# Patient Record
Sex: Female | Born: 1945
Health system: Southern US, Community
[De-identification: ages and names within clinical notes are randomized; demographics above are authoritative.]

## PROBLEM LIST (undated history)

## (undated) DIAGNOSIS — I2510 Atherosclerotic heart disease of native coronary artery without angina pectoris: Secondary | ICD-10-CM

## (undated) DIAGNOSIS — I4719 Other supraventricular tachycardia: Secondary | ICD-10-CM

## (undated) DIAGNOSIS — J449 Chronic obstructive pulmonary disease, unspecified: Secondary | ICD-10-CM

## (undated) DIAGNOSIS — I5042 Chronic combined systolic (congestive) and diastolic (congestive) heart failure: Secondary | ICD-10-CM

## (undated) DIAGNOSIS — I428 Other cardiomyopathies: Secondary | ICD-10-CM

## (undated) DIAGNOSIS — I340 Nonrheumatic mitral (valve) insufficiency: Secondary | ICD-10-CM

## (undated) DIAGNOSIS — I10 Essential (primary) hypertension: Secondary | ICD-10-CM

## (undated) DIAGNOSIS — I471 Supraventricular tachycardia: Secondary | ICD-10-CM

## (undated) DIAGNOSIS — R911 Solitary pulmonary nodule: Secondary | ICD-10-CM

## (undated) DIAGNOSIS — J45909 Unspecified asthma, uncomplicated: Secondary | ICD-10-CM

## (undated) HISTORY — DX: Supraventricular tachycardia: I47.1

## (undated) HISTORY — DX: Other supraventricular tachycardia: I47.19

## (undated) HISTORY — DX: Other cardiomyopathies: I42.8

## (undated) HISTORY — PX: REPLACEMENT TOTAL KNEE: SUR1224

## (undated) HISTORY — DX: Atherosclerotic heart disease of native coronary artery without angina pectoris: I25.10

## (undated) HISTORY — DX: Nonrheumatic mitral (valve) insufficiency: I34.0

## (undated) HISTORY — DX: Chronic combined systolic (congestive) and diastolic (congestive) heart failure: I50.42

---

## 2011-09-07 DIAGNOSIS — I8310 Varicose veins of unspecified lower extremity with inflammation: Secondary | ICD-10-CM | POA: Insufficient documentation

## 2011-10-11 DIAGNOSIS — G4700 Insomnia, unspecified: Secondary | ICD-10-CM | POA: Diagnosis not present

## 2011-10-11 DIAGNOSIS — I10 Essential (primary) hypertension: Secondary | ICD-10-CM | POA: Diagnosis not present

## 2011-10-11 DIAGNOSIS — R0602 Shortness of breath: Secondary | ICD-10-CM | POA: Diagnosis not present

## 2011-10-11 DIAGNOSIS — F329 Major depressive disorder, single episode, unspecified: Secondary | ICD-10-CM | POA: Diagnosis not present

## 2011-10-11 DIAGNOSIS — I5030 Unspecified diastolic (congestive) heart failure: Secondary | ICD-10-CM | POA: Diagnosis not present

## 2011-10-11 DIAGNOSIS — F411 Generalized anxiety disorder: Secondary | ICD-10-CM | POA: Diagnosis not present

## 2011-10-11 DIAGNOSIS — J441 Chronic obstructive pulmonary disease with (acute) exacerbation: Secondary | ICD-10-CM | POA: Diagnosis not present

## 2011-10-11 DIAGNOSIS — R0600 Dyspnea, unspecified: Secondary | ICD-10-CM | POA: Insufficient documentation

## 2011-10-12 DIAGNOSIS — E876 Hypokalemia: Secondary | ICD-10-CM | POA: Diagnosis not present

## 2011-10-12 DIAGNOSIS — F411 Generalized anxiety disorder: Secondary | ICD-10-CM | POA: Diagnosis not present

## 2011-10-12 DIAGNOSIS — I10 Essential (primary) hypertension: Secondary | ICD-10-CM | POA: Diagnosis not present

## 2011-10-12 DIAGNOSIS — G4700 Insomnia, unspecified: Secondary | ICD-10-CM | POA: Diagnosis not present

## 2011-10-12 DIAGNOSIS — F329 Major depressive disorder, single episode, unspecified: Secondary | ICD-10-CM | POA: Diagnosis not present

## 2011-10-12 DIAGNOSIS — I5032 Chronic diastolic (congestive) heart failure: Secondary | ICD-10-CM | POA: Diagnosis not present

## 2011-10-12 DIAGNOSIS — J441 Chronic obstructive pulmonary disease with (acute) exacerbation: Secondary | ICD-10-CM | POA: Diagnosis not present

## 2011-10-12 DIAGNOSIS — J9600 Acute respiratory failure, unspecified whether with hypoxia or hypercapnia: Secondary | ICD-10-CM | POA: Diagnosis not present

## 2011-10-13 DIAGNOSIS — F411 Generalized anxiety disorder: Secondary | ICD-10-CM | POA: Diagnosis not present

## 2011-10-13 DIAGNOSIS — J984 Other disorders of lung: Secondary | ICD-10-CM | POA: Diagnosis not present

## 2011-10-13 DIAGNOSIS — F17200 Nicotine dependence, unspecified, uncomplicated: Secondary | ICD-10-CM | POA: Diagnosis not present

## 2011-10-13 DIAGNOSIS — I5032 Chronic diastolic (congestive) heart failure: Secondary | ICD-10-CM | POA: Diagnosis not present

## 2011-10-13 DIAGNOSIS — G4700 Insomnia, unspecified: Secondary | ICD-10-CM | POA: Diagnosis not present

## 2011-10-13 DIAGNOSIS — F329 Major depressive disorder, single episode, unspecified: Secondary | ICD-10-CM | POA: Diagnosis not present

## 2011-10-13 DIAGNOSIS — I10 Essential (primary) hypertension: Secondary | ICD-10-CM | POA: Diagnosis not present

## 2011-10-13 DIAGNOSIS — J441 Chronic obstructive pulmonary disease with (acute) exacerbation: Secondary | ICD-10-CM | POA: Diagnosis not present

## 2011-11-20 DIAGNOSIS — J019 Acute sinusitis, unspecified: Secondary | ICD-10-CM | POA: Diagnosis not present

## 2011-11-20 DIAGNOSIS — J449 Chronic obstructive pulmonary disease, unspecified: Secondary | ICD-10-CM | POA: Diagnosis not present

## 2012-01-13 DIAGNOSIS — J449 Chronic obstructive pulmonary disease, unspecified: Secondary | ICD-10-CM | POA: Diagnosis not present

## 2012-01-13 DIAGNOSIS — I8310 Varicose veins of unspecified lower extremity with inflammation: Secondary | ICD-10-CM | POA: Diagnosis not present

## 2012-01-13 DIAGNOSIS — I509 Heart failure, unspecified: Secondary | ICD-10-CM | POA: Diagnosis not present

## 2012-02-01 DIAGNOSIS — M25559 Pain in unspecified hip: Secondary | ICD-10-CM | POA: Diagnosis not present

## 2012-02-01 DIAGNOSIS — M25469 Effusion, unspecified knee: Secondary | ICD-10-CM | POA: Diagnosis not present

## 2012-02-01 DIAGNOSIS — M5137 Other intervertebral disc degeneration, lumbosacral region: Secondary | ICD-10-CM | POA: Diagnosis not present

## 2012-02-01 DIAGNOSIS — M7989 Other specified soft tissue disorders: Secondary | ICD-10-CM | POA: Diagnosis not present

## 2012-02-01 DIAGNOSIS — R52 Pain, unspecified: Secondary | ICD-10-CM | POA: Diagnosis not present

## 2012-02-01 DIAGNOSIS — M47817 Spondylosis without myelopathy or radiculopathy, lumbosacral region: Secondary | ICD-10-CM | POA: Diagnosis not present

## 2012-02-01 DIAGNOSIS — M1710 Unilateral primary osteoarthritis, unspecified knee: Secondary | ICD-10-CM | POA: Diagnosis not present

## 2012-02-01 DIAGNOSIS — M25569 Pain in unspecified knee: Secondary | ICD-10-CM | POA: Diagnosis not present

## 2012-02-01 DIAGNOSIS — IMO0002 Reserved for concepts with insufficient information to code with codable children: Secondary | ICD-10-CM | POA: Diagnosis not present

## 2012-02-01 DIAGNOSIS — M25869 Other specified joint disorders, unspecified knee: Secondary | ICD-10-CM | POA: Diagnosis not present

## 2012-02-01 DIAGNOSIS — M51379 Other intervertebral disc degeneration, lumbosacral region without mention of lumbar back pain or lower extremity pain: Secondary | ICD-10-CM | POA: Diagnosis not present

## 2012-02-01 DIAGNOSIS — M79609 Pain in unspecified limb: Secondary | ICD-10-CM | POA: Diagnosis not present

## 2012-02-10 DIAGNOSIS — H6980 Other specified disorders of Eustachian tube, unspecified ear: Secondary | ICD-10-CM | POA: Diagnosis not present

## 2012-05-11 DIAGNOSIS — F411 Generalized anxiety disorder: Secondary | ICD-10-CM | POA: Diagnosis not present

## 2012-05-11 DIAGNOSIS — R05 Cough: Secondary | ICD-10-CM | POA: Diagnosis not present

## 2012-05-11 DIAGNOSIS — F17200 Nicotine dependence, unspecified, uncomplicated: Secondary | ICD-10-CM | POA: Diagnosis not present

## 2012-05-11 DIAGNOSIS — I509 Heart failure, unspecified: Secondary | ICD-10-CM | POA: Diagnosis not present

## 2012-05-11 DIAGNOSIS — Z79899 Other long term (current) drug therapy: Secondary | ICD-10-CM | POA: Diagnosis not present

## 2012-05-11 DIAGNOSIS — J449 Chronic obstructive pulmonary disease, unspecified: Secondary | ICD-10-CM | POA: Diagnosis not present

## 2012-06-28 DIAGNOSIS — Z23 Encounter for immunization: Secondary | ICD-10-CM | POA: Diagnosis not present

## 2012-09-14 DIAGNOSIS — F17200 Nicotine dependence, unspecified, uncomplicated: Secondary | ICD-10-CM | POA: Diagnosis not present

## 2012-09-14 DIAGNOSIS — J449 Chronic obstructive pulmonary disease, unspecified: Secondary | ICD-10-CM | POA: Diagnosis not present

## 2012-09-14 DIAGNOSIS — F411 Generalized anxiety disorder: Secondary | ICD-10-CM | POA: Diagnosis not present

## 2012-09-14 DIAGNOSIS — J069 Acute upper respiratory infection, unspecified: Secondary | ICD-10-CM | POA: Diagnosis not present

## 2012-09-14 DIAGNOSIS — I509 Heart failure, unspecified: Secondary | ICD-10-CM | POA: Diagnosis not present

## 2012-09-14 DIAGNOSIS — R5381 Other malaise: Secondary | ICD-10-CM | POA: Diagnosis not present

## 2012-09-14 DIAGNOSIS — K219 Gastro-esophageal reflux disease without esophagitis: Secondary | ICD-10-CM | POA: Diagnosis not present

## 2013-06-13 DIAGNOSIS — Z23 Encounter for immunization: Secondary | ICD-10-CM | POA: Diagnosis not present

## 2013-10-22 ENCOUNTER — Inpatient Hospital Stay: Payer: Self-pay | Admitting: Internal Medicine

## 2013-10-22 DIAGNOSIS — R5383 Other fatigue: Secondary | ICD-10-CM | POA: Diagnosis not present

## 2013-10-22 DIAGNOSIS — I10 Essential (primary) hypertension: Secondary | ICD-10-CM | POA: Diagnosis not present

## 2013-10-22 DIAGNOSIS — J9600 Acute respiratory failure, unspecified whether with hypoxia or hypercapnia: Secondary | ICD-10-CM | POA: Diagnosis not present

## 2013-10-22 DIAGNOSIS — F17200 Nicotine dependence, unspecified, uncomplicated: Secondary | ICD-10-CM | POA: Diagnosis not present

## 2013-10-22 DIAGNOSIS — R5381 Other malaise: Secondary | ICD-10-CM | POA: Diagnosis not present

## 2013-10-22 DIAGNOSIS — J441 Chronic obstructive pulmonary disease with (acute) exacerbation: Secondary | ICD-10-CM | POA: Diagnosis not present

## 2013-10-22 LAB — URINALYSIS, COMPLETE
Bacteria: NONE SEEN
Bilirubin,UR: NEGATIVE
Glucose,UR: NEGATIVE mg/dL (ref 0–75)
KETONE: NEGATIVE
Leukocyte Esterase: NEGATIVE
NITRITE: NEGATIVE
Ph: 6 (ref 4.5–8.0)
Protein: NEGATIVE
RBC,UR: 12 /HPF (ref 0–5)
Specific Gravity: 1.003 (ref 1.003–1.030)
Squamous Epithelial: 1
WBC UR: NONE SEEN /HPF (ref 0–5)

## 2013-10-22 LAB — CBC
HCT: 46.4 % (ref 35.0–47.0)
HGB: 15.2 g/dL (ref 12.0–16.0)
MCH: 29 pg (ref 26.0–34.0)
MCHC: 32.8 g/dL (ref 32.0–36.0)
MCV: 89 fL (ref 80–100)
Platelet: 123 10*3/uL — ABNORMAL LOW (ref 150–440)
RBC: 5.24 10*6/uL — AB (ref 3.80–5.20)
RDW: 14.6 % — ABNORMAL HIGH (ref 11.5–14.5)
WBC: 3.4 10*3/uL — AB (ref 3.6–11.0)

## 2013-10-22 LAB — BASIC METABOLIC PANEL
ANION GAP: 8 (ref 7–16)
BUN: 11 mg/dL (ref 7–18)
CALCIUM: 8.4 mg/dL — AB (ref 8.5–10.1)
CHLORIDE: 106 mmol/L (ref 98–107)
Co2: 26 mmol/L (ref 21–32)
Creatinine: 0.97 mg/dL (ref 0.60–1.30)
EGFR (Non-African Amer.): 60 — ABNORMAL LOW
Glucose: 95 mg/dL (ref 65–99)
OSMOLALITY: 279 (ref 275–301)
Potassium: 3.3 mmol/L — ABNORMAL LOW (ref 3.5–5.1)
Sodium: 140 mmol/L (ref 136–145)

## 2013-10-22 LAB — TROPONIN I: Troponin-I: <0.02 ng/mL

## 2013-10-23 DIAGNOSIS — J441 Chronic obstructive pulmonary disease with (acute) exacerbation: Secondary | ICD-10-CM | POA: Diagnosis not present

## 2013-10-23 DIAGNOSIS — J9600 Acute respiratory failure, unspecified whether with hypoxia or hypercapnia: Secondary | ICD-10-CM | POA: Diagnosis not present

## 2013-10-23 DIAGNOSIS — F17200 Nicotine dependence, unspecified, uncomplicated: Secondary | ICD-10-CM | POA: Diagnosis not present

## 2013-10-23 DIAGNOSIS — I10 Essential (primary) hypertension: Secondary | ICD-10-CM | POA: Diagnosis not present

## 2013-10-23 LAB — CBC WITH DIFFERENTIAL/PLATELET
Basophil #: 0 10*3/uL (ref 0.0–0.1)
Basophil %: 0.4 %
EOS ABS: 0 10*3/uL (ref 0.0–0.7)
Eosinophil %: 0 %
HCT: 42.1 % (ref 35.0–47.0)
HGB: 13.9 g/dL (ref 12.0–16.0)
LYMPHS ABS: 0.2 10*3/uL — AB (ref 1.0–3.6)
LYMPHS PCT: 11.4 %
MCH: 29.3 pg (ref 26.0–34.0)
MCHC: 33 g/dL (ref 32.0–36.0)
MCV: 89 fL (ref 80–100)
Monocyte #: 0.1 x10 3/mm — ABNORMAL LOW (ref 0.2–0.9)
Monocyte %: 3.4 %
Neutrophil #: 1.5 10*3/uL (ref 1.4–6.5)
Neutrophil %: 84.8 %
Platelet: 95 10*3/uL — ABNORMAL LOW (ref 150–440)
RBC: 4.74 10*6/uL (ref 3.80–5.20)
RDW: 14.2 % (ref 11.5–14.5)
WBC: 1.8 10*3/uL — AB (ref 3.6–11.0)

## 2013-10-23 LAB — BASIC METABOLIC PANEL
Anion Gap: 11 (ref 7–16)
BUN: 17 mg/dL (ref 7–18)
CALCIUM: 8.4 mg/dL — AB (ref 8.5–10.1)
Chloride: 105 mmol/L (ref 98–107)
Co2: 22 mmol/L (ref 21–32)
Creatinine: 1.13 mg/dL (ref 0.60–1.30)
GFR CALC AF AMER: 57 — AB
GFR CALC NON AF AMER: 50 — AB
GLUCOSE: 198 mg/dL — AB (ref 65–99)
OSMOLALITY: 283 (ref 275–301)
Potassium: 3.2 mmol/L — ABNORMAL LOW (ref 3.5–5.1)
SODIUM: 138 mmol/L (ref 136–145)

## 2013-10-24 LAB — CBC WITH DIFFERENTIAL/PLATELET
Basophil #: 0.1 10*3/uL (ref 0.0–0.1)
Basophil %: 0.6 %
EOS ABS: 0 10*3/uL (ref 0.0–0.7)
EOS PCT: 0 %
HCT: 44.7 % (ref 35.0–47.0)
HGB: 14.7 g/dL (ref 12.0–16.0)
LYMPHS ABS: 0.2 10*3/uL — AB (ref 1.0–3.6)
LYMPHS PCT: 2.3 %
MCH: 29.3 pg (ref 26.0–34.0)
MCHC: 32.9 g/dL (ref 32.0–36.0)
MCV: 89 fL (ref 80–100)
Monocyte #: 0.2 x10 3/mm (ref 0.2–0.9)
Monocyte %: 1.9 %
NEUTROS ABS: 9.8 10*3/uL — AB (ref 1.4–6.5)
Neutrophil %: 95.2 %
Platelet: 133 10*3/uL — ABNORMAL LOW (ref 150–440)
RBC: 5.01 10*6/uL (ref 3.80–5.20)
RDW: 14.6 % — ABNORMAL HIGH (ref 11.5–14.5)
WBC: 10.3 10*3/uL (ref 3.6–11.0)

## 2013-10-24 LAB — BASIC METABOLIC PANEL
Anion Gap: 6 — ABNORMAL LOW (ref 7–16)
BUN: 18 mg/dL (ref 7–18)
CO2: 26 mmol/L (ref 21–32)
Calcium, Total: 8.4 mg/dL — ABNORMAL LOW (ref 8.5–10.1)
Chloride: 107 mmol/L (ref 98–107)
Creatinine: 0.96 mg/dL (ref 0.60–1.30)
Glucose: 170 mg/dL — ABNORMAL HIGH (ref 65–99)
OSMOLALITY: 283 (ref 275–301)
Potassium: 4 mmol/L (ref 3.5–5.1)
Sodium: 139 mmol/L (ref 136–145)

## 2013-10-28 LAB — CULTURE, BLOOD (SINGLE)

## 2013-11-13 ENCOUNTER — Inpatient Hospital Stay: Payer: Self-pay | Admitting: Family Medicine

## 2013-11-13 DIAGNOSIS — R059 Cough, unspecified: Secondary | ICD-10-CM | POA: Diagnosis not present

## 2013-11-13 DIAGNOSIS — R52 Pain, unspecified: Secondary | ICD-10-CM | POA: Diagnosis not present

## 2013-11-13 DIAGNOSIS — I959 Hypotension, unspecified: Secondary | ICD-10-CM | POA: Diagnosis not present

## 2013-11-13 DIAGNOSIS — J189 Pneumonia, unspecified organism: Secondary | ICD-10-CM | POA: Diagnosis not present

## 2013-11-13 DIAGNOSIS — J984 Other disorders of lung: Secondary | ICD-10-CM | POA: Diagnosis not present

## 2013-11-13 DIAGNOSIS — R5383 Other fatigue: Secondary | ICD-10-CM | POA: Diagnosis not present

## 2013-11-13 DIAGNOSIS — J441 Chronic obstructive pulmonary disease with (acute) exacerbation: Secondary | ICD-10-CM | POA: Diagnosis not present

## 2013-11-13 DIAGNOSIS — R6510 Systemic inflammatory response syndrome (SIRS) of non-infectious origin without acute organ dysfunction: Secondary | ICD-10-CM | POA: Diagnosis not present

## 2013-11-13 DIAGNOSIS — F17200 Nicotine dependence, unspecified, uncomplicated: Secondary | ICD-10-CM | POA: Diagnosis not present

## 2013-11-13 LAB — HEPATIC FUNCTION PANEL A (ARMC)
ALBUMIN: 2.3 g/dL — AB (ref 3.4–5.0)
AST: 8 U/L — AB (ref 15–37)
Alkaline Phosphatase: 70 U/L
BILIRUBIN TOTAL: 0.8 mg/dL (ref 0.2–1.0)
Bilirubin, Direct: 0.2 mg/dL (ref 0.00–0.20)
SGPT (ALT): 10 U/L — ABNORMAL LOW (ref 12–78)
TOTAL PROTEIN: 6 g/dL — AB (ref 6.4–8.2)

## 2013-11-13 LAB — URINALYSIS, COMPLETE
BILIRUBIN, UR: NEGATIVE
Bacteria: NONE SEEN
GLUCOSE, UR: NEGATIVE mg/dL (ref 0–75)
Ketone: NEGATIVE
LEUKOCYTE ESTERASE: NEGATIVE
Nitrite: NEGATIVE
PH: 5 (ref 4.5–8.0)
PROTEIN: NEGATIVE
SPECIFIC GRAVITY: 1.012 (ref 1.003–1.030)
Squamous Epithelial: <1
WBC UR: 1 /HPF (ref 0–5)

## 2013-11-13 LAB — CK TOTAL AND CKMB (NOT AT ARMC)
CK, Total: 80 U/L
CK, Total: 73 U/L
CK-MB: 0.6 ng/mL (ref 0.5–3.6)
CK-MB: 1 ng/mL (ref 0.5–3.6)

## 2013-11-13 LAB — BASIC METABOLIC PANEL
Anion Gap: 6 — ABNORMAL LOW (ref 7–16)
BUN: 15 mg/dL (ref 7–18)
CO2: 26 mmol/L (ref 21–32)
Calcium, Total: 7.3 mg/dL — ABNORMAL LOW (ref 8.5–10.1)
Chloride: 109 mmol/L — ABNORMAL HIGH (ref 98–107)
Creatinine: 1.01 mg/dL (ref 0.60–1.30)
EGFR (African American): >60
GFR CALC NON AF AMER: 57 — AB
Glucose: 117 mg/dL — ABNORMAL HIGH (ref 65–99)
Osmolality: 283 (ref 275–301)
Potassium: 3.5 mmol/L (ref 3.5–5.1)
SODIUM: 141 mmol/L (ref 136–145)

## 2013-11-13 LAB — PROTIME-INR
INR: 1.2
Prothrombin Time: 15.1 secs — ABNORMAL HIGH (ref 11.5–14.7)

## 2013-11-13 LAB — CBC WITH DIFFERENTIAL/PLATELET
BASOS PCT: 0.4 %
Basophil #: 0 10*3/uL (ref 0.0–0.1)
Eosinophil #: 0 10*3/uL (ref 0.0–0.7)
Eosinophil %: 0.3 %
HCT: 37.4 % (ref 35.0–47.0)
HGB: 12.3 g/dL (ref 12.0–16.0)
LYMPHS ABS: 1 10*3/uL (ref 1.0–3.6)
Lymphocyte %: 10 %
MCH: 28.9 pg (ref 26.0–34.0)
MCHC: 32.8 g/dL (ref 32.0–36.0)
MCV: 88 fL (ref 80–100)
MONOS PCT: 7.6 %
Monocyte #: 0.7 x10 3/mm (ref 0.2–0.9)
Neutrophil #: 8 10*3/uL — ABNORMAL HIGH (ref 1.4–6.5)
Neutrophil %: 81.7 %
Platelet: 169 10*3/uL (ref 150–440)
RBC: 4.24 10*6/uL (ref 3.80–5.20)
RDW: 14.1 % (ref 11.5–14.5)
WBC: 9.8 10*3/uL (ref 3.6–11.0)

## 2013-11-13 LAB — TROPONIN I

## 2013-11-13 LAB — APTT: ACTIVATED PTT: 29 s (ref 23.6–35.9)

## 2013-11-13 LAB — HEMOGLOBIN A1C: Hemoglobin A1C: 6.1 % (ref 4.2–6.3)

## 2013-11-14 DIAGNOSIS — F17200 Nicotine dependence, unspecified, uncomplicated: Secondary | ICD-10-CM | POA: Diagnosis not present

## 2013-11-14 DIAGNOSIS — R6510 Systemic inflammatory response syndrome (SIRS) of non-infectious origin without acute organ dysfunction: Secondary | ICD-10-CM | POA: Diagnosis not present

## 2013-11-14 DIAGNOSIS — A779 Spotted fever, unspecified: Secondary | ICD-10-CM | POA: Diagnosis not present

## 2013-11-14 DIAGNOSIS — I959 Hypotension, unspecified: Secondary | ICD-10-CM | POA: Diagnosis not present

## 2013-11-14 DIAGNOSIS — I059 Rheumatic mitral valve disease, unspecified: Secondary | ICD-10-CM

## 2013-11-14 DIAGNOSIS — J189 Pneumonia, unspecified organism: Secondary | ICD-10-CM | POA: Diagnosis not present

## 2013-11-14 LAB — CBC WITH DIFFERENTIAL/PLATELET
BASOS ABS: 0 10*3/uL (ref 0.0–0.1)
Basophil %: 0.2 %
EOS PCT: 0 %
Eosinophil #: 0 10*3/uL (ref 0.0–0.7)
HCT: 35.8 % (ref 35.0–47.0)
HGB: 12 g/dL (ref 12.0–16.0)
Lymphocyte #: 0.4 10*3/uL — ABNORMAL LOW (ref 1.0–3.6)
Lymphocyte %: 7 %
MCH: 29.3 pg (ref 26.0–34.0)
MCHC: 33.6 g/dL (ref 32.0–36.0)
MCV: 87 fL (ref 80–100)
Monocyte #: 0.1 x10 3/mm — ABNORMAL LOW (ref 0.2–0.9)
Monocyte %: 1.9 %
Neutrophil #: 5.5 10*3/uL (ref 1.4–6.5)
Neutrophil %: 90.9 %
Platelet: 161 10*3/uL (ref 150–440)
RBC: 4.11 10*6/uL (ref 3.80–5.20)
RDW: 13.7 % (ref 11.5–14.5)
WBC: 6 10*3/uL (ref 3.6–11.0)

## 2013-11-14 LAB — COMPREHENSIVE METABOLIC PANEL
ALT: 11 U/L — AB (ref 12–78)
ANION GAP: 9 (ref 7–16)
AST: 11 U/L — AB (ref 15–37)
Albumin: 2.4 g/dL — ABNORMAL LOW (ref 3.4–5.0)
Alkaline Phosphatase: 74 U/L
BUN: 12 mg/dL (ref 7–18)
Bilirubin,Total: 0.5 mg/dL (ref 0.2–1.0)
CREATININE: 0.84 mg/dL (ref 0.60–1.30)
Calcium, Total: 7.9 mg/dL — ABNORMAL LOW (ref 8.5–10.1)
Chloride: 110 mmol/L — ABNORMAL HIGH (ref 98–107)
Co2: 22 mmol/L (ref 21–32)
EGFR (African American): >60
EGFR (Non-African Amer.): >60
GLUCOSE: 143 mg/dL — AB (ref 65–99)
Osmolality: 283 (ref 275–301)
Potassium: 3.6 mmol/L (ref 3.5–5.1)
Sodium: 141 mmol/L (ref 136–145)
TOTAL PROTEIN: 6.1 g/dL — AB (ref 6.4–8.2)

## 2013-11-15 DIAGNOSIS — A779 Spotted fever, unspecified: Secondary | ICD-10-CM | POA: Diagnosis not present

## 2013-11-15 DIAGNOSIS — F17200 Nicotine dependence, unspecified, uncomplicated: Secondary | ICD-10-CM | POA: Diagnosis not present

## 2013-11-15 DIAGNOSIS — R6510 Systemic inflammatory response syndrome (SIRS) of non-infectious origin without acute organ dysfunction: Secondary | ICD-10-CM | POA: Diagnosis not present

## 2013-11-15 DIAGNOSIS — J189 Pneumonia, unspecified organism: Secondary | ICD-10-CM | POA: Diagnosis not present

## 2013-11-15 DIAGNOSIS — I959 Hypotension, unspecified: Secondary | ICD-10-CM | POA: Diagnosis not present

## 2013-11-15 LAB — CBC WITH DIFFERENTIAL/PLATELET
BASOS ABS: 0 10*3/uL (ref 0.0–0.1)
Basophil %: 0.2 %
Eosinophil #: 0 10*3/uL (ref 0.0–0.7)
Eosinophil %: 0 %
HCT: 34.8 % — AB (ref 35.0–47.0)
HGB: 11.4 g/dL — ABNORMAL LOW (ref 12.0–16.0)
Lymphocyte #: 0.5 10*3/uL — ABNORMAL LOW (ref 1.0–3.6)
Lymphocyte %: 7.4 %
MCH: 28.7 pg (ref 26.0–34.0)
MCHC: 32.6 g/dL (ref 32.0–36.0)
MCV: 88 fL (ref 80–100)
MONO ABS: 0.2 x10 3/mm (ref 0.2–0.9)
MONOS PCT: 2.7 %
NEUTROS ABS: 5.5 10*3/uL (ref 1.4–6.5)
Neutrophil %: 89.7 %
Platelet: 159 10*3/uL (ref 150–440)
RBC: 3.96 10*6/uL (ref 3.80–5.20)
RDW: 13.8 % (ref 11.5–14.5)
WBC: 6.2 10*3/uL (ref 3.6–11.0)

## 2013-11-15 LAB — BASIC METABOLIC PANEL
Anion Gap: 5 — ABNORMAL LOW (ref 7–16)
BUN: 9 mg/dL (ref 7–18)
CALCIUM: 7.8 mg/dL — AB (ref 8.5–10.1)
CHLORIDE: 110 mmol/L — AB (ref 98–107)
CREATININE: 0.7 mg/dL (ref 0.60–1.30)
Co2: 25 mmol/L (ref 21–32)
Glucose: 142 mg/dL — ABNORMAL HIGH (ref 65–99)
Osmolality: 281 (ref 275–301)
POTASSIUM: 3.8 mmol/L (ref 3.5–5.1)
Sodium: 140 mmol/L (ref 136–145)

## 2013-11-15 LAB — URINE CULTURE

## 2013-11-16 DIAGNOSIS — R6510 Systemic inflammatory response syndrome (SIRS) of non-infectious origin without acute organ dysfunction: Secondary | ICD-10-CM | POA: Diagnosis not present

## 2013-11-16 DIAGNOSIS — I959 Hypotension, unspecified: Secondary | ICD-10-CM | POA: Diagnosis not present

## 2013-11-16 DIAGNOSIS — F17200 Nicotine dependence, unspecified, uncomplicated: Secondary | ICD-10-CM | POA: Diagnosis not present

## 2013-11-16 DIAGNOSIS — J189 Pneumonia, unspecified organism: Secondary | ICD-10-CM | POA: Diagnosis not present

## 2013-11-16 LAB — BASIC METABOLIC PANEL
Anion Gap: 8 (ref 7–16)
BUN: 11 mg/dL (ref 7–18)
CALCIUM: 8.1 mg/dL — AB (ref 8.5–10.1)
Chloride: 107 mmol/L (ref 98–107)
Co2: 25 mmol/L (ref 21–32)
Creatinine: 0.81 mg/dL (ref 0.60–1.30)
EGFR (Non-African Amer.): >60
Glucose: 134 mg/dL — ABNORMAL HIGH (ref 65–99)
Osmolality: 281 (ref 275–301)
POTASSIUM: 3.6 mmol/L (ref 3.5–5.1)
SODIUM: 140 mmol/L (ref 136–145)

## 2013-11-17 DIAGNOSIS — F17200 Nicotine dependence, unspecified, uncomplicated: Secondary | ICD-10-CM | POA: Diagnosis not present

## 2013-11-17 DIAGNOSIS — J189 Pneumonia, unspecified organism: Secondary | ICD-10-CM | POA: Diagnosis not present

## 2013-11-17 DIAGNOSIS — I959 Hypotension, unspecified: Secondary | ICD-10-CM | POA: Diagnosis not present

## 2013-11-17 DIAGNOSIS — R6510 Systemic inflammatory response syndrome (SIRS) of non-infectious origin without acute organ dysfunction: Secondary | ICD-10-CM | POA: Diagnosis not present

## 2013-11-18 LAB — EXPECTORATED SPUTUM ASSESSMENT W GRAM STAIN, RFLX TO RESP C

## 2013-11-18 LAB — CULTURE, BLOOD (SINGLE)

## 2014-04-21 ENCOUNTER — Emergency Department: Payer: Self-pay | Admitting: Emergency Medicine

## 2014-04-21 LAB — URINALYSIS, COMPLETE
Bacteria: NONE SEEN
Bilirubin,UR: NEGATIVE
Glucose,UR: NEGATIVE mg/dL (ref 0–75)
Ketone: NEGATIVE
Leukocyte Esterase: NEGATIVE
NITRITE: NEGATIVE
PH: 7 (ref 4.5–8.0)
Protein: NEGATIVE
Specific Gravity: 1.009 (ref 1.003–1.030)
Squamous Epithelial: 4
WBC UR: 1 /HPF (ref 0–5)

## 2014-04-21 LAB — CBC
HCT: 40.6 % (ref 35.0–47.0)
HGB: 13.3 g/dL (ref 12.0–16.0)
MCH: 28.2 pg (ref 26.0–34.0)
MCHC: 32.8 g/dL (ref 32.0–36.0)
MCV: 86 fL (ref 80–100)
PLATELETS: 194 10*3/uL (ref 150–440)
RBC: 4.72 10*6/uL (ref 3.80–5.20)
RDW: 14.2 % (ref 11.5–14.5)
WBC: 8.9 10*3/uL (ref 3.6–11.0)

## 2014-04-21 LAB — BASIC METABOLIC PANEL
Anion Gap: 5 — ABNORMAL LOW (ref 7–16)
BUN: 7 mg/dL (ref 7–18)
CALCIUM: 8.2 mg/dL — AB (ref 8.5–10.1)
CHLORIDE: 105 mmol/L (ref 98–107)
CO2: 29 mmol/L (ref 21–32)
CREATININE: 0.68 mg/dL (ref 0.60–1.30)
EGFR (Non-African Amer.): >60
GLUCOSE: 85 mg/dL (ref 65–99)
Osmolality: 275 (ref 275–301)
Potassium: 3.1 mmol/L — ABNORMAL LOW (ref 3.5–5.1)
Sodium: 139 mmol/L (ref 136–145)

## 2014-04-21 LAB — PRO B NATRIURETIC PEPTIDE: B-Type Natriuretic Peptide: 764 pg/mL — ABNORMAL HIGH (ref 0–125)

## 2014-04-21 LAB — TROPONIN I: Troponin-I: <0.02 ng/mL

## 2014-05-02 ENCOUNTER — Emergency Department: Payer: Self-pay | Admitting: Emergency Medicine

## 2014-05-02 DIAGNOSIS — M436 Torticollis: Secondary | ICD-10-CM | POA: Diagnosis not present

## 2014-05-02 DIAGNOSIS — K088 Other specified disorders of teeth and supporting structures: Secondary | ICD-10-CM | POA: Diagnosis not present

## 2014-05-02 DIAGNOSIS — Z720 Tobacco use: Secondary | ICD-10-CM | POA: Diagnosis not present

## 2014-05-02 DIAGNOSIS — M542 Cervicalgia: Secondary | ICD-10-CM | POA: Diagnosis not present

## 2014-07-21 ENCOUNTER — Emergency Department: Payer: Self-pay | Admitting: Emergency Medicine

## 2014-07-22 ENCOUNTER — Ambulatory Visit: Payer: Self-pay | Admitting: Physician Assistant

## 2014-07-22 DIAGNOSIS — J40 Bronchitis, not specified as acute or chronic: Secondary | ICD-10-CM | POA: Diagnosis not present

## 2014-07-22 DIAGNOSIS — T310 Burns involving less than 10% of body surface: Secondary | ICD-10-CM | POA: Diagnosis not present

## 2014-07-22 DIAGNOSIS — T23201A Burn of second degree of right hand, unspecified site, initial encounter: Secondary | ICD-10-CM | POA: Diagnosis not present

## 2014-07-22 DIAGNOSIS — X088XXA Exposure to other specified smoke, fire and flames, initial encounter: Secondary | ICD-10-CM | POA: Diagnosis not present

## 2014-07-25 ENCOUNTER — Ambulatory Visit: Payer: Self-pay | Admitting: Family Medicine

## 2014-07-25 DIAGNOSIS — X088XXA Exposure to other specified smoke, fire and flames, initial encounter: Secondary | ICD-10-CM | POA: Diagnosis not present

## 2014-07-25 DIAGNOSIS — T23231A Burn of second degree of multiple right fingers (nail), not including thumb, initial encounter: Secondary | ICD-10-CM | POA: Diagnosis not present

## 2014-07-25 DIAGNOSIS — T310 Burns involving less than 10% of body surface: Secondary | ICD-10-CM | POA: Diagnosis not present

## 2014-07-25 DIAGNOSIS — F17200 Nicotine dependence, unspecified, uncomplicated: Secondary | ICD-10-CM | POA: Diagnosis not present

## 2014-09-12 ENCOUNTER — Ambulatory Visit: Payer: Self-pay | Admitting: Emergency Medicine

## 2014-09-12 DIAGNOSIS — Z79899 Other long term (current) drug therapy: Secondary | ICD-10-CM | POA: Diagnosis not present

## 2014-09-12 DIAGNOSIS — J441 Chronic obstructive pulmonary disease with (acute) exacerbation: Secondary | ICD-10-CM | POA: Diagnosis not present

## 2014-09-12 DIAGNOSIS — J42 Unspecified chronic bronchitis: Secondary | ICD-10-CM | POA: Diagnosis not present

## 2014-09-12 DIAGNOSIS — F17210 Nicotine dependence, cigarettes, uncomplicated: Secondary | ICD-10-CM | POA: Diagnosis not present

## 2014-10-23 ENCOUNTER — Emergency Department
Admit: 2014-10-23 | Disposition: A | Discharge status: Home / Self Care | Payer: Self-pay | Admitting: Emergency Medicine

## 2014-10-23 DIAGNOSIS — R0789 Other chest pain: Secondary | ICD-10-CM | POA: Diagnosis not present

## 2014-10-23 DIAGNOSIS — J441 Chronic obstructive pulmonary disease with (acute) exacerbation: Secondary | ICD-10-CM | POA: Diagnosis not present

## 2014-10-23 DIAGNOSIS — Z720 Tobacco use: Secondary | ICD-10-CM | POA: Diagnosis not present

## 2014-10-23 DIAGNOSIS — R51 Headache: Secondary | ICD-10-CM | POA: Diagnosis not present

## 2014-10-23 DIAGNOSIS — R000 Tachycardia, unspecified: Secondary | ICD-10-CM | POA: Diagnosis not present

## 2014-10-23 DIAGNOSIS — R079 Chest pain, unspecified: Secondary | ICD-10-CM | POA: Diagnosis not present

## 2014-10-23 DIAGNOSIS — M25512 Pain in left shoulder: Secondary | ICD-10-CM | POA: Diagnosis not present

## 2014-10-23 DIAGNOSIS — R42 Dizziness and giddiness: Secondary | ICD-10-CM | POA: Diagnosis not present

## 2014-10-23 DIAGNOSIS — R0602 Shortness of breath: Secondary | ICD-10-CM | POA: Diagnosis not present

## 2014-10-23 DIAGNOSIS — R062 Wheezing: Secondary | ICD-10-CM | POA: Diagnosis not present

## 2014-10-23 DIAGNOSIS — R110 Nausea: Secondary | ICD-10-CM | POA: Diagnosis not present

## 2014-10-23 DIAGNOSIS — R05 Cough: Secondary | ICD-10-CM | POA: Diagnosis not present

## 2014-10-23 LAB — COMPREHENSIVE METABOLIC PANEL
ALBUMIN: 3.6 g/dL
ALT: 9 U/L — AB
Alkaline Phosphatase: 81 U/L
Anion Gap: 5 — ABNORMAL LOW (ref 7–16)
BUN: 12 mg/dL
Bilirubin,Total: 0.4 mg/dL
CALCIUM: 8.5 mg/dL — AB
Chloride: 110 mmol/L
Co2: 27 mmol/L
Creatinine: 0.67 mg/dL
EGFR (Non-African Amer.): >60
Glucose: 88 mg/dL
Potassium: 4 mmol/L
SGOT(AST): 11 U/L — ABNORMAL LOW
SODIUM: 142 mmol/L
Total Protein: 6.2 g/dL — ABNORMAL LOW

## 2014-10-23 LAB — CBC
HCT: 42.8 % (ref 35.0–47.0)
HGB: 13.8 g/dL (ref 12.0–16.0)
MCH: 28.2 pg (ref 26.0–34.0)
MCHC: 32.2 g/dL (ref 32.0–36.0)
MCV: 87 fL (ref 80–100)
Platelet: 161 10*3/uL (ref 150–440)
RBC: 4.9 10*6/uL (ref 3.80–5.20)
RDW: 15.2 % — AB (ref 11.5–14.5)
WBC: 6.9 10*3/uL (ref 3.6–11.0)

## 2014-10-23 LAB — TROPONIN I: Troponin-I: <0.03 ng/mL

## 2014-10-23 LAB — CK TOTAL AND CKMB (NOT AT ARMC)
CK, TOTAL: 57 U/L
CK-MB: 1.8 ng/mL

## 2014-10-24 LAB — PRO B NATRIURETIC PEPTIDE: B-TYPE NATIURETIC PEPTID: 147 pg/mL — AB

## 2014-11-12 DIAGNOSIS — J449 Chronic obstructive pulmonary disease, unspecified: Secondary | ICD-10-CM | POA: Diagnosis not present

## 2014-11-12 DIAGNOSIS — G4700 Insomnia, unspecified: Secondary | ICD-10-CM | POA: Diagnosis not present

## 2014-11-12 DIAGNOSIS — F17210 Nicotine dependence, cigarettes, uncomplicated: Secondary | ICD-10-CM | POA: Diagnosis not present

## 2014-11-12 DIAGNOSIS — R0602 Shortness of breath: Secondary | ICD-10-CM | POA: Diagnosis not present

## 2014-11-17 NOTE — Discharge Summary (Signed)
PATIENT NAME:  [name] [name], [name] [name] MR#:  [id number] DATE OF BIRTH:  1946-05-11  DATE OF ADMISSION:  10/22/2013 DATE OF DISCHARGE:  10/24/2013  ADMITTING DIAGNOSIS: Acute shortness of breath.   DISCHARGE DIAGNOSES: 1. Shortness of breath due to acute respiratory failure due to chronic obstructive pulmonary disease exacerbation and bronchitis, now symptoms improved.  2. History of congestive heart failure type unknown. Currently compensated. No evidence of exacerbation.  3. Hypertension.  4. Tobacco abuse.  5. Status post hysterectomy.  6. Status post knee surgery.  7. Leukopenia of unclear etiology resolved, this morning.  8. Thrombocytopenia of unclear etiology improved, again, this morning.   PERTINENT LABS AND EVALUATIONS: Admitting EKG was normal sinus rhythm without any ST-T wave changes. PA and lateral chest x-ray showed no active cardiopulmonary processes. Urinalysis was negative. WBC count was 3.4, hemoglobin 15.2, platelet count was 123. Troponin less than 0.02. Glucose 95, BUN 11, creatinine 0.97, sodium 140, potassium was 3.3, chloride 106, CO2 was 26, calcium was 8.4 Most recent BMP today shows a potassium of 4,  creatinine 0.96. Her WBC count at discharge was 10.3, hemoglobin 14.7, platelet count 133.   HOSPITAL COURSE: Please refer to H and P done by the admitting physician. The patient is a 69 year old white female with history of previous COPD, congestive heart failure presented with complaint of shortness of breath, generalized body aches. The patient was noted to have saturations on 86 on room air. Due to this she was admitted for further evaluation and treatment. She was thought to have acute exacerbation of her COPD. Without any evidence of pneumonia or CHF. She was noted to have likely bronchitis based on her symptoms. The patient was treated with antibiotics with significant improvement in her symptoms. She was doing much better on discharge. Still had some cough but her shortness of  breath is mostly resolved. She was ambulated and did well with oxygen saturations. She does have a history of CHF, but there was no evidence of CHF. She also was noted to have slightly decreased WBC count and thrombocytopenia of unclear cause; however, on the day of discharge these also improved. At this time, she is doing much better and is stable for discharge to home.   DISCHARGE MEDICATIONS: Omeprazole 40 daily, Bystolic 0.5 mg 1 tab p.o. daily, cyanocobalamin 100 mcg q. monthly, alprazolam 0.5 mg 1 tab p.o. b.i.d. Bayer aspirin 325 daily, Nitrostat 0.4 sublingual p.r.n., benzonatate  100 mg q.6 p.r.n., Advair 250/50 1 puff b.i.d., Spiriva 18 mcg daily, guaifenesin  600 1 tabs p.o. b.i.d., levofloxacin 750 mg 1 tab p.o. q. 48 hours x 2 days, prednisone taper starting at 60 mg, taper by 10 until complete, albuterol ipratropium 4 times a day as needed for shortness of breath. Due to low blood pressure the patient is requested to hold amlodipine, benazepril and Lasix until further instructed by her primary care provider.   DIET: Low-sodium, low-fat, low-cholesterol.   ACTIVITY: As tolerated.   FOLLOW-UP: With primary M.D. in 1 to 2 weeks. Check CBC, BMP at visit with primary care provider. Make sure that her WBC and platelet count are stable.   TIME SPENT: 35 minutes.    ____________________________ Lacie Scotts Allena Katz, MD shp:sg D: 10/25/2013 09:14:40 ET T: 10/25/2013 09:33:55 ET JOB#: [id number]  cc: Bannie Lobban H. Allena Katz, MD, <Dictator> Charise Carwin MD ELECTRONICALLY SIGNED 11/03/2013 8:48
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
## 2014-11-17 NOTE — Discharge Summary (Signed)
ADDENDUM:  PATIENT NAME:  [name] [name] MR#:  [id number] DATE OF BIRTH:  Sep 28, 1945  DATE OF ADMISSION:  11/13/2013 DATE OF DISCHARGE: 11/17/2013   MEDICATIONS: Prednisone 10 mg tablets taper starting at 6 tablets coming down 1 tablet every day until gone, Tramadol 50 mg every 6 hours as needed for pain, fluconazole 100 mg once a day for 5 days, nystatin 5 mL every 6 hours as needed for thrush, senna one tablet twice daily, amoxicillin with clavulanic 875 mg/125 mg twice daily for 9 days, doxycycline 100 mg twice a day for the next 2 days, Tussionex 5 mL every 12 hours as needed for cough, cyclobenzaprine 5 mg tablets every 6 hours as needed for pain and muscle contracture.   FOLLOWUP: Primary care physician in the next 1 to 2 weeks.   HOSPITAL COURSE: This is a very nice 69 year old female, who was admitted with a chief complaint of shortness of breath. The patient had a recent admission for acute respiratory failure due to COPD. At that time, she was noted to have significant leukopenia and thrombocytopenia. She was treated with Levaquin for bronchitis and sent back home. Two weeks ago, she started to have significant weakness and started falling. She had a couple of presyncopal episodes while getting up in the middle of the night and started to have significant phlegm. For the past 2 days, she has right-sided rib pain due to the cough and presented to the Emergency Department, where her oxygen saturations were down to 88%. The patient was put on 2 L and she improved. She was  noted to have systolic blood pressure of 60/40. She looked dehydrated. IV fluids were given. The patient admitted during the hospitalization, that she has been falling significantly. The patient was admitted for treatment of the pneumonia and systemic inflammatory response syndrome.  As per problems:  1.  Systemic inflammatory response syndrome. The patient had significant hypotension, which was concerning for severe sepsis. Sputum  cultures were ordered. Broad-spectrum antibiotics were ordered. The patient was put on doxycycline and Zosyn. Since the patient had leukopenia and thrombocytopenia trigger, the thought that she might have pick borne illness and the labs were sent for [facility] Spotted Fever Ehrlichia and the serology actually came up IgM positive for [facility] Spotted Fever. We consulted ID Dr. Sampson Goon, who evaluated the patient. He believes that all this is secondary to acute infection that happened last time that she was here, but now at this moment it is resolved.  2.  As far as her pneumonia, she has a right-sided pneumonia with pleurisy. Antibiotics were given as mentioned above, Zosyn and doxycycline. She is going to be discharged with doxycycline for a couple more of days and Augmentin to complete a total of 14 days.  3.  The patient has chronic obstructive pulmonary disease. With Solu-Medrol, Advair Diskus and tiotropium, she got better quickly. Right now she is going to be discharged on a taper for prednisone.  4.  As far as her hypotension, it resolved after IV fluids. Her blood pressure medications were held, but right now her blood pressure normalized and she can start taking it again. Take Lasix as needed as the patient has history of diastolic dysfunction.  5.  Her hyperglycemia was likely due to dehydration. The patient had a hemoglobin A1c of 6.1. 6.  As far as her syncopal episodes, the patient usually takes 2 to 3 tablets of Lasix as needed for swelling and when she takes this, she could get  dehydrated easily for what her dose has been reduced to only 1 at a time.  7.  Sputum culture was done for her problems and showed moderate yeast. The patient had actually thrush and we treated with Diflucan.  8.  As far as her other medical problems, they were stable during this hospitalization. She is able to be discharged home today in good condition.   TIME SPENT: I spent about 45 minutes with this  discharge.  ____________________________ Felipa Furnace, MD rsg:aw D: 11/17/2013 11:35:55 ET T: 11/17/2013 12:13:46 ET JOB#: [id number]  cc: Felipa Furnace, MD, <Dictator> Kynslei Art Juanda Chance MD ELECTRONICALLY SIGNED 11/28/2013 23:14

## 2014-11-17 NOTE — H&P (Signed)
PATIENT NAME:  [name] [name], [name] [name] MR#:  [id number] DATE OF BIRTH:  Dec 27, 1945  DATE OF ADMISSION:  11/13/2013  PRIMARY CARE PHYSICIAN: Dr. Laverna Peace from [facility]  HISTORY OF PRESENT ILLNESS: The patient is a 69 year old Caucasian female with past medical history significant for history of recent admission for acute respiratory failure due to COPD exacerbation. At that time, she was noted to have leukopenia as well as thrombocytopenia. She was treated with Levaquin for bronchitis and was sent back to home. Approximately 2 weeks ago, she came back home and she stated that over the past 2 weeks she has been having significant weakness as well as falling, feeling presyncopal and intermittently maybe even syncopizing. Has been coughing and having some clear phlegm. Over the past 2 days, she has been noticing right-sided rib as well as back pain. She also decreased her appetite. EMS brought her with hypoxia. Her O2 sats were 88% on room air and went up to 96% on 2 liters of oxygen through nasal cannula. In the Emergency Room, she was noted to have right-sided pneumonia on chest x-ray and hospitalist services were contacted for admission. She was also severely profoundly hypotensive with systolic blood pressure of 60/40. She was given IV fluids after which the patient's blood pressure is somewhat better to 99 now she feels a little bit better at present, however admits of multiple falls at home.   PAST MEDICAL HISTORY: Significant for history of recent admission on 29th of March 2015 through 31st of March 2015 for acute respiratory failure due to COPD exacerbation. At that time, the patient had leukopenia as well as thrombocytopenia of unclear etiology which resolved. She was discharged on Levaquin to home. Also history of CHF with unknown ejection fraction, history of COPD, hypertension, as well as ongoing tobacco abuse.  PAST SURGICAL HISTORY: Hysterectomy as well as knee surgery.    SOCIAL HISTORY: The patient lives alone, smokes a pack a day. No alcohol. No illicit drugs.    FAMILY HISTORY: No early coronary artery disease or cancers.   REVIEW OF SYSTEMS: Positive for feeling feverish as well as chilly, having chills at home, fatigue and weak, pains in the right rib area for the past 2 days. The pain was described as sharp, constant, and increasing with deep breath. Also some blurring of vision, some sinus congestion intermittently as well as burning ball inside of her chest, especially whenever she coughs, some wheezes as well as shortness of breath, feeling presyncopal. CONSTITUTIONAL: Denies high fevers, weight loss or gain.  EYES: Denies any double vision, glaucoma or cataracts.  EARS, NOSE, THROAT: No tinnitus, allergies, epistaxis, sinus pain, dentures, difficulty swallowing. RESPIRATORY: Denies any hemoptysis, COPD. Admits of COPD. CARDIOVASCULAR: Admits of chest pain only with cough, however, denies any chest pain whenever she exerts herself. No orthopnea, edema, arrhythmias or palpitations. Admits of feeling presyncopal as well as syncopizing at home and falls. GASTROINTESTINAL: Denies nausea, vomiting, diarrhea or constipation. Admits of poor appetite. No rectal bleeding, change in bowel habits.  GENITOURINARY: Denies dysuria, hematuria, frequency, or incontinence. ENDOCRINOLOGY: Denies polydipsia, nocturia, thyroid problems, heat or cold intolerance or thirst.  HEMATOLOGIC: Denies anemia, easy bruising, bleeding, or swollen glands. SKIN: Denies any acne, rashes, change in moles.  MUSCULOSKELETAL: Denies arthritis, cramps, swelling or gout. NEUROLOGICAL: Denies epilepsy or tremor.  PSYCHIATRIC: Denies anxiety, insomnia, depression.   PHYSICAL EXAMINATION: VITAL SIGNS: On arrival to the hospital, temperature is 97.6, pulse 92, respirations 18, blood pressure 62/40, and saturation  was 93% on oxygen therapy.  GENERAL: During my evaluation, the patient is alert,  oriented to time, person and place, cooperative. Memory is good. No significant confusion, agitation, or depression. The patient is agitated somewhat and uncomfortable, restless on the stretcher.  HEENT: Pupils are equal and reactive to light. Extraocular movements intact. No icterus or conjunctivitis. Has normal hearing. No pharyngeal edema. Mucosa is moist.  NECK: No masses. Supple and nontender. Thyroid is not enlarged. No adenopathy. No JVD or carotid bruits bilaterally. Full range of motion.  LUNGS: Mildly diminished breath sounds bilaterally. Rales, rhonchi, and crackles on the right side as well as diminished breath sounds on the right side. Wheezing bilaterally as well as labored inspirations to breathe, increased effort to breathe. Tachypneic, in mild to moderate respiratory distress.  CARDIOVASCULAR: S1 and S2 appreciated. No murmurs, gallops or rubs. Rhythm was regular, distant. PMI not lateralized. The patient's chest is tender to palpation on the right side, posteriorly. 1+ pedal pulses.  EXTREMITIES: No lower extremity edema, calf tenderness or cyanosis was noted.  ABDOMEN: Soft, nontender. Bowel sounds are present. No hepatosplenomegaly or masses were noted.    RECTAL: Deferred.  MUSCULOSKELETAL: Muscle strength: Able to move all extremities. No cyanosis, degenerative joint disease. Mild kyphosis. Gait is not tested.  SKIN: Did not reveal any rashes, lesions, erythema, nodularity or induration. It was warm and dry to palpation.  LYMPHATIC: No adenopathy in the cervical region.  NEUROLOGIC: Cranial nerves grossly intact. Sensory is intact. No dysarthria or aphasia. PSYCH: The patient is alert and oriented to time, person and place, cooperative but agitated and stressed out at present.  DIAGNOSTIC DATA: The patient's EKG revealed sinus rhythm with occasional premature ventricular complexes at 80 beats per minute, normal axis, minimal voltage criteria for LVH, maybe normal variant. Septa  infarct with QS in V1 and V2. T depressions in precordial leads but not high lateral leads. Consider anterolateral ischemia. Comparing to EKG done on the 29th of March 2015, at that time she also had Q waves in V1 and V2 and ST depressions in lateral leads. No significant change since prior EKG it appears.   The patient's lab data, done today on the 20th of April 2015, BMP with glucose 117, otherwise unremarkable. The patient's liver enzymes and not taken. On the patient's CBC, white blood cell count was 9.8, hemoglobin was 12.3, and platelet count 169,000 as opposed to hemoglobin level of 14.7 on the 31st of March 2015. Absolute neutrophil count is elevated at 8. Coagulation panel done 20th of April 2015 revealed pro time of 20.1, INR 1.8, and activated PTT of 40.9.   Urinalysis: Yellow clear urine, negative for glucose, bilirubin or ketones, specific gravity 1.012, pH was 5.0, 2+ blood, negative for protein, nitrites or leukocyte esterase, 1 red blood cell, 1 white blood cell, no bacteria was seen, less than 1 epithelial cell, and mucus was present as well as 8 hyaline casts.   Chest x-ray done, PA and lateral, the 20th of April 2015, revealed new right middle lobe opacity concerning for pneumonia or subsegmental atelectasis. Follow-up radiographs were recommended according to radiology.  Chest x-ray, PA and lateral, 29th of March 2015, showed no active cardiopulmonary disease.   ASSESSMENT AND PLAN: 1.  Systemic antiinflammatory response syndrome with hypotension concerning for sepsis. Admit the patient to the medical floor. Blood cultures as well as sputum cultures are ordered. Broad spectrum antibiotic therapy including doxycycline will be initiated. As the patient was recently in the hospital, she  was treated already for atypical infection with Levaquin. We are going to initiate vancomycin as well as Zosyn and doxycycline, as mentioned above due to her recent leukopenia as well as thrombocytopenia.  We will also get infectious disease involved. Will get [facility] Spotted Fever, Ehrlichia serology as well.  2.  Right-sided pneumonia. We will continue antibiotics, get sputum culture, get also Legionella urine antigen.  3.  Chronic obstructive pulmonary disease exacerbation. Start the patient on Solu-Medrol, Advair Diskus, tiotropium as well as DuoNebs.  4.  Hypotension. Continue IV fluids at high rate holding blood pressure medications.  5.  Presyncope/syncopal episode, likely hypotension related. We will get echocardiogram as well as orthostatics intermittently.  6.  Hyperglycemia. Get Hemoglobin A1c to rule out diabetes mellitus leading to dehydration.  7.  Coagulopathy of unclear etiology. Will start the patient on vitamin K.  8.  Tobacco abuse. Discussed cessation for approximately 5 minutes. Nicotine replacement therapy will be initiated. She is agreeable.   TIME SPENT: One hour.  ____________________________ Katharina Caper, MD rv:sb D: 11/13/2013 16:07:09 ET T: 11/13/2013 16:51:49 ET JOB#: [id number]  cc: Katharina Caper, MD, <Dictator> Laverna Peace, MD Aileene Lanum MD ELECTRONICALLY SIGNED 11/25/2013 18:55

## 2014-11-27 ENCOUNTER — Ambulatory Visit
Admission: RE | Admit: 2014-11-27 | Discharge: 2014-11-27 | Disposition: A | Discharge status: Home / Self Care | Payer: BLUE CROSS/BLUE SHIELD | Source: Ambulatory Visit | Attending: Internal Medicine | Admitting: Internal Medicine

## 2014-11-27 ENCOUNTER — Other Ambulatory Visit: Payer: Self-pay | Admitting: Internal Medicine

## 2014-11-27 DIAGNOSIS — R05 Cough: Secondary | ICD-10-CM | POA: Diagnosis not present

## 2014-11-27 DIAGNOSIS — R0602 Shortness of breath: Secondary | ICD-10-CM

## 2014-11-27 DIAGNOSIS — J449 Chronic obstructive pulmonary disease, unspecified: Secondary | ICD-10-CM | POA: Diagnosis not present

## 2014-11-28 DIAGNOSIS — R0602 Shortness of breath: Secondary | ICD-10-CM | POA: Diagnosis not present

## 2014-12-12 DIAGNOSIS — B370 Candidal stomatitis: Secondary | ICD-10-CM | POA: Diagnosis not present

## 2014-12-12 DIAGNOSIS — R0602 Shortness of breath: Secondary | ICD-10-CM | POA: Diagnosis not present

## 2014-12-12 DIAGNOSIS — F17210 Nicotine dependence, cigarettes, uncomplicated: Secondary | ICD-10-CM | POA: Diagnosis not present

## 2014-12-12 DIAGNOSIS — J449 Chronic obstructive pulmonary disease, unspecified: Secondary | ICD-10-CM | POA: Diagnosis not present

## 2014-12-12 DIAGNOSIS — F331 Major depressive disorder, recurrent, moderate: Secondary | ICD-10-CM | POA: Diagnosis not present

## 2014-12-20 DIAGNOSIS — B370 Candidal stomatitis: Secondary | ICD-10-CM | POA: Diagnosis not present

## 2014-12-20 DIAGNOSIS — J449 Chronic obstructive pulmonary disease, unspecified: Secondary | ICD-10-CM | POA: Diagnosis not present

## 2014-12-20 DIAGNOSIS — M130 Polyarthritis, unspecified: Secondary | ICD-10-CM | POA: Diagnosis not present

## 2014-12-20 DIAGNOSIS — R05 Cough: Secondary | ICD-10-CM | POA: Diagnosis not present

## 2014-12-20 DIAGNOSIS — R0602 Shortness of breath: Secondary | ICD-10-CM | POA: Diagnosis not present

## 2014-12-20 DIAGNOSIS — F17210 Nicotine dependence, cigarettes, uncomplicated: Secondary | ICD-10-CM | POA: Diagnosis not present

## 2014-12-21 ENCOUNTER — Other Ambulatory Visit: Payer: Self-pay | Admitting: Physician Assistant

## 2014-12-21 DIAGNOSIS — R059 Cough, unspecified: Secondary | ICD-10-CM

## 2014-12-21 DIAGNOSIS — R05 Cough: Secondary | ICD-10-CM

## 2014-12-27 ENCOUNTER — Ambulatory Visit: Admission: RE | Admit: 2014-12-27 | Payer: Medicare Other | Source: Ambulatory Visit

## 2015-01-02 ENCOUNTER — Ambulatory Visit
Admission: RE | Admit: 2015-01-02 | Discharge: 2015-01-02 | Disposition: A | Discharge status: Home / Self Care | Payer: BLUE CROSS/BLUE SHIELD | Source: Ambulatory Visit | Attending: Physician Assistant | Admitting: Physician Assistant

## 2015-01-02 DIAGNOSIS — J432 Centrilobular emphysema: Secondary | ICD-10-CM | POA: Diagnosis not present

## 2015-01-02 DIAGNOSIS — R05 Cough: Secondary | ICD-10-CM

## 2015-01-02 DIAGNOSIS — R059 Cough, unspecified: Secondary | ICD-10-CM

## 2015-01-02 DIAGNOSIS — I2510 Atherosclerotic heart disease of native coronary artery without angina pectoris: Secondary | ICD-10-CM | POA: Diagnosis not present

## 2015-01-02 DIAGNOSIS — R918 Other nonspecific abnormal finding of lung field: Secondary | ICD-10-CM | POA: Diagnosis not present

## 2015-01-02 DIAGNOSIS — R0602 Shortness of breath: Secondary | ICD-10-CM | POA: Diagnosis not present

## 2015-01-03 DIAGNOSIS — R911 Solitary pulmonary nodule: Secondary | ICD-10-CM | POA: Diagnosis not present

## 2015-01-03 DIAGNOSIS — J449 Chronic obstructive pulmonary disease, unspecified: Secondary | ICD-10-CM | POA: Diagnosis not present

## 2015-01-03 DIAGNOSIS — F17210 Nicotine dependence, cigarettes, uncomplicated: Secondary | ICD-10-CM | POA: Diagnosis not present

## 2015-01-03 DIAGNOSIS — R0602 Shortness of breath: Secondary | ICD-10-CM | POA: Diagnosis not present

## 2015-01-04 ENCOUNTER — Other Ambulatory Visit: Payer: Self-pay | Admitting: Physician Assistant

## 2015-01-04 DIAGNOSIS — R911 Solitary pulmonary nodule: Secondary | ICD-10-CM

## 2015-01-07 ENCOUNTER — Ambulatory Visit
Admission: RE | Admit: 2015-01-07 | Discharge: 2015-01-07 | Disposition: A | Discharge status: Home / Self Care | Payer: Medicare Other | Source: Ambulatory Visit | Attending: Physician Assistant | Admitting: Physician Assistant

## 2015-01-07 DIAGNOSIS — R911 Solitary pulmonary nodule: Secondary | ICD-10-CM

## 2015-01-08 ENCOUNTER — Ambulatory Visit
Admission: RE | Admit: 2015-01-08 | Discharge: 2015-01-08 | Disposition: A | Discharge status: Home / Self Care | Payer: BLUE CROSS/BLUE SHIELD | Source: Ambulatory Visit | Attending: Physician Assistant | Admitting: Physician Assistant

## 2015-01-08 DIAGNOSIS — R918 Other nonspecific abnormal finding of lung field: Secondary | ICD-10-CM | POA: Diagnosis not present

## 2015-01-08 DIAGNOSIS — R911 Solitary pulmonary nodule: Secondary | ICD-10-CM | POA: Diagnosis present

## 2015-01-08 HISTORY — DX: Solitary pulmonary nodule: R91.1

## 2015-01-08 LAB — GLUCOSE, CAPILLARY: GLUCOSE-CAPILLARY: 107 mg/dL — AB (ref 65–99)

## 2015-01-08 MED ORDER — FLUDEOXYGLUCOSE F - 18 (FDG) INJECTION
13.1600 | Freq: Once | INTRAVENOUS | Status: AC | PRN
  Administered 2015-01-08: 13.16 via INTRAVENOUS

## 2015-01-18 DIAGNOSIS — J0140 Acute pansinusitis, unspecified: Secondary | ICD-10-CM | POA: Diagnosis not present

## 2015-01-18 DIAGNOSIS — J439 Emphysema, unspecified: Secondary | ICD-10-CM | POA: Diagnosis not present

## 2015-01-18 DIAGNOSIS — R911 Solitary pulmonary nodule: Secondary | ICD-10-CM | POA: Diagnosis not present

## 2015-01-18 DIAGNOSIS — F17210 Nicotine dependence, cigarettes, uncomplicated: Secondary | ICD-10-CM | POA: Diagnosis not present

## 2015-01-22 DIAGNOSIS — J0140 Acute pansinusitis, unspecified: Secondary | ICD-10-CM | POA: Diagnosis not present

## 2015-01-22 DIAGNOSIS — R911 Solitary pulmonary nodule: Secondary | ICD-10-CM | POA: Diagnosis not present

## 2015-01-22 DIAGNOSIS — F17210 Nicotine dependence, cigarettes, uncomplicated: Secondary | ICD-10-CM | POA: Diagnosis not present

## 2015-01-22 DIAGNOSIS — J449 Chronic obstructive pulmonary disease, unspecified: Secondary | ICD-10-CM | POA: Diagnosis not present

## 2015-01-22 DIAGNOSIS — R05 Cough: Secondary | ICD-10-CM | POA: Diagnosis not present

## 2015-01-22 DIAGNOSIS — R0602 Shortness of breath: Secondary | ICD-10-CM | POA: Diagnosis not present

## 2015-04-30 DIAGNOSIS — Z23 Encounter for immunization: Secondary | ICD-10-CM | POA: Diagnosis not present

## 2015-05-24 DIAGNOSIS — H9201 Otalgia, right ear: Secondary | ICD-10-CM | POA: Diagnosis not present

## 2015-05-24 DIAGNOSIS — R102 Pelvic and perineal pain: Secondary | ICD-10-CM | POA: Diagnosis not present

## 2015-05-24 DIAGNOSIS — Z7689 Persons encountering health services in other specified circumstances: Secondary | ICD-10-CM | POA: Diagnosis not present

## 2015-05-24 DIAGNOSIS — N3001 Acute cystitis with hematuria: Secondary | ICD-10-CM | POA: Diagnosis not present

## 2015-06-07 DIAGNOSIS — Z7689 Persons encountering health services in other specified circumstances: Secondary | ICD-10-CM | POA: Diagnosis not present

## 2015-06-07 DIAGNOSIS — J449 Chronic obstructive pulmonary disease, unspecified: Secondary | ICD-10-CM | POA: Diagnosis not present

## 2015-06-07 DIAGNOSIS — I509 Heart failure, unspecified: Secondary | ICD-10-CM | POA: Diagnosis not present

## 2015-06-07 DIAGNOSIS — F17210 Nicotine dependence, cigarettes, uncomplicated: Secondary | ICD-10-CM | POA: Diagnosis not present

## 2015-06-07 DIAGNOSIS — I10 Essential (primary) hypertension: Secondary | ICD-10-CM | POA: Diagnosis not present

## 2015-06-07 DIAGNOSIS — R319 Hematuria, unspecified: Secondary | ICD-10-CM | POA: Diagnosis not present

## 2015-06-07 DIAGNOSIS — H9202 Otalgia, left ear: Secondary | ICD-10-CM | POA: Diagnosis not present

## 2015-06-19 DIAGNOSIS — R3129 Other microscopic hematuria: Secondary | ICD-10-CM | POA: Diagnosis not present

## 2015-06-19 DIAGNOSIS — R1030 Lower abdominal pain, unspecified: Secondary | ICD-10-CM | POA: Diagnosis not present

## 2015-06-19 DIAGNOSIS — R109 Unspecified abdominal pain: Secondary | ICD-10-CM | POA: Diagnosis not present

## 2015-07-04 DIAGNOSIS — R3129 Other microscopic hematuria: Secondary | ICD-10-CM | POA: Diagnosis not present

## 2015-07-04 DIAGNOSIS — N368 Other specified disorders of urethra: Secondary | ICD-10-CM | POA: Diagnosis not present

## 2015-07-04 DIAGNOSIS — N952 Postmenopausal atrophic vaginitis: Secondary | ICD-10-CM | POA: Diagnosis not present

## 2015-07-30 DIAGNOSIS — R3121 Asymptomatic microscopic hematuria: Secondary | ICD-10-CM | POA: Diagnosis not present

## 2015-07-31 DIAGNOSIS — R3129 Other microscopic hematuria: Secondary | ICD-10-CM | POA: Diagnosis not present

## 2015-08-05 DIAGNOSIS — J209 Acute bronchitis, unspecified: Secondary | ICD-10-CM | POA: Diagnosis not present

## 2015-08-05 DIAGNOSIS — F17210 Nicotine dependence, cigarettes, uncomplicated: Secondary | ICD-10-CM | POA: Diagnosis not present

## 2015-08-05 DIAGNOSIS — B369 Superficial mycosis, unspecified: Secondary | ICD-10-CM | POA: Diagnosis not present

## 2015-08-12 DIAGNOSIS — M5440 Lumbago with sciatica, unspecified side: Secondary | ICD-10-CM | POA: Diagnosis not present

## 2015-08-12 DIAGNOSIS — I509 Heart failure, unspecified: Secondary | ICD-10-CM | POA: Diagnosis not present

## 2015-08-12 DIAGNOSIS — F17210 Nicotine dependence, cigarettes, uncomplicated: Secondary | ICD-10-CM | POA: Diagnosis not present

## 2015-09-27 DIAGNOSIS — F329 Major depressive disorder, single episode, unspecified: Secondary | ICD-10-CM | POA: Diagnosis not present

## 2015-09-27 DIAGNOSIS — J441 Chronic obstructive pulmonary disease with (acute) exacerbation: Secondary | ICD-10-CM | POA: Diagnosis not present

## 2015-09-27 DIAGNOSIS — F17210 Nicotine dependence, cigarettes, uncomplicated: Secondary | ICD-10-CM | POA: Diagnosis not present

## 2015-12-15 DIAGNOSIS — J449 Chronic obstructive pulmonary disease, unspecified: Secondary | ICD-10-CM | POA: Diagnosis not present

## 2015-12-15 DIAGNOSIS — F17210 Nicotine dependence, cigarettes, uncomplicated: Secondary | ICD-10-CM | POA: Diagnosis not present

## 2015-12-15 DIAGNOSIS — M545 Low back pain: Secondary | ICD-10-CM | POA: Diagnosis not present

## 2016-01-23 DIAGNOSIS — Z7982 Long term (current) use of aspirin: Secondary | ICD-10-CM | POA: Diagnosis not present

## 2016-01-23 DIAGNOSIS — M545 Low back pain: Secondary | ICD-10-CM | POA: Diagnosis not present

## 2016-01-23 DIAGNOSIS — F17210 Nicotine dependence, cigarettes, uncomplicated: Secondary | ICD-10-CM | POA: Diagnosis not present

## 2016-01-23 DIAGNOSIS — E861 Hypovolemia: Secondary | ICD-10-CM | POA: Diagnosis not present

## 2016-01-23 DIAGNOSIS — I5043 Acute on chronic combined systolic (congestive) and diastolic (congestive) heart failure: Secondary | ICD-10-CM | POA: Diagnosis not present

## 2016-02-06 DIAGNOSIS — I509 Heart failure, unspecified: Secondary | ICD-10-CM | POA: Diagnosis not present

## 2016-02-06 DIAGNOSIS — Z882 Allergy status to sulfonamides status: Secondary | ICD-10-CM | POA: Diagnosis not present

## 2016-02-06 DIAGNOSIS — K219 Gastro-esophageal reflux disease without esophagitis: Secondary | ICD-10-CM | POA: Diagnosis present

## 2016-02-06 DIAGNOSIS — M4316 Spondylolisthesis, lumbar region: Secondary | ICD-10-CM | POA: Diagnosis not present

## 2016-02-06 DIAGNOSIS — R079 Chest pain, unspecified: Secondary | ICD-10-CM | POA: Diagnosis not present

## 2016-02-06 DIAGNOSIS — I5021 Acute systolic (congestive) heart failure: Secondary | ICD-10-CM | POA: Diagnosis not present

## 2016-02-06 DIAGNOSIS — Z90710 Acquired absence of both cervix and uterus: Secondary | ICD-10-CM | POA: Diagnosis not present

## 2016-02-06 DIAGNOSIS — I472 Ventricular tachycardia: Secondary | ICD-10-CM | POA: Diagnosis present

## 2016-02-06 DIAGNOSIS — F418 Other specified anxiety disorders: Secondary | ICD-10-CM | POA: Diagnosis present

## 2016-02-06 DIAGNOSIS — J440 Chronic obstructive pulmonary disease with acute lower respiratory infection: Secondary | ICD-10-CM | POA: Diagnosis present

## 2016-02-06 DIAGNOSIS — E876 Hypokalemia: Secondary | ICD-10-CM | POA: Diagnosis present

## 2016-02-06 DIAGNOSIS — I10 Essential (primary) hypertension: Secondary | ICD-10-CM | POA: Diagnosis present

## 2016-02-06 DIAGNOSIS — J441 Chronic obstructive pulmonary disease with (acute) exacerbation: Secondary | ICD-10-CM | POA: Diagnosis not present

## 2016-02-06 DIAGNOSIS — M5137 Other intervertebral disc degeneration, lumbosacral region: Secondary | ICD-10-CM | POA: Diagnosis not present

## 2016-02-06 DIAGNOSIS — Z7982 Long term (current) use of aspirin: Secondary | ICD-10-CM | POA: Diagnosis not present

## 2016-02-06 DIAGNOSIS — J189 Pneumonia, unspecified organism: Secondary | ICD-10-CM | POA: Diagnosis present

## 2016-02-06 DIAGNOSIS — I480 Paroxysmal atrial fibrillation: Secondary | ICD-10-CM | POA: Diagnosis present

## 2016-02-06 DIAGNOSIS — R0602 Shortness of breath: Secondary | ICD-10-CM | POA: Diagnosis not present

## 2016-02-06 DIAGNOSIS — F17210 Nicotine dependence, cigarettes, uncomplicated: Secondary | ICD-10-CM | POA: Diagnosis present

## 2016-02-26 DIAGNOSIS — N9089 Other specified noninflammatory disorders of vulva and perineum: Secondary | ICD-10-CM | POA: Diagnosis not present

## 2016-02-26 DIAGNOSIS — B370 Candidal stomatitis: Secondary | ICD-10-CM | POA: Diagnosis not present

## 2016-03-21 DIAGNOSIS — R0602 Shortness of breath: Secondary | ICD-10-CM | POA: Diagnosis not present

## 2016-03-21 DIAGNOSIS — F17210 Nicotine dependence, cigarettes, uncomplicated: Secondary | ICD-10-CM | POA: Diagnosis present

## 2016-03-21 DIAGNOSIS — E875 Hyperkalemia: Secondary | ICD-10-CM | POA: Diagnosis not present

## 2016-03-21 DIAGNOSIS — I5023 Acute on chronic systolic (congestive) heart failure: Secondary | ICD-10-CM | POA: Diagnosis present

## 2016-03-21 DIAGNOSIS — I272 Other secondary pulmonary hypertension: Secondary | ICD-10-CM | POA: Diagnosis present

## 2016-03-21 DIAGNOSIS — Z7982 Long term (current) use of aspirin: Secondary | ICD-10-CM | POA: Diagnosis not present

## 2016-03-21 DIAGNOSIS — Z7951 Long term (current) use of inhaled steroids: Secondary | ICD-10-CM | POA: Diagnosis not present

## 2016-03-21 DIAGNOSIS — I519 Heart disease, unspecified: Secondary | ICD-10-CM | POA: Diagnosis not present

## 2016-03-21 DIAGNOSIS — I429 Cardiomyopathy, unspecified: Secondary | ICD-10-CM | POA: Diagnosis not present

## 2016-03-21 DIAGNOSIS — I2510 Atherosclerotic heart disease of native coronary artery without angina pectoris: Secondary | ICD-10-CM | POA: Diagnosis present

## 2016-03-21 DIAGNOSIS — Z882 Allergy status to sulfonamides status: Secondary | ICD-10-CM | POA: Diagnosis not present

## 2016-03-21 DIAGNOSIS — Z6829 Body mass index (BMI) 29.0-29.9, adult: Secondary | ICD-10-CM | POA: Diagnosis not present

## 2016-03-21 DIAGNOSIS — R918 Other nonspecific abnormal finding of lung field: Secondary | ICD-10-CM | POA: Diagnosis not present

## 2016-03-21 DIAGNOSIS — I110 Hypertensive heart disease with heart failure: Secondary | ICD-10-CM | POA: Diagnosis present

## 2016-03-21 DIAGNOSIS — Z7952 Long term (current) use of systemic steroids: Secondary | ICD-10-CM | POA: Diagnosis not present

## 2016-03-21 DIAGNOSIS — I509 Heart failure, unspecified: Secondary | ICD-10-CM | POA: Diagnosis not present

## 2016-03-21 DIAGNOSIS — Z716 Tobacco abuse counseling: Secondary | ICD-10-CM | POA: Diagnosis not present

## 2016-03-21 DIAGNOSIS — I517 Cardiomegaly: Secondary | ICD-10-CM | POA: Diagnosis not present

## 2016-03-21 DIAGNOSIS — I058 Other rheumatic mitral valve diseases: Secondary | ICD-10-CM | POA: Diagnosis not present

## 2016-03-21 DIAGNOSIS — R0789 Other chest pain: Secondary | ICD-10-CM | POA: Diagnosis not present

## 2016-03-21 DIAGNOSIS — R0989 Other specified symptoms and signs involving the circulatory and respiratory systems: Secondary | ICD-10-CM | POA: Diagnosis not present

## 2016-03-21 DIAGNOSIS — I700 Atherosclerosis of aorta: Secondary | ICD-10-CM | POA: Diagnosis not present

## 2016-03-21 DIAGNOSIS — I493 Ventricular premature depolarization: Secondary | ICD-10-CM | POA: Diagnosis present

## 2016-03-21 DIAGNOSIS — F419 Anxiety disorder, unspecified: Secondary | ICD-10-CM | POA: Diagnosis present

## 2016-03-21 DIAGNOSIS — R7989 Other specified abnormal findings of blood chemistry: Secondary | ICD-10-CM | POA: Diagnosis not present

## 2016-03-21 DIAGNOSIS — K219 Gastro-esophageal reflux disease without esophagitis: Secondary | ICD-10-CM | POA: Diagnosis present

## 2016-03-21 DIAGNOSIS — J449 Chronic obstructive pulmonary disease, unspecified: Secondary | ICD-10-CM | POA: Diagnosis present

## 2016-03-21 DIAGNOSIS — R740 Nonspecific elevation of levels of transaminase and lactic acid dehydrogenase [LDH]: Secondary | ICD-10-CM | POA: Diagnosis not present

## 2016-03-21 DIAGNOSIS — Z720 Tobacco use: Secondary | ICD-10-CM | POA: Diagnosis not present

## 2016-03-21 DIAGNOSIS — B370 Candidal stomatitis: Secondary | ICD-10-CM | POA: Diagnosis present

## 2016-03-24 DIAGNOSIS — F17200 Nicotine dependence, unspecified, uncomplicated: Secondary | ICD-10-CM | POA: Insufficient documentation

## 2016-04-23 DIAGNOSIS — I160 Hypertensive urgency: Secondary | ICD-10-CM | POA: Diagnosis present

## 2016-04-23 DIAGNOSIS — I959 Hypotension, unspecified: Secondary | ICD-10-CM | POA: Diagnosis present

## 2016-04-23 DIAGNOSIS — Z7982 Long term (current) use of aspirin: Secondary | ICD-10-CM | POA: Diagnosis not present

## 2016-04-23 DIAGNOSIS — I110 Hypertensive heart disease with heart failure: Secondary | ICD-10-CM | POA: Diagnosis present

## 2016-04-23 DIAGNOSIS — R0600 Dyspnea, unspecified: Secondary | ICD-10-CM | POA: Diagnosis not present

## 2016-04-23 DIAGNOSIS — Z7951 Long term (current) use of inhaled steroids: Secondary | ICD-10-CM | POA: Diagnosis not present

## 2016-04-23 DIAGNOSIS — Z87891 Personal history of nicotine dependence: Secondary | ICD-10-CM | POA: Diagnosis not present

## 2016-04-23 DIAGNOSIS — R0603 Acute respiratory distress: Secondary | ICD-10-CM | POA: Diagnosis not present

## 2016-04-23 DIAGNOSIS — E872 Acidosis: Secondary | ICD-10-CM | POA: Diagnosis present

## 2016-04-23 DIAGNOSIS — J9601 Acute respiratory failure with hypoxia: Secondary | ICD-10-CM | POA: Diagnosis present

## 2016-04-23 DIAGNOSIS — J9602 Acute respiratory failure with hypercapnia: Secondary | ICD-10-CM | POA: Diagnosis present

## 2016-04-23 DIAGNOSIS — E875 Hyperkalemia: Secondary | ICD-10-CM | POA: Diagnosis present

## 2016-04-23 DIAGNOSIS — I5023 Acute on chronic systolic (congestive) heart failure: Secondary | ICD-10-CM | POA: Diagnosis not present

## 2016-04-23 DIAGNOSIS — J441 Chronic obstructive pulmonary disease with (acute) exacerbation: Secondary | ICD-10-CM | POA: Diagnosis not present

## 2016-04-23 DIAGNOSIS — Z882 Allergy status to sulfonamides status: Secondary | ICD-10-CM | POA: Diagnosis not present

## 2016-04-23 DIAGNOSIS — R4182 Altered mental status, unspecified: Secondary | ICD-10-CM | POA: Diagnosis present

## 2016-05-06 DIAGNOSIS — I2109 ST elevation (STEMI) myocardial infarction involving other coronary artery of anterior wall: Secondary | ICD-10-CM | POA: Diagnosis not present

## 2016-05-06 DIAGNOSIS — J9622 Acute and chronic respiratory failure with hypercapnia: Secondary | ICD-10-CM | POA: Diagnosis not present

## 2016-05-06 DIAGNOSIS — R9431 Abnormal electrocardiogram [ECG] [EKG]: Secondary | ICD-10-CM | POA: Diagnosis not present

## 2016-05-06 DIAGNOSIS — R000 Tachycardia, unspecified: Secondary | ICD-10-CM | POA: Diagnosis not present

## 2016-05-06 DIAGNOSIS — J441 Chronic obstructive pulmonary disease with (acute) exacerbation: Secondary | ICD-10-CM | POA: Diagnosis not present

## 2016-05-06 DIAGNOSIS — R0600 Dyspnea, unspecified: Secondary | ICD-10-CM | POA: Diagnosis not present

## 2016-05-06 DIAGNOSIS — J810 Acute pulmonary edema: Secondary | ICD-10-CM | POA: Diagnosis not present

## 2016-05-06 DIAGNOSIS — Z6829 Body mass index (BMI) 29.0-29.9, adult: Secondary | ICD-10-CM | POA: Diagnosis not present

## 2016-05-06 DIAGNOSIS — R0989 Other specified symptoms and signs involving the circulatory and respiratory systems: Secondary | ICD-10-CM | POA: Diagnosis not present

## 2016-05-06 DIAGNOSIS — R570 Cardiogenic shock: Secondary | ICD-10-CM | POA: Diagnosis not present

## 2016-05-06 DIAGNOSIS — I469 Cardiac arrest, cause unspecified: Secondary | ICD-10-CM | POA: Diagnosis not present

## 2016-05-06 DIAGNOSIS — I471 Supraventricular tachycardia: Secondary | ICD-10-CM | POA: Diagnosis not present

## 2016-05-06 DIAGNOSIS — J9621 Acute and chronic respiratory failure with hypoxia: Secondary | ICD-10-CM | POA: Diagnosis not present

## 2016-05-06 DIAGNOSIS — I5023 Acute on chronic systolic (congestive) heart failure: Secondary | ICD-10-CM | POA: Diagnosis not present

## 2016-05-06 DIAGNOSIS — R0602 Shortness of breath: Secondary | ICD-10-CM | POA: Diagnosis not present

## 2016-05-06 DIAGNOSIS — Z87891 Personal history of nicotine dependence: Secondary | ICD-10-CM | POA: Diagnosis not present

## 2016-05-06 DIAGNOSIS — Z6831 Body mass index (BMI) 31.0-31.9, adult: Secondary | ICD-10-CM | POA: Diagnosis not present

## 2016-05-06 DIAGNOSIS — J42 Unspecified chronic bronchitis: Secondary | ICD-10-CM | POA: Diagnosis not present

## 2016-05-06 DIAGNOSIS — J811 Chronic pulmonary edema: Secondary | ICD-10-CM | POA: Diagnosis not present

## 2016-05-06 DIAGNOSIS — F419 Anxiety disorder, unspecified: Secondary | ICD-10-CM | POA: Diagnosis not present

## 2016-05-06 DIAGNOSIS — I491 Atrial premature depolarization: Secondary | ICD-10-CM | POA: Diagnosis not present

## 2016-05-07 DIAGNOSIS — Z66 Do not resuscitate: Secondary | ICD-10-CM | POA: Diagnosis not present

## 2016-05-07 DIAGNOSIS — R410 Disorientation, unspecified: Secondary | ICD-10-CM | POA: Diagnosis present

## 2016-05-07 DIAGNOSIS — J449 Chronic obstructive pulmonary disease, unspecified: Secondary | ICD-10-CM | POA: Diagnosis not present

## 2016-05-07 DIAGNOSIS — J441 Chronic obstructive pulmonary disease with (acute) exacerbation: Secondary | ICD-10-CM | POA: Diagnosis present

## 2016-05-07 DIAGNOSIS — J90 Pleural effusion, not elsewhere classified: Secondary | ICD-10-CM | POA: Diagnosis not present

## 2016-05-07 DIAGNOSIS — I081 Rheumatic disorders of both mitral and tricuspid valves: Secondary | ICD-10-CM | POA: Diagnosis not present

## 2016-05-07 DIAGNOSIS — I5020 Unspecified systolic (congestive) heart failure: Secondary | ICD-10-CM | POA: Diagnosis not present

## 2016-05-07 DIAGNOSIS — R579 Shock, unspecified: Secondary | ICD-10-CM | POA: Diagnosis not present

## 2016-05-07 DIAGNOSIS — J432 Centrilobular emphysema: Secondary | ICD-10-CM | POA: Diagnosis not present

## 2016-05-07 DIAGNOSIS — Z882 Allergy status to sulfonamides status: Secondary | ICD-10-CM | POA: Diagnosis not present

## 2016-05-07 DIAGNOSIS — R570 Cardiogenic shock: Secondary | ICD-10-CM | POA: Diagnosis present

## 2016-05-07 DIAGNOSIS — R918 Other nonspecific abnormal finding of lung field: Secondary | ICD-10-CM | POA: Diagnosis not present

## 2016-05-07 DIAGNOSIS — Z5189 Encounter for other specified aftercare: Secondary | ICD-10-CM | POA: Diagnosis not present

## 2016-05-07 DIAGNOSIS — J9801 Acute bronchospasm: Secondary | ICD-10-CM | POA: Diagnosis not present

## 2016-05-07 DIAGNOSIS — J9601 Acute respiratory failure with hypoxia: Secondary | ICD-10-CM | POA: Diagnosis not present

## 2016-05-07 DIAGNOSIS — R471 Dysarthria and anarthria: Secondary | ICD-10-CM | POA: Diagnosis not present

## 2016-05-07 DIAGNOSIS — I10 Essential (primary) hypertension: Secondary | ICD-10-CM | POA: Diagnosis not present

## 2016-05-07 DIAGNOSIS — R000 Tachycardia, unspecified: Secondary | ICD-10-CM | POA: Diagnosis not present

## 2016-05-07 DIAGNOSIS — E785 Hyperlipidemia, unspecified: Secondary | ICD-10-CM | POA: Diagnosis not present

## 2016-05-07 DIAGNOSIS — R5381 Other malaise: Secondary | ICD-10-CM | POA: Diagnosis not present

## 2016-05-07 DIAGNOSIS — I5023 Acute on chronic systolic (congestive) heart failure: Secondary | ICD-10-CM | POA: Diagnosis present

## 2016-05-07 DIAGNOSIS — J9620 Acute and chronic respiratory failure, unspecified whether with hypoxia or hypercapnia: Secondary | ICD-10-CM | POA: Diagnosis not present

## 2016-05-07 DIAGNOSIS — F3289 Other specified depressive episodes: Secondary | ICD-10-CM | POA: Diagnosis not present

## 2016-05-07 DIAGNOSIS — M7989 Other specified soft tissue disorders: Secondary | ICD-10-CM | POA: Diagnosis not present

## 2016-05-07 DIAGNOSIS — I472 Ventricular tachycardia: Secondary | ICD-10-CM | POA: Diagnosis not present

## 2016-05-07 DIAGNOSIS — I160 Hypertensive urgency: Secondary | ICD-10-CM | POA: Diagnosis present

## 2016-05-07 DIAGNOSIS — R140 Abdominal distension (gaseous): Secondary | ICD-10-CM | POA: Diagnosis not present

## 2016-05-07 DIAGNOSIS — I5022 Chronic systolic (congestive) heart failure: Secondary | ICD-10-CM | POA: Diagnosis not present

## 2016-05-07 DIAGNOSIS — Z7982 Long term (current) use of aspirin: Secondary | ICD-10-CM | POA: Diagnosis not present

## 2016-05-07 DIAGNOSIS — J398 Other specified diseases of upper respiratory tract: Secondary | ICD-10-CM | POA: Diagnosis not present

## 2016-05-07 DIAGNOSIS — Z7951 Long term (current) use of inhaled steroids: Secondary | ICD-10-CM | POA: Diagnosis not present

## 2016-05-07 DIAGNOSIS — R279 Unspecified lack of coordination: Secondary | ICD-10-CM | POA: Diagnosis not present

## 2016-05-07 DIAGNOSIS — Z452 Encounter for adjustment and management of vascular access device: Secondary | ICD-10-CM | POA: Diagnosis not present

## 2016-05-07 DIAGNOSIS — I517 Cardiomegaly: Secondary | ICD-10-CM | POA: Diagnosis not present

## 2016-05-07 DIAGNOSIS — J9691 Respiratory failure, unspecified with hypoxia: Secondary | ICD-10-CM | POA: Diagnosis not present

## 2016-05-07 DIAGNOSIS — R578 Other shock: Secondary | ICD-10-CM | POA: Diagnosis not present

## 2016-05-07 DIAGNOSIS — I471 Supraventricular tachycardia: Secondary | ICD-10-CM | POA: Diagnosis present

## 2016-05-07 DIAGNOSIS — R0902 Hypoxemia: Secondary | ICD-10-CM | POA: Diagnosis not present

## 2016-05-07 DIAGNOSIS — I2510 Atherosclerotic heart disease of native coronary artery without angina pectoris: Secondary | ICD-10-CM | POA: Diagnosis present

## 2016-05-07 DIAGNOSIS — J810 Acute pulmonary edema: Secondary | ICD-10-CM | POA: Diagnosis not present

## 2016-05-07 DIAGNOSIS — F329 Major depressive disorder, single episode, unspecified: Secondary | ICD-10-CM | POA: Diagnosis present

## 2016-05-07 DIAGNOSIS — Z4682 Encounter for fitting and adjustment of non-vascular catheter: Secondary | ICD-10-CM | POA: Diagnosis not present

## 2016-05-07 DIAGNOSIS — J9621 Acute and chronic respiratory failure with hypoxia: Secondary | ICD-10-CM | POA: Diagnosis present

## 2016-05-07 DIAGNOSIS — Z87891 Personal history of nicotine dependence: Secondary | ICD-10-CM | POA: Diagnosis not present

## 2016-05-07 DIAGNOSIS — J9622 Acute and chronic respiratory failure with hypercapnia: Secondary | ICD-10-CM | POA: Diagnosis present

## 2016-05-07 DIAGNOSIS — J811 Chronic pulmonary edema: Secondary | ICD-10-CM | POA: Diagnosis not present

## 2016-05-07 DIAGNOSIS — F419 Anxiety disorder, unspecified: Secondary | ICD-10-CM | POA: Diagnosis present

## 2016-05-07 DIAGNOSIS — J42 Unspecified chronic bronchitis: Secondary | ICD-10-CM | POA: Diagnosis present

## 2016-05-07 DIAGNOSIS — I110 Hypertensive heart disease with heart failure: Secondary | ICD-10-CM | POA: Diagnosis present

## 2016-05-07 DIAGNOSIS — J9692 Respiratory failure, unspecified with hypercapnia: Secondary | ICD-10-CM | POA: Diagnosis not present

## 2016-05-07 DIAGNOSIS — E876 Hypokalemia: Secondary | ICD-10-CM | POA: Diagnosis not present

## 2016-05-07 DIAGNOSIS — M6281 Muscle weakness (generalized): Secondary | ICD-10-CM | POA: Diagnosis not present

## 2016-05-07 DIAGNOSIS — R0602 Shortness of breath: Secondary | ICD-10-CM | POA: Diagnosis not present

## 2016-05-07 DIAGNOSIS — Z515 Encounter for palliative care: Secondary | ICD-10-CM | POA: Diagnosis not present

## 2016-05-07 DIAGNOSIS — E872 Acidosis: Secondary | ICD-10-CM | POA: Diagnosis present

## 2016-05-07 DIAGNOSIS — J9602 Acute respiratory failure with hypercapnia: Secondary | ICD-10-CM | POA: Diagnosis not present

## 2016-05-07 DIAGNOSIS — I8310 Varicose veins of unspecified lower extremity with inflammation: Secondary | ICD-10-CM | POA: Diagnosis present

## 2016-05-07 DIAGNOSIS — I4891 Unspecified atrial fibrillation: Secondary | ICD-10-CM | POA: Diagnosis present

## 2016-05-19 DIAGNOSIS — Z79899 Other long term (current) drug therapy: Secondary | ICD-10-CM | POA: Diagnosis not present

## 2016-05-19 DIAGNOSIS — Z87891 Personal history of nicotine dependence: Secondary | ICD-10-CM | POA: Diagnosis not present

## 2016-05-19 DIAGNOSIS — Z7982 Long term (current) use of aspirin: Secondary | ICD-10-CM | POA: Diagnosis not present

## 2016-05-19 DIAGNOSIS — F3289 Other specified depressive episodes: Secondary | ICD-10-CM | POA: Diagnosis not present

## 2016-05-19 DIAGNOSIS — R0600 Dyspnea, unspecified: Secondary | ICD-10-CM | POA: Diagnosis not present

## 2016-05-19 DIAGNOSIS — E784 Other hyperlipidemia: Secondary | ICD-10-CM | POA: Diagnosis not present

## 2016-05-19 DIAGNOSIS — R0603 Acute respiratory distress: Secondary | ICD-10-CM | POA: Diagnosis not present

## 2016-05-19 DIAGNOSIS — F419 Anxiety disorder, unspecified: Secondary | ICD-10-CM | POA: Diagnosis present

## 2016-05-19 DIAGNOSIS — D72829 Elevated white blood cell count, unspecified: Secondary | ICD-10-CM | POA: Diagnosis present

## 2016-05-19 DIAGNOSIS — J9600 Acute respiratory failure, unspecified whether with hypoxia or hypercapnia: Secondary | ICD-10-CM | POA: Diagnosis not present

## 2016-05-19 DIAGNOSIS — I5023 Acute on chronic systolic (congestive) heart failure: Secondary | ICD-10-CM | POA: Diagnosis present

## 2016-05-19 DIAGNOSIS — K219 Gastro-esophageal reflux disease without esophagitis: Secondary | ICD-10-CM | POA: Diagnosis not present

## 2016-05-19 DIAGNOSIS — I5022 Chronic systolic (congestive) heart failure: Secondary | ICD-10-CM | POA: Diagnosis not present

## 2016-05-19 DIAGNOSIS — I110 Hypertensive heart disease with heart failure: Secondary | ICD-10-CM | POA: Diagnosis present

## 2016-05-19 DIAGNOSIS — R911 Solitary pulmonary nodule: Secondary | ICD-10-CM | POA: Diagnosis present

## 2016-05-19 DIAGNOSIS — R0602 Shortness of breath: Secondary | ICD-10-CM | POA: Diagnosis present

## 2016-05-19 DIAGNOSIS — R5381 Other malaise: Secondary | ICD-10-CM | POA: Diagnosis not present

## 2016-05-19 DIAGNOSIS — Z5189 Encounter for other specified aftercare: Secondary | ICD-10-CM | POA: Diagnosis not present

## 2016-05-19 DIAGNOSIS — M6281 Muscle weakness (generalized): Secondary | ICD-10-CM | POA: Diagnosis not present

## 2016-05-19 DIAGNOSIS — I509 Heart failure, unspecified: Secondary | ICD-10-CM | POA: Diagnosis not present

## 2016-05-19 DIAGNOSIS — I5031 Acute diastolic (congestive) heart failure: Secondary | ICD-10-CM | POA: Diagnosis not present

## 2016-05-19 DIAGNOSIS — J9622 Acute and chronic respiratory failure with hypercapnia: Secondary | ICD-10-CM | POA: Diagnosis not present

## 2016-05-19 DIAGNOSIS — J441 Chronic obstructive pulmonary disease with (acute) exacerbation: Secondary | ICD-10-CM | POA: Diagnosis not present

## 2016-05-19 DIAGNOSIS — J449 Chronic obstructive pulmonary disease, unspecified: Secondary | ICD-10-CM | POA: Diagnosis not present

## 2016-05-19 DIAGNOSIS — Z7952 Long term (current) use of systemic steroids: Secondary | ICD-10-CM | POA: Diagnosis not present

## 2016-05-19 DIAGNOSIS — J9621 Acute and chronic respiratory failure with hypoxia: Secondary | ICD-10-CM | POA: Diagnosis not present

## 2016-05-19 DIAGNOSIS — F329 Major depressive disorder, single episode, unspecified: Secondary | ICD-10-CM | POA: Diagnosis not present

## 2016-05-19 DIAGNOSIS — I10 Essential (primary) hypertension: Secondary | ICD-10-CM | POA: Diagnosis not present

## 2016-05-19 DIAGNOSIS — R062 Wheezing: Secondary | ICD-10-CM | POA: Diagnosis not present

## 2016-05-19 DIAGNOSIS — E785 Hyperlipidemia, unspecified: Secondary | ICD-10-CM | POA: Diagnosis not present

## 2016-05-19 DIAGNOSIS — E872 Acidosis: Secondary | ICD-10-CM | POA: Diagnosis present

## 2016-05-19 DIAGNOSIS — I4891 Unspecified atrial fibrillation: Secondary | ICD-10-CM | POA: Diagnosis not present

## 2016-05-19 DIAGNOSIS — R279 Unspecified lack of coordination: Secondary | ICD-10-CM | POA: Diagnosis not present

## 2016-05-19 DIAGNOSIS — J9601 Acute respiratory failure with hypoxia: Secondary | ICD-10-CM | POA: Diagnosis present

## 2016-05-21 DIAGNOSIS — I509 Heart failure, unspecified: Secondary | ICD-10-CM | POA: Diagnosis not present

## 2016-05-21 DIAGNOSIS — F329 Major depressive disorder, single episode, unspecified: Secondary | ICD-10-CM | POA: Diagnosis not present

## 2016-05-21 DIAGNOSIS — E784 Other hyperlipidemia: Secondary | ICD-10-CM | POA: Diagnosis not present

## 2016-05-21 DIAGNOSIS — K219 Gastro-esophageal reflux disease without esophagitis: Secondary | ICD-10-CM | POA: Diagnosis not present

## 2016-05-21 DIAGNOSIS — I10 Essential (primary) hypertension: Secondary | ICD-10-CM | POA: Diagnosis not present

## 2016-05-21 DIAGNOSIS — J9600 Acute respiratory failure, unspecified whether with hypoxia or hypercapnia: Secondary | ICD-10-CM | POA: Diagnosis not present

## 2016-05-21 DIAGNOSIS — J449 Chronic obstructive pulmonary disease, unspecified: Secondary | ICD-10-CM | POA: Diagnosis not present

## 2016-05-21 DIAGNOSIS — F419 Anxiety disorder, unspecified: Secondary | ICD-10-CM | POA: Diagnosis not present

## 2016-05-27 DIAGNOSIS — E872 Acidosis: Secondary | ICD-10-CM | POA: Diagnosis present

## 2016-05-27 DIAGNOSIS — D72829 Elevated white blood cell count, unspecified: Secondary | ICD-10-CM | POA: Diagnosis present

## 2016-05-27 DIAGNOSIS — Z7952 Long term (current) use of systemic steroids: Secondary | ICD-10-CM | POA: Diagnosis not present

## 2016-05-27 DIAGNOSIS — F3289 Other specified depressive episodes: Secondary | ICD-10-CM | POA: Diagnosis not present

## 2016-05-27 DIAGNOSIS — E785 Hyperlipidemia, unspecified: Secondary | ICD-10-CM | POA: Diagnosis not present

## 2016-05-27 DIAGNOSIS — Z5189 Encounter for other specified aftercare: Secondary | ICD-10-CM | POA: Diagnosis not present

## 2016-05-27 DIAGNOSIS — I5023 Acute on chronic systolic (congestive) heart failure: Secondary | ICD-10-CM | POA: Diagnosis present

## 2016-05-27 DIAGNOSIS — F419 Anxiety disorder, unspecified: Secondary | ICD-10-CM | POA: Diagnosis present

## 2016-05-27 DIAGNOSIS — I5031 Acute diastolic (congestive) heart failure: Secondary | ICD-10-CM | POA: Diagnosis not present

## 2016-05-27 DIAGNOSIS — J441 Chronic obstructive pulmonary disease with (acute) exacerbation: Secondary | ICD-10-CM | POA: Diagnosis not present

## 2016-05-27 DIAGNOSIS — R0600 Dyspnea, unspecified: Secondary | ICD-10-CM | POA: Diagnosis not present

## 2016-05-27 DIAGNOSIS — E784 Other hyperlipidemia: Secondary | ICD-10-CM | POA: Diagnosis not present

## 2016-05-27 DIAGNOSIS — Z79899 Other long term (current) drug therapy: Secondary | ICD-10-CM | POA: Diagnosis not present

## 2016-05-27 DIAGNOSIS — I110 Hypertensive heart disease with heart failure: Secondary | ICD-10-CM | POA: Diagnosis present

## 2016-05-27 DIAGNOSIS — R911 Solitary pulmonary nodule: Secondary | ICD-10-CM | POA: Diagnosis present

## 2016-05-27 DIAGNOSIS — I4891 Unspecified atrial fibrillation: Secondary | ICD-10-CM | POA: Diagnosis not present

## 2016-05-27 DIAGNOSIS — I5022 Chronic systolic (congestive) heart failure: Secondary | ICD-10-CM | POA: Diagnosis not present

## 2016-05-27 DIAGNOSIS — J9601 Acute respiratory failure with hypoxia: Secondary | ICD-10-CM | POA: Diagnosis present

## 2016-05-27 DIAGNOSIS — R0602 Shortness of breath: Secondary | ICD-10-CM | POA: Diagnosis present

## 2016-05-27 DIAGNOSIS — Z87891 Personal history of nicotine dependence: Secondary | ICD-10-CM | POA: Diagnosis not present

## 2016-05-27 DIAGNOSIS — M6281 Muscle weakness (generalized): Secondary | ICD-10-CM | POA: Diagnosis not present

## 2016-05-27 DIAGNOSIS — I509 Heart failure, unspecified: Secondary | ICD-10-CM | POA: Diagnosis not present

## 2016-05-27 DIAGNOSIS — I10 Essential (primary) hypertension: Secondary | ICD-10-CM | POA: Diagnosis not present

## 2016-05-27 DIAGNOSIS — K219 Gastro-esophageal reflux disease without esophagitis: Secondary | ICD-10-CM | POA: Diagnosis not present

## 2016-05-27 DIAGNOSIS — J449 Chronic obstructive pulmonary disease, unspecified: Secondary | ICD-10-CM | POA: Diagnosis present

## 2016-05-27 DIAGNOSIS — R062 Wheezing: Secondary | ICD-10-CM | POA: Diagnosis not present

## 2016-05-27 DIAGNOSIS — R0603 Acute respiratory distress: Secondary | ICD-10-CM | POA: Diagnosis not present

## 2016-05-27 DIAGNOSIS — Z7982 Long term (current) use of aspirin: Secondary | ICD-10-CM | POA: Diagnosis not present

## 2016-06-03 ENCOUNTER — Emergency Department: Payer: Medicare Other

## 2016-06-03 ENCOUNTER — Inpatient Hospital Stay (HOSPITAL_COMMUNITY)
Admit: 2016-06-03 | Discharge: 2016-06-03 | Disposition: A | Discharge status: Home / Self Care | Payer: Medicare Other | Attending: Internal Medicine | Admitting: Internal Medicine

## 2016-06-03 ENCOUNTER — Inpatient Hospital Stay
Admission: EM | Admit: 2016-06-03 | Discharge: 2016-06-05 | DRG: 291 | Disposition: A | Discharge status: Skilled Nursing Facility | Payer: Medicare Other | Attending: Internal Medicine | Admitting: Internal Medicine

## 2016-06-03 ENCOUNTER — Encounter: Payer: Self-pay | Admitting: Emergency Medicine

## 2016-06-03 DIAGNOSIS — D72829 Elevated white blood cell count, unspecified: Secondary | ICD-10-CM | POA: Diagnosis present

## 2016-06-03 DIAGNOSIS — Z87891 Personal history of nicotine dependence: Secondary | ICD-10-CM | POA: Diagnosis not present

## 2016-06-03 DIAGNOSIS — J449 Chronic obstructive pulmonary disease, unspecified: Secondary | ICD-10-CM | POA: Diagnosis present

## 2016-06-03 DIAGNOSIS — I5023 Acute on chronic systolic (congestive) heart failure: Secondary | ICD-10-CM | POA: Diagnosis present

## 2016-06-03 DIAGNOSIS — Z79899 Other long term (current) drug therapy: Secondary | ICD-10-CM | POA: Diagnosis not present

## 2016-06-03 DIAGNOSIS — R2689 Other abnormalities of gait and mobility: Secondary | ICD-10-CM | POA: Diagnosis not present

## 2016-06-03 DIAGNOSIS — E872 Acidosis: Secondary | ICD-10-CM | POA: Diagnosis present

## 2016-06-03 DIAGNOSIS — Z7952 Long term (current) use of systemic steroids: Secondary | ICD-10-CM

## 2016-06-03 DIAGNOSIS — I5031 Acute diastolic (congestive) heart failure: Secondary | ICD-10-CM | POA: Diagnosis not present

## 2016-06-03 DIAGNOSIS — F339 Major depressive disorder, recurrent, unspecified: Secondary | ICD-10-CM | POA: Diagnosis not present

## 2016-06-03 DIAGNOSIS — I110 Hypertensive heart disease with heart failure: Principal | ICD-10-CM | POA: Diagnosis present

## 2016-06-03 DIAGNOSIS — R0602 Shortness of breath: Secondary | ICD-10-CM | POA: Diagnosis not present

## 2016-06-03 DIAGNOSIS — J9601 Acute respiratory failure with hypoxia: Secondary | ICD-10-CM | POA: Diagnosis present

## 2016-06-03 DIAGNOSIS — J441 Chronic obstructive pulmonary disease with (acute) exacerbation: Secondary | ICD-10-CM

## 2016-06-03 DIAGNOSIS — F419 Anxiety disorder, unspecified: Secondary | ICD-10-CM | POA: Diagnosis present

## 2016-06-03 DIAGNOSIS — R0603 Acute respiratory distress: Secondary | ICD-10-CM | POA: Diagnosis not present

## 2016-06-03 DIAGNOSIS — I509 Heart failure, unspecified: Secondary | ICD-10-CM

## 2016-06-03 DIAGNOSIS — R062 Wheezing: Secondary | ICD-10-CM | POA: Diagnosis not present

## 2016-06-03 DIAGNOSIS — Z7982 Long term (current) use of aspirin: Secondary | ICD-10-CM

## 2016-06-03 DIAGNOSIS — I10 Essential (primary) hypertension: Secondary | ICD-10-CM | POA: Diagnosis not present

## 2016-06-03 DIAGNOSIS — J9600 Acute respiratory failure, unspecified whether with hypoxia or hypercapnia: Secondary | ICD-10-CM | POA: Diagnosis not present

## 2016-06-03 DIAGNOSIS — R0600 Dyspnea, unspecified: Secondary | ICD-10-CM

## 2016-06-03 DIAGNOSIS — R262 Difficulty in walking, not elsewhere classified: Secondary | ICD-10-CM | POA: Diagnosis not present

## 2016-06-03 DIAGNOSIS — R911 Solitary pulmonary nodule: Secondary | ICD-10-CM | POA: Diagnosis present

## 2016-06-03 DIAGNOSIS — I4891 Unspecified atrial fibrillation: Secondary | ICD-10-CM | POA: Diagnosis not present

## 2016-06-03 HISTORY — DX: Essential (primary) hypertension: I10

## 2016-06-03 HISTORY — DX: Chronic obstructive pulmonary disease, unspecified: J44.9

## 2016-06-03 LAB — GLUCOSE, CAPILLARY
GLUCOSE-CAPILLARY: 158 mg/dL — AB (ref 65–99)
Glucose-Capillary: 128 mg/dL — ABNORMAL HIGH (ref 65–99)
Glucose-Capillary: 141 mg/dL — ABNORMAL HIGH (ref 65–99)
Glucose-Capillary: 178 mg/dL — ABNORMAL HIGH (ref 65–99)

## 2016-06-03 LAB — URINALYSIS COMPLETE WITH MICROSCOPIC (ARMC ONLY)
BILIRUBIN URINE: NEGATIVE
Bacteria, UA: NONE SEEN
GLUCOSE, UA: NEGATIVE mg/dL
Hgb urine dipstick: NEGATIVE
KETONES UR: NEGATIVE mg/dL
Leukocytes, UA: NEGATIVE
NITRITE: NEGATIVE
Protein, ur: NEGATIVE mg/dL
SPECIFIC GRAVITY, URINE: 1.004 — AB (ref 1.005–1.030)
pH: 6 (ref 5.0–8.0)

## 2016-06-03 LAB — COMPREHENSIVE METABOLIC PANEL
ALT: 22 U/L (ref 14–54)
AST: 36 U/L (ref 15–41)
Albumin: 3.7 g/dL (ref 3.5–5.0)
Alkaline Phosphatase: 75 U/L (ref 38–126)
Anion gap: 15 (ref 5–15)
BUN: 15 mg/dL (ref 6–20)
CO2: 23 mmol/L (ref 22–32)
CREATININE: 0.9 mg/dL (ref 0.44–1.00)
Calcium: 8.6 mg/dL — ABNORMAL LOW (ref 8.9–10.3)
Chloride: 103 mmol/L (ref 101–111)
GFR calc Af Amer: >60 mL/min (ref >60)
GFR calc non Af Amer: >60 mL/min (ref >60)
Glucose, Bld: 159 mg/dL — ABNORMAL HIGH (ref 65–99)
POTASSIUM: 3.8 mmol/L (ref 3.5–5.1)
Sodium: 141 mmol/L (ref 135–145)
Total Bilirubin: 0.9 mg/dL (ref 0.3–1.2)
Total Protein: 6.6 g/dL (ref 6.5–8.1)

## 2016-06-03 LAB — CBC WITH DIFFERENTIAL/PLATELET
BASOS ABS: 0.1 10*3/uL (ref 0–0.1)
Basophils Relative: 1 %
Eosinophils Absolute: 0.3 10*3/uL (ref 0–0.7)
Eosinophils Relative: 2 %
HCT: 39.7 % (ref 35.0–47.0)
Hemoglobin: 13 g/dL (ref 12.0–16.0)
LYMPHS PCT: 45 %
Lymphs Abs: 7.3 10*3/uL — ABNORMAL HIGH (ref 1.0–3.6)
MCH: 28.3 pg (ref 26.0–34.0)
MCHC: 32.7 g/dL (ref 32.0–36.0)
MCV: 86.6 fL (ref 80.0–100.0)
MONO ABS: 1.2 10*3/uL — AB (ref 0.2–0.9)
MONOS PCT: 7 %
NEUTROS ABS: 7.5 10*3/uL — AB (ref 1.4–6.5)
Neutrophils Relative %: 45 %
Platelets: 251 10*3/uL (ref 150–440)
RBC: 4.58 MIL/uL (ref 3.80–5.20)
RDW: 16.6 % — AB (ref 11.5–14.5)
WBC: 16.5 10*3/uL — ABNORMAL HIGH (ref 3.6–11.0)

## 2016-06-03 LAB — TROPONIN I: TROPONIN I: 0.04 ng/mL — AB (ref <0.03)

## 2016-06-03 LAB — BRAIN NATRIURETIC PEPTIDE: B Natriuretic Peptide: 4453 pg/mL — ABNORMAL HIGH (ref 0.0–100.0)

## 2016-06-03 LAB — LACTIC ACID, PLASMA
LACTIC ACID, VENOUS: 2.7 mmol/L — AB (ref 0.5–1.9)
Lactic Acid, Venous: 4.4 mmol/L (ref 0.5–1.9)

## 2016-06-03 LAB — MRSA PCR SCREENING: MRSA BY PCR: NEGATIVE

## 2016-06-03 LAB — MAGNESIUM: MAGNESIUM: 1.8 mg/dL (ref 1.7–2.4)

## 2016-06-03 MED ORDER — OXYCODONE-ACETAMINOPHEN 5-325 MG PO TABS
1.0000 | ORAL_TABLET | Freq: Three times a day (TID) | ORAL | Status: DC | PRN
  Administered 2016-06-03 – 2016-06-05 (×4): 1 via ORAL
  Filled 2016-06-03 (×4): qty 1

## 2016-06-03 MED ORDER — OXYCODONE HCL 5 MG PO TABS
5.0000 mg | ORAL_TABLET | Freq: Three times a day (TID) | ORAL | Status: DC | PRN
  Administered 2016-06-03 – 2016-06-05 (×5): 5 mg via ORAL
  Filled 2016-06-03 (×5): qty 1

## 2016-06-03 MED ORDER — ALBUTEROL SULFATE HFA 108 (90 BASE) MCG/ACT IN AERS: 1.0000 | INHALATION_SPRAY | Freq: Four times a day (QID) | RESPIRATORY_TRACT | Status: DC | PRN

## 2016-06-03 MED ORDER — MORPHINE SULFATE (PF) 4 MG/ML IV SOLN
INTRAVENOUS | Status: AC
  Administered 2016-06-03: 2 mg via INTRAVENOUS
  Filled 2016-06-03: qty 1

## 2016-06-03 MED ORDER — SODIUM CHLORIDE 0.9% FLUSH
3.0000 mL | Freq: Two times a day (BID) | INTRAVENOUS | Status: DC
  Administered 2016-06-03 – 2016-06-05 (×4): 3 mL via INTRAVENOUS

## 2016-06-03 MED ORDER — ALBUTEROL SULFATE (2.5 MG/3ML) 0.083% IN NEBU: 2.5000 mg | INHALATION_SOLUTION | RESPIRATORY_TRACT | Status: DC | PRN

## 2016-06-03 MED ORDER — SODIUM CHLORIDE 0.9 % IV SOLN: 250.0000 mL | INTRAVENOUS | Status: DC | PRN

## 2016-06-03 MED ORDER — NEBIVOLOL HCL 5 MG PO TABS
5.0000 mg | ORAL_TABLET | Freq: Every day | ORAL | Status: DC
  Administered 2016-06-03: 5 mg via ORAL
  Filled 2016-06-03: qty 1

## 2016-06-03 MED ORDER — ONDANSETRON HCL 4 MG/2ML IJ SOLN: 4.0000 mg | Freq: Four times a day (QID) | INTRAMUSCULAR | Status: DC | PRN

## 2016-06-03 MED ORDER — LEVALBUTEROL HCL 1.25 MG/0.5ML IN NEBU: 1.2500 mg | INHALATION_SOLUTION | RESPIRATORY_TRACT | Status: DC | PRN

## 2016-06-03 MED ORDER — IPRATROPIUM BROMIDE 0.02 % IN SOLN: 0.5000 mg | RESPIRATORY_TRACT | Status: DC | PRN

## 2016-06-03 MED ORDER — OXYCODONE HCL 5 MG PO TABS: 5.0000 mg | ORAL_TABLET | Freq: Three times a day (TID) | ORAL | Status: DC | PRN

## 2016-06-03 MED ORDER — FUROSEMIDE 10 MG/ML IJ SOLN
40.0000 mg | Freq: Once | INTRAMUSCULAR | Status: AC
  Administered 2016-06-03: 40 mg via INTRAVENOUS

## 2016-06-03 MED ORDER — ONDANSETRON HCL 4 MG/2ML IJ SOLN
INTRAMUSCULAR | Status: AC
  Administered 2016-06-03: 4 mg via INTRAVENOUS
  Filled 2016-06-03: qty 2

## 2016-06-03 MED ORDER — CITALOPRAM HYDROBROMIDE 20 MG PO TABS
20.0000 mg | ORAL_TABLET | Freq: Every day | ORAL | Status: DC
  Administered 2016-06-03 – 2016-06-05 (×3): 20 mg via ORAL
  Filled 2016-06-03 (×3): qty 1

## 2016-06-03 MED ORDER — BUDESONIDE 0.5 MG/2ML IN SUSP
0.5000 mg | Freq: Two times a day (BID) | RESPIRATORY_TRACT | Status: DC
  Administered 2016-06-03 – 2016-06-04 (×3): 0.5 mg via RESPIRATORY_TRACT
  Filled 2016-06-03 (×4): qty 2

## 2016-06-03 MED ORDER — FLUTICASONE PROPIONATE 50 MCG/ACT NA SUSP
2.0000 | Freq: Every day | NASAL | Status: DC
  Administered 2016-06-03 – 2016-06-05 (×3): 2 via NASAL
  Filled 2016-06-03: qty 16

## 2016-06-03 MED ORDER — ATORVASTATIN CALCIUM 20 MG PO TABS
40.0000 mg | ORAL_TABLET | Freq: Every day | ORAL | Status: DC
  Administered 2016-06-03 – 2016-06-04 (×2): 40 mg via ORAL
  Filled 2016-06-03 (×2): qty 2

## 2016-06-03 MED ORDER — ONDANSETRON HCL 4 MG PO TABS: 4.0000 mg | ORAL_TABLET | Freq: Four times a day (QID) | ORAL | Status: DC | PRN

## 2016-06-03 MED ORDER — LEVALBUTEROL HCL 1.25 MG/0.5ML IN NEBU
1.2500 mg | INHALATION_SOLUTION | Freq: Four times a day (QID) | RESPIRATORY_TRACT | Status: DC
  Administered 2016-06-03 – 2016-06-04 (×4): 1.25 mg via RESPIRATORY_TRACT
  Filled 2016-06-03 (×4): qty 0.5

## 2016-06-03 MED ORDER — PREDNISONE 20 MG PO TABS: 20.0000 mg | ORAL_TABLET | Freq: Every day | ORAL | Status: DC

## 2016-06-03 MED ORDER — ALPRAZOLAM 0.25 MG PO TABS
0.2500 mg | ORAL_TABLET | Freq: Three times a day (TID) | ORAL | Status: DC | PRN
  Administered 2016-06-03 – 2016-06-04 (×3): 0.25 mg via ORAL
  Filled 2016-06-03 (×3): qty 1

## 2016-06-03 MED ORDER — IPRATROPIUM BROMIDE 0.02 % IN SOLN: 0.5000 mg | Freq: Four times a day (QID) | RESPIRATORY_TRACT | Status: DC

## 2016-06-03 MED ORDER — LISINOPRIL 5 MG PO TABS
5.0000 mg | ORAL_TABLET | Freq: Every day | ORAL | Status: DC
  Administered 2016-06-03: 5 mg via ORAL
  Filled 2016-06-03: qty 1

## 2016-06-03 MED ORDER — LEVALBUTEROL HCL 1.25 MG/0.5ML IN NEBU: 1.2500 mg | INHALATION_SOLUTION | Freq: Four times a day (QID) | RESPIRATORY_TRACT | Status: DC

## 2016-06-03 MED ORDER — FUROSEMIDE 10 MG/ML IJ SOLN
40.0000 mg | Freq: Two times a day (BID) | INTRAMUSCULAR | Status: DC
  Administered 2016-06-03 (×2): 40 mg via INTRAVENOUS
  Filled 2016-06-03 (×2): qty 4

## 2016-06-03 MED ORDER — FUROSEMIDE 10 MG/ML IJ SOLN
INTRAMUSCULAR | Status: AC
  Administered 2016-06-03: 40 mg via INTRAVENOUS
  Filled 2016-06-03: qty 4

## 2016-06-03 MED ORDER — METHYLPREDNISOLONE SODIUM SUCC 125 MG IJ SOLR
125.0000 mg | Freq: Once | INTRAMUSCULAR | Status: AC
  Administered 2016-06-03: 125 mg via INTRAVENOUS
  Filled 2016-06-03: qty 2

## 2016-06-03 MED ORDER — TRAMADOL HCL 50 MG PO TABS
25.0000 mg | ORAL_TABLET | ORAL | Status: DC | PRN
  Administered 2016-06-04: 25 mg via ORAL
  Filled 2016-06-03: qty 1

## 2016-06-03 MED ORDER — MORPHINE SULFATE (PF) 4 MG/ML IV SOLN
2.0000 mg | Freq: Once | INTRAVENOUS | Status: AC
  Administered 2016-06-03: 2 mg via INTRAVENOUS

## 2016-06-03 MED ORDER — ASPIRIN 81 MG PO CHEW
81.0000 mg | CHEWABLE_TABLET | Freq: Every day | ORAL | Status: DC
  Administered 2016-06-03 – 2016-06-05 (×3): 81 mg via ORAL
  Filled 2016-06-03 (×3): qty 1

## 2016-06-03 MED ORDER — ACETAMINOPHEN 325 MG PO TABS: 650.0000 mg | ORAL_TABLET | Freq: Four times a day (QID) | ORAL | Status: DC | PRN

## 2016-06-03 MED ORDER — TIOTROPIUM BROMIDE MONOHYDRATE 18 MCG IN CAPS
18.0000 ug | ORAL_CAPSULE | Freq: Every day | RESPIRATORY_TRACT | Status: DC
  Administered 2016-06-05: 18 ug via RESPIRATORY_TRACT
  Filled 2016-06-03: qty 5

## 2016-06-03 MED ORDER — OXYCODONE-ACETAMINOPHEN 10-325 MG PO TABS: 1.0000 | ORAL_TABLET | Freq: Three times a day (TID) | ORAL | Status: DC | PRN

## 2016-06-03 MED ORDER — IPRATROPIUM BROMIDE 0.02 % IN SOLN
0.5000 mg | Freq: Four times a day (QID) | RESPIRATORY_TRACT | Status: DC
  Administered 2016-06-03 – 2016-06-04 (×4): 0.5 mg via RESPIRATORY_TRACT
  Filled 2016-06-03 (×4): qty 2.5

## 2016-06-03 MED ORDER — ALUM & MAG HYDROXIDE-SIMETH 200-200-20 MG/5ML PO SUSP
5.0000 mL | Freq: Three times a day (TID) | ORAL | Status: DC | PRN
Start: 2016-06-03 — End: 2016-06-05
  Administered 2016-06-03 – 2016-06-05 (×3): 5 mL via ORAL
  Filled 2016-06-03 (×3): qty 30

## 2016-06-03 MED ORDER — ONDANSETRON HCL 4 MG/2ML IJ SOLN
4.0000 mg | Freq: Once | INTRAMUSCULAR | Status: AC
  Administered 2016-06-03: 4 mg via INTRAVENOUS

## 2016-06-03 MED ORDER — OXYCODONE-ACETAMINOPHEN 5-325 MG PO TABS: 1.0000 | ORAL_TABLET | Freq: Three times a day (TID) | ORAL | Status: DC | PRN

## 2016-06-03 MED ORDER — BUDESONIDE 0.5 MG/2ML IN SUSP: 0.5000 mg | Freq: Two times a day (BID) | RESPIRATORY_TRACT | Status: DC

## 2016-06-03 MED ORDER — ORAL CARE MOUTH RINSE
15.0000 mL | Freq: Two times a day (BID) | OROMUCOSAL | Status: DC
  Administered 2016-06-03 (×2): 15 mL via OROMUCOSAL

## 2016-06-03 MED ORDER — ACETAMINOPHEN 650 MG RE SUPP: 650.0000 mg | Freq: Four times a day (QID) | RECTAL | Status: DC | PRN

## 2016-06-03 MED ORDER — ENOXAPARIN SODIUM 40 MG/0.4ML ~~LOC~~ SOLN
40.0000 mg | SUBCUTANEOUS | Status: DC
  Administered 2016-06-03 – 2016-06-04 (×2): 40 mg via SUBCUTANEOUS
  Filled 2016-06-03 (×2): qty 0.4

## 2016-06-03 MED ORDER — FLUTICASONE FUROATE-VILANTEROL 100-25 MCG/INH IN AEPB: 1.0000 | INHALATION_SPRAY | Freq: Every day | RESPIRATORY_TRACT | Status: DC

## 2016-06-03 MED ORDER — SODIUM CHLORIDE 0.9% FLUSH: 3.0000 mL | INTRAVENOUS | Status: DC | PRN

## 2016-06-03 MED ORDER — DIPHENHYDRAMINE HCL 25 MG PO CAPS
25.0000 mg | ORAL_CAPSULE | Freq: Four times a day (QID) | ORAL | Status: DC | PRN
  Administered 2016-06-03 – 2016-06-05 (×4): 25 mg via ORAL
  Filled 2016-06-03 (×4): qty 1

## 2016-06-03 MED ORDER — SODIUM CHLORIDE 0.9% FLUSH
3.0000 mL | Freq: Two times a day (BID) | INTRAVENOUS | Status: DC
  Administered 2016-06-04: 3 mL via INTRAVENOUS

## 2016-06-03 MED ORDER — PREDNISONE 20 MG PO TABS
20.0000 mg | ORAL_TABLET | Freq: Every day | ORAL | Status: DC
  Administered 2016-06-03 – 2016-06-05 (×3): 20 mg via ORAL
  Filled 2016-06-03 (×3): qty 1

## 2016-06-03 MED ORDER — PANTOPRAZOLE SODIUM 40 MG PO TBEC
40.0000 mg | DELAYED_RELEASE_TABLET | Freq: Every day | ORAL | Status: DC
  Administered 2016-06-03 – 2016-06-05 (×3): 40 mg via ORAL
  Filled 2016-06-03 (×3): qty 1

## 2016-06-03 NOTE — ED Notes (Signed)
Critical Care MD at bedside, Pt taken off of Bipap and placed on 2L via [place] per MD request.

## 2016-06-03 NOTE — ED Provider Notes (Signed)
[facility] Emergency Department Provider Note   ____________________________________________   First MD Initiated Contact with Patient 06/03/16 [phone]     (approximate)  I have reviewed the triage vital signs and the nursing notes.   HISTORY  Chief Complaint Respiratory distress  Limited by respiratory distress.  HPI [name] [name] is a 70 y.o. female who presents [facility] the ED fromSNF with a chief complaint respiratory distress. Patient has a history of COPD, CHF, not on oxygen at baseline who presents with progressive shortness of breath overnight. Also complains of associated chest tightness. Arrives to the ED in moderate respiratory distress on CPAP. Received 2 DuoNeb's per EMS en route. The rest of history is limited secondary to patient's distress.   Past Medical History:  Diagnosis Date  . CHF (congestive heart failure) (HCC)   . Lung nodule     There are no active problems to display for this patient.   History reviewed. No pertinent surgical history.  Prior to Admission medications   Not on File    Allergies Sulfa antibiotics  History reviewed. No pertinent family history.  Social History Social History  Substance Use Topics  . Smoking status: Former Games developermoker  . Smokeless tobacco: Never Used  . Alcohol use No    Review of Systems  Constitutional: No fever/chills. Eyes: No visual changes. ENT: No sore throat. Cardiovascular: Positive for chest pain. Respiratory: Positive for shortness of breath. Gastrointestinal: No abdominal pain.  No nausea, no vomiting.  No diarrhea.  No constipation. Genitourinary: Negative for dysuria. Musculoskeletal: Negative for back pain. Skin: Negative for rash. Neurological: Negative for headaches, focal weakness or numbness.  10-point ROS otherwise negative.  ____________________________________________   PHYSICAL EXAM:  VITAL SIGNS: ED Triage Vitals [06/03/16 0627]  Enc Vitals Group    BP      Pulse      Resp      Temp      Temp src      SpO2 90 %     Weight      Height      Head Circumference      Peak Flow      Pain Score      Pain Loc      Pain Edu?      Excl. in GC?     Constitutional: Alert and oriented. Ill appearing and in moderate acute distress. Anxious. Eyes: Conjunctivae are normal. PERRL. EOMI. Head: Atraumatic. Nose: No congestion/rhinnorhea. Mouth/Throat: Mucous membranes are moist.  Oropharynx non-erythematous. Neck: No stridor.   Cardiovascular: Tachycardic rate, regular rhythm. Grossly normal heart sounds.  Good peripheral circulation. Respiratory: Increased respiratory effort. Retractions. Lungs with diffuse rales. Gastrointestinal: Soft and nontender. No distention. No abdominal bruits. No CVA tenderness. Musculoskeletal: No lower extremity tenderness nor edema.  No joint effusions. Neurologic:  Normal speech and language. No gross focal neurologic deficits are appreciated.  Skin:  Skin is cool, dry and intact. No rash noted. Psychiatric: Mood and affect are anxious. Speech and behavior are normal.  ____________________________________________   LABS (all labs ordered are listed, but only abnormal results are displayed)  Labs Reviewed  CBC WITH DIFFERENTIAL/PLATELET - Abnormal; Notable for the following:       Result Value   WBC 16.5 (*)    RDW 16.6 (*)    Neutro Abs 7.5 (*)    Lymphs Abs 7.3 (*)    Monocytes Absolute 1.2 (*)    All other components within normal limits  COMPREHENSIVE METABOLIC PANEL  TROPONIN I  BRAIN NATRIURETIC PEPTIDE  LACTIC ACID, PLASMA  LACTIC ACID, PLASMA  BLOOD GAS, ARTERIAL   ____________________________________________  EKG  ED ECG REPORT I, Brylen Wagar J, the attending physician, personally viewed and interpreted this ECG.   Date: 06/03/2016  EKG Time: 0631  Rate: 160  Rhythm: SVT  Axis: Within normal limits  Intervals:none  ST&T Change:  Nonspecific  ____________________________________________  RADIOLOGY  Pending - viewed by me, consistent with CHF ____________________________________________   PROCEDURES  Procedure(s) performed: None  Procedures  Critical Care performed: Yes, see critical care note(s)   CRITICAL CARE Performed by: Irean Hong   Total critical care time: 30 minutes  Critical care time was exclusive of separately billable procedures and treating other patients.  Critical care was necessary to treat or prevent imminent or life-threatening deterioration.  Critical care was time spent personally by me on the following activities: development of treatment plan with patient and/or surrogate as well as nursing, discussions with consultants, evaluation of patient's response to treatment, examination of patient, obtaining history from patient or surrogate, ordering and performing treatments and interventions, ordering and review of laboratory studies, ordering and review of radiographic studies, pulse oximetry and re-evaluation of patient's condition.  ____________________________________________   INITIAL IMPRESSION / ASSESSMENT AND PLAN / ED COURSE  Pertinent labs & imaging results that were available during my care of the patient were reviewed by me and considered in my medical decision making (see chart for details).  70 year old female with a history of COPD, CHF who presents in acute respiratory distress. She is extremely anxious upon arrival, on CPAP. States morphine usually is helpful to calm her anxiety secondary to respiratory distress. Erratic heart rate on monitor from 130 to 160s. Suspect underlying atrial fibrillation rather than supraventricular tachycardia. Would not push adenosine at this time. Will initiate Solu-Medrol, BiPAP, screening lab work, chest x-ray. Consider diuretic after chest x-ray. Anticipate hospitalization.  Clinical Course as of Jun 04 707  Wed Jun 03, 2016  5009  Patient starting to settle down after morphine. Repeat EKG demonstrates sinus tachycardia at a rate of 123. Patient tells me she was hospitalized and intubated at [facility] nearly 3 weeks ago. For some reason I am unable to see a record of this hospitalization in Care Everywhere.  [JS]  0658 Heart rate 109. Wet read of chest x-ray consistent with CHF. Will add 40 mg IV Lasix. Care transferred to Dr. Alphonzo Lemmings.  [JS]    Clinical Course User Index [JS] Irean Hong, MD     ____________________________________________   FINAL CLINICAL IMPRESSION(S) / ED DIAGNOSES  Final diagnoses:  Respiratory distress  COPD exacerbation (HCC)  Acute on chronic congestive heart failure, unspecified congestive heart failure type (HCC)      NEW MEDICATIONS STARTED DURING THIS VISIT:  New Prescriptions   No medications on file     Note:  This document was prepared using Dragon voice recognition software and may include unintentional dictation errors.    Irean Hong, MD 06/03/16 2302

## 2016-06-03 NOTE — Consult Note (Signed)
PULMONARY / CRITICAL CARE MEDICINE   Name: [name] [name] MRN: [id number] DOB: 06/02/1946    ADMISSION DATE:  06/03/2016 CONSULTATION DATE:  06/03/16  REFERRING MD :  Dr. Alphonzo LemmingsMcShane   CHIEF COMPLAINT:     Short of breath   HISTORY OF PRESENT ILLNESS    70 yo Female past medical history of COPD, tobacco abuse, suspected CHF, now admitted with acute respiratory distress requiring intermittent BiPAP. Patient is a poor historian, there is no family at bedside at this time. Per patient, [facility] was [facility] after suffering a possible cardiac versus respiratory arrest, in any event she was intubated for a number of days, discharge and transfer to a rehabilitation facility. At the rehabilitation facility she states that she has not gotten her Lasix in about 4-5 days, and nebulizer treatments haven't been given like they should. This morning at about 4-5 AM, she had intense shortness of breath and was transferred to the ER. Chest x-ray shows bilateral interstitial edema along with a BNP greater than 4000 consistent with congestive heart failure. At the time of my arrival in the ER, she was noted to be on BiPAP saturating 100%, she has been on BiPAP 1.5 hours, she was taken off a place on 2 L and kept O2 sats greater than 88% at time. She states that her left ear is also been hurting for the past 2-3 days.   SIGNIFICANT EVENTS   11/8>Shortness of breath at the rehabilitation facility, placed on intermittent BiPAP, transition to 2 L nasal cannula saturating well now. BNP greater than 4000, chest x-ray bilateral interstitial edema and vascular engorgement   PAST MEDICAL HISTORY    :  Past Medical History:  Diagnosis Date  . CHF (congestive heart failure) (HCC)   . Lung nodule    History reviewed. No pertinent surgical history. Prior to Admission medications   Medication Sig Start Date End Date Taking? Authorizing Provider  albuterol (PROVENTIL HFA;VENTOLIN HFA) 108 (90  Base) MCG/ACT inhaler Inhale 1 puff into the lungs every 6 (six) hours as needed for wheezing or shortness of breath.   Yes Historical Provider, MD  alum & mag hydroxide-simeth (MAALOX/MYLANTA) 200-200-20 MG/5ML suspension Take 5 mLs by mouth every 8 (eight) hours as needed for indigestion or heartburn.   Yes Historical Provider, MD  aspirin 81 MG chewable tablet Chew 81 mg by mouth daily.   Yes Historical Provider, MD  atorvastatin (LIPITOR) 40 MG tablet Take 40 mg by mouth daily at 6 PM.   Yes Historical Provider, MD  citalopram (CELEXA) 20 MG tablet Take 20 mg by mouth daily.   Yes Historical Provider, MD  diphenhydrAMINE (BENADRYL) 25 mg capsule Take 25 mg by mouth every 6 (six) hours as needed.   Yes Historical Provider, MD  fluticasone (FLONASE) 50 MCG/ACT nasal spray Place 2 sprays into both nostrils daily.   Yes Historical Provider, MD  fluticasone furoate-vilanterol (BREO ELLIPTA) 100-25 MCG/INH AEPB Inhale 1 puff into the lungs daily.   Yes Historical Provider, MD  furosemide (LASIX) 20 MG tablet Take 20 mg by mouth daily.   Yes Historical Provider, MD  lisinopril (PRINIVIL,ZESTRIL) 10 MG tablet Take 10 mg by mouth daily.   Yes Historical Provider, MD  nebivolol (BYSTOLIC) 5 MG tablet Take 5 mg by mouth daily.   Yes Historical Provider, MD  omeprazole (PRILOSEC) 20 MG capsule Take 20 mg by mouth daily.   Yes Historical Provider, MD  oxyCODONE-acetaminophen (PERCOCET) 10-325 MG tablet  Take 1 tablet by mouth every 8 (eight) hours as needed for pain.   Yes Historical Provider, MD  predniSONE (DELTASONE) 20 MG tablet Take 20 mg by mouth daily with breakfast.   Yes Historical Provider, MD  tiotropium (SPIRIVA) 18 MCG inhalation capsule Place 18 mcg into inhaler and inhale daily.   Yes Historical Provider, MD  traMADol (ULTRAM) 50 MG tablet Take 25 mg by mouth every 4 (four) hours as needed.   Yes Historical Provider, MD   Allergies  Allergen Reactions  . Sulfa Antibiotics Itching      FAMILY HISTORY   History reviewed. No pertinent family history.    SOCIAL HISTORY    reports that she has quit smoking. She has never used smokeless tobacco. She reports that she does not drink alcohol. Her drug history is not on file.  Review of Systems  Constitutional: Negative for chills and fever.  HENT: Positive for ear pain and tinnitus.   Eyes: Negative for blurred vision, double vision and photophobia.  Respiratory: Positive for cough, sputum production, shortness of breath and wheezing.   Cardiovascular: Negative for chest pain.  Gastrointestinal: Negative for abdominal pain, diarrhea, heartburn, nausea and vomiting.  Genitourinary: Negative for dysuria.  Musculoskeletal: Negative for myalgias.  Skin: Negative for rash.  Neurological: Negative for dizziness.  Endo/Heme/Allergies: Does not bruise/bleed easily.  Psychiatric/Behavioral: Negative for depression.      VITAL SIGNS    Temp:  [97.7 F (36.5 C)] 97.7 F (36.5 C) (11/08 6659) Pulse Rate:  [35-128] 102 (11/08 0732) Resp:  [22-38] 24 (11/08 0732) BP: (91-131)/(58-98) 115/68 (11/08 0732) SpO2:  [90 %-99 %] 99 % (11/08 0732) Weight:  [140 lb (63.5 kg)] 140 lb (63.5 kg) (11/08 0631) HEMODYNAMICS:   VENTILATOR SETTINGS:   INTAKE / OUTPUT: No intake or output data in the 24 hours ending 06/03/16 0814     PHYSICAL EXAM   Physical Exam  Constitutional: She is oriented to person, place, and time. She appears well-developed and well-nourished. No distress.  HENT:  Head: Normocephalic and atraumatic.  Right Ear: External ear normal.  Left Ear: External ear normal.  Left ear, tympanic membrane moderately opacified, no erythema or drainage. Right ear-canal without any erythema or drainage, could not visualize tympanic membrane  Eyes: Conjunctivae and EOM are normal. Pupils are equal, round, and reactive to light.  Neck: Normal range of motion. Neck supple. No thyromegaly present.  Cardiovascular:  Normal rate, regular rhythm and intact distal pulses.  Exam reveals friction rub.   No murmur heard. Pulmonary/Chest: No respiratory distress. She has wheezes. She has rales.  Anterior chest wheezing. Decreased basilar breath sounds, mild fine crackles at the basis  Abdominal: Soft. Bowel sounds are normal. She exhibits no distension. There is no tenderness.  Musculoskeletal: Normal range of motion. She exhibits no edema.  Neurological: She is alert and oriented to person, place, and time.  Skin: Skin is warm and dry.  Nursing note and vitals reviewed.      LABS   LABS:  CBC  Recent Labs Lab 06/03/16 0625  WBC 16.5*  HGB 13.0  HCT 39.7  PLT 251   Coag's No results for input(s): APTT, INR in the last 168 hours. BMET  Recent Labs Lab 06/03/16 0625  NA 141  K 3.8  CL 103  CO2 23  BUN 15  CREATININE 0.90  GLUCOSE 159*   Electrolytes  Recent Labs Lab 06/03/16 0625  CALCIUM 8.6*   Sepsis Markers  Recent Labs Lab 06/03/16  1610  LATICACIDVEN 4.4*   ABG No results for input(s): PHART, PCO2ART, PO2ART in the last 168 hours. Liver Enzymes  Recent Labs Lab 06/03/16 0625  AST 36  ALT 22  ALKPHOS 75  BILITOT 0.9  ALBUMIN 3.7   Cardiac Enzymes  Recent Labs Lab 06/03/16 0625  TROPONINI 0.04*   Glucose No results for input(s): GLUCAP in the last 168 hours.   No results found for this or any previous visit (from the past 240 hour(s)).  No current facility-administered medications for this encounter.   Current Outpatient Prescriptions:  .  albuterol (PROVENTIL HFA;VENTOLIN HFA) 108 (90 Base) MCG/ACT inhaler, Inhale 1 puff into the lungs every 6 (six) hours as needed for wheezing or shortness of breath., Disp: , Rfl:  .  alum & mag hydroxide-simeth (MAALOX/MYLANTA) 200-200-20 MG/5ML suspension, Take 5 mLs by mouth every 8 (eight) hours as needed for indigestion or heartburn., Disp: , Rfl:  .  aspirin 81 MG chewable tablet, Chew 81 mg by mouth  daily., Disp: , Rfl:  .  atorvastatin (LIPITOR) 40 MG tablet, Take 40 mg by mouth daily at 6 PM., Disp: , Rfl:  .  citalopram (CELEXA) 20 MG tablet, Take 20 mg by mouth daily., Disp: , Rfl:  .  diphenhydrAMINE (BENADRYL) 25 mg capsule, Take 25 mg by mouth every 6 (six) hours as needed., Disp: , Rfl:  .  fluticasone (FLONASE) 50 MCG/ACT nasal spray, Place 2 sprays into both nostrils daily., Disp: , Rfl:  .  fluticasone furoate-vilanterol (BREO ELLIPTA) 100-25 MCG/INH AEPB, Inhale 1 puff into the lungs daily., Disp: , Rfl:  .  furosemide (LASIX) 20 MG tablet, Take 20 mg by mouth daily., Disp: , Rfl:  .  lisinopril (PRINIVIL,ZESTRIL) 10 MG tablet, Take 10 mg by mouth daily., Disp: , Rfl:  .  nebivolol (BYSTOLIC) 5 MG tablet, Take 5 mg by mouth daily., Disp: , Rfl:  .  omeprazole (PRILOSEC) 20 MG capsule, Take 20 mg by mouth daily., Disp: , Rfl:  .  oxyCODONE-acetaminophen (PERCOCET) 10-325 MG tablet, Take 1 tablet by mouth every 8 (eight) hours as needed for pain., Disp: , Rfl:  .  predniSONE (DELTASONE) 20 MG tablet, Take 20 mg by mouth daily with breakfast., Disp: , Rfl:  .  tiotropium (SPIRIVA) 18 MCG inhalation capsule, Place 18 mcg into inhaler and inhale daily., Disp: , Rfl:  .  traMADol (ULTRAM) 50 MG tablet, Take 25 mg by mouth every 4 (four) hours as needed., Disp: , Rfl:   IMAGING    Dg Chest Port 1 View  Result Date: 06/03/2016 CLINICAL DATA:  70 year old with respiratory distress. EXAM: PORTABLE CHEST 1 VIEW COMPARISON:  11/27/2014 FINDINGS: There is diffuse interstitial thickening throughout both lungs. No focal areas of consolidation. Heart size is mildly enlarged but stable. Atherosclerotic calcifications at the aortic arch. Surgical plate in the right clavicle. Negative for a pneumothorax. Trachea is midline. There is stable scarring at the left lung apex. IMPRESSION: Diffuse enlargement of interstitial markings in both lungs. Findings are most suggestive for interstitial pulmonary  edema. Atypical pneumonia would be in the differential. Electronically Signed   By: Richarda Overlie M.D.   On: 06/03/2016 07:21      Indwelling Urinary Catheter continued, requirement due to   Reason to continue Indwelling Urinary Catheter for strict Intake/Output monitoring for hemodynamic instability   Central Line continued, requirement due to   Reason to continue [employer] Monitoring of central venous pressure or other hemodynamic parameters   Ventilator  continued, requirement due to, resp failure    Ventilator Sedation RASS 0 to -2   Cultures: BCx2  UC  Sputum  Antibiotics:  Lines:   ASSESSMENT/PLAN  Discussion: 70 year old female, former smoker, history of COPD, CHF, now with acute CHF exacerbation requiring intermittent BiPAP initially, now switched to nasal cannula.  Assessment: Respiratory distress-resolving CHF exacerbation Wheezing COPD Tobacco abuse Leukocytosis-reactive  Plan: -Intermittent BiPAP as needed, currently not requiring BiPAP, does not wear oxygen at home, -Keep O2 saturations greater than 88%, supplemental oxygen as needed -Schedule DuoNeb's or Xopenex pending which one patient tolerates -May start by mouth steroid, would recommend low-dose taper given that this is more of a CHF exacerbation than COPD -Tobacco counseling done -She has quit smoking 2.5 months ago -I have reviewed her chest x-ray in detail, findings consistent with interstitial edema and bilateral vascular engorgement, mostly CHF  Thank you for consult, we will continue to follow-up consultation.    I have personally obtained a history, examined the patient, evaluated laboratory and imaging results, formulated the assessment and plan and placed orders.  Pulmonary  Care Time devoted to patient care services described in this note is 45 minutes.  Patient at high risk for cardiac arrest and death.   Stephanie Acre, MD [facility] Pager [phone]  (please enter 7-digits) On Call Pager - [phone] (please enter 7-digits)     06/03/2016, 8:14 AM  Note: This note was prepared with Dragon dictation along with smaller phrase technology. Any transcriptional errors that result from this process are unintentional.

## 2016-06-03 NOTE — Clinical Social Work Note (Addendum)
MSW received consult that patient is a short term rehab patient from [facility].  MSW to meet with patient and complete assessment.    3:30pm  MSW met with patient she states she has been at [facility] and plans to return back to SNF to continue her rehab.  Patient states she would like to return as soon as she can to continue with therapy.  Formal assessment to follow, patient is alert and oriented x4 and able to express her feelings.  Jones Broom. Yordy Matton, MSW [phone]  Mon-Fri 8a-4:30p 06/03/2016 2:01 PM

## 2016-06-03 NOTE — ED Triage Notes (Signed)
Pt arrived by EMS from [facility] with c/o SOB and on CPAP. EMS reports pt woke this morning with SOB, pt was given 1 nebulizer treatment in route, no change in breathing. Upon arrival pt on CPAP, RR at 40, O2 at 93%. Pt is not able to speak in full sentences due to labored breathing. MD, RNx3 and respiratory at bedside

## 2016-06-03 NOTE — Progress Notes (Signed)
Pt. admitted to unit, rm260 from ED, report from Lorella Nimrod. RN. Oriented to room, call bell, Ascom phones and staff. Bed in low position. Fall safety plan reviewed, brown non-skid socks in place, bed alarm on. Full assessment to Epic; skin assessed with Darletta Moll, RN; skin intact, ecchymosis noted to L anterior/upper thigh, patient states its from "my procedure at [facility]". Telemetry box verified with CCMD and Oren Bracket, NT: [phone] . Will continue to monitor.

## 2016-06-03 NOTE — Progress Notes (Signed)
Dr. Welton Flakes called back, made aware of consult and new afib.

## 2016-06-03 NOTE — ED Notes (Signed)
Report from Vanessa, RN

## 2016-06-03 NOTE — Progress Notes (Signed)
RN review of admitting strip revealed patient is in afib, rate 70s-80s. No notes document a history of afib. Dr. Imogene Burn text paged and made aware, Dr. Welton Flakes paged and awaiting call back.

## 2016-06-03 NOTE — ED Provider Notes (Signed)
-----------------------------------------   7:32 AM on 06/03/2016 -----------------------------------------  Signed out to me at 7 am. Pt with hx of copd and chf came in in respiratory distress.  On bipap now.  Hx per pt of being intubated at [facility] 2 weeks ago but these records not available.  Pt doing better on bipap. rr improving. bp stable at this time. She did have 2 mg of ms04 to limit her anxiety.  There is an elevated lactate but I do not think she has sepsis and aggressive fluid hydration would not be indicated for her as her findings point strongly to chf. I have d/w the intensivist who agrees w this plan and will come eval pt. She at this time is feeling better.    Jeanmarie Plant, MD 06/03/16 [phone]

## 2016-06-03 NOTE — Progress Notes (Signed)
Patients family at bedside, contact numbers updated (son and sisters numbers provided by son, Jorja Loa). Password set up per family request (patient had previously declined but agreed after family arrival).

## 2016-06-03 NOTE — H&P (Signed)
[facility] [place] [facility]   PATIENT NAME: [name] [name]    MR#:  [id number]  DATE OF BIRTH:  [birth year]  DATE OF ADMISSION:  06/03/2016  PRIMARY CARE PHYSICIAN: No PCP Per Patient   REQUESTING/REFERRING PHYSICIAN: Jeanmarie Plant, MD  CHIEF COMPLAINT:   Chief Complaint  Patient presents with  . Shortness of Breath   SOB this am. HISTORY OF PRESENT ILLNESS:  Cybil Senegal  is a 70 y.o. female with a known history of CHF, HTN, COPD, lung nodule.The patient presents to the ED from nursing home due to shortness of breath and respiratory distress. She denies any fever or chills but has cough, shortness of breath, orthopnea and chest tightness.she was at [facility] about 2 weeks ago after suffering a possible cardiac versus respiratory arrest, in any event she was intubated for a number of days, discharge and transfer to a rehabilitation facility. At the rehabilitation facility she states that she has not gotten her Lasix in about 4-5 days, and nebulizer treatments haven't been given like they should. Chest x-ray show pulmonary edema. She was put on BiPAP for one and half hour, then changed to oxygen by nasal cannula.  PAST MEDICAL HISTORY:   Past Medical History:  Diagnosis Date  . CHF (congestive heart failure) (HCC)   . COPD (chronic obstructive pulmonary disease) (HCC)   . Hypertension   . Lung nodule     PAST SURGICAL HISTORY:   Past Surgical History:  Procedure Laterality Date  . REPLACEMENT TOTAL KNEE      SOCIAL HISTORY:   Social History  Substance Use Topics  . Smoking status: Former Games developer  . Smokeless tobacco: Never Used  . Alcohol use No    FAMILY HISTORY:  History reviewed. No pertinent family history. To patient's parents diseased. She doesn't know family history.  DRUG ALLERGIES:   Allergies  Allergen Reactions  . Sulfa Antibiotics Itching    REVIEW OF SYSTEMS:   Review of Systems  Constitutional: Positive for  malaise/fatigue. Negative for chills and fever.  HENT: Negative for congestion.   Eyes: Negative for blurred vision and double vision.  Respiratory: Positive for cough, sputum production and shortness of breath. Negative for hemoptysis, wheezing and stridor.   Cardiovascular: Positive for orthopnea. Negative for chest pain, palpitations and leg swelling.  Gastrointestinal: Negative for abdominal pain, diarrhea, nausea and vomiting.  Genitourinary: Negative for dysuria and hematuria.  Musculoskeletal: Negative for joint pain.  Skin: Negative for itching and rash.  Neurological: Positive for weakness. Negative for dizziness, focal weakness and loss of consciousness.  Psychiatric/Behavioral: Negative for depression. The patient is nervous/anxious.     MEDICATIONS AT HOME:   Prior to Admission medications   Medication Sig Start Date End Date Taking? Authorizing Provider  albuterol (PROVENTIL HFA;VENTOLIN HFA) 108 (90 Base) MCG/ACT inhaler Inhale 1 puff into the lungs every 6 (six) hours as needed for wheezing or shortness of breath.   Yes Historical Provider, MD  alum & mag hydroxide-simeth (MAALOX/MYLANTA) 200-200-20 MG/5ML suspension Take 5 mLs by mouth every 8 (eight) hours as needed for indigestion or heartburn.   Yes Historical Provider, MD  aspirin 81 MG chewable tablet Chew 81 mg by mouth daily.   Yes Historical Provider, MD  atorvastatin (LIPITOR) 40 MG tablet Take 40 mg by mouth daily at 6 PM.   Yes Historical Provider, MD  citalopram (CELEXA) 20 MG tablet Take 20 mg by mouth daily.   Yes Historical Provider, MD  diphenhydrAMINE (BENADRYL) 25 mg capsule Take 25 mg by mouth every 6 (six) hours as needed.   Yes Historical Provider, MD  fluticasone (FLONASE) 50 MCG/ACT nasal spray Place 2 sprays into both nostrils daily.   Yes Historical Provider, MD  fluticasone furoate-vilanterol (BREO ELLIPTA) 100-25 MCG/INH AEPB Inhale 1 puff into the lungs daily.   Yes Historical Provider, MD    furosemide (LASIX) 20 MG tablet Take 20 mg by mouth daily.   Yes Historical Provider, MD  lisinopril (PRINIVIL,ZESTRIL) 10 MG tablet Take 10 mg by mouth daily.   Yes Historical Provider, MD  nebivolol (BYSTOLIC) 5 MG tablet Take 5 mg by mouth daily.   Yes Historical Provider, MD  omeprazole (PRILOSEC) 20 MG capsule Take 20 mg by mouth daily.   Yes Historical Provider, MD  oxyCODONE-acetaminophen (PERCOCET) 10-325 MG tablet Take 1 tablet by mouth every 8 (eight) hours as needed for pain.   Yes Historical Provider, MD  predniSONE (DELTASONE) 20 MG tablet Take 20 mg by mouth daily with breakfast.   Yes Historical Provider, MD  tiotropium (SPIRIVA) 18 MCG inhalation capsule Place 18 mcg into inhaler and inhale daily.   Yes Historical Provider, MD  traMADol (ULTRAM) 50 MG tablet Take 25 mg by mouth every 4 (four) hours as needed.   Yes Historical Provider, MD      VITAL SIGNS:  Blood pressure 115/68, pulse (!) 102, temperature 97.7 F (36.5 C), temperature source Oral, resp. rate (!) 24, height 5\' 2"  (1.575 m), weight 140 lb (63.5 kg), SpO2 99 %.  PHYSICAL EXAMINATION:  Physical Exam  GENERAL:  70 y.o.-year-old patient lying in the bed with no acute distress.  EYES: Pupils equal, round, reactive to light and accommodation. No scleral icterus. Extraocular muscles intact.  HEENT: Head atraumatic, normocephalic. Oropharynx and nasopharynx clear.  NECK:  Supple, no jugular venous distention. No thyroid enlargement, no tenderness.  LUNGS: Normal breath sounds bilaterally, no wheezing, but has bilateral rales, no rhonchi or crepitation. No use of accessory muscles of respiration.  CARDIOVASCULAR: S1, S2 normal. No murmurs, rubs, or gallops.  ABDOMEN: Soft, nontender, nondistended. Bowel sounds present. No organomegaly or mass.  EXTREMITIES: No pedal edema, cyanosis, or clubbing.  NEUROLOGIC: Cranial nerves II through XII are intact. Muscle strength 5/5 in all extremities. Sensation intact. Gait not  checked.  PSYCHIATRIC: The patient is alert and oriented x 3.  SKIN: No obvious rash, lesion, or ulcer.   LABORATORY PANEL:   CBC  Recent Labs Lab 06/03/16 0625  WBC 16.5*  HGB 13.0  HCT 39.7  PLT 251   ------------------------------------------------------------------------------------------------------------------  Chemistries   Recent Labs Lab 06/03/16 0625  NA 141  K 3.8  CL 103  CO2 23  GLUCOSE 159*  BUN 15  CREATININE 0.90  CALCIUM 8.6*  AST 36  ALT 22  ALKPHOS 75  BILITOT 0.9   ------------------------------------------------------------------------------------------------------------------  Cardiac Enzymes  Recent Labs Lab 06/03/16 0625  TROPONINI 0.04*   ------------------------------------------------------------------------------------------------------------------  RADIOLOGY:  Dg Chest Port 1 View  Result Date: 06/03/2016 CLINICAL DATA:  70 year old with respiratory distress. EXAM: PORTABLE CHEST 1 VIEW COMPARISON:  11/27/2014 FINDINGS: There is diffuse interstitial thickening throughout both lungs. No focal areas of consolidation. Heart size is mildly enlarged but stable. Atherosclerotic calcifications at the aortic arch. Surgical plate in the right clavicle. Negative for a pneumothorax. Trachea is midline. There is stable scarring at the left lung apex. IMPRESSION: Diffuse enlargement of interstitial markings in both lungs. Findings are most suggestive for interstitial pulmonary edema. Atypical  pneumonia would be in the differential. Electronically Signed   By: Richarda OverlieAdam  Henn M.D.   On: 06/03/2016 07:21      IMPRESSION AND PLAN:   Acute respiratory failure with hypoxemia. Continue oxygen nasal cannular, NEB and home nebulizer.  Acute on chronic CHF, possible diastolic. Start Lasix 40 mg IV twice a day, CHF protocol, echocardiogram and a cardiology consult.  Lactic acidosis. Follow-up lactic acid. Leukocytosis, possible due to reaction.  Follow-up urinalysis and the CBC. COPD. Continue home nebulizer. Stable.  Hypertension, continue patient home hypertension medication. Anxiety. The next when necessary.  I discussed with Dr. Dema SeverinMungal. All the records are reviewed and case discussed with ED provider. Management plans discussed with the patient, family and they are in agreement.  CODE STATUS: Full code  TOTAL TIME TAKING CARE OF THIS PATIENT: 56 minutes.    Shaune Pollackhen, Lorain Keast M.D on 06/03/2016 at 8:46 AM  Between 7am to 6pm - Pager - [phone]  After 6pm go to [email or web address] - Scientist, research (life sciences)password [email or web address]  [facility] [place] [facility]  Office  [phone]  CC: Primary care physician; No PCP Per Patient   Note: This dictation was prepared with Dragon dictation along with smaller phrase technology. Any transcriptional errors that result from this process are unintentional.

## 2016-06-03 NOTE — ED Notes (Signed)
Pt placed on BIPAP

## 2016-06-04 LAB — BASIC METABOLIC PANEL
ANION GAP: 9 (ref 5–15)
BUN: 19 mg/dL (ref 6–20)
CALCIUM: 8.2 mg/dL — AB (ref 8.9–10.3)
CO2: 33 mmol/L — ABNORMAL HIGH (ref 22–32)
Chloride: 99 mmol/L — ABNORMAL LOW (ref 101–111)
Creatinine, Ser: 0.97 mg/dL (ref 0.44–1.00)
GFR calc Af Amer: >60 mL/min (ref >60)
GFR, EST NON AFRICAN AMERICAN: 57 mL/min — AB (ref >60)
Glucose, Bld: 122 mg/dL — ABNORMAL HIGH (ref 65–99)
POTASSIUM: 4.1 mmol/L (ref 3.5–5.1)
SODIUM: 141 mmol/L (ref 135–145)

## 2016-06-04 LAB — ECHOCARDIOGRAM COMPLETE
HEIGHTINCHES: 62 in
WEIGHTICAEL: 2240 [oz_av]

## 2016-06-04 LAB — CBC
HEMATOCRIT: 31.7 % — AB (ref 35.0–47.0)
HEMOGLOBIN: 10.4 g/dL — AB (ref 12.0–16.0)
MCH: 27.7 pg (ref 26.0–34.0)
MCHC: 32.8 g/dL (ref 32.0–36.0)
MCV: 84.4 fL (ref 80.0–100.0)
Platelets: 169 10*3/uL (ref 150–440)
RBC: 3.76 MIL/uL — ABNORMAL LOW (ref 3.80–5.20)
RDW: 16.4 % — AB (ref 11.5–14.5)
WBC: 8 10*3/uL (ref 3.6–11.0)

## 2016-06-04 LAB — GLUCOSE, CAPILLARY
Glucose-Capillary: 100 mg/dL — ABNORMAL HIGH (ref 65–99)
Glucose-Capillary: 95 mg/dL (ref 65–99)
Glucose-Capillary: 125 mg/dL — ABNORMAL HIGH (ref 65–99)

## 2016-06-04 MED ORDER — LEVALBUTEROL HCL 1.25 MG/0.5ML IN NEBU
1.2500 mg | INHALATION_SOLUTION | Freq: Four times a day (QID) | RESPIRATORY_TRACT | Status: DC | PRN
Start: 2016-06-04 — End: 2016-06-05

## 2016-06-04 MED ORDER — SPIRONOLACTONE 25 MG PO TABS
25.0000 mg | ORAL_TABLET | Freq: Every day | ORAL | Status: DC
  Administered 2016-06-04 – 2016-06-05 (×2): 25 mg via ORAL
  Filled 2016-06-04 (×2): qty 1

## 2016-06-04 MED ORDER — SACUBITRIL-VALSARTAN 24-26 MG PO TABS: 1.0000 | ORAL_TABLET | Freq: Two times a day (BID) | ORAL | Status: DC

## 2016-06-04 MED ORDER — IPRATROPIUM BROMIDE 0.02 % IN SOLN: 0.5000 mg | Freq: Four times a day (QID) | RESPIRATORY_TRACT | Status: DC | PRN

## 2016-06-04 MED ORDER — FUROSEMIDE 10 MG/ML IJ SOLN
20.0000 mg | Freq: Two times a day (BID) | INTRAMUSCULAR | Status: DC
  Administered 2016-06-04 – 2016-06-05 (×3): 20 mg via INTRAVENOUS
  Filled 2016-06-04 (×3): qty 2

## 2016-06-04 NOTE — [place] FL2 (Signed)
[place] MEDICAID FL2 LEVEL OF CARE SCREENING TOOL     IDENTIFICATION  Patient Name: [name] [name] Birthdate: 27-Aug-1945 Sex: female Admission Date (Current Location): 06/03/2016  [place] and [place] Number:  Chiropodist and Address:  [facility], [address], [place], [place] [address]      Provider Number: [id number]  Attending Physician Name and Address:  Shaune Pollack, MD  Relative Name and Phone Number:  Majel Homer   [phone] or Vivia Birmingham   [phone]      Current Level of Care: Hospital Recommended Level of Care: Skilled Nursing Facility Prior Approval Number:    Date Approved/Denied:   PASRR Number: [id number]  Discharge Plan: SNF    Current Diagnoses: Patient Active Problem List   Diagnosis Date Noted  . Acute on chronic congestive heart failure (HCC) 06/03/2016  . COPD exacerbation (HCC)   . Respiratory distress   . Wheezing     Orientation RESPIRATION BLADDER Height & Weight     Self, Time, Situation, Place  O2 (2L ) Continent Weight: 153 lb 3.2 oz (69.5 kg) Height:  5\' 2"  (157.5 cm)  BEHAVIORAL SYMPTOMS/MOOD NEUROLOGICAL BOWEL NUTRITION STATUS      Continent Diet (Carb Modified)  AMBULATORY STATUS COMMUNICATION OF NEEDS Skin   Limited Assist Verbally Normal                       Personal Care Assistance Level of Assistance              Functional Limitations Info  Sight, Hearing, Speech Sight Info: Adequate Hearing Info: Adequate Speech Info: Adequate    SPECIAL CARE FACTORS FREQUENCY  PT (By licensed PT)     PT Frequency: 5x a week              Contractures Contractures Info: Not present    Additional Factors Info  Code Status, Psychotropic, Allergies Code Status Info: Full Code Allergies Info: SULFA ANTIBIOTICS  Psychotropic Info: citalopram (CELEXA) tablet 20 mg         Current Medications (06/04/2016):  This is the current hospital active medication  list Current Facility-Administered Medications  Medication Dose Route Frequency Provider Last Rate Last Dose  . 0.9 %  sodium chloride infusion  250 mL Intravenous PRN Shaune Pollack, MD      . acetaminophen (TYLENOL) tablet 650 mg  650 mg Oral Q6H PRN Shaune Pollack, MD       Or  . acetaminophen (TYLENOL) suppository 650 mg  650 mg Rectal Q6H PRN Shaune Pollack, MD      . ALPRAZolam Prudy Feeler) tablet 0.25 mg  0.25 mg Oral TID PRN Shaune Pollack, MD   0.25 mg at 06/04/16 0905  . alum & mag hydroxide-simeth (MAALOX/MYLANTA) 200-200-20 MG/5ML suspension 5 mL  5 mL Oral Q8H PRN Shaune Pollack, MD   5 mL at 06/03/16 1802  . aspirin chewable tablet 81 mg  81 mg Oral Daily Shaune Pollack, MD   81 mg at 06/04/16 0905  . atorvastatin (LIPITOR) tablet 40 mg  40 mg Oral q1800 Shaune Pollack, MD   40 mg at 06/03/16 1736  . budesonide (PULMICORT) nebulizer solution 0.5 mg  0.5 mg Nebulization BID Vishal Mungal, MD   0.5 mg at 06/04/16 0739  . citalopram (CELEXA) tablet 20 mg  20 mg Oral Daily Shaune Pollack, MD   20 mg at 06/04/16 0905  . diphenhydrAMINE (BENADRYL) capsule 25 mg  25 mg Oral Q6H PRN  Shaune PollackQing Skylie Hiott, MD   25 mg at 06/04/16 0255  . enoxaparin (LOVENOX) injection 40 mg  40 mg Subcutaneous Q24H Shaune PollackQing Courvoisier Hamblen, MD   40 mg at 06/03/16 2007  . fluticasone (FLONASE) 50 MCG/ACT nasal spray 2 spray  2 spray Each Nare Daily Shaune PollackQing Lanier Millon, MD   2 spray at 06/04/16 0908  . [START ON 06/06/2016] fluticasone furoate-vilanterol (BREO ELLIPTA) 100-25 MCG/INH 1 puff  1 puff Inhalation Daily Shaune PollackQing Giacomo Valone, MD      . furosemide (LASIX) injection 20 mg  20 mg Intravenous Q12H Shaune PollackQing Sheehan Stacey, MD      . ipratropium (ATROVENT) nebulizer solution 0.5 mg  0.5 mg Nebulization Q6H PRN Shaune PollackQing Shamel Germond, MD      . levalbuterol Pauline Aus(XOPENEX) nebulizer solution 1.25 mg  1.25 mg Nebulization Q6H PRN Shaune PollackQing Tyquavious Gamel, MD      . MEDLINE mouth rinse  15 mL Mouth Rinse BID Shaune PollackQing Tresea Heine, MD   15 mL at 06/03/16 2200  . nebivolol (BYSTOLIC) tablet 5 mg  5 mg Oral Daily Shaune PollackQing Sora Olivo, MD   5 mg at 06/03/16 1157  .  ondansetron (ZOFRAN) tablet 4 mg  4 mg Oral Q6H PRN Shaune PollackQing Kalicia Dufresne, MD       Or  . ondansetron [facility](ZOFRAN) injection 4 mg  4 mg Intravenous Q6H PRN Shaune PollackQing Myrah Strawderman, MD      . oxyCODONE-acetaminophen (PERCOCET/ROXICET) 5-325 MG per tablet 1 tablet  1 tablet Oral Q8H PRN Shaune PollackQing Esau Fridman, MD   1 tablet at 06/04/16 0255   And  . oxyCODONE (Oxy IR/ROXICODONE) immediate release tablet 5 mg  5 mg Oral Q8H PRN Shaune PollackQing Pernell Lenoir, MD   5 mg at 06/03/16 2007  . pantoprazole (PROTONIX) EC tablet 40 mg  40 mg Oral Daily Shaune PollackQing Simya Tercero, MD   40 mg at 06/04/16 0905  . predniSONE (DELTASONE) tablet 20 mg  20 mg Oral Q breakfast Shaune PollackQing Frederico Gerling, MD   20 mg at 06/04/16 0856  . [START ON 06/05/2016] sacubitril-valsartan (ENTRESTO) 24-26 mg per tablet  1 tablet Oral BID Berton BonJanine Hammond, NP      . sodium chloride flush (NS) 0.9 % injection 3 mL  3 mL Intravenous Q12H Shaune PollackQing Bernice Mullin, MD   3 mL at 06/04/16 0905  . sodium chloride flush (NS) 0.9 % injection 3 mL  3 mL Intravenous Q12H Shaune PollackQing Jakeia Carreras, MD   3 mL at 06/03/16 2008  . sodium chloride flush (NS) 0.9 % injection 3 mL  3 mL Intravenous PRN Shaune PollackQing Jayce Kainz, MD      . spironolactone (ALDACTONE) tablet 25 mg  25 mg Oral Daily Shaune PollackQing Chryl Holten, MD   25 mg at 06/04/16 0905  . [START ON 06/05/2016] tiotropium (SPIRIVA) inhalation capsule 18 mcg  18 mcg Inhalation Daily Shaune PollackQing Karianna Gusman, MD      . traMADol Janean Sark(ULTRAM) tablet 25 mg  25 mg Oral Q4H PRN Shaune PollackQing Bentleigh Waren, MD         Discharge Medications: Please see discharge summary for a list of discharge medications.  Relevant Imaging Results:  Relevant Lab Results:   Additional Information SSN [id number]  Darleene Cleavernterhaus, Eric R

## 2016-06-04 NOTE — Progress Notes (Signed)
[facility] [place] [facility]   PATIENT NAME: [name] [name]    MR#:  [id number]  DATE OF BIRTH:  [birth year]  SUBJECTIVE:  CHIEF COMPLAINT:   Chief Complaint  Patient presents with  . Shortness of Breath   Better shortness of breath or cough, on O2 [place] 2L. REVIEW OF SYSTEMS:  Review of Systems  Constitutional: Positive for malaise/fatigue. Negative for chills and fever.  HENT: Negative for congestion.   Eyes: Negative for blurred vision and double vision.  Respiratory: Positive for cough and shortness of breath. Negative for hemoptysis, sputum production, wheezing and stridor.   Cardiovascular: Negative for chest pain and leg swelling.  Gastrointestinal: Negative for abdominal pain, diarrhea, nausea and vomiting.  Genitourinary: Negative for dysuria and hematuria.  Musculoskeletal: Negative for joint pain.  Skin: Negative for itching and rash.  Neurological: Positive for weakness. Negative for dizziness, focal weakness and loss of consciousness.  Psychiatric/Behavioral: Negative for depression. The patient is not nervous/anxious.     DRUG ALLERGIES:   Allergies  Allergen Reactions  . Sulfa Antibiotics Itching   VITALS:  Blood pressure 124/69, pulse 96, temperature 98.1 F (36.7 C), temperature source Oral, resp. rate 17, height 5\' 2"  (1.575 m), weight 153 lb 3.2 oz (69.5 kg), SpO2 98 %. PHYSICAL EXAMINATION:  Physical Exam  Constitutional: She is oriented to person, place, and time and well-developed, well-nourished, and in no distress.  HENT:  Head: Normocephalic.  Mouth/Throat: Oropharynx is clear and moist.  Eyes: Conjunctivae and EOM are normal.  Neck: Normal range of motion. Neck supple. No JVD present. No tracheal deviation present.  Cardiovascular: Normal rate, regular rhythm and normal heart sounds.  Exam reveals no gallop.   No murmur heard. Pulmonary/Chest: Effort normal. No respiratory distress. She has no wheezes. She has rales.    Abdominal: Soft. Bowel sounds are normal. She exhibits no distension. There is no tenderness.  Musculoskeletal: Normal range of motion. She exhibits no edema or tenderness.  Neurological: She is alert and oriented to person, place, and time. No cranial nerve deficit.  Skin: No rash noted. No erythema.  Psychiatric: Affect normal.   LABORATORY PANEL:   CBC  Recent Labs Lab 06/04/16 0614  WBC 8.0  HGB 10.4*  HCT 31.7*  PLT 169   ------------------------------------------------------------------------------------------------------------------ Chemistries   Recent Labs Lab 06/03/16 0625 06/03/16 1102 06/04/16 0614  NA 141  --  141  K 3.8  --  4.1  CL 103  --  99*  CO2 23  --  33*  GLUCOSE 159*  --  122*  BUN 15  --  19  CREATININE 0.90  --  0.97  CALCIUM 8.6*  --  8.2*  MG  --  1.8  --   AST 36  --   --   ALT 22  --   --   ALKPHOS 75  --   --   BILITOT 0.9  --   --    RADIOLOGY:  No results found. ASSESSMENT AND PLAN:   Acute respiratory failure with hypoxemia. Try to wean off oxygen nasal cannula, NEB and home nebulizer.  Acute on chronic systolic CHF. Decreased Lasix to 20 mg IV twice a day due to low side blood pressure. CHF protocol, echocardiogram: EF 20-25%,  change lisinopril to Entresto (36 hrs after last dose of lisinopril) and add spironolactone per cardiology consult.  Lactic acidosis. Improving. Leukocytosis, possible due to reaction. Improved. COPD. Continue home nebulizer. Stable.  Hypertension, continue patient home  hypertension medication. Anxiety. The next when necessary.  All the records are reviewed and case discussed with Care Management/Social Worker. Management plans discussed with the patient,Her son and they are in agreement.  CODE STATUS: Full code  TOTAL TIME TAKING CARE OF THIS PATIENT: 37 minutes.   More than 50% of the time was spent in counseling/coordination of care: YES  POSSIBLE D/C IN 2 DAYS, DEPENDING ON CLINICAL  CONDITION.   Shaune Pollackhen, Jerlene Rockers M.D on 06/04/2016 at 1:35 PM  Between 7am to 6pm - Pager - [phone]  After 6pm go to [email or web address] - Scientist, research (life sciences)password [email or web address]  [facility] [place] [facility]  Office  [phone]  CC: Primary care physician; No PCP Per Patient  Note: This dictation was prepared with Dragon dictation along with smaller phrase technology. Any transcriptional errors that result from this process are unintentional.

## 2016-06-04 NOTE — Consult Note (Addendum)
[name] [name] is a 70 y.o. female  [id number]  Primary Cardiologist: Adrian BlackwaterShaukat Khan Reason for Consultation: CHF, atrial fibrilaltion  HPI: [name] [name] presented with shortness of breath. She has a history of CHF, HTN, COPD, long time smoker and lung nodule. She reports that she has never had an MI or cardiac surgery or stent, however she has been hospitalized twice for severe cardiac events leading to intubation in 1996 and [facility] Unable to see records at this time. She was subsequently discharged to [facility] and states that they stopped giving Lasix and breathing treatments. She has occasional episodes of palpitations associated with shortness of breath and lightheadedness. She was put on BiPAP at first, and is breathing comfortably now on nasal cannula.  BNP on admission was 4453, serial troponins have been 0.02, 0.03, 0.04. Chest xray showed pulmonary edema.    Review of Systems: Positive for shortness of breath with exertion, occasional palpitations, orthopnea. Negative for chest pain/tightness/pressure, edema  Past Medical History:  Diagnosis Date  . CHF (congestive heart failure) (HCC)   . COPD (chronic obstructive pulmonary disease) (HCC)   . Hypertension   . Lung nodule     Medications Prior to Admission  Medication Sig Dispense Refill  . albuterol (PROVENTIL HFA;VENTOLIN HFA) 108 (90 Base) MCG/ACT inhaler Inhale 1 puff into the lungs every 6 (six) hours as needed for wheezing or shortness of breath.    Marland Kitchen. alum & mag hydroxide-simeth (MAALOX/MYLANTA) 200-200-20 MG/5ML suspension Take 5 mLs by mouth every 8 (eight) hours as needed for indigestion or heartburn.    Marland Kitchen. aspirin 81 MG chewable tablet Chew 81 mg by mouth daily.    Marland Kitchen. atorvastatin (LIPITOR) 40 MG tablet Take 40 mg by mouth daily at 6 PM.    . citalopram (CELEXA) 20 MG tablet Take 20 mg by mouth daily.    . diphenhydrAMINE (BENADRYL) 25 mg capsule Take 25 mg by mouth every 6 (six) hours as  needed.    . fluticasone (FLONASE) 50 MCG/ACT nasal spray Place 2 sprays into both nostrils daily.    . fluticasone furoate-vilanterol (BREO ELLIPTA) 100-25 MCG/INH AEPB Inhale 1 puff into the lungs daily.    . furosemide (LASIX) 20 MG tablet Take 20 mg by mouth daily.    Marland Kitchen. lisinopril (PRINIVIL,ZESTRIL) 10 MG tablet Take 10 mg by mouth daily.    . nebivolol (BYSTOLIC) 5 MG tablet Take 5 mg by mouth daily.    Marland Kitchen. omeprazole (PRILOSEC) 20 MG capsule Take 20 mg by mouth daily.    Marland Kitchen. oxyCODONE-acetaminophen (PERCOCET) 10-325 MG tablet Take 1 tablet by mouth every 8 (eight) hours as needed for pain.    . predniSONE (DELTASONE) 20 MG tablet Take 20 mg by mouth daily with breakfast.    . tiotropium (SPIRIVA) 18 MCG inhalation capsule Place 18 mcg into inhaler and inhale daily.    . traMADol (ULTRAM) 50 MG tablet Take 25 mg by mouth every 4 (four) hours as needed.       Marland Kitchen. aspirin  81 mg Oral Daily  . atorvastatin  40 mg Oral q1800  . budesonide (PULMICORT) nebulizer solution  0.5 mg Nebulization BID  . citalopram  20 mg Oral Daily  . enoxaparin (LOVENOX) injection  40 mg Subcutaneous Q24H  . fluticasone  2 spray Each Nare Daily  . [START ON 06/06/2016] fluticasone furoate-vilanterol  1 puff Inhalation Daily  . furosemide  40 mg Intravenous Q12H  . lisinopril  5 mg Oral  Daily  . mouth rinse  15 mL Mouth Rinse BID  . nebivolol  5 mg Oral Daily  . pantoprazole  40 mg Oral Daily  . predniSONE  20 mg Oral Q breakfast  . sodium chloride flush  3 mL Intravenous Q12H  . sodium chloride flush  3 mL Intravenous Q12H  . [START ON 06/05/2016] tiotropium  18 mcg Inhalation Daily    Infusions:   Allergies  Allergen Reactions  . Sulfa Antibiotics Itching    Social History   Social History  . Marital status: Widowed    Spouse name: N/A  . Number of children: N/A  . Years of education: N/A   Occupational History  . Not on file.   Social History Main Topics  . Smoking status: Former Games developer  .  Smokeless tobacco: Never Used  . Alcohol use No  . Drug use: Unknown  . Sexual activity: Not on file   Other Topics Concern  . Not on file   Social History Narrative  . No narrative on file    History reviewed. No pertinent family history.  PHYSICAL EXAM: Vitals:   06/03/16 2021 06/04/16 0419  BP: (!) 124/93 (!) 124/55  Pulse: 90 96  Resp: 16 16  Temp: 97.5 F (36.4 C) 98.2 F (36.8 C)     Intake/Output Summary (Last 24 hours) at 06/04/16 0816 Last data filed at 06/04/16 0425  Gross per 24 hour  Intake                0 ml  Output             3050 ml  Net            -3050 ml    General:  Well appearing. No respiratory difficulty HEENT: normal Neck: supple. no JVD. Carotids 2+ bilat; no bruits. No lymphadenopathy or thryomegaly appreciated. Cor: PMI nondisplaced. Irregular. No rubs, gallops or murmurs. Lungs: clear Abdomen: soft, nontender, nondistended. No hepatosplenomegaly. No bruits or masses. Good bowel sounds. Extremities: no cyanosis, clubbing, rash, edema Neuro: alert & oriented x 3, cranial nerves grossly intact. moves all 4 extremities w/o difficulty. Affect pleasant.  ECG: Sinus rhythm with frequent abberant atrial beats and frequent PVC's.  Results for orders placed or performed during the hospital encounter of 06/03/16 (from the past 24 hour(s))  Glucose, capillary     Status: Abnormal   Collection Time: 06/03/16 10:42 AM  Result Value Ref Range   Glucose-Capillary 128 (H) 65 - 99 mg/dL  Urinalysis complete, with microscopic ([facility] only)     Status: Abnormal   Collection Time: 06/03/16 10:50 AM  Result Value Ref Range   Color, Urine COLORLESS (A) YELLOW   APPearance CLEAR (A) CLEAR   Glucose, UA NEGATIVE NEGATIVE mg/dL   Bilirubin Urine NEGATIVE NEGATIVE   Ketones, ur NEGATIVE NEGATIVE mg/dL   Specific Gravity, Urine 1.004 (L) 1.005 - 1.030   Hgb urine dipstick NEGATIVE NEGATIVE   pH 6.0 5.0 - 8.0   Protein, ur NEGATIVE NEGATIVE mg/dL   Nitrite  NEGATIVE NEGATIVE   Leukocytes, UA NEGATIVE NEGATIVE   RBC / HPF 0-5 0 - 5 RBC/hpf   WBC, UA 0-5 0 - 5 WBC/hpf   Bacteria, UA NONE SEEN NONE SEEN   Squamous Epithelial / LPF 0-5 (A) NONE SEEN   Mucous PRESENT   Lactic acid, plasma     Status: Abnormal   Collection Time: 06/03/16 11:02 AM  Result Value Ref Range   Lactic Acid, Venous  2.7 (HH) 0.5 - 1.9 mmol/L  Magnesium     Status: None   Collection Time: 06/03/16 11:02 AM  Result Value Ref Range   Magnesium 1.8 1.7 - 2.4 mg/dL  MRSA PCR Screening     Status: None   Collection Time: 06/03/16 11:53 AM  Result Value Ref Range   MRSA by PCR NEGATIVE NEGATIVE  Glucose, capillary     Status: Abnormal   Collection Time: 06/03/16 12:23 PM  Result Value Ref Range   Glucose-Capillary 141 (H) 65 - 99 mg/dL   Comment 1 Notify RN   Glucose, capillary     Status: Abnormal   Collection Time: 06/03/16  4:29 PM  Result Value Ref Range   Glucose-Capillary 158 (H) 65 - 99 mg/dL   Comment 1 Notify RN   Glucose, capillary     Status: Abnormal   Collection Time: 06/03/16  8:23 PM  Result Value Ref Range   Glucose-Capillary 178 (H) 65 - 99 mg/dL  Basic metabolic panel     Status: Abnormal   Collection Time: 06/04/16  6:14 AM  Result Value Ref Range   Sodium 141 135 - 145 mmol/L   Potassium 4.1 3.5 - 5.1 mmol/L   Chloride 99 (L) 101 - 111 mmol/L   CO2 33 (H) 22 - 32 mmol/L   Glucose, Bld 122 (H) 65 - 99 mg/dL   BUN 19 6 - 20 mg/dL   Creatinine, Ser 1.61 0.44 - 1.00 mg/dL   Calcium 8.2 (L) 8.9 - 10.3 mg/dL   GFR calc non Af Amer 57 (L) >60 mL/min   GFR calc Af Amer >60 >60 mL/min   Anion gap 9 5 - 15  CBC     Status: Abnormal   Collection Time: 06/04/16  6:14 AM  Result Value Ref Range   WBC 8.0 3.6 - 11.0 K/uL   RBC 3.76 (L) 3.80 - 5.20 MIL/uL   Hemoglobin 10.4 (L) 12.0 - 16.0 g/dL   HCT 09.6 (L) 04.5 - 40.9 %   MCV 84.4 80.0 - 100.0 fL   MCH 27.7 26.0 - 34.0 pg   MCHC 32.8 32.0 - 36.0 g/dL   RDW 81.1 (H) 91.4 - 78.2 %   Platelets  169 150 - 440 K/uL  Glucose, capillary     Status: Abnormal   Collection Time: 06/04/16  7:48 AM  Result Value Ref Range   Glucose-Capillary 100 (H) 65 - 99 mg/dL   Dg Chest Port 1 View  Result Date: 06/03/2016 CLINICAL DATA:  70 year old with respiratory distress. EXAM: PORTABLE CHEST 1 VIEW COMPARISON:  11/27/2014 FINDINGS: There is diffuse interstitial thickening throughout both lungs. No focal areas of consolidation. Heart size is mildly enlarged but stable. Atherosclerotic calcifications at the aortic arch. Surgical plate in the right clavicle. Negative for a pneumothorax. Trachea is midline. There is stable scarring at the left lung apex. IMPRESSION: Diffuse enlargement of interstitial markings in both lungs. Findings are most suggestive for interstitial pulmonary edema. Atypical pneumonia would be in the differential. Electronically Signed   By: Richarda Overlie M.D.   On: 06/03/2016 07:21     ASSESSMENT AND PLAN: CHF with shortness of breath and orthopnea. EF 25-30% with diffuse hypokineses and severely dilated LA. Breathing is improving on IV lasix. Will change lisinopril to Entresto (36 hrs after last dose of lisinopril) and add spironolactone. Cardiac rhythm does not appear to be frank atrial fibrillation and rate is controlled in the 80's and 90's. Will continue to monitor.  Patient should follow up in our office for aggressive heart failure treatment.  Patient has been examined by Dr Welton Flakes. Care has been discussed with Dr Welton Flakes.  Berton Bon, NP 06/04/2016 8:16 AM

## 2016-06-04 NOTE — Progress Notes (Signed)
Pt. Slept well throughout the night with c/o pain x2, medicated with effective results. No c/o SOB or acute distress noted.

## 2016-06-04 NOTE — Consult Note (Signed)
PULMONARY / CRITICAL CARE MEDICINE   Name: [name] [name] MRN: [id number] DOB: [birth year]    ADMISSION DATE:  06/03/2016 CONSULTATION DATE:  06/03/16  REFERRING MD :  Dr. Alphonzo Lemmings   CHIEF COMPLAINT:     Short of breath   HISTORY OF PRESENT ILLNESS    70 yo Female past medical history of COPD, tobacco abuse, suspected CHF, now admitted with acute respiratory distress requiring intermittent BiPAP. Patient is a poor historian, there is no family at bedside at this time. Per patient, she was at [facility] about 2 weeks ago after suffering a possible cardiac versus respiratory arrest, in any event she was intubated for a number of days, discharge and transfer to a rehabilitation facility. At the rehabilitation facility she states that she has not gotten her Lasix in about 4-5 days, and nebulizer treatments haven't been given like they should. This morning at about 4-5 AM, she had intense shortness of breath and was transferred to the ER. Chest x-ray shows bilateral interstitial edema along with a BNP greater than 4000 consistent with congestive heart failure. At the time of my arrival in the ER, she was noted to be on BiPAP saturating 100%, she has been on BiPAP 1.5 hours, she was taken off a place on 2 L and kept O2 sats greater than 88% at time. She states that her left ear is also been hurting for the past 2-3 days.   SUBJECTIVE: Patient doing well this morning, no significant wheezing on exam noted, states that her breathing is back to baseline, she is no longer feeling short of breath, she does have a mild cough with mild white sputum production however this is somewhat chronic for her she states.  SIGNIFICANT EVENTS   11/8>Shortness of breath at the rehabilitation facility, placed on intermittent BiPAP, transition to 2 L nasal cannula saturating well now. BNP greater than 4000, chest x-ray bilateral interstitial edema and vascular engorgement   PAST MEDICAL HISTORY    :  Past  Medical History:  Diagnosis Date  . CHF (congestive heart failure) (HCC)   . COPD (chronic obstructive pulmonary disease) (HCC)   . Hypertension   . Lung nodule    Past Surgical History:  Procedure Laterality Date  . REPLACEMENT TOTAL KNEE     Prior to Admission medications   Medication Sig Start Date End Date Taking? Authorizing Provider  albuterol (PROVENTIL HFA;VENTOLIN HFA) 108 (90 Base) MCG/ACT inhaler Inhale 1 puff into the lungs every 6 (six) hours as needed for wheezing or shortness of breath.   Yes Historical Provider, MD  alum & mag hydroxide-simeth (MAALOX/MYLANTA) 200-200-20 MG/5ML suspension Take 5 mLs by mouth every 8 (eight) hours as needed for indigestion or heartburn.   Yes Historical Provider, MD  aspirin 81 MG chewable tablet Chew 81 mg by mouth daily.   Yes Historical Provider, MD  atorvastatin (LIPITOR) 40 MG tablet Take 40 mg by mouth daily at 6 PM.   Yes Historical Provider, MD  citalopram (CELEXA) 20 MG tablet Take 20 mg by mouth daily.   Yes Historical Provider, MD  diphenhydrAMINE (BENADRYL) 25 mg capsule Take 25 mg by mouth every 6 (six) hours as needed.   Yes Historical Provider, MD  fluticasone (FLONASE) 50 MCG/ACT nasal spray Place 2 sprays into both nostrils daily.   Yes Historical Provider, MD  fluticasone furoate-vilanterol (BREO ELLIPTA) 100-25 MCG/INH AEPB Inhale 1 puff into the lungs daily.   Yes Historical Provider, MD  furosemide (LASIX) 20 MG tablet  Take 20 mg by mouth daily.   Yes Historical Provider, MD  lisinopril (PRINIVIL,ZESTRIL) 10 MG tablet Take 10 mg by mouth daily.   Yes Historical Provider, MD  nebivolol (BYSTOLIC) 5 MG tablet Take 5 mg by mouth daily.   Yes Historical Provider, MD  omeprazole (PRILOSEC) 20 MG capsule Take 20 mg by mouth daily.   Yes Historical Provider, MD  oxyCODONE-acetaminophen (PERCOCET) 10-325 MG tablet Take 1 tablet by mouth every 8 (eight) hours as needed for pain.   Yes Historical Provider, MD  predniSONE  (DELTASONE) 20 MG tablet Take 20 mg by mouth daily with breakfast.   Yes Historical Provider, MD  tiotropium (SPIRIVA) 18 MCG inhalation capsule Place 18 mcg into inhaler and inhale daily.   Yes Historical Provider, MD  traMADol (ULTRAM) 50 MG tablet Take 25 mg by mouth every 4 (four) hours as needed.   Yes Historical Provider, MD   Allergies  Allergen Reactions  . Sulfa Antibiotics Itching     FAMILY HISTORY   History reviewed. No pertinent family history.    SOCIAL HISTORY    reports that she has quit smoking. She has never used smokeless tobacco. She reports that she does not drink alcohol. Her drug history is not on file.  Review of Systems  Constitutional: Negative for chills and fever.  HENT: Negative for ear pain.   Eyes: Negative for blurred vision, double vision and photophobia.  Respiratory: Positive for cough and sputum production. Negative for shortness of breath and wheezing.        Mild cough with mild clear to white sputum production  Cardiovascular: Negative for chest pain.  Gastrointestinal: Negative for abdominal pain, diarrhea, heartburn, nausea and vomiting.  Genitourinary: Negative for dysuria.  Musculoskeletal: Negative for myalgias.  Skin: Negative for rash.  Neurological: Negative for dizziness.  Endo/Heme/Allergies: Does not bruise/bleed easily.  Psychiatric/Behavioral: Negative for depression.      VITAL SIGNS    Temp:  [97.5 F (36.4 C)-98.2 F (36.8 C)] 98.2 F (36.8 C) (11/09 0419) Pulse Rate:  [74-97] 96 (11/09 0419) Resp:  [16-20] 16 (11/09 0419) BP: (102-146)/(53-93) 124/55 (11/09 0419) SpO2:  [90 %-98 %] 95 % (11/09 0739) FiO2 (%):  [28 %] 28 % (11/09 0739) Weight:  [153 lb 3.2 oz (69.5 kg)] 153 lb 3.2 oz (69.5 kg) (11/09 0419) HEMODYNAMICS:   VENTILATOR SETTINGS: FiO2 (%):  [28 %] 28 % INTAKE / OUTPUT:  Intake/Output Summary (Last 24 hours) at 06/04/16 0809 Last data filed at 06/04/16 0425  Gross per 24 hour  Intake                 0 ml  Output             3050 ml  Net            -3050 ml       PHYSICAL EXAM   Physical Exam  Constitutional: She is oriented to person, place, and time. She appears well-developed and well-nourished. No distress.  HENT:  Head: Normocephalic and atraumatic.  Right Ear: External ear normal.  Left Ear: External ear normal.  Eyes: Conjunctivae and EOM are normal. Pupils are equal, round, and reactive to light.  Neck: Normal range of motion. Neck supple. No thyromegaly present.  Cardiovascular: Normal rate, regular rhythm and intact distal pulses.  Exam reveals no friction rub.   No murmur heard. Pulmonary/Chest: No respiratory distress. She has no wheezes. She has no rales.  Anterior chest wheezing. Decreased basilar breath  improve air movement today, no wheezing, very fine basilar crackles noted which is an improvement  Abdominal: Soft. Bowel sounds are normal. She exhibits no distension. There is no tenderness.  Musculoskeletal: Normal range of motion. She exhibits no edema.  Neurological: She is alert and oriented to person, place, and time.  Skin: Skin is warm and dry.  Nursing note and vitals reviewed.      LABS   LABS:  CBC  Recent Labs Lab 06/03/16 0625 06/04/16 0614  WBC 16.5* 8.0  HGB 13.0 10.4*  HCT 39.7 31.7*  PLT 251 169   Coag's No results for input(s): APTT, INR in the last 168 hours. BMET  Recent Labs Lab 06/03/16 0625 06/04/16 0614  NA 141 141  K 3.8 4.1  CL 103 99*  CO2 23 33*  BUN 15 19  CREATININE 0.90 0.97  GLUCOSE 159* 122*   Electrolytes  Recent Labs Lab 06/03/16 0625 06/03/16 1102 06/04/16 0614  CALCIUM 8.6*  --  8.2*  MG  --  1.8  --    Sepsis Markers  Recent Labs Lab 06/03/16 0643 06/03/16 1102  LATICACIDVEN 4.4* 2.7*   ABG No results for input(s): PHART, PCO2ART, PO2ART in the last 168 hours. Liver Enzymes  Recent Labs Lab 06/03/16 0625  AST 36  ALT 22  ALKPHOS 75  BILITOT 0.9  ALBUMIN 3.7    Cardiac Enzymes  Recent Labs Lab 06/03/16 0625  TROPONINI 0.04*   Glucose  Recent Labs Lab 06/03/16 1042 06/03/16 1223 06/03/16 1629 06/03/16 2023 06/04/16 0748  GLUCAP 128* 141* 158* 178* 100*     Recent Results (from the past 240 hour(s))  MRSA PCR Screening     Status: None   Collection Time: 06/03/16 11:53 AM  Result Value Ref Range Status   MRSA by PCR NEGATIVE NEGATIVE Final    Comment:        The GeneXpert MRSA Assay (FDA approved for NASAL specimens only), is one component of a comprehensive MRSA colonization surveillance program. It is not intended to diagnose MRSA infection nor to guide or monitor treatment for MRSA infections.      Current Facility-Administered Medications:  .  0.9 %  sodium chloride infusion, 250 mL, Intravenous, PRN, Shaune PollackQing Chen, MD .  acetaminophen (TYLENOL) tablet 650 mg, 650 mg, Oral, Q6H PRN **OR** acetaminophen (TYLENOL) suppository 650 mg, 650 mg, Rectal, Q6H PRN, Shaune PollackQing Chen, MD .  ALPRAZolam Prudy Feeler(XANAX) tablet 0.25 mg, 0.25 mg, Oral, TID PRN, Shaune PollackQing Chen, MD, 0.25 mg at 06/03/16 1844 .  alum & mag hydroxide-simeth (MAALOX/MYLANTA) 200-200-20 MG/5ML suspension 5 mL, 5 mL, Oral, Q8H PRN, Shaune PollackQing Chen, MD, 5 mL at 06/03/16 1802 .  aspirin chewable tablet 81 mg, 81 mg, Oral, Daily, Shaune PollackQing Chen, MD, 81 mg at 06/03/16 1153 .  atorvastatin (LIPITOR) tablet 40 mg, 40 mg, Oral, q1800, Shaune PollackQing Chen, MD, 40 mg at 06/03/16 1736 .  budesonide (PULMICORT) nebulizer solution 0.5 mg, 0.5 mg, Nebulization, BID, Anees Vanecek, MD, 0.5 mg at 06/04/16 0739 .  citalopram (CELEXA) tablet 20 mg, 20 mg, Oral, Daily, Shaune PollackQing Chen, MD, 20 mg at 06/03/16 1153 .  diphenhydrAMINE (BENADRYL) capsule 25 mg, 25 mg, Oral, Q6H PRN, Shaune PollackQing Chen, MD, 25 mg at 06/04/16 0255 .  enoxaparin (LOVENOX) injection 40 mg, 40 mg, Subcutaneous, Q24H, Shaune PollackQing Chen, MD, 40 mg at 06/03/16 2007 .  fluticasone (FLONASE) 50 MCG/ACT nasal spray 2 spray, 2 spray, Each Nare, Daily, Shaune PollackQing Chen, MD, 2 spray  at 06/03/16 1154 .  [START  ON 06/06/2016] fluticasone furoate-vilanterol (BREO ELLIPTA) 100-25 MCG/INH 1 puff, 1 puff, Inhalation, Daily, Shaune Pollack, MD .  furosemide (LASIX) injection 40 mg, 40 mg, Intravenous, Q12H, Shaune Pollack, MD, 40 mg at 06/03/16 2006 .  ipratropium (ATROVENT) nebulizer solution 0.5 mg, 0.5 mg, Nebulization, Q6H PRN, Shaune Pollack, MD .  levalbuterol [facility]) nebulizer solution 1.25 mg, 1.25 mg, Nebulization, Q6H PRN, Shaune Pollack, MD .  lisinopril (PRINIVIL,ZESTRIL) tablet 5 mg, 5 mg, Oral, Daily, Shaune Pollack, MD, 5 mg at 06/03/16 1153 .  MEDLINE mouth rinse, 15 mL, Mouth Rinse, BID, Shaune Pollack, MD, 15 mL at 06/03/16 2200 .  nebivolol (BYSTOLIC) tablet 5 mg, 5 mg, Oral, Daily, Shaune Pollack, MD, 5 mg at 06/03/16 1157 .  ondansetron (ZOFRAN) tablet 4 mg, 4 mg, Oral, Q6H PRN **OR** ondansetron (ZOFRAN) injection 4 mg, 4 mg, Intravenous, Q6H PRN, Shaune Pollack, MD .  oxyCODONE-acetaminophen (PERCOCET/ROXICET) 5-325 MG per tablet 1 tablet, 1 tablet, Oral, Q8H PRN, 1 tablet at 06/04/16 0255 **AND** oxyCODONE (Oxy IR/ROXICODONE) immediate release tablet 5 mg, 5 mg, Oral, Q8H PRN, Shaune Pollack, MD, 5 mg at 06/03/16 2007 .  pantoprazole (PROTONIX) EC tablet 40 mg, 40 mg, Oral, Daily, Shaune Pollack, MD, 40 mg at 06/03/16 1155 .  predniSONE (DELTASONE) tablet 20 mg, 20 mg, Oral, Q breakfast, Shaune Pollack, MD, 20 mg at 06/03/16 1153 .  sodium chloride flush (NS) 0.9 % injection 3 mL, 3 mL, Intravenous, Q12H, Shaune Pollack, MD .  sodium chloride flush (NS) 0.9 % injection 3 mL, 3 mL, Intravenous, Q12H, Shaune Pollack, MD, 3 mL at 06/03/16 2008 .  sodium chloride flush (NS) 0.9 % injection 3 mL, 3 mL, Intravenous, PRN, Shaune Pollack, MD .  Melene Muller ON 06/05/2016] tiotropium [facility]) inhalation capsule 18 mcg, 18 mcg, Inhalation, Daily, Shaune Pollack, MD .  traMADol Janean Sark) tablet 25 mg, 25 mg, Oral, Q4H PRN, Shaune Pollack, MD  IMAGING    No results found.    Indwelling Urinary Catheter continued, requirement due to   Reason to  continue Indwelling Urinary Catheter for strict Intake/Output monitoring for hemodynamic instability   Central Line continued, requirement due to   Reason to continue [employer] Monitoring of central venous pressure or other hemodynamic parameters   Ventilator continued, requirement due to, resp failure    Ventilator Sedation RASS 0 to -2   Cultures: BCx2  UC  Sputum  Antibiotics:  Lines:   ASSESSMENT/PLAN  Discussion: 70 year old female, former smoker, history of COPD, CHF, now with acute CHF exacerbation requiring intermittent BiPAP initially, now switched to nasal cannula.  Assessment: Respiratory distress-resolving CHF exacerbation Wheezing COPD Tobacco abuse Leukocytosis-reactive>resolving  Plan:  -Patient with significant improvement today, she is back to her baseline breathing, I suspect the majority of her symptoms were mostly CHF related leading to respiratory distress and failure which is now resolved -Intermittent BiPAP as needed, currently not requiring BiPAP, does not wear oxygen at home, -Keep O2 saturations greater than 88%, supplemental oxygen as needed -Schedule DuoNeb's or Xopenex pending which one patient tolerates -May start by mouth steroid, would recommend low-dose taper given that this is more of a CHF exacerbation than COPD -Tobacco counseling done -She has quit smoking 2.5 months ago -I have reviewed her chest x-ray in detail, findings consistent with interstitial edema and bilateral vascular engorgement, mostly CHF, from 11/8. -Patient has all her follow-up with [facility] health care system, and prefers to keep her follow-up with them.  Thank you for consulting [facility], we will  signoff at this time.  Please feel free to contact us with any questions at [phone] (please enter 7-digits).    I have personally obtained a history, examined the patient, evaluated laboratory and imaging results, formulated the assessment  and plan and placed orders.  Pulmonary Care time - 35 min  Stephanie Acre, MD [facility] Pager [phone] (please enter 7-digits) On Call Pager - [phone] (please enter 7-digits)     06/04/2016, 8:09 AM  Note: This note was prepared with Dragon dictation along with smaller phrase technology. Any transcriptional errors that result from this process are unintentional.

## 2016-06-05 DIAGNOSIS — R072 Precordial pain: Secondary | ICD-10-CM | POA: Diagnosis not present

## 2016-06-05 DIAGNOSIS — Z23 Encounter for immunization: Secondary | ICD-10-CM | POA: Diagnosis not present

## 2016-06-05 DIAGNOSIS — I10 Essential (primary) hypertension: Secondary | ICD-10-CM | POA: Diagnosis not present

## 2016-06-05 DIAGNOSIS — J441 Chronic obstructive pulmonary disease with (acute) exacerbation: Secondary | ICD-10-CM | POA: Diagnosis not present

## 2016-06-05 DIAGNOSIS — I110 Hypertensive heart disease with heart failure: Secondary | ICD-10-CM | POA: Diagnosis not present

## 2016-06-05 DIAGNOSIS — R911 Solitary pulmonary nodule: Secondary | ICD-10-CM | POA: Diagnosis not present

## 2016-06-05 DIAGNOSIS — R9431 Abnormal electrocardiogram [ECG] [EKG]: Secondary | ICD-10-CM | POA: Diagnosis not present

## 2016-06-05 DIAGNOSIS — I4891 Unspecified atrial fibrillation: Secondary | ICD-10-CM | POA: Diagnosis not present

## 2016-06-05 DIAGNOSIS — I5023 Acute on chronic systolic (congestive) heart failure: Secondary | ICD-10-CM | POA: Diagnosis not present

## 2016-06-05 DIAGNOSIS — I482 Chronic atrial fibrillation: Secondary | ICD-10-CM | POA: Diagnosis not present

## 2016-06-05 DIAGNOSIS — R262 Difficulty in walking, not elsewhere classified: Secondary | ICD-10-CM | POA: Diagnosis not present

## 2016-06-05 DIAGNOSIS — I509 Heart failure, unspecified: Secondary | ICD-10-CM | POA: Diagnosis not present

## 2016-06-05 DIAGNOSIS — F339 Major depressive disorder, recurrent, unspecified: Secondary | ICD-10-CM | POA: Diagnosis not present

## 2016-06-05 DIAGNOSIS — Z9181 History of falling: Secondary | ICD-10-CM | POA: Diagnosis not present

## 2016-06-05 DIAGNOSIS — G931 Anoxic brain damage, not elsewhere classified: Secondary | ICD-10-CM | POA: Diagnosis not present

## 2016-06-05 DIAGNOSIS — I5022 Chronic systolic (congestive) heart failure: Secondary | ICD-10-CM | POA: Diagnosis not present

## 2016-06-05 DIAGNOSIS — Z7982 Long term (current) use of aspirin: Secondary | ICD-10-CM | POA: Diagnosis not present

## 2016-06-05 DIAGNOSIS — R0602 Shortness of breath: Secondary | ICD-10-CM | POA: Diagnosis not present

## 2016-06-05 DIAGNOSIS — S51811D Laceration without foreign body of right forearm, subsequent encounter: Secondary | ICD-10-CM | POA: Diagnosis not present

## 2016-06-05 DIAGNOSIS — Z8701 Personal history of pneumonia (recurrent): Secondary | ICD-10-CM | POA: Diagnosis not present

## 2016-06-05 DIAGNOSIS — F419 Anxiety disorder, unspecified: Secondary | ICD-10-CM | POA: Diagnosis not present

## 2016-06-05 DIAGNOSIS — Z6828 Body mass index (BMI) 28.0-28.9, adult: Secondary | ICD-10-CM | POA: Diagnosis not present

## 2016-06-05 DIAGNOSIS — Z7952 Long term (current) use of systemic steroids: Secondary | ICD-10-CM | POA: Diagnosis not present

## 2016-06-05 DIAGNOSIS — R0600 Dyspnea, unspecified: Secondary | ICD-10-CM | POA: Diagnosis not present

## 2016-06-05 DIAGNOSIS — R0789 Other chest pain: Secondary | ICD-10-CM | POA: Diagnosis not present

## 2016-06-05 DIAGNOSIS — G3184 Mild cognitive impairment, so stated: Secondary | ICD-10-CM | POA: Diagnosis not present

## 2016-06-05 DIAGNOSIS — F329 Major depressive disorder, single episode, unspecified: Secondary | ICD-10-CM | POA: Diagnosis not present

## 2016-06-05 DIAGNOSIS — R2689 Other abnormalities of gait and mobility: Secondary | ICD-10-CM | POA: Diagnosis not present

## 2016-06-05 DIAGNOSIS — I8390 Asymptomatic varicose veins of unspecified lower extremity: Secondary | ICD-10-CM | POA: Diagnosis not present

## 2016-06-05 DIAGNOSIS — J9612 Chronic respiratory failure with hypercapnia: Secondary | ICD-10-CM | POA: Diagnosis not present

## 2016-06-05 DIAGNOSIS — J9600 Acute respiratory failure, unspecified whether with hypoxia or hypercapnia: Secondary | ICD-10-CM | POA: Diagnosis not present

## 2016-06-05 DIAGNOSIS — J9611 Chronic respiratory failure with hypoxia: Secondary | ICD-10-CM | POA: Diagnosis not present

## 2016-06-05 DIAGNOSIS — J449 Chronic obstructive pulmonary disease, unspecified: Secondary | ICD-10-CM | POA: Diagnosis not present

## 2016-06-05 LAB — BASIC METABOLIC PANEL
Anion gap: 10 (ref 5–15)
BUN: 21 mg/dL — AB (ref 6–20)
CALCIUM: 7.8 mg/dL — AB (ref 8.9–10.3)
CO2: 29 mmol/L (ref 22–32)
CREATININE: 0.74 mg/dL (ref 0.44–1.00)
Chloride: 101 mmol/L (ref 101–111)
GFR calc non Af Amer: >60 mL/min (ref >60)
GLUCOSE: 90 mg/dL (ref 65–99)
Potassium: 3.5 mmol/L (ref 3.5–5.1)
Sodium: 140 mmol/L (ref 135–145)

## 2016-06-05 LAB — MAGNESIUM: Magnesium: 1.9 mg/dL (ref 1.7–2.4)

## 2016-06-05 MED ORDER — SPIRONOLACTONE 25 MG PO TABS: 25.0000 mg | ORAL_TABLET | Freq: Every day | ORAL | Status: DC

## 2016-06-05 MED ORDER — POTASSIUM CHLORIDE CRYS ER 20 MEQ PO TBCR
40.0000 meq | EXTENDED_RELEASE_TABLET | Freq: Once | ORAL | Status: AC
  Administered 2016-06-05: 40 meq via ORAL
  Filled 2016-06-05: qty 2

## 2016-06-05 MED ORDER — SACUBITRIL-VALSARTAN 24-26 MG PO TABS
1.0000 | ORAL_TABLET | Freq: Two times a day (BID) | ORAL | Status: DC
  Administered 2016-06-05: 1 via ORAL
  Filled 2016-06-05: qty 1

## 2016-06-05 MED ORDER — CARVEDILOL 6.25 MG PO TABS: 6.2500 mg | ORAL_TABLET | Freq: Two times a day (BID) | ORAL | Status: DC

## 2016-06-05 MED ORDER — CARVEDILOL 6.25 MG PO TABS
6.2500 mg | ORAL_TABLET | Freq: Two times a day (BID) | ORAL | Status: DC
  Administered 2016-06-05: 6.25 mg via ORAL
  Filled 2016-06-05: qty 1

## 2016-06-05 MED ORDER — BUDESONIDE 0.5 MG/2ML IN SUSP: 0.5000 mg | Freq: Two times a day (BID) | RESPIRATORY_TRACT | Status: DC | PRN

## 2016-06-05 MED ORDER — SACUBITRIL-VALSARTAN 24-26 MG PO TABS: 1.0000 | ORAL_TABLET | Freq: Two times a day (BID) | ORAL | Status: DC

## 2016-06-05 NOTE — Care Management (Signed)
CM received note that patient's son wished to speak with the care manager.  Returned the call and left a voicemail message.  Patient is for discharge to snf today

## 2016-06-05 NOTE — Care Management Important Message (Signed)
Important Message  Patient Details  Name: [name] [name] MRN: [id number] Date of Birth: September 14, 1945   Medicare Important Message Given:  N/A - LOS <3 / Initial given by admissions    Eber Hong, RN 06/05/2016, 9:42 AM

## 2016-06-05 NOTE — Progress Notes (Signed)
Patient is discharge in a stable condition with 02 to [facility] , report given to floor nurse Janeann Forehand , pick up by EMS

## 2016-06-05 NOTE — Discharge Instructions (Signed)
Heart Failure Clinic appointment on June 25, 2016 at 11:00am with Clarisa Kindred, FNP. Please call [phone] to reschedule.  Heart healthy diet.

## 2016-06-05 NOTE — Discharge Summary (Signed)
[facility] [place] [facility]   PATIENT NAME: [name] [name]    MR#:  [id number]  DATE OF BIRTH:  10-Feb-1946  DATE OF ADMISSION:  06/03/2016   ADMITTING PHYSICIAN: Shaune Pollack, MD  DATE OF DISCHARGE: 06/05/2016  PRIMARY CARE PHYSICIAN: No PCP Per Patient   ADMISSION DIAGNOSIS:  Respiratory distress [R06.00] COPD exacerbation (HCC) [J44.1] Acute on chronic congestive heart failure, unspecified congestive heart failure type (HCC) [I50.9] DISCHARGE DIAGNOSIS:  Active Problems:   Acute on chronic congestive heart failure (HCC) Acute respiratory failure with hypoxia. Acute on chronic systolic CHF. SECONDARY DIAGNOSIS:   Past Medical History:  Diagnosis Date  . CHF (congestive heart failure) (HCC)   . COPD (chronic obstructive pulmonary disease) (HCC)   . Hypertension   . Lung nodule    HOSPITAL COURSE:  Acute respiratory failure with hypoxia. Try to wean off oxygen nasal cannula, NEBand home nebulizer.  Acute on chronic systolic CHF. Decreased Lasix to 20 mg IV twice a day due to low side blood pressure. Change to home dose 20 mg daily. CHF protocol, echocardiogram: EF 20-25%, changed lisinopril to Entrestoand added spironolactone per cardiology consult.  Lactic acidosis. Improving. Leukocytosis, possible due to reaction. Improved. COPD. Continue home nebulizer. Stable.  Hypertension, continue patient hypertension medication. Anxiety. Xanax when necessary.  DISCHARGE CONDITIONS:  Stable,discharge to SNF today. CONSULTS OBTAINED:  Treatment Team:  Laddie Aquas, MD Laurier Nancy, MD DRUG ALLERGIES:   Allergies  Allergen Reactions  . Sulfa Antibiotics Itching   DISCHARGE MEDICATIONS:     Medication List    STOP taking these medications   BYSTOLIC 5 MG tablet Generic drug:  nebivolol   lisinopril 10 MG tablet Commonly known as:  PRINIVIL,ZESTRIL     TAKE these medications   albuterol 108 (90 Base) MCG/ACT inhaler Commonly known  as:  PROVENTIL HFA;VENTOLIN HFA Inhale 1 puff into the lungs every 6 (six) hours as needed for wheezing or shortness of breath.   alum & mag hydroxide-simeth 200-200-20 MG/5ML suspension Commonly known as:  MAALOX/MYLANTA Take 5 mLs by mouth every 8 (eight) hours as needed for indigestion or heartburn.   aspirin 81 MG chewable tablet Chew 81 mg by mouth daily.   atorvastatin 40 MG tablet Commonly known as:  LIPITOR Take 40 mg by mouth daily at 6 PM.   BREO ELLIPTA 100-25 MCG/INH Aepb Generic drug:  fluticasone furoate-vilanterol Inhale 1 puff into the lungs daily.   carvedilol 6.25 MG tablet Commonly known as:  COREG Take 1 tablet (6.25 mg total) by mouth 2 (two) times daily with a meal.   citalopram 20 MG tablet Commonly known as:  CELEXA Take 20 mg by mouth daily.   diphenhydrAMINE 25 mg capsule Commonly known as:  BENADRYL Take 25 mg by mouth every 6 (six) hours as needed.   fluticasone 50 MCG/ACT nasal spray Commonly known as:  FLONASE Place 2 sprays into both nostrils daily.   furosemide 20 MG tablet Commonly known as:  LASIX Take 20 mg by mouth daily.   omeprazole 20 MG capsule Commonly known as:  PRILOSEC Take 20 mg by mouth daily.   oxyCODONE-acetaminophen 10-325 MG tablet Commonly known as:  PERCOCET Take 1 tablet by mouth every 8 (eight) hours as needed for pain.   predniSONE 20 MG tablet Commonly known as:  DELTASONE Take 20 mg by mouth daily with breakfast.   sacubitril-valsartan 24-26 MG Commonly known as:  ENTRESTO Take 1 tablet by mouth 2 (two) times daily.  spironolactone 25 MG tablet Commonly known as:  ALDACTONE Take 1 tablet (25 mg total) by mouth daily.   tiotropium 18 MCG inhalation capsule Commonly known as:  SPIRIVA Place 18 mcg into inhaler and inhale daily.   traMADol 50 MG tablet Commonly known as:  ULTRAM Take 25 mg by mouth every 4 (four) hours as needed.        DISCHARGE INSTRUCTIONS:  See AVS. If you experience  worsening of your admission symptoms, develop shortness of breath, life threatening emergency, suicidal or homicidal thoughts you must seek medical attention immediately by calling 911 or calling your MD immediately  if symptoms less severe.  You Must read complete instructions/literature along with all the possible adverse reactions/side effects for all the Medicines you take and that have been prescribed to you. Take any new Medicines after you have completely understood and accpet all the possible adverse reactions/side effects.   Please note  You were cared for by a hospitalist during your hospital stay. If you have any questions about your discharge medications or the care you received while you were in the hospital after you are discharged, you can call the unit and asked to speak with the hospitalist on call if the hospitalist that took care of you is not available. Once you are discharged, your primary care physician will handle any further medical issues. Please note that NO REFILLS for any discharge medications will be authorized once you are discharged, as it is imperative that you return to your primary care physician (or establish a relationship with a primary care physician if you do not have one) for your aftercare needs so that they can reassess your need for medications and monitor your lab values.    On the day of Discharge:  VITAL SIGNS:  Blood pressure 125/66, pulse 87, temperature 98 F (36.7 C), temperature source Oral, resp. rate 18, height 5\' 2"  (1.575 m), weight 149 lb 6.4 oz (67.8 kg), SpO2 98 %. PHYSICAL EXAMINATION:  GENERAL:  70 y.o.-year-old patient lying in the bed with no acute distress.  EYES: Pupils equal, round, reactive to light and accommodation. No scleral icterus. Extraocular muscles intact.  HEENT: Head atraumatic, normocephalic. Oropharynx and nasopharynx clear.  NECK:  Supple, no jugular venous distention. No thyroid enlargement, no tenderness.  LUNGS:  Normal breath sounds bilaterally, no wheezing, rales,rhonchi or crepitation. No use of accessory muscles of respiration.  CARDIOVASCULAR: S1, S2 normal. No murmurs, rubs, or gallops.  ABDOMEN: Soft, non-tender, non-distended. Bowel sounds present. No organomegaly or mass.  EXTREMITIES: No pedal edema, cyanosis, or clubbing.  NEUROLOGIC: Cranial nerves II through XII are intact. Muscle strength 5/5 in all extremities. Sensation intact. Gait not checked.  PSYCHIATRIC: The patient is alert and oriented x 3.  SKIN: No obvious rash, lesion, or ulcer.  DATA REVIEW:   CBC  Recent Labs Lab 06/04/16 0614  WBC 8.0  HGB 10.4*  HCT 31.7*  PLT 169    Chemistries   Recent Labs Lab 06/03/16 0625  06/05/16 0305  NA 141  < > 140  K 3.8  < > 3.5  CL 103  < > 101  CO2 23  < > 29  GLUCOSE 159*  < > 90  BUN 15  < > 21*  CREATININE 0.90  < > 0.74  CALCIUM 8.6*  < > 7.8*  MG  --   < > 1.9  AST 36  --   --   ALT 22  --   --  ALKPHOS 75  --   --   BILITOT 0.9  --   --   < > = values in this interval not displayed.   Microbiology Results  Results for orders placed or performed during the hospital encounter of 06/03/16  MRSA PCR Screening     Status: None   Collection Time: 06/03/16 11:53 AM  Result Value Ref Range Status   MRSA by PCR NEGATIVE NEGATIVE Final    Comment:        The GeneXpert MRSA Assay (FDA approved for NASAL specimens only), is one component of a comprehensive MRSA colonization surveillance program. It is not intended to diagnose MRSA infection nor to guide or monitor treatment for MRSA infections.     RADIOLOGY:  No results found.   Management plans discussed with the patient, family and they are in agreement.  CODE STATUS:     Code Status Orders        Start     Ordered   06/03/16 1046  Full code  Continuous     06/03/16 1045    Code Status History    Date Active Date Inactive Code Status Order ID Comments User Context   This patient has a  current code status but no historical code status.      TOTAL TIME TAKING CARE OF THIS PATIENT: 36 minutes.    Shaune Pollackhen, [name] [name] M.D on 06/05/2016 at 9:25 AM  Between 7am to 6pm - Pager - [phone]  After 6pm go to [email or web address] - Scientist, research (life sciences)password [email or web address]  [facility]  Office  [phone]  CC: Primary care physician; No PCP Per Patient   Note: This dictation was prepared with Dragon dictation along with smaller phrase technology. Any transcriptional errors that result from this process are unintentional.

## 2016-06-05 NOTE — Clinical Social Work Note (Signed)
Clinical Social Work Assessment  Patient Details  Name: Belita Chronis MRN: [id number] Date of Birth: Mar 23, 1946  Date of referral:  06/04/16               Reason for consult:  Facility Placement                Permission sought to share information with:  Facility Medical sales representative, Family Supports Permission granted to share information::  Yes, Verbal Permission Granted  Name::     Majel Homer   [phone] or Vivia Birmingham   [phone]   Agency::  SNF admissions  Relationship::     Contact Information:     Housing/Transportation Living arrangements for the past 2 months:  Skilled Nursing Facility Source of Information:    Patient Interpreter Needed:  None Criminal Activity/Legal Involvement Pertinent to Current Situation/Hospitalization:  No - Comment as needed Significant Relationships:  Adult Children Lives with:  Facility Resident Do you feel safe going back to the place where you live?  Yes Need for family participation in patient care:  No (Coment)  Care giving concerns:  Patient had some issues regarding snf and does not want to return, but she is willing to go somewhere else for rehab.   Social Worker assessment / plan:  Patient is a 70 year old female who is alert and oriented x4 and able to express her feelings.  Patient was at a SNF for short term rehab, and would like to go to a different one.  MSW explained the process for looking for new placement, patient and her son gave MSW permission to fax out to other SNFs.  Patient was explained process for how insurance will pay for her stay, patient and son did not have any other questions.  Employment status:  Retired Health and safety inspector:  Medicare PT Recommendations:  Skilled Nursing Facility Information / Referral to community resources:  Skilled Nursing Facility  Patient/Family's Response to care:  Patient in agreement to going to a different SNF.  Patient/Family's Understanding of and  Emotional Response to Diagnosis, Current Treatment, and Prognosis:  Patient was not happy at the other SNF and expressed her concerns.  MSW explained there are other places available and she would like to go to a different facility.  Emotional Assessment Appearance:  Appears stated age Attitude/Demeanor/Rapport:    Affect (typically observed):  Appropriate, Blunt Orientation:  Oriented to Self, Oriented to Place, Oriented to  Time, Oriented to Situation Alcohol / Substance use:  Not Applicable Psych involvement (Current and /or in the community):  No (Comment)  Discharge Needs  Concerns to be addressed:  No discharge needs identified Readmission within the last 30 days:  No Current discharge risk:  Lack of support system Barriers to Discharge:  No Barriers Identified   Darleene Cleaver 06/05/2016, 10:01 AM

## 2016-06-05 NOTE — Clinical Social Work Note (Signed)
MSW spoke to patient and her son who expressed they would like to go to a different SNF.  MSW explained the process and faxed patient to other SNFs.  Patient was presented bed offers and she chose [facility] at [address], [place], [place], [address].  MSW spoke to facility and they can accept patient today.  MSW notified bedside nurse.  Patient to be d/c'ed today to [facility].  Patient and family agreeable to plans will transport via ems RN to call report to Rm [phone] 300 hall nurse.  Windell Moulding, MSW Mon-Fri 8a-4:30p [phone]   Ervin Knack. Reeve Turnley, MSW [phone]  Mon-Fri 8a-4:30p 06/05/2016 11:35 AM

## 2016-06-05 NOTE — Progress Notes (Addendum)
SUBJECTIVE: Patient is feeling better this morning. Has slept all night.   Vitals:   06/04/16 1149 06/04/16 1949 06/04/16 2020 06/05/16 0556  BP: 124/69 139/62  140/73  Pulse: 96 99  95  Resp: 17 18  18   Temp: 98.1 F (36.7 C) 98.3 F (36.8 C)  98 F (36.7 C)  TempSrc: Oral Oral  Oral  SpO2: 98% 98% 99% 99%  Weight:    149 lb 6.4 oz (67.8 kg)  Height:        Intake/Output Summary (Last 24 hours) at 06/05/16 0813 Last data filed at 06/04/16 2306  Gross per 24 hour  Intake              723 ml  Output             1500 ml  Net             -777 ml    LABS: Basic Metabolic Panel:  Recent Labs  16/10/96 1102 06/04/16 0614 06/05/16 0305  NA  --  141 140  K  --  4.1 3.5  CL  --  99* 101  CO2  --  33* 29  GLUCOSE  --  122* 90  BUN  --  19 21*  CREATININE  --  0.97 0.74  CALCIUM  --  8.2* 7.8*  MG 1.8  --   --    Liver Function Tests:  Recent Labs  06/03/16 0625  AST 36  ALT 22  ALKPHOS 75  BILITOT 0.9  PROT 6.6  ALBUMIN 3.7   No results for input(s): LIPASE, AMYLASE in the last 72 hours. CBC:  Recent Labs  06/03/16 0625 06/04/16 0614  WBC 16.5* 8.0  NEUTROABS 7.5*  --   HGB 13.0 10.4*  HCT 39.7 31.7*  MCV 86.6 84.4  PLT 251 169   Cardiac Enzymes:  Recent Labs  06/03/16 0625  TROPONINI 0.04*   BNP: Invalid input(s): POCBNP D-Dimer: No results for input(s): DDIMER in the last 72 hours. Hemoglobin A1C: No results for input(s): HGBA1C in the last 72 hours. Fasting Lipid Panel: No results for input(s): CHOL, HDL, LDLCALC, TRIG, CHOLHDL, LDLDIRECT in the last 72 hours. Thyroid Function Tests: No results for input(s): TSH, T4TOTAL, T3FREE, THYROIDAB in the last 72 hours.  Invalid input(s): FREET3 Anemia Panel: No results for input(s): VITAMINB12, FOLATE, FERRITIN, TIBC, IRON, RETICCTPCT in the last 72 hours.   PHYSICAL EXAM General: Well developed, well nourished, in no acute distress HEENT:  Normocephalic and atramatic Neck:  No JVD.   Lungs: Clear bilaterally to auscultation and percussion. Heart: HRRR . Normal S1 and S2 without gallops or murmurs.  Abdomen: Bowel sounds are positive, abdomen soft and non-tender  Msk:  Back normal, normal gait. Normal strength and tone for age. Extremities: No clubbing, cyanosis or edema.   Neuro: Alert and oriented X 3. Psych:  Good affect, responds appropriately  TELEMETRY: Sinus rhythm with frequent multifocal PVC's and PJCs. Rates in the 70's and 80's  ASSESSMENT AND PLAN: CHF with EF 25-30%. Breathing is improved with IV diuresis. Have added spironolactone and switched ACE-I to Entresto to better improve heart function. Pt is stable for discharge with close follow up. Appointment given for 06/09/2016 at 2:00 at [facility] for cardiology.   Advise to change bystolic to carvedilol 6.25 mg bid and give first dose of Entresto now.  Active Problems:   Acute on chronic congestive heart failure (HCC)    Berton Bon, NP 06/05/2016 8:13  AM

## 2016-06-09 DIAGNOSIS — R9431 Abnormal electrocardiogram [ECG] [EKG]: Secondary | ICD-10-CM | POA: Diagnosis not present

## 2016-06-09 DIAGNOSIS — I509 Heart failure, unspecified: Secondary | ICD-10-CM | POA: Diagnosis not present

## 2016-06-09 DIAGNOSIS — R911 Solitary pulmonary nodule: Secondary | ICD-10-CM | POA: Diagnosis not present

## 2016-06-09 DIAGNOSIS — R0602 Shortness of breath: Secondary | ICD-10-CM | POA: Diagnosis not present

## 2016-06-09 DIAGNOSIS — I10 Essential (primary) hypertension: Secondary | ICD-10-CM | POA: Diagnosis not present

## 2016-06-09 DIAGNOSIS — R072 Precordial pain: Secondary | ICD-10-CM | POA: Diagnosis not present

## 2016-06-09 DIAGNOSIS — J449 Chronic obstructive pulmonary disease, unspecified: Secondary | ICD-10-CM | POA: Diagnosis not present

## 2016-06-09 DIAGNOSIS — R0789 Other chest pain: Secondary | ICD-10-CM | POA: Diagnosis not present

## 2016-06-12 DIAGNOSIS — I509 Heart failure, unspecified: Secondary | ICD-10-CM | POA: Diagnosis not present

## 2016-06-12 DIAGNOSIS — R9431 Abnormal electrocardiogram [ECG] [EKG]: Secondary | ICD-10-CM | POA: Diagnosis not present

## 2016-06-12 DIAGNOSIS — R0602 Shortness of breath: Secondary | ICD-10-CM | POA: Diagnosis not present

## 2016-06-12 DIAGNOSIS — R072 Precordial pain: Secondary | ICD-10-CM | POA: Diagnosis not present

## 2016-06-12 DIAGNOSIS — R0789 Other chest pain: Secondary | ICD-10-CM | POA: Diagnosis not present

## 2016-06-12 DIAGNOSIS — R911 Solitary pulmonary nodule: Secondary | ICD-10-CM | POA: Diagnosis not present

## 2016-06-15 DIAGNOSIS — F32A Depression, unspecified: Secondary | ICD-10-CM | POA: Insufficient documentation

## 2016-06-15 DIAGNOSIS — F329 Major depressive disorder, single episode, unspecified: Secondary | ICD-10-CM | POA: Insufficient documentation

## 2016-06-15 DIAGNOSIS — I482 Chronic atrial fibrillation, unspecified: Secondary | ICD-10-CM | POA: Insufficient documentation

## 2016-06-16 DIAGNOSIS — I5022 Chronic systolic (congestive) heart failure: Secondary | ICD-10-CM | POA: Diagnosis not present

## 2016-06-16 DIAGNOSIS — Z6828 Body mass index (BMI) 28.0-28.9, adult: Secondary | ICD-10-CM | POA: Diagnosis not present

## 2016-06-16 DIAGNOSIS — F329 Major depressive disorder, single episode, unspecified: Secondary | ICD-10-CM | POA: Diagnosis not present

## 2016-06-16 DIAGNOSIS — I482 Chronic atrial fibrillation: Secondary | ICD-10-CM | POA: Diagnosis not present

## 2016-06-16 DIAGNOSIS — J449 Chronic obstructive pulmonary disease, unspecified: Secondary | ICD-10-CM | POA: Diagnosis not present

## 2016-06-16 DIAGNOSIS — G3184 Mild cognitive impairment, so stated: Secondary | ICD-10-CM | POA: Diagnosis not present

## 2016-06-16 DIAGNOSIS — Z23 Encounter for immunization: Secondary | ICD-10-CM | POA: Diagnosis not present

## 2016-06-16 DIAGNOSIS — G931 Anoxic brain damage, not elsewhere classified: Secondary | ICD-10-CM | POA: Diagnosis not present

## 2016-06-19 DIAGNOSIS — I5023 Acute on chronic systolic (congestive) heart failure: Secondary | ICD-10-CM | POA: Diagnosis not present

## 2016-06-19 DIAGNOSIS — J9612 Chronic respiratory failure with hypercapnia: Secondary | ICD-10-CM | POA: Diagnosis not present

## 2016-06-19 DIAGNOSIS — R079 Chest pain, unspecified: Secondary | ICD-10-CM | POA: Diagnosis not present

## 2016-06-19 DIAGNOSIS — I509 Heart failure, unspecified: Secondary | ICD-10-CM | POA: Diagnosis not present

## 2016-06-19 DIAGNOSIS — J441 Chronic obstructive pulmonary disease with (acute) exacerbation: Secondary | ICD-10-CM | POA: Diagnosis not present

## 2016-06-19 DIAGNOSIS — S51811D Laceration without foreign body of right forearm, subsequent encounter: Secondary | ICD-10-CM | POA: Diagnosis not present

## 2016-06-19 DIAGNOSIS — I420 Dilated cardiomyopathy: Secondary | ICD-10-CM | POA: Diagnosis not present

## 2016-06-19 DIAGNOSIS — J9611 Chronic respiratory failure with hypoxia: Secondary | ICD-10-CM | POA: Diagnosis not present

## 2016-06-19 DIAGNOSIS — R0602 Shortness of breath: Secondary | ICD-10-CM | POA: Diagnosis not present

## 2016-06-19 DIAGNOSIS — I110 Hypertensive heart disease with heart failure: Secondary | ICD-10-CM | POA: Diagnosis not present

## 2016-06-20 DIAGNOSIS — J9612 Chronic respiratory failure with hypercapnia: Secondary | ICD-10-CM | POA: Diagnosis not present

## 2016-06-20 DIAGNOSIS — S51811D Laceration without foreign body of right forearm, subsequent encounter: Secondary | ICD-10-CM | POA: Diagnosis not present

## 2016-06-20 DIAGNOSIS — I110 Hypertensive heart disease with heart failure: Secondary | ICD-10-CM | POA: Diagnosis not present

## 2016-06-20 DIAGNOSIS — I5023 Acute on chronic systolic (congestive) heart failure: Secondary | ICD-10-CM | POA: Diagnosis not present

## 2016-06-20 DIAGNOSIS — J9611 Chronic respiratory failure with hypoxia: Secondary | ICD-10-CM | POA: Diagnosis not present

## 2016-06-20 DIAGNOSIS — J441 Chronic obstructive pulmonary disease with (acute) exacerbation: Secondary | ICD-10-CM | POA: Diagnosis not present

## 2016-06-23 DIAGNOSIS — J9611 Chronic respiratory failure with hypoxia: Secondary | ICD-10-CM | POA: Diagnosis not present

## 2016-06-23 DIAGNOSIS — J441 Chronic obstructive pulmonary disease with (acute) exacerbation: Secondary | ICD-10-CM | POA: Diagnosis not present

## 2016-06-23 DIAGNOSIS — I5023 Acute on chronic systolic (congestive) heart failure: Secondary | ICD-10-CM | POA: Diagnosis not present

## 2016-06-23 DIAGNOSIS — S51811D Laceration without foreign body of right forearm, subsequent encounter: Secondary | ICD-10-CM | POA: Diagnosis not present

## 2016-06-23 DIAGNOSIS — I110 Hypertensive heart disease with heart failure: Secondary | ICD-10-CM | POA: Diagnosis not present

## 2016-06-23 DIAGNOSIS — J9612 Chronic respiratory failure with hypercapnia: Secondary | ICD-10-CM | POA: Diagnosis not present

## 2016-06-24 DIAGNOSIS — J9611 Chronic respiratory failure with hypoxia: Secondary | ICD-10-CM | POA: Diagnosis not present

## 2016-06-24 DIAGNOSIS — I5023 Acute on chronic systolic (congestive) heart failure: Secondary | ICD-10-CM | POA: Diagnosis not present

## 2016-06-24 DIAGNOSIS — S51811D Laceration without foreign body of right forearm, subsequent encounter: Secondary | ICD-10-CM | POA: Diagnosis not present

## 2016-06-24 DIAGNOSIS — I110 Hypertensive heart disease with heart failure: Secondary | ICD-10-CM | POA: Diagnosis not present

## 2016-06-24 DIAGNOSIS — J441 Chronic obstructive pulmonary disease with (acute) exacerbation: Secondary | ICD-10-CM | POA: Diagnosis not present

## 2016-06-24 DIAGNOSIS — J9612 Chronic respiratory failure with hypercapnia: Secondary | ICD-10-CM | POA: Diagnosis not present

## 2016-06-25 ENCOUNTER — Telehealth: Payer: Self-pay | Admitting: Family

## 2016-06-25 ENCOUNTER — Ambulatory Visit: Payer: Medicare Other | Admitting: Family

## 2016-06-25 NOTE — Telephone Encounter (Signed)
Patient missed her initial appointment at [facility] on 06/25/16. Will attempt to reschedule.

## 2016-07-07 DIAGNOSIS — R911 Solitary pulmonary nodule: Secondary | ICD-10-CM | POA: Diagnosis not present

## 2016-07-14 DIAGNOSIS — Z6830 Body mass index (BMI) 30.0-30.9, adult: Secondary | ICD-10-CM | POA: Diagnosis not present

## 2016-07-14 DIAGNOSIS — F329 Major depressive disorder, single episode, unspecified: Secondary | ICD-10-CM | POA: Diagnosis not present

## 2016-07-14 DIAGNOSIS — I5022 Chronic systolic (congestive) heart failure: Secondary | ICD-10-CM | POA: Diagnosis not present

## 2016-07-14 DIAGNOSIS — I482 Chronic atrial fibrillation: Secondary | ICD-10-CM | POA: Diagnosis not present

## 2016-07-14 DIAGNOSIS — J449 Chronic obstructive pulmonary disease, unspecified: Secondary | ICD-10-CM | POA: Diagnosis not present

## 2016-07-15 DIAGNOSIS — Z6830 Body mass index (BMI) 30.0-30.9, adult: Secondary | ICD-10-CM | POA: Diagnosis not present

## 2016-07-15 DIAGNOSIS — J40 Bronchitis, not specified as acute or chronic: Secondary | ICD-10-CM | POA: Diagnosis not present

## 2016-07-15 DIAGNOSIS — F419 Anxiety disorder, unspecified: Secondary | ICD-10-CM | POA: Diagnosis not present

## 2016-07-15 DIAGNOSIS — I5023 Acute on chronic systolic (congestive) heart failure: Secondary | ICD-10-CM | POA: Diagnosis not present

## 2016-07-15 DIAGNOSIS — J42 Unspecified chronic bronchitis: Secondary | ICD-10-CM | POA: Diagnosis not present

## 2016-07-24 DIAGNOSIS — R0602 Shortness of breath: Secondary | ICD-10-CM | POA: Diagnosis not present

## 2016-07-24 DIAGNOSIS — R079 Chest pain, unspecified: Secondary | ICD-10-CM | POA: Diagnosis not present

## 2016-07-24 DIAGNOSIS — I420 Dilated cardiomyopathy: Secondary | ICD-10-CM | POA: Diagnosis not present

## 2016-07-24 DIAGNOSIS — I509 Heart failure, unspecified: Secondary | ICD-10-CM | POA: Diagnosis not present

## 2016-08-24 DIAGNOSIS — Z6831 Body mass index (BMI) 31.0-31.9, adult: Secondary | ICD-10-CM | POA: Diagnosis not present

## 2016-08-24 DIAGNOSIS — J449 Chronic obstructive pulmonary disease, unspecified: Secondary | ICD-10-CM | POA: Diagnosis not present

## 2016-08-24 DIAGNOSIS — I8310 Varicose veins of unspecified lower extremity with inflammation: Secondary | ICD-10-CM | POA: Diagnosis not present

## 2016-08-24 DIAGNOSIS — I5022 Chronic systolic (congestive) heart failure: Secondary | ICD-10-CM | POA: Diagnosis not present

## 2016-09-24 DIAGNOSIS — J9611 Chronic respiratory failure with hypoxia: Secondary | ICD-10-CM | POA: Diagnosis not present

## 2016-09-24 DIAGNOSIS — J441 Chronic obstructive pulmonary disease with (acute) exacerbation: Secondary | ICD-10-CM | POA: Diagnosis not present

## 2016-09-24 DIAGNOSIS — I110 Hypertensive heart disease with heart failure: Secondary | ICD-10-CM | POA: Diagnosis not present

## 2016-09-24 DIAGNOSIS — Z7952 Long term (current) use of systemic steroids: Secondary | ICD-10-CM | POA: Diagnosis not present

## 2016-09-24 DIAGNOSIS — F419 Anxiety disorder, unspecified: Secondary | ICD-10-CM | POA: Diagnosis not present

## 2016-09-24 DIAGNOSIS — Z7982 Long term (current) use of aspirin: Secondary | ICD-10-CM | POA: Diagnosis not present

## 2016-09-24 DIAGNOSIS — Z8701 Personal history of pneumonia (recurrent): Secondary | ICD-10-CM | POA: Diagnosis not present

## 2016-09-24 DIAGNOSIS — Z9181 History of falling: Secondary | ICD-10-CM | POA: Diagnosis not present

## 2016-09-24 DIAGNOSIS — I5023 Acute on chronic systolic (congestive) heart failure: Secondary | ICD-10-CM | POA: Diagnosis not present

## 2016-09-24 DIAGNOSIS — S51811D Laceration without foreign body of right forearm, subsequent encounter: Secondary | ICD-10-CM | POA: Diagnosis not present

## 2016-09-24 DIAGNOSIS — I8390 Asymptomatic varicose veins of unspecified lower extremity: Secondary | ICD-10-CM | POA: Diagnosis not present

## 2016-09-24 DIAGNOSIS — J9612 Chronic respiratory failure with hypercapnia: Secondary | ICD-10-CM | POA: Diagnosis not present

## 2016-09-24 DIAGNOSIS — I482 Chronic atrial fibrillation: Secondary | ICD-10-CM | POA: Diagnosis not present

## 2016-09-24 DIAGNOSIS — F329 Major depressive disorder, single episode, unspecified: Secondary | ICD-10-CM | POA: Diagnosis not present

## 2016-09-25 DIAGNOSIS — J9612 Chronic respiratory failure with hypercapnia: Secondary | ICD-10-CM | POA: Diagnosis not present

## 2016-09-25 DIAGNOSIS — I110 Hypertensive heart disease with heart failure: Secondary | ICD-10-CM | POA: Diagnosis not present

## 2016-09-25 DIAGNOSIS — S51811D Laceration without foreign body of right forearm, subsequent encounter: Secondary | ICD-10-CM | POA: Diagnosis not present

## 2016-09-25 DIAGNOSIS — I5023 Acute on chronic systolic (congestive) heart failure: Secondary | ICD-10-CM | POA: Diagnosis not present

## 2016-09-25 DIAGNOSIS — J441 Chronic obstructive pulmonary disease with (acute) exacerbation: Secondary | ICD-10-CM | POA: Diagnosis not present

## 2016-09-25 DIAGNOSIS — J9611 Chronic respiratory failure with hypoxia: Secondary | ICD-10-CM | POA: Diagnosis not present

## 2016-09-28 DIAGNOSIS — J449 Chronic obstructive pulmonary disease, unspecified: Secondary | ICD-10-CM | POA: Diagnosis not present

## 2016-09-28 DIAGNOSIS — I5022 Chronic systolic (congestive) heart failure: Secondary | ICD-10-CM | POA: Diagnosis not present

## 2016-09-28 DIAGNOSIS — Z6830 Body mass index (BMI) 30.0-30.9, adult: Secondary | ICD-10-CM | POA: Diagnosis not present

## 2016-09-29 DIAGNOSIS — I110 Hypertensive heart disease with heart failure: Secondary | ICD-10-CM | POA: Diagnosis not present

## 2016-09-29 DIAGNOSIS — I5023 Acute on chronic systolic (congestive) heart failure: Secondary | ICD-10-CM | POA: Diagnosis not present

## 2016-09-29 DIAGNOSIS — S51811D Laceration without foreign body of right forearm, subsequent encounter: Secondary | ICD-10-CM | POA: Diagnosis not present

## 2016-09-29 DIAGNOSIS — J9611 Chronic respiratory failure with hypoxia: Secondary | ICD-10-CM | POA: Diagnosis not present

## 2016-09-29 DIAGNOSIS — J441 Chronic obstructive pulmonary disease with (acute) exacerbation: Secondary | ICD-10-CM | POA: Diagnosis not present

## 2016-09-29 DIAGNOSIS — J9612 Chronic respiratory failure with hypercapnia: Secondary | ICD-10-CM | POA: Diagnosis not present

## 2016-10-01 DIAGNOSIS — I110 Hypertensive heart disease with heart failure: Secondary | ICD-10-CM | POA: Diagnosis not present

## 2016-10-01 DIAGNOSIS — J9611 Chronic respiratory failure with hypoxia: Secondary | ICD-10-CM | POA: Diagnosis not present

## 2016-10-01 DIAGNOSIS — I5023 Acute on chronic systolic (congestive) heart failure: Secondary | ICD-10-CM | POA: Diagnosis not present

## 2016-10-01 DIAGNOSIS — J441 Chronic obstructive pulmonary disease with (acute) exacerbation: Secondary | ICD-10-CM | POA: Diagnosis not present

## 2016-10-01 DIAGNOSIS — S51811D Laceration without foreign body of right forearm, subsequent encounter: Secondary | ICD-10-CM | POA: Diagnosis not present

## 2016-10-01 DIAGNOSIS — J9612 Chronic respiratory failure with hypercapnia: Secondary | ICD-10-CM | POA: Diagnosis not present

## 2016-10-05 DIAGNOSIS — I5023 Acute on chronic systolic (congestive) heart failure: Secondary | ICD-10-CM | POA: Diagnosis not present

## 2016-10-05 DIAGNOSIS — I110 Hypertensive heart disease with heart failure: Secondary | ICD-10-CM | POA: Diagnosis not present

## 2016-10-05 DIAGNOSIS — J9612 Chronic respiratory failure with hypercapnia: Secondary | ICD-10-CM | POA: Diagnosis not present

## 2016-10-05 DIAGNOSIS — S51811D Laceration without foreign body of right forearm, subsequent encounter: Secondary | ICD-10-CM | POA: Diagnosis not present

## 2016-10-05 DIAGNOSIS — J9611 Chronic respiratory failure with hypoxia: Secondary | ICD-10-CM | POA: Diagnosis not present

## 2016-10-05 DIAGNOSIS — J441 Chronic obstructive pulmonary disease with (acute) exacerbation: Secondary | ICD-10-CM | POA: Diagnosis not present

## 2016-10-06 DIAGNOSIS — S51811D Laceration without foreign body of right forearm, subsequent encounter: Secondary | ICD-10-CM | POA: Diagnosis not present

## 2016-10-06 DIAGNOSIS — J441 Chronic obstructive pulmonary disease with (acute) exacerbation: Secondary | ICD-10-CM | POA: Diagnosis not present

## 2016-10-06 DIAGNOSIS — I110 Hypertensive heart disease with heart failure: Secondary | ICD-10-CM | POA: Diagnosis not present

## 2016-10-06 DIAGNOSIS — I5023 Acute on chronic systolic (congestive) heart failure: Secondary | ICD-10-CM | POA: Diagnosis not present

## 2016-10-06 DIAGNOSIS — J9612 Chronic respiratory failure with hypercapnia: Secondary | ICD-10-CM | POA: Diagnosis not present

## 2016-10-06 DIAGNOSIS — J9611 Chronic respiratory failure with hypoxia: Secondary | ICD-10-CM | POA: Diagnosis not present

## 2016-10-09 DIAGNOSIS — I5023 Acute on chronic systolic (congestive) heart failure: Secondary | ICD-10-CM | POA: Diagnosis not present

## 2016-10-09 DIAGNOSIS — J9612 Chronic respiratory failure with hypercapnia: Secondary | ICD-10-CM | POA: Diagnosis not present

## 2016-10-09 DIAGNOSIS — S51811D Laceration without foreign body of right forearm, subsequent encounter: Secondary | ICD-10-CM | POA: Diagnosis not present

## 2016-10-09 DIAGNOSIS — J441 Chronic obstructive pulmonary disease with (acute) exacerbation: Secondary | ICD-10-CM | POA: Diagnosis not present

## 2016-10-09 DIAGNOSIS — J9611 Chronic respiratory failure with hypoxia: Secondary | ICD-10-CM | POA: Diagnosis not present

## 2016-10-09 DIAGNOSIS — I110 Hypertensive heart disease with heart failure: Secondary | ICD-10-CM | POA: Diagnosis not present

## 2016-10-10 DIAGNOSIS — I110 Hypertensive heart disease with heart failure: Secondary | ICD-10-CM | POA: Diagnosis not present

## 2016-10-10 DIAGNOSIS — J9611 Chronic respiratory failure with hypoxia: Secondary | ICD-10-CM | POA: Diagnosis not present

## 2016-10-10 DIAGNOSIS — J441 Chronic obstructive pulmonary disease with (acute) exacerbation: Secondary | ICD-10-CM | POA: Diagnosis not present

## 2016-10-10 DIAGNOSIS — J9612 Chronic respiratory failure with hypercapnia: Secondary | ICD-10-CM | POA: Diagnosis not present

## 2016-10-10 DIAGNOSIS — S51811D Laceration without foreign body of right forearm, subsequent encounter: Secondary | ICD-10-CM | POA: Diagnosis not present

## 2016-10-10 DIAGNOSIS — I5023 Acute on chronic systolic (congestive) heart failure: Secondary | ICD-10-CM | POA: Diagnosis not present

## 2016-10-12 DIAGNOSIS — J9612 Chronic respiratory failure with hypercapnia: Secondary | ICD-10-CM | POA: Diagnosis not present

## 2016-10-12 DIAGNOSIS — S51811D Laceration without foreign body of right forearm, subsequent encounter: Secondary | ICD-10-CM | POA: Diagnosis not present

## 2016-10-12 DIAGNOSIS — I110 Hypertensive heart disease with heart failure: Secondary | ICD-10-CM | POA: Diagnosis not present

## 2016-10-12 DIAGNOSIS — J441 Chronic obstructive pulmonary disease with (acute) exacerbation: Secondary | ICD-10-CM | POA: Diagnosis not present

## 2016-10-12 DIAGNOSIS — I5023 Acute on chronic systolic (congestive) heart failure: Secondary | ICD-10-CM | POA: Diagnosis not present

## 2016-10-12 DIAGNOSIS — J9611 Chronic respiratory failure with hypoxia: Secondary | ICD-10-CM | POA: Diagnosis not present

## 2016-10-13 DIAGNOSIS — I5023 Acute on chronic systolic (congestive) heart failure: Secondary | ICD-10-CM | POA: Diagnosis not present

## 2016-10-13 DIAGNOSIS — J9612 Chronic respiratory failure with hypercapnia: Secondary | ICD-10-CM | POA: Diagnosis not present

## 2016-10-13 DIAGNOSIS — J441 Chronic obstructive pulmonary disease with (acute) exacerbation: Secondary | ICD-10-CM | POA: Diagnosis not present

## 2016-10-13 DIAGNOSIS — J9611 Chronic respiratory failure with hypoxia: Secondary | ICD-10-CM | POA: Diagnosis not present

## 2016-10-13 DIAGNOSIS — I110 Hypertensive heart disease with heart failure: Secondary | ICD-10-CM | POA: Diagnosis not present

## 2016-10-13 DIAGNOSIS — S51811D Laceration without foreign body of right forearm, subsequent encounter: Secondary | ICD-10-CM | POA: Diagnosis not present

## 2016-10-15 DIAGNOSIS — J441 Chronic obstructive pulmonary disease with (acute) exacerbation: Secondary | ICD-10-CM | POA: Diagnosis not present

## 2016-10-15 DIAGNOSIS — I110 Hypertensive heart disease with heart failure: Secondary | ICD-10-CM | POA: Diagnosis not present

## 2016-10-15 DIAGNOSIS — J9612 Chronic respiratory failure with hypercapnia: Secondary | ICD-10-CM | POA: Diagnosis not present

## 2016-10-15 DIAGNOSIS — J9611 Chronic respiratory failure with hypoxia: Secondary | ICD-10-CM | POA: Diagnosis not present

## 2016-10-15 DIAGNOSIS — S51811D Laceration without foreign body of right forearm, subsequent encounter: Secondary | ICD-10-CM | POA: Diagnosis not present

## 2016-10-15 DIAGNOSIS — I5023 Acute on chronic systolic (congestive) heart failure: Secondary | ICD-10-CM | POA: Diagnosis not present

## 2016-10-16 DIAGNOSIS — J441 Chronic obstructive pulmonary disease with (acute) exacerbation: Secondary | ICD-10-CM | POA: Diagnosis not present

## 2016-10-16 DIAGNOSIS — J9612 Chronic respiratory failure with hypercapnia: Secondary | ICD-10-CM | POA: Diagnosis not present

## 2016-10-16 DIAGNOSIS — J9611 Chronic respiratory failure with hypoxia: Secondary | ICD-10-CM | POA: Diagnosis not present

## 2016-10-16 DIAGNOSIS — S51811D Laceration without foreign body of right forearm, subsequent encounter: Secondary | ICD-10-CM | POA: Diagnosis not present

## 2016-10-16 DIAGNOSIS — I110 Hypertensive heart disease with heart failure: Secondary | ICD-10-CM | POA: Diagnosis not present

## 2016-10-16 DIAGNOSIS — I5023 Acute on chronic systolic (congestive) heart failure: Secondary | ICD-10-CM | POA: Diagnosis not present

## 2016-10-20 DIAGNOSIS — I110 Hypertensive heart disease with heart failure: Secondary | ICD-10-CM | POA: Diagnosis not present

## 2016-10-20 DIAGNOSIS — J9611 Chronic respiratory failure with hypoxia: Secondary | ICD-10-CM | POA: Diagnosis not present

## 2016-10-20 DIAGNOSIS — J441 Chronic obstructive pulmonary disease with (acute) exacerbation: Secondary | ICD-10-CM | POA: Diagnosis not present

## 2016-10-20 DIAGNOSIS — S51811D Laceration without foreign body of right forearm, subsequent encounter: Secondary | ICD-10-CM | POA: Diagnosis not present

## 2016-10-20 DIAGNOSIS — J9612 Chronic respiratory failure with hypercapnia: Secondary | ICD-10-CM | POA: Diagnosis not present

## 2016-10-20 DIAGNOSIS — I5023 Acute on chronic systolic (congestive) heart failure: Secondary | ICD-10-CM | POA: Diagnosis not present

## 2016-10-21 DIAGNOSIS — S51811D Laceration without foreign body of right forearm, subsequent encounter: Secondary | ICD-10-CM | POA: Diagnosis not present

## 2016-10-21 DIAGNOSIS — I110 Hypertensive heart disease with heart failure: Secondary | ICD-10-CM | POA: Diagnosis not present

## 2016-10-21 DIAGNOSIS — J9612 Chronic respiratory failure with hypercapnia: Secondary | ICD-10-CM | POA: Diagnosis not present

## 2016-10-21 DIAGNOSIS — J9611 Chronic respiratory failure with hypoxia: Secondary | ICD-10-CM | POA: Diagnosis not present

## 2016-10-21 DIAGNOSIS — J441 Chronic obstructive pulmonary disease with (acute) exacerbation: Secondary | ICD-10-CM | POA: Diagnosis not present

## 2016-10-21 DIAGNOSIS — I5023 Acute on chronic systolic (congestive) heart failure: Secondary | ICD-10-CM | POA: Diagnosis not present

## 2016-10-23 DIAGNOSIS — I5023 Acute on chronic systolic (congestive) heart failure: Secondary | ICD-10-CM | POA: Diagnosis not present

## 2016-10-23 DIAGNOSIS — I110 Hypertensive heart disease with heart failure: Secondary | ICD-10-CM | POA: Diagnosis not present

## 2016-10-23 DIAGNOSIS — J9612 Chronic respiratory failure with hypercapnia: Secondary | ICD-10-CM | POA: Diagnosis not present

## 2016-10-23 DIAGNOSIS — J9611 Chronic respiratory failure with hypoxia: Secondary | ICD-10-CM | POA: Diagnosis not present

## 2016-10-23 DIAGNOSIS — S51811D Laceration without foreign body of right forearm, subsequent encounter: Secondary | ICD-10-CM | POA: Diagnosis not present

## 2016-10-23 DIAGNOSIS — J441 Chronic obstructive pulmonary disease with (acute) exacerbation: Secondary | ICD-10-CM | POA: Diagnosis not present

## 2016-10-25 DIAGNOSIS — J441 Chronic obstructive pulmonary disease with (acute) exacerbation: Secondary | ICD-10-CM | POA: Diagnosis not present

## 2016-10-25 DIAGNOSIS — I5023 Acute on chronic systolic (congestive) heart failure: Secondary | ICD-10-CM | POA: Diagnosis not present

## 2016-10-25 DIAGNOSIS — Z8701 Personal history of pneumonia (recurrent): Secondary | ICD-10-CM | POA: Diagnosis not present

## 2016-10-25 DIAGNOSIS — Z7952 Long term (current) use of systemic steroids: Secondary | ICD-10-CM | POA: Diagnosis not present

## 2016-10-25 DIAGNOSIS — Z7982 Long term (current) use of aspirin: Secondary | ICD-10-CM | POA: Diagnosis not present

## 2016-10-25 DIAGNOSIS — S51811D Laceration without foreign body of right forearm, subsequent encounter: Secondary | ICD-10-CM | POA: Diagnosis not present

## 2016-10-25 DIAGNOSIS — I482 Chronic atrial fibrillation: Secondary | ICD-10-CM | POA: Diagnosis not present

## 2016-10-25 DIAGNOSIS — I8390 Asymptomatic varicose veins of unspecified lower extremity: Secondary | ICD-10-CM | POA: Diagnosis not present

## 2016-10-25 DIAGNOSIS — F419 Anxiety disorder, unspecified: Secondary | ICD-10-CM | POA: Diagnosis not present

## 2016-10-25 DIAGNOSIS — Z9181 History of falling: Secondary | ICD-10-CM | POA: Diagnosis not present

## 2016-10-25 DIAGNOSIS — F329 Major depressive disorder, single episode, unspecified: Secondary | ICD-10-CM | POA: Diagnosis not present

## 2016-10-25 DIAGNOSIS — I110 Hypertensive heart disease with heart failure: Secondary | ICD-10-CM | POA: Diagnosis not present

## 2016-10-25 DIAGNOSIS — J9611 Chronic respiratory failure with hypoxia: Secondary | ICD-10-CM | POA: Diagnosis not present

## 2016-10-25 DIAGNOSIS — J9612 Chronic respiratory failure with hypercapnia: Secondary | ICD-10-CM | POA: Diagnosis not present

## 2016-10-26 DIAGNOSIS — J441 Chronic obstructive pulmonary disease with (acute) exacerbation: Secondary | ICD-10-CM | POA: Diagnosis not present

## 2016-10-26 DIAGNOSIS — S51811D Laceration without foreign body of right forearm, subsequent encounter: Secondary | ICD-10-CM | POA: Diagnosis not present

## 2016-10-26 DIAGNOSIS — J9612 Chronic respiratory failure with hypercapnia: Secondary | ICD-10-CM | POA: Diagnosis not present

## 2016-10-26 DIAGNOSIS — I5023 Acute on chronic systolic (congestive) heart failure: Secondary | ICD-10-CM | POA: Diagnosis not present

## 2016-10-26 DIAGNOSIS — I110 Hypertensive heart disease with heart failure: Secondary | ICD-10-CM | POA: Diagnosis not present

## 2016-10-26 DIAGNOSIS — J9611 Chronic respiratory failure with hypoxia: Secondary | ICD-10-CM | POA: Diagnosis not present

## 2016-10-30 DIAGNOSIS — J9611 Chronic respiratory failure with hypoxia: Secondary | ICD-10-CM | POA: Diagnosis not present

## 2016-10-30 DIAGNOSIS — S51811D Laceration without foreign body of right forearm, subsequent encounter: Secondary | ICD-10-CM | POA: Diagnosis not present

## 2016-10-30 DIAGNOSIS — I110 Hypertensive heart disease with heart failure: Secondary | ICD-10-CM | POA: Diagnosis not present

## 2016-10-30 DIAGNOSIS — J9612 Chronic respiratory failure with hypercapnia: Secondary | ICD-10-CM | POA: Diagnosis not present

## 2016-10-30 DIAGNOSIS — I5023 Acute on chronic systolic (congestive) heart failure: Secondary | ICD-10-CM | POA: Diagnosis not present

## 2016-10-30 DIAGNOSIS — J441 Chronic obstructive pulmonary disease with (acute) exacerbation: Secondary | ICD-10-CM | POA: Diagnosis not present

## 2016-11-01 DIAGNOSIS — I110 Hypertensive heart disease with heart failure: Secondary | ICD-10-CM | POA: Diagnosis not present

## 2016-11-01 DIAGNOSIS — J9611 Chronic respiratory failure with hypoxia: Secondary | ICD-10-CM | POA: Diagnosis not present

## 2016-11-01 DIAGNOSIS — J441 Chronic obstructive pulmonary disease with (acute) exacerbation: Secondary | ICD-10-CM | POA: Diagnosis not present

## 2016-11-01 DIAGNOSIS — I5023 Acute on chronic systolic (congestive) heart failure: Secondary | ICD-10-CM | POA: Diagnosis not present

## 2016-11-01 DIAGNOSIS — S51811D Laceration without foreign body of right forearm, subsequent encounter: Secondary | ICD-10-CM | POA: Diagnosis not present

## 2016-11-01 DIAGNOSIS — J9612 Chronic respiratory failure with hypercapnia: Secondary | ICD-10-CM | POA: Diagnosis not present

## 2016-11-02 DIAGNOSIS — J441 Chronic obstructive pulmonary disease with (acute) exacerbation: Secondary | ICD-10-CM | POA: Diagnosis not present

## 2016-11-02 DIAGNOSIS — I110 Hypertensive heart disease with heart failure: Secondary | ICD-10-CM | POA: Diagnosis not present

## 2016-11-02 DIAGNOSIS — S51811D Laceration without foreign body of right forearm, subsequent encounter: Secondary | ICD-10-CM | POA: Diagnosis not present

## 2016-11-02 DIAGNOSIS — J9612 Chronic respiratory failure with hypercapnia: Secondary | ICD-10-CM | POA: Diagnosis not present

## 2016-11-02 DIAGNOSIS — I5023 Acute on chronic systolic (congestive) heart failure: Secondary | ICD-10-CM | POA: Diagnosis not present

## 2016-11-02 DIAGNOSIS — J9611 Chronic respiratory failure with hypoxia: Secondary | ICD-10-CM | POA: Diagnosis not present

## 2016-11-03 DIAGNOSIS — I110 Hypertensive heart disease with heart failure: Secondary | ICD-10-CM | POA: Diagnosis not present

## 2016-11-03 DIAGNOSIS — I5023 Acute on chronic systolic (congestive) heart failure: Secondary | ICD-10-CM | POA: Diagnosis not present

## 2016-11-03 DIAGNOSIS — J441 Chronic obstructive pulmonary disease with (acute) exacerbation: Secondary | ICD-10-CM | POA: Diagnosis not present

## 2016-11-03 DIAGNOSIS — J9611 Chronic respiratory failure with hypoxia: Secondary | ICD-10-CM | POA: Diagnosis not present

## 2016-11-03 DIAGNOSIS — J9612 Chronic respiratory failure with hypercapnia: Secondary | ICD-10-CM | POA: Diagnosis not present

## 2016-11-03 DIAGNOSIS — S51811D Laceration without foreign body of right forearm, subsequent encounter: Secondary | ICD-10-CM | POA: Diagnosis not present

## 2016-11-05 DIAGNOSIS — S51811D Laceration without foreign body of right forearm, subsequent encounter: Secondary | ICD-10-CM | POA: Diagnosis not present

## 2016-11-05 DIAGNOSIS — J9612 Chronic respiratory failure with hypercapnia: Secondary | ICD-10-CM | POA: Diagnosis not present

## 2016-11-05 DIAGNOSIS — I110 Hypertensive heart disease with heart failure: Secondary | ICD-10-CM | POA: Diagnosis not present

## 2016-11-05 DIAGNOSIS — J9611 Chronic respiratory failure with hypoxia: Secondary | ICD-10-CM | POA: Diagnosis not present

## 2016-11-05 DIAGNOSIS — J441 Chronic obstructive pulmonary disease with (acute) exacerbation: Secondary | ICD-10-CM | POA: Diagnosis not present

## 2016-11-05 DIAGNOSIS — I5023 Acute on chronic systolic (congestive) heart failure: Secondary | ICD-10-CM | POA: Diagnosis not present

## 2016-11-08 DIAGNOSIS — I5023 Acute on chronic systolic (congestive) heart failure: Secondary | ICD-10-CM | POA: Diagnosis not present

## 2016-11-08 DIAGNOSIS — S51811D Laceration without foreign body of right forearm, subsequent encounter: Secondary | ICD-10-CM | POA: Diagnosis not present

## 2016-11-08 DIAGNOSIS — I110 Hypertensive heart disease with heart failure: Secondary | ICD-10-CM | POA: Diagnosis not present

## 2016-11-08 DIAGNOSIS — J9611 Chronic respiratory failure with hypoxia: Secondary | ICD-10-CM | POA: Diagnosis not present

## 2016-11-08 DIAGNOSIS — J9612 Chronic respiratory failure with hypercapnia: Secondary | ICD-10-CM | POA: Diagnosis not present

## 2016-11-08 DIAGNOSIS — J441 Chronic obstructive pulmonary disease with (acute) exacerbation: Secondary | ICD-10-CM | POA: Diagnosis not present

## 2016-11-09 DIAGNOSIS — I5023 Acute on chronic systolic (congestive) heart failure: Secondary | ICD-10-CM | POA: Diagnosis not present

## 2016-11-09 DIAGNOSIS — J9612 Chronic respiratory failure with hypercapnia: Secondary | ICD-10-CM | POA: Diagnosis not present

## 2016-11-09 DIAGNOSIS — J441 Chronic obstructive pulmonary disease with (acute) exacerbation: Secondary | ICD-10-CM | POA: Diagnosis not present

## 2016-11-09 DIAGNOSIS — J9611 Chronic respiratory failure with hypoxia: Secondary | ICD-10-CM | POA: Diagnosis not present

## 2016-11-09 DIAGNOSIS — S51811D Laceration without foreign body of right forearm, subsequent encounter: Secondary | ICD-10-CM | POA: Diagnosis not present

## 2016-11-09 DIAGNOSIS — I110 Hypertensive heart disease with heart failure: Secondary | ICD-10-CM | POA: Diagnosis not present

## 2016-11-12 DIAGNOSIS — J9612 Chronic respiratory failure with hypercapnia: Secondary | ICD-10-CM | POA: Diagnosis not present

## 2016-11-12 DIAGNOSIS — S51811D Laceration without foreign body of right forearm, subsequent encounter: Secondary | ICD-10-CM | POA: Diagnosis not present

## 2016-11-12 DIAGNOSIS — I110 Hypertensive heart disease with heart failure: Secondary | ICD-10-CM | POA: Diagnosis not present

## 2016-11-12 DIAGNOSIS — I5023 Acute on chronic systolic (congestive) heart failure: Secondary | ICD-10-CM | POA: Diagnosis not present

## 2016-11-12 DIAGNOSIS — J9611 Chronic respiratory failure with hypoxia: Secondary | ICD-10-CM | POA: Diagnosis not present

## 2016-11-12 DIAGNOSIS — J441 Chronic obstructive pulmonary disease with (acute) exacerbation: Secondary | ICD-10-CM | POA: Diagnosis not present

## 2016-11-15 DIAGNOSIS — S51811D Laceration without foreign body of right forearm, subsequent encounter: Secondary | ICD-10-CM | POA: Diagnosis not present

## 2016-11-15 DIAGNOSIS — I110 Hypertensive heart disease with heart failure: Secondary | ICD-10-CM | POA: Diagnosis not present

## 2016-11-15 DIAGNOSIS — J9612 Chronic respiratory failure with hypercapnia: Secondary | ICD-10-CM | POA: Diagnosis not present

## 2016-11-15 DIAGNOSIS — J9611 Chronic respiratory failure with hypoxia: Secondary | ICD-10-CM | POA: Diagnosis not present

## 2016-11-15 DIAGNOSIS — J441 Chronic obstructive pulmonary disease with (acute) exacerbation: Secondary | ICD-10-CM | POA: Diagnosis not present

## 2016-11-15 DIAGNOSIS — I5023 Acute on chronic systolic (congestive) heart failure: Secondary | ICD-10-CM | POA: Diagnosis not present

## 2016-11-17 DIAGNOSIS — S51811D Laceration without foreign body of right forearm, subsequent encounter: Secondary | ICD-10-CM | POA: Diagnosis not present

## 2016-11-17 DIAGNOSIS — I5023 Acute on chronic systolic (congestive) heart failure: Secondary | ICD-10-CM | POA: Diagnosis not present

## 2016-11-17 DIAGNOSIS — I110 Hypertensive heart disease with heart failure: Secondary | ICD-10-CM | POA: Diagnosis not present

## 2016-11-17 DIAGNOSIS — J9611 Chronic respiratory failure with hypoxia: Secondary | ICD-10-CM | POA: Diagnosis not present

## 2016-11-17 DIAGNOSIS — J441 Chronic obstructive pulmonary disease with (acute) exacerbation: Secondary | ICD-10-CM | POA: Diagnosis not present

## 2016-11-17 DIAGNOSIS — J9612 Chronic respiratory failure with hypercapnia: Secondary | ICD-10-CM | POA: Diagnosis not present

## 2016-11-19 DIAGNOSIS — I110 Hypertensive heart disease with heart failure: Secondary | ICD-10-CM | POA: Diagnosis not present

## 2016-11-19 DIAGNOSIS — I5023 Acute on chronic systolic (congestive) heart failure: Secondary | ICD-10-CM | POA: Diagnosis not present

## 2016-11-19 DIAGNOSIS — J9612 Chronic respiratory failure with hypercapnia: Secondary | ICD-10-CM | POA: Diagnosis not present

## 2016-11-19 DIAGNOSIS — J9611 Chronic respiratory failure with hypoxia: Secondary | ICD-10-CM | POA: Diagnosis not present

## 2016-11-19 DIAGNOSIS — J441 Chronic obstructive pulmonary disease with (acute) exacerbation: Secondary | ICD-10-CM | POA: Diagnosis not present

## 2016-11-19 DIAGNOSIS — S51811D Laceration without foreign body of right forearm, subsequent encounter: Secondary | ICD-10-CM | POA: Diagnosis not present

## 2016-11-20 DIAGNOSIS — I5023 Acute on chronic systolic (congestive) heart failure: Secondary | ICD-10-CM | POA: Diagnosis not present

## 2016-11-20 DIAGNOSIS — J9612 Chronic respiratory failure with hypercapnia: Secondary | ICD-10-CM | POA: Diagnosis not present

## 2016-11-20 DIAGNOSIS — J441 Chronic obstructive pulmonary disease with (acute) exacerbation: Secondary | ICD-10-CM | POA: Diagnosis not present

## 2016-11-20 DIAGNOSIS — S51811D Laceration without foreign body of right forearm, subsequent encounter: Secondary | ICD-10-CM | POA: Diagnosis not present

## 2016-11-20 DIAGNOSIS — J9611 Chronic respiratory failure with hypoxia: Secondary | ICD-10-CM | POA: Diagnosis not present

## 2016-11-20 DIAGNOSIS — I110 Hypertensive heart disease with heart failure: Secondary | ICD-10-CM | POA: Diagnosis not present

## 2016-11-23 DIAGNOSIS — J9612 Chronic respiratory failure with hypercapnia: Secondary | ICD-10-CM | POA: Diagnosis not present

## 2016-11-23 DIAGNOSIS — J9611 Chronic respiratory failure with hypoxia: Secondary | ICD-10-CM | POA: Diagnosis not present

## 2016-11-23 DIAGNOSIS — I110 Hypertensive heart disease with heart failure: Secondary | ICD-10-CM | POA: Diagnosis not present

## 2016-11-23 DIAGNOSIS — S51811D Laceration without foreign body of right forearm, subsequent encounter: Secondary | ICD-10-CM | POA: Diagnosis not present

## 2016-11-23 DIAGNOSIS — J441 Chronic obstructive pulmonary disease with (acute) exacerbation: Secondary | ICD-10-CM | POA: Diagnosis not present

## 2016-11-23 DIAGNOSIS — I5023 Acute on chronic systolic (congestive) heart failure: Secondary | ICD-10-CM | POA: Diagnosis not present

## 2016-11-24 DIAGNOSIS — F329 Major depressive disorder, single episode, unspecified: Secondary | ICD-10-CM | POA: Diagnosis not present

## 2016-11-24 DIAGNOSIS — I110 Hypertensive heart disease with heart failure: Secondary | ICD-10-CM | POA: Diagnosis not present

## 2016-11-24 DIAGNOSIS — J9611 Chronic respiratory failure with hypoxia: Secondary | ICD-10-CM | POA: Diagnosis not present

## 2016-11-24 DIAGNOSIS — I482 Chronic atrial fibrillation: Secondary | ICD-10-CM | POA: Diagnosis not present

## 2016-11-24 DIAGNOSIS — J441 Chronic obstructive pulmonary disease with (acute) exacerbation: Secondary | ICD-10-CM | POA: Diagnosis not present

## 2016-11-24 DIAGNOSIS — F419 Anxiety disorder, unspecified: Secondary | ICD-10-CM | POA: Diagnosis not present

## 2016-11-24 DIAGNOSIS — J9612 Chronic respiratory failure with hypercapnia: Secondary | ICD-10-CM | POA: Diagnosis not present

## 2016-11-24 DIAGNOSIS — I5023 Acute on chronic systolic (congestive) heart failure: Secondary | ICD-10-CM | POA: Diagnosis not present

## 2016-11-26 DIAGNOSIS — I5023 Acute on chronic systolic (congestive) heart failure: Secondary | ICD-10-CM | POA: Diagnosis not present

## 2016-11-26 DIAGNOSIS — I482 Chronic atrial fibrillation: Secondary | ICD-10-CM | POA: Diagnosis not present

## 2016-11-26 DIAGNOSIS — J9612 Chronic respiratory failure with hypercapnia: Secondary | ICD-10-CM | POA: Diagnosis not present

## 2016-11-26 DIAGNOSIS — J9611 Chronic respiratory failure with hypoxia: Secondary | ICD-10-CM | POA: Diagnosis not present

## 2016-11-26 DIAGNOSIS — J441 Chronic obstructive pulmonary disease with (acute) exacerbation: Secondary | ICD-10-CM | POA: Diagnosis not present

## 2016-11-26 DIAGNOSIS — I110 Hypertensive heart disease with heart failure: Secondary | ICD-10-CM | POA: Diagnosis not present

## 2016-11-29 DIAGNOSIS — J9612 Chronic respiratory failure with hypercapnia: Secondary | ICD-10-CM | POA: Diagnosis not present

## 2016-11-29 DIAGNOSIS — J9611 Chronic respiratory failure with hypoxia: Secondary | ICD-10-CM | POA: Diagnosis not present

## 2016-11-29 DIAGNOSIS — J441 Chronic obstructive pulmonary disease with (acute) exacerbation: Secondary | ICD-10-CM | POA: Diagnosis not present

## 2016-11-29 DIAGNOSIS — I482 Chronic atrial fibrillation: Secondary | ICD-10-CM | POA: Diagnosis not present

## 2016-11-29 DIAGNOSIS — I110 Hypertensive heart disease with heart failure: Secondary | ICD-10-CM | POA: Diagnosis not present

## 2016-11-29 DIAGNOSIS — I5023 Acute on chronic systolic (congestive) heart failure: Secondary | ICD-10-CM | POA: Diagnosis not present

## 2016-11-30 DIAGNOSIS — I482 Chronic atrial fibrillation: Secondary | ICD-10-CM | POA: Diagnosis not present

## 2016-11-30 DIAGNOSIS — I110 Hypertensive heart disease with heart failure: Secondary | ICD-10-CM | POA: Diagnosis not present

## 2016-11-30 DIAGNOSIS — J9611 Chronic respiratory failure with hypoxia: Secondary | ICD-10-CM | POA: Diagnosis not present

## 2016-11-30 DIAGNOSIS — J9612 Chronic respiratory failure with hypercapnia: Secondary | ICD-10-CM | POA: Diagnosis not present

## 2016-11-30 DIAGNOSIS — I5023 Acute on chronic systolic (congestive) heart failure: Secondary | ICD-10-CM | POA: Diagnosis not present

## 2016-11-30 DIAGNOSIS — J441 Chronic obstructive pulmonary disease with (acute) exacerbation: Secondary | ICD-10-CM | POA: Diagnosis not present

## 2016-12-01 DIAGNOSIS — I110 Hypertensive heart disease with heart failure: Secondary | ICD-10-CM | POA: Diagnosis not present

## 2016-12-01 DIAGNOSIS — J9611 Chronic respiratory failure with hypoxia: Secondary | ICD-10-CM | POA: Diagnosis not present

## 2016-12-01 DIAGNOSIS — J441 Chronic obstructive pulmonary disease with (acute) exacerbation: Secondary | ICD-10-CM | POA: Diagnosis not present

## 2016-12-01 DIAGNOSIS — I5023 Acute on chronic systolic (congestive) heart failure: Secondary | ICD-10-CM | POA: Diagnosis not present

## 2016-12-01 DIAGNOSIS — I482 Chronic atrial fibrillation: Secondary | ICD-10-CM | POA: Diagnosis not present

## 2016-12-01 DIAGNOSIS — J9612 Chronic respiratory failure with hypercapnia: Secondary | ICD-10-CM | POA: Diagnosis not present

## 2016-12-03 DIAGNOSIS — J9611 Chronic respiratory failure with hypoxia: Secondary | ICD-10-CM | POA: Diagnosis not present

## 2016-12-03 DIAGNOSIS — I5023 Acute on chronic systolic (congestive) heart failure: Secondary | ICD-10-CM | POA: Diagnosis not present

## 2016-12-03 DIAGNOSIS — I110 Hypertensive heart disease with heart failure: Secondary | ICD-10-CM | POA: Diagnosis not present

## 2016-12-03 DIAGNOSIS — J9612 Chronic respiratory failure with hypercapnia: Secondary | ICD-10-CM | POA: Diagnosis not present

## 2016-12-03 DIAGNOSIS — I482 Chronic atrial fibrillation: Secondary | ICD-10-CM | POA: Diagnosis not present

## 2016-12-03 DIAGNOSIS — J441 Chronic obstructive pulmonary disease with (acute) exacerbation: Secondary | ICD-10-CM | POA: Diagnosis not present

## 2016-12-04 DIAGNOSIS — I5023 Acute on chronic systolic (congestive) heart failure: Secondary | ICD-10-CM | POA: Diagnosis not present

## 2016-12-04 DIAGNOSIS — J9612 Chronic respiratory failure with hypercapnia: Secondary | ICD-10-CM | POA: Diagnosis not present

## 2016-12-04 DIAGNOSIS — J441 Chronic obstructive pulmonary disease with (acute) exacerbation: Secondary | ICD-10-CM | POA: Diagnosis not present

## 2016-12-04 DIAGNOSIS — I110 Hypertensive heart disease with heart failure: Secondary | ICD-10-CM | POA: Diagnosis not present

## 2016-12-04 DIAGNOSIS — I482 Chronic atrial fibrillation: Secondary | ICD-10-CM | POA: Diagnosis not present

## 2016-12-04 DIAGNOSIS — J9611 Chronic respiratory failure with hypoxia: Secondary | ICD-10-CM | POA: Diagnosis not present

## 2016-12-08 DIAGNOSIS — J9611 Chronic respiratory failure with hypoxia: Secondary | ICD-10-CM | POA: Diagnosis not present

## 2016-12-08 DIAGNOSIS — J9612 Chronic respiratory failure with hypercapnia: Secondary | ICD-10-CM | POA: Diagnosis not present

## 2016-12-08 DIAGNOSIS — I110 Hypertensive heart disease with heart failure: Secondary | ICD-10-CM | POA: Diagnosis not present

## 2016-12-08 DIAGNOSIS — I5023 Acute on chronic systolic (congestive) heart failure: Secondary | ICD-10-CM | POA: Diagnosis not present

## 2016-12-08 DIAGNOSIS — I482 Chronic atrial fibrillation: Secondary | ICD-10-CM | POA: Diagnosis not present

## 2016-12-08 DIAGNOSIS — J441 Chronic obstructive pulmonary disease with (acute) exacerbation: Secondary | ICD-10-CM | POA: Diagnosis not present

## 2016-12-09 DIAGNOSIS — J9611 Chronic respiratory failure with hypoxia: Secondary | ICD-10-CM | POA: Diagnosis not present

## 2016-12-09 DIAGNOSIS — J9612 Chronic respiratory failure with hypercapnia: Secondary | ICD-10-CM | POA: Diagnosis not present

## 2016-12-09 DIAGNOSIS — J441 Chronic obstructive pulmonary disease with (acute) exacerbation: Secondary | ICD-10-CM | POA: Diagnosis not present

## 2016-12-09 DIAGNOSIS — I482 Chronic atrial fibrillation: Secondary | ICD-10-CM | POA: Diagnosis not present

## 2016-12-09 DIAGNOSIS — I110 Hypertensive heart disease with heart failure: Secondary | ICD-10-CM | POA: Diagnosis not present

## 2016-12-09 DIAGNOSIS — I5023 Acute on chronic systolic (congestive) heart failure: Secondary | ICD-10-CM | POA: Diagnosis not present

## 2016-12-10 DIAGNOSIS — I482 Chronic atrial fibrillation: Secondary | ICD-10-CM | POA: Diagnosis not present

## 2016-12-10 DIAGNOSIS — J9612 Chronic respiratory failure with hypercapnia: Secondary | ICD-10-CM | POA: Diagnosis not present

## 2016-12-10 DIAGNOSIS — J9611 Chronic respiratory failure with hypoxia: Secondary | ICD-10-CM | POA: Diagnosis not present

## 2016-12-10 DIAGNOSIS — I5023 Acute on chronic systolic (congestive) heart failure: Secondary | ICD-10-CM | POA: Diagnosis not present

## 2016-12-10 DIAGNOSIS — I110 Hypertensive heart disease with heart failure: Secondary | ICD-10-CM | POA: Diagnosis not present

## 2016-12-10 DIAGNOSIS — J441 Chronic obstructive pulmonary disease with (acute) exacerbation: Secondary | ICD-10-CM | POA: Diagnosis not present

## 2016-12-11 DIAGNOSIS — I482 Chronic atrial fibrillation: Secondary | ICD-10-CM | POA: Diagnosis not present

## 2016-12-11 DIAGNOSIS — J9612 Chronic respiratory failure with hypercapnia: Secondary | ICD-10-CM | POA: Diagnosis not present

## 2016-12-11 DIAGNOSIS — I110 Hypertensive heart disease with heart failure: Secondary | ICD-10-CM | POA: Diagnosis not present

## 2016-12-11 DIAGNOSIS — J441 Chronic obstructive pulmonary disease with (acute) exacerbation: Secondary | ICD-10-CM | POA: Diagnosis not present

## 2016-12-11 DIAGNOSIS — I5023 Acute on chronic systolic (congestive) heart failure: Secondary | ICD-10-CM | POA: Diagnosis not present

## 2016-12-11 DIAGNOSIS — J9611 Chronic respiratory failure with hypoxia: Secondary | ICD-10-CM | POA: Diagnosis not present

## 2016-12-14 DIAGNOSIS — I482 Chronic atrial fibrillation: Secondary | ICD-10-CM | POA: Diagnosis not present

## 2016-12-14 DIAGNOSIS — J9612 Chronic respiratory failure with hypercapnia: Secondary | ICD-10-CM | POA: Diagnosis not present

## 2016-12-14 DIAGNOSIS — J441 Chronic obstructive pulmonary disease with (acute) exacerbation: Secondary | ICD-10-CM | POA: Diagnosis not present

## 2016-12-14 DIAGNOSIS — J9611 Chronic respiratory failure with hypoxia: Secondary | ICD-10-CM | POA: Diagnosis not present

## 2016-12-14 DIAGNOSIS — I5023 Acute on chronic systolic (congestive) heart failure: Secondary | ICD-10-CM | POA: Diagnosis not present

## 2016-12-14 DIAGNOSIS — I110 Hypertensive heart disease with heart failure: Secondary | ICD-10-CM | POA: Diagnosis not present

## 2016-12-22 DIAGNOSIS — E860 Dehydration: Secondary | ICD-10-CM | POA: Diagnosis not present

## 2016-12-22 DIAGNOSIS — I509 Heart failure, unspecified: Secondary | ICD-10-CM | POA: Diagnosis not present

## 2016-12-22 DIAGNOSIS — J441 Chronic obstructive pulmonary disease with (acute) exacerbation: Secondary | ICD-10-CM | POA: Diagnosis not present

## 2016-12-22 DIAGNOSIS — Z66 Do not resuscitate: Secondary | ICD-10-CM | POA: Diagnosis not present

## 2016-12-22 DIAGNOSIS — I482 Chronic atrial fibrillation: Secondary | ICD-10-CM | POA: Diagnosis not present

## 2016-12-22 DIAGNOSIS — J069 Acute upper respiratory infection, unspecified: Secondary | ICD-10-CM | POA: Diagnosis present

## 2016-12-22 DIAGNOSIS — J80 Acute respiratory distress syndrome: Secondary | ICD-10-CM | POA: Diagnosis not present

## 2016-12-22 DIAGNOSIS — Z6825 Body mass index (BMI) 25.0-25.9, adult: Secondary | ICD-10-CM | POA: Diagnosis not present

## 2016-12-22 DIAGNOSIS — Z7951 Long term (current) use of inhaled steroids: Secondary | ICD-10-CM | POA: Diagnosis not present

## 2016-12-22 DIAGNOSIS — I959 Hypotension, unspecified: Secondary | ICD-10-CM | POA: Diagnosis not present

## 2016-12-22 DIAGNOSIS — I5022 Chronic systolic (congestive) heart failure: Secondary | ICD-10-CM | POA: Diagnosis not present

## 2016-12-22 DIAGNOSIS — Z882 Allergy status to sulfonamides status: Secondary | ICD-10-CM | POA: Diagnosis not present

## 2016-12-22 DIAGNOSIS — Z87891 Personal history of nicotine dependence: Secondary | ICD-10-CM | POA: Diagnosis not present

## 2016-12-22 DIAGNOSIS — Z7982 Long term (current) use of aspirin: Secondary | ICD-10-CM | POA: Diagnosis not present

## 2016-12-22 DIAGNOSIS — R05 Cough: Secondary | ICD-10-CM | POA: Diagnosis not present

## 2016-12-22 DIAGNOSIS — R000 Tachycardia, unspecified: Secondary | ICD-10-CM | POA: Diagnosis not present

## 2016-12-22 DIAGNOSIS — R52 Pain, unspecified: Secondary | ICD-10-CM | POA: Diagnosis not present

## 2016-12-22 DIAGNOSIS — R509 Fever, unspecified: Secondary | ICD-10-CM | POA: Diagnosis not present

## 2016-12-22 DIAGNOSIS — G8929 Other chronic pain: Secondary | ICD-10-CM | POA: Diagnosis present

## 2016-12-22 DIAGNOSIS — R0602 Shortness of breath: Secondary | ICD-10-CM | POA: Diagnosis not present

## 2016-12-22 DIAGNOSIS — R7989 Other specified abnormal findings of blood chemistry: Secondary | ICD-10-CM | POA: Diagnosis not present

## 2016-12-24 DIAGNOSIS — Z6825 Body mass index (BMI) 25.0-25.9, adult: Secondary | ICD-10-CM | POA: Diagnosis not present

## 2016-12-24 DIAGNOSIS — I959 Hypotension, unspecified: Secondary | ICD-10-CM | POA: Diagnosis not present

## 2016-12-24 DIAGNOSIS — R0902 Hypoxemia: Secondary | ICD-10-CM | POA: Diagnosis not present

## 2016-12-25 DIAGNOSIS — J441 Chronic obstructive pulmonary disease with (acute) exacerbation: Secondary | ICD-10-CM | POA: Diagnosis not present

## 2016-12-25 DIAGNOSIS — I8310 Varicose veins of unspecified lower extremity with inflammation: Secondary | ICD-10-CM | POA: Diagnosis not present

## 2016-12-25 DIAGNOSIS — Z7982 Long term (current) use of aspirin: Secondary | ICD-10-CM | POA: Diagnosis not present

## 2016-12-25 DIAGNOSIS — F329 Major depressive disorder, single episode, unspecified: Secondary | ICD-10-CM | POA: Diagnosis not present

## 2016-12-25 DIAGNOSIS — I110 Hypertensive heart disease with heart failure: Secondary | ICD-10-CM | POA: Diagnosis not present

## 2016-12-25 DIAGNOSIS — Z7952 Long term (current) use of systemic steroids: Secondary | ICD-10-CM | POA: Diagnosis not present

## 2016-12-25 DIAGNOSIS — Z87891 Personal history of nicotine dependence: Secondary | ICD-10-CM | POA: Diagnosis not present

## 2016-12-25 DIAGNOSIS — I5022 Chronic systolic (congestive) heart failure: Secondary | ICD-10-CM | POA: Diagnosis not present

## 2016-12-25 DIAGNOSIS — F419 Anxiety disorder, unspecified: Secondary | ICD-10-CM | POA: Diagnosis not present

## 2016-12-25 DIAGNOSIS — I959 Hypotension, unspecified: Secondary | ICD-10-CM | POA: Diagnosis not present

## 2016-12-25 DIAGNOSIS — Z79891 Long term (current) use of opiate analgesic: Secondary | ICD-10-CM | POA: Diagnosis not present

## 2016-12-25 DIAGNOSIS — I482 Chronic atrial fibrillation: Secondary | ICD-10-CM | POA: Diagnosis not present

## 2016-12-25 DIAGNOSIS — J069 Acute upper respiratory infection, unspecified: Secondary | ICD-10-CM | POA: Diagnosis not present

## 2016-12-25 DIAGNOSIS — Z9181 History of falling: Secondary | ICD-10-CM | POA: Diagnosis not present

## 2016-12-25 DIAGNOSIS — Z9981 Dependence on supplemental oxygen: Secondary | ICD-10-CM | POA: Diagnosis not present

## 2016-12-28 DIAGNOSIS — I482 Chronic atrial fibrillation: Secondary | ICD-10-CM | POA: Diagnosis not present

## 2016-12-28 DIAGNOSIS — J441 Chronic obstructive pulmonary disease with (acute) exacerbation: Secondary | ICD-10-CM | POA: Diagnosis not present

## 2016-12-28 DIAGNOSIS — J069 Acute upper respiratory infection, unspecified: Secondary | ICD-10-CM | POA: Diagnosis not present

## 2016-12-28 DIAGNOSIS — I959 Hypotension, unspecified: Secondary | ICD-10-CM | POA: Diagnosis not present

## 2016-12-28 DIAGNOSIS — I110 Hypertensive heart disease with heart failure: Secondary | ICD-10-CM | POA: Diagnosis not present

## 2016-12-28 DIAGNOSIS — I5022 Chronic systolic (congestive) heart failure: Secondary | ICD-10-CM | POA: Diagnosis not present

## 2016-12-29 DIAGNOSIS — J069 Acute upper respiratory infection, unspecified: Secondary | ICD-10-CM | POA: Diagnosis not present

## 2016-12-29 DIAGNOSIS — I959 Hypotension, unspecified: Secondary | ICD-10-CM | POA: Diagnosis not present

## 2016-12-29 DIAGNOSIS — J441 Chronic obstructive pulmonary disease with (acute) exacerbation: Secondary | ICD-10-CM | POA: Diagnosis not present

## 2016-12-29 DIAGNOSIS — I110 Hypertensive heart disease with heart failure: Secondary | ICD-10-CM | POA: Diagnosis not present

## 2016-12-29 DIAGNOSIS — I482 Chronic atrial fibrillation: Secondary | ICD-10-CM | POA: Diagnosis not present

## 2016-12-29 DIAGNOSIS — I5022 Chronic systolic (congestive) heart failure: Secondary | ICD-10-CM | POA: Diagnosis not present

## 2016-12-30 DIAGNOSIS — I959 Hypotension, unspecified: Secondary | ICD-10-CM | POA: Diagnosis not present

## 2016-12-30 DIAGNOSIS — I5022 Chronic systolic (congestive) heart failure: Secondary | ICD-10-CM | POA: Diagnosis not present

## 2016-12-30 DIAGNOSIS — I482 Chronic atrial fibrillation: Secondary | ICD-10-CM | POA: Diagnosis not present

## 2016-12-30 DIAGNOSIS — I110 Hypertensive heart disease with heart failure: Secondary | ICD-10-CM | POA: Diagnosis not present

## 2016-12-30 DIAGNOSIS — J069 Acute upper respiratory infection, unspecified: Secondary | ICD-10-CM | POA: Diagnosis not present

## 2016-12-30 DIAGNOSIS — J441 Chronic obstructive pulmonary disease with (acute) exacerbation: Secondary | ICD-10-CM | POA: Diagnosis not present

## 2016-12-31 DIAGNOSIS — R0602 Shortness of breath: Secondary | ICD-10-CM | POA: Diagnosis not present

## 2017-01-01 DIAGNOSIS — R627 Adult failure to thrive: Secondary | ICD-10-CM | POA: Diagnosis present

## 2017-01-01 DIAGNOSIS — Z7982 Long term (current) use of aspirin: Secondary | ICD-10-CM | POA: Diagnosis not present

## 2017-01-01 DIAGNOSIS — E871 Hypo-osmolality and hyponatremia: Secondary | ICD-10-CM | POA: Diagnosis not present

## 2017-01-01 DIAGNOSIS — Z7951 Long term (current) use of inhaled steroids: Secondary | ICD-10-CM | POA: Diagnosis not present

## 2017-01-01 DIAGNOSIS — I739 Peripheral vascular disease, unspecified: Secondary | ICD-10-CM | POA: Diagnosis not present

## 2017-01-01 DIAGNOSIS — I517 Cardiomegaly: Secondary | ICD-10-CM | POA: Diagnosis not present

## 2017-01-01 DIAGNOSIS — R2689 Other abnormalities of gait and mobility: Secondary | ICD-10-CM | POA: Diagnosis not present

## 2017-01-01 DIAGNOSIS — E440 Moderate protein-calorie malnutrition: Secondary | ICD-10-CM | POA: Diagnosis not present

## 2017-01-01 DIAGNOSIS — R0600 Dyspnea, unspecified: Secondary | ICD-10-CM | POA: Diagnosis not present

## 2017-01-01 DIAGNOSIS — F418 Other specified anxiety disorders: Secondary | ICD-10-CM | POA: Diagnosis not present

## 2017-01-01 DIAGNOSIS — J9610 Chronic respiratory failure, unspecified whether with hypoxia or hypercapnia: Secondary | ICD-10-CM | POA: Diagnosis present

## 2017-01-01 DIAGNOSIS — E875 Hyperkalemia: Secondary | ICD-10-CM | POA: Diagnosis present

## 2017-01-01 DIAGNOSIS — I2510 Atherosclerotic heart disease of native coronary artery without angina pectoris: Secondary | ICD-10-CM | POA: Diagnosis not present

## 2017-01-01 DIAGNOSIS — J449 Chronic obstructive pulmonary disease, unspecified: Secondary | ICD-10-CM | POA: Diagnosis not present

## 2017-01-01 DIAGNOSIS — Z9981 Dependence on supplemental oxygen: Secondary | ICD-10-CM | POA: Diagnosis not present

## 2017-01-01 DIAGNOSIS — R05 Cough: Secondary | ICD-10-CM | POA: Diagnosis not present

## 2017-01-01 DIAGNOSIS — J9600 Acute respiratory failure, unspecified whether with hypoxia or hypercapnia: Secondary | ICD-10-CM | POA: Diagnosis not present

## 2017-01-01 DIAGNOSIS — M19072 Primary osteoarthritis, left ankle and foot: Secondary | ICD-10-CM | POA: Diagnosis not present

## 2017-01-01 DIAGNOSIS — E860 Dehydration: Secondary | ICD-10-CM | POA: Diagnosis present

## 2017-01-01 DIAGNOSIS — R262 Difficulty in walking, not elsewhere classified: Secondary | ICD-10-CM | POA: Diagnosis not present

## 2017-01-01 DIAGNOSIS — R2990 Unspecified symptoms and signs involving the nervous system: Secondary | ICD-10-CM | POA: Diagnosis not present

## 2017-01-01 DIAGNOSIS — J441 Chronic obstructive pulmonary disease with (acute) exacerbation: Secondary | ICD-10-CM | POA: Diagnosis not present

## 2017-01-01 DIAGNOSIS — I428 Other cardiomyopathies: Secondary | ICD-10-CM | POA: Diagnosis present

## 2017-01-01 DIAGNOSIS — F419 Anxiety disorder, unspecified: Secondary | ICD-10-CM | POA: Insufficient documentation

## 2017-01-01 DIAGNOSIS — I5022 Chronic systolic (congestive) heart failure: Secondary | ICD-10-CM | POA: Diagnosis not present

## 2017-01-01 DIAGNOSIS — M85872 Other specified disorders of bone density and structure, left ankle and foot: Secondary | ICD-10-CM | POA: Diagnosis not present

## 2017-01-01 DIAGNOSIS — I10 Essential (primary) hypertension: Secondary | ICD-10-CM | POA: Diagnosis not present

## 2017-01-01 DIAGNOSIS — Z882 Allergy status to sulfonamides status: Secondary | ICD-10-CM | POA: Diagnosis not present

## 2017-01-01 DIAGNOSIS — F339 Major depressive disorder, recurrent, unspecified: Secondary | ICD-10-CM | POA: Diagnosis not present

## 2017-01-01 DIAGNOSIS — Z6824 Body mass index (BMI) 24.0-24.9, adult: Secondary | ICD-10-CM | POA: Diagnosis not present

## 2017-01-01 DIAGNOSIS — K5730 Diverticulosis of large intestine without perforation or abscess without bleeding: Secondary | ICD-10-CM | POA: Diagnosis not present

## 2017-01-01 DIAGNOSIS — R531 Weakness: Secondary | ICD-10-CM | POA: Diagnosis not present

## 2017-01-01 DIAGNOSIS — M6281 Muscle weakness (generalized): Secondary | ICD-10-CM | POA: Diagnosis not present

## 2017-01-01 DIAGNOSIS — Z87891 Personal history of nicotine dependence: Secondary | ICD-10-CM | POA: Diagnosis not present

## 2017-01-01 DIAGNOSIS — Z66 Do not resuscitate: Secondary | ICD-10-CM | POA: Diagnosis present

## 2017-01-01 DIAGNOSIS — R339 Retention of urine, unspecified: Secondary | ICD-10-CM | POA: Diagnosis not present

## 2017-01-01 DIAGNOSIS — I482 Chronic atrial fibrillation: Secondary | ICD-10-CM | POA: Diagnosis present

## 2017-01-01 DIAGNOSIS — I509 Heart failure, unspecified: Secondary | ICD-10-CM | POA: Diagnosis not present

## 2017-01-01 DIAGNOSIS — R5383 Other fatigue: Secondary | ICD-10-CM | POA: Diagnosis not present

## 2017-01-01 DIAGNOSIS — K5900 Constipation, unspecified: Secondary | ICD-10-CM | POA: Diagnosis not present

## 2017-01-01 DIAGNOSIS — L89892 Pressure ulcer of other site, stage 2: Secondary | ICD-10-CM | POA: Diagnosis present

## 2017-01-01 DIAGNOSIS — N179 Acute kidney failure, unspecified: Secondary | ICD-10-CM | POA: Diagnosis not present

## 2017-01-01 DIAGNOSIS — G8929 Other chronic pain: Secondary | ICD-10-CM | POA: Diagnosis present

## 2017-01-01 DIAGNOSIS — S99922A Unspecified injury of left foot, initial encounter: Secondary | ICD-10-CM | POA: Diagnosis not present

## 2017-01-01 DIAGNOSIS — R330 Drug induced retention of urine: Secondary | ICD-10-CM | POA: Diagnosis present

## 2017-01-01 DIAGNOSIS — T402X5A Adverse effect of other opioids, initial encounter: Secondary | ICD-10-CM | POA: Diagnosis present

## 2017-01-01 DIAGNOSIS — R0602 Shortness of breath: Secondary | ICD-10-CM | POA: Diagnosis not present

## 2017-01-05 DIAGNOSIS — F419 Anxiety disorder, unspecified: Secondary | ICD-10-CM | POA: Diagnosis not present

## 2017-01-05 DIAGNOSIS — J9600 Acute respiratory failure, unspecified whether with hypoxia or hypercapnia: Secondary | ICD-10-CM | POA: Diagnosis not present

## 2017-01-05 DIAGNOSIS — K5900 Constipation, unspecified: Secondary | ICD-10-CM | POA: Diagnosis not present

## 2017-01-05 DIAGNOSIS — R5383 Other fatigue: Secondary | ICD-10-CM | POA: Diagnosis not present

## 2017-01-05 DIAGNOSIS — J441 Chronic obstructive pulmonary disease with (acute) exacerbation: Secondary | ICD-10-CM | POA: Diagnosis not present

## 2017-01-05 DIAGNOSIS — I2510 Atherosclerotic heart disease of native coronary artery without angina pectoris: Secondary | ICD-10-CM | POA: Diagnosis not present

## 2017-01-05 DIAGNOSIS — R2689 Other abnormalities of gait and mobility: Secondary | ICD-10-CM | POA: Diagnosis not present

## 2017-01-05 DIAGNOSIS — M6281 Muscle weakness (generalized): Secondary | ICD-10-CM | POA: Diagnosis not present

## 2017-01-05 DIAGNOSIS — E860 Dehydration: Secondary | ICD-10-CM | POA: Diagnosis not present

## 2017-01-05 DIAGNOSIS — R2990 Unspecified symptoms and signs involving the nervous system: Secondary | ICD-10-CM | POA: Diagnosis not present

## 2017-01-05 DIAGNOSIS — F339 Major depressive disorder, recurrent, unspecified: Secondary | ICD-10-CM | POA: Diagnosis not present

## 2017-01-05 DIAGNOSIS — N179 Acute kidney failure, unspecified: Secondary | ICD-10-CM | POA: Diagnosis not present

## 2017-01-05 DIAGNOSIS — J440 Chronic obstructive pulmonary disease with acute lower respiratory infection: Secondary | ICD-10-CM | POA: Diagnosis not present

## 2017-01-05 DIAGNOSIS — I509 Heart failure, unspecified: Secondary | ICD-10-CM | POA: Diagnosis not present

## 2017-01-05 DIAGNOSIS — R0600 Dyspnea, unspecified: Secondary | ICD-10-CM | POA: Diagnosis not present

## 2017-01-05 DIAGNOSIS — R0602 Shortness of breath: Secondary | ICD-10-CM | POA: Diagnosis not present

## 2017-01-05 DIAGNOSIS — R262 Difficulty in walking, not elsewhere classified: Secondary | ICD-10-CM | POA: Diagnosis not present

## 2017-01-05 DIAGNOSIS — I10 Essential (primary) hypertension: Secondary | ICD-10-CM | POA: Diagnosis not present

## 2017-01-05 DIAGNOSIS — R531 Weakness: Secondary | ICD-10-CM | POA: Diagnosis not present

## 2017-01-05 DIAGNOSIS — M1991 Primary osteoarthritis, unspecified site: Secondary | ICD-10-CM | POA: Diagnosis not present

## 2017-01-06 DIAGNOSIS — E559 Vitamin D deficiency, unspecified: Secondary | ICD-10-CM | POA: Insufficient documentation

## 2017-01-08 DIAGNOSIS — J440 Chronic obstructive pulmonary disease with acute lower respiratory infection: Secondary | ICD-10-CM | POA: Diagnosis not present

## 2017-01-08 DIAGNOSIS — I509 Heart failure, unspecified: Secondary | ICD-10-CM | POA: Diagnosis not present

## 2017-01-08 DIAGNOSIS — M1991 Primary osteoarthritis, unspecified site: Secondary | ICD-10-CM | POA: Diagnosis not present

## 2017-01-21 DIAGNOSIS — J440 Chronic obstructive pulmonary disease with acute lower respiratory infection: Secondary | ICD-10-CM | POA: Diagnosis not present

## 2017-01-23 DIAGNOSIS — J441 Chronic obstructive pulmonary disease with (acute) exacerbation: Secondary | ICD-10-CM | POA: Diagnosis not present

## 2017-01-23 DIAGNOSIS — J069 Acute upper respiratory infection, unspecified: Secondary | ICD-10-CM | POA: Diagnosis not present

## 2017-01-23 DIAGNOSIS — I110 Hypertensive heart disease with heart failure: Secondary | ICD-10-CM | POA: Diagnosis not present

## 2017-01-23 DIAGNOSIS — I959 Hypotension, unspecified: Secondary | ICD-10-CM | POA: Diagnosis not present

## 2017-01-23 DIAGNOSIS — I482 Chronic atrial fibrillation: Secondary | ICD-10-CM | POA: Diagnosis not present

## 2017-01-23 DIAGNOSIS — I5022 Chronic systolic (congestive) heart failure: Secondary | ICD-10-CM | POA: Diagnosis not present

## 2017-01-26 DIAGNOSIS — I5022 Chronic systolic (congestive) heart failure: Secondary | ICD-10-CM | POA: Diagnosis not present

## 2017-01-26 DIAGNOSIS — I959 Hypotension, unspecified: Secondary | ICD-10-CM | POA: Diagnosis not present

## 2017-01-26 DIAGNOSIS — I110 Hypertensive heart disease with heart failure: Secondary | ICD-10-CM | POA: Diagnosis not present

## 2017-01-26 DIAGNOSIS — I482 Chronic atrial fibrillation: Secondary | ICD-10-CM | POA: Diagnosis not present

## 2017-01-26 DIAGNOSIS — J441 Chronic obstructive pulmonary disease with (acute) exacerbation: Secondary | ICD-10-CM | POA: Diagnosis not present

## 2017-01-26 DIAGNOSIS — J069 Acute upper respiratory infection, unspecified: Secondary | ICD-10-CM | POA: Diagnosis not present

## 2017-01-28 DIAGNOSIS — I110 Hypertensive heart disease with heart failure: Secondary | ICD-10-CM | POA: Diagnosis not present

## 2017-01-28 DIAGNOSIS — I5022 Chronic systolic (congestive) heart failure: Secondary | ICD-10-CM | POA: Diagnosis not present

## 2017-01-28 DIAGNOSIS — J441 Chronic obstructive pulmonary disease with (acute) exacerbation: Secondary | ICD-10-CM | POA: Diagnosis not present

## 2017-01-28 DIAGNOSIS — I959 Hypotension, unspecified: Secondary | ICD-10-CM | POA: Diagnosis not present

## 2017-01-28 DIAGNOSIS — I482 Chronic atrial fibrillation: Secondary | ICD-10-CM | POA: Diagnosis not present

## 2017-01-28 DIAGNOSIS — J069 Acute upper respiratory infection, unspecified: Secondary | ICD-10-CM | POA: Diagnosis not present

## 2017-01-30 DIAGNOSIS — I482 Chronic atrial fibrillation: Secondary | ICD-10-CM | POA: Diagnosis not present

## 2017-01-30 DIAGNOSIS — I5022 Chronic systolic (congestive) heart failure: Secondary | ICD-10-CM | POA: Diagnosis not present

## 2017-01-30 DIAGNOSIS — I110 Hypertensive heart disease with heart failure: Secondary | ICD-10-CM | POA: Diagnosis not present

## 2017-01-30 DIAGNOSIS — J069 Acute upper respiratory infection, unspecified: Secondary | ICD-10-CM | POA: Diagnosis not present

## 2017-01-30 DIAGNOSIS — I959 Hypotension, unspecified: Secondary | ICD-10-CM | POA: Diagnosis not present

## 2017-01-30 DIAGNOSIS — J441 Chronic obstructive pulmonary disease with (acute) exacerbation: Secondary | ICD-10-CM | POA: Diagnosis not present

## 2017-01-31 DIAGNOSIS — I5022 Chronic systolic (congestive) heart failure: Secondary | ICD-10-CM | POA: Diagnosis not present

## 2017-01-31 DIAGNOSIS — J069 Acute upper respiratory infection, unspecified: Secondary | ICD-10-CM | POA: Diagnosis not present

## 2017-01-31 DIAGNOSIS — I110 Hypertensive heart disease with heart failure: Secondary | ICD-10-CM | POA: Diagnosis not present

## 2017-01-31 DIAGNOSIS — I482 Chronic atrial fibrillation: Secondary | ICD-10-CM | POA: Diagnosis not present

## 2017-01-31 DIAGNOSIS — J441 Chronic obstructive pulmonary disease with (acute) exacerbation: Secondary | ICD-10-CM | POA: Diagnosis not present

## 2017-01-31 DIAGNOSIS — I959 Hypotension, unspecified: Secondary | ICD-10-CM | POA: Diagnosis not present

## 2017-02-02 DIAGNOSIS — I959 Hypotension, unspecified: Secondary | ICD-10-CM | POA: Diagnosis not present

## 2017-02-02 DIAGNOSIS — J069 Acute upper respiratory infection, unspecified: Secondary | ICD-10-CM | POA: Diagnosis not present

## 2017-02-02 DIAGNOSIS — I110 Hypertensive heart disease with heart failure: Secondary | ICD-10-CM | POA: Diagnosis not present

## 2017-02-02 DIAGNOSIS — J441 Chronic obstructive pulmonary disease with (acute) exacerbation: Secondary | ICD-10-CM | POA: Diagnosis not present

## 2017-02-02 DIAGNOSIS — I482 Chronic atrial fibrillation: Secondary | ICD-10-CM | POA: Diagnosis not present

## 2017-02-02 DIAGNOSIS — I5022 Chronic systolic (congestive) heart failure: Secondary | ICD-10-CM | POA: Diagnosis not present

## 2017-02-03 DIAGNOSIS — I5022 Chronic systolic (congestive) heart failure: Secondary | ICD-10-CM | POA: Diagnosis not present

## 2017-02-03 DIAGNOSIS — J069 Acute upper respiratory infection, unspecified: Secondary | ICD-10-CM | POA: Diagnosis not present

## 2017-02-03 DIAGNOSIS — I959 Hypotension, unspecified: Secondary | ICD-10-CM | POA: Diagnosis not present

## 2017-02-03 DIAGNOSIS — I482 Chronic atrial fibrillation: Secondary | ICD-10-CM | POA: Diagnosis not present

## 2017-02-03 DIAGNOSIS — J441 Chronic obstructive pulmonary disease with (acute) exacerbation: Secondary | ICD-10-CM | POA: Diagnosis not present

## 2017-02-03 DIAGNOSIS — I110 Hypertensive heart disease with heart failure: Secondary | ICD-10-CM | POA: Diagnosis not present

## 2017-02-05 DIAGNOSIS — M19041 Primary osteoarthritis, right hand: Secondary | ICD-10-CM | POA: Diagnosis present

## 2017-02-05 DIAGNOSIS — J9610 Chronic respiratory failure, unspecified whether with hypoxia or hypercapnia: Secondary | ICD-10-CM | POA: Diagnosis not present

## 2017-02-05 DIAGNOSIS — I5023 Acute on chronic systolic (congestive) heart failure: Secondary | ICD-10-CM | POA: Diagnosis present

## 2017-02-05 DIAGNOSIS — R609 Edema, unspecified: Secondary | ICD-10-CM | POA: Diagnosis not present

## 2017-02-05 DIAGNOSIS — M19042 Primary osteoarthritis, left hand: Secondary | ICD-10-CM | POA: Diagnosis present

## 2017-02-05 DIAGNOSIS — I509 Heart failure, unspecified: Secondary | ICD-10-CM | POA: Diagnosis not present

## 2017-02-05 DIAGNOSIS — R0600 Dyspnea, unspecified: Secondary | ICD-10-CM | POA: Diagnosis not present

## 2017-02-05 DIAGNOSIS — R918 Other nonspecific abnormal finding of lung field: Secondary | ICD-10-CM | POA: Diagnosis not present

## 2017-02-05 DIAGNOSIS — R9431 Abnormal electrocardiogram [ECG] [EKG]: Secondary | ICD-10-CM | POA: Diagnosis not present

## 2017-02-05 DIAGNOSIS — J069 Acute upper respiratory infection, unspecified: Secondary | ICD-10-CM | POA: Diagnosis not present

## 2017-02-05 DIAGNOSIS — Z6825 Body mass index (BMI) 25.0-25.9, adult: Secondary | ICD-10-CM | POA: Diagnosis not present

## 2017-02-05 DIAGNOSIS — R0602 Shortness of breath: Secondary | ICD-10-CM | POA: Diagnosis not present

## 2017-02-05 DIAGNOSIS — I5022 Chronic systolic (congestive) heart failure: Secondary | ICD-10-CM | POA: Diagnosis not present

## 2017-02-05 DIAGNOSIS — R339 Retention of urine, unspecified: Secondary | ICD-10-CM | POA: Diagnosis present

## 2017-02-05 DIAGNOSIS — R197 Diarrhea, unspecified: Secondary | ICD-10-CM | POA: Diagnosis present

## 2017-02-05 DIAGNOSIS — R627 Adult failure to thrive: Secondary | ICD-10-CM | POA: Diagnosis present

## 2017-02-05 DIAGNOSIS — I110 Hypertensive heart disease with heart failure: Secondary | ICD-10-CM | POA: Diagnosis not present

## 2017-02-05 DIAGNOSIS — I959 Hypotension, unspecified: Secondary | ICD-10-CM | POA: Diagnosis not present

## 2017-02-05 DIAGNOSIS — R710 Precipitous drop in hematocrit: Secondary | ICD-10-CM | POA: Diagnosis not present

## 2017-02-05 DIAGNOSIS — I739 Peripheral vascular disease, unspecified: Secondary | ICD-10-CM | POA: Diagnosis present

## 2017-02-05 DIAGNOSIS — I428 Other cardiomyopathies: Secondary | ICD-10-CM | POA: Diagnosis not present

## 2017-02-05 DIAGNOSIS — R0609 Other forms of dyspnea: Secondary | ICD-10-CM | POA: Diagnosis not present

## 2017-02-05 DIAGNOSIS — J441 Chronic obstructive pulmonary disease with (acute) exacerbation: Secondary | ICD-10-CM | POA: Diagnosis not present

## 2017-02-05 DIAGNOSIS — F0390 Unspecified dementia without behavioral disturbance: Secondary | ICD-10-CM | POA: Diagnosis present

## 2017-02-05 DIAGNOSIS — R110 Nausea: Secondary | ICD-10-CM | POA: Diagnosis present

## 2017-02-05 DIAGNOSIS — R05 Cough: Secondary | ICD-10-CM | POA: Diagnosis not present

## 2017-02-05 DIAGNOSIS — Z87891 Personal history of nicotine dependence: Secondary | ICD-10-CM | POA: Diagnosis not present

## 2017-02-05 DIAGNOSIS — E559 Vitamin D deficiency, unspecified: Secondary | ICD-10-CM | POA: Diagnosis present

## 2017-02-05 DIAGNOSIS — J811 Chronic pulmonary edema: Secondary | ICD-10-CM | POA: Diagnosis not present

## 2017-02-05 DIAGNOSIS — F329 Major depressive disorder, single episode, unspecified: Secondary | ICD-10-CM | POA: Diagnosis present

## 2017-02-05 DIAGNOSIS — Z882 Allergy status to sulfonamides status: Secondary | ICD-10-CM | POA: Diagnosis not present

## 2017-02-05 DIAGNOSIS — R269 Unspecified abnormalities of gait and mobility: Secondary | ICD-10-CM | POA: Diagnosis present

## 2017-02-05 DIAGNOSIS — I2510 Atherosclerotic heart disease of native coronary artery without angina pectoris: Secondary | ICD-10-CM | POA: Diagnosis present

## 2017-02-05 DIAGNOSIS — Z66 Do not resuscitate: Secondary | ICD-10-CM | POA: Diagnosis present

## 2017-02-05 DIAGNOSIS — G8929 Other chronic pain: Secondary | ICD-10-CM | POA: Diagnosis not present

## 2017-02-05 DIAGNOSIS — I482 Chronic atrial fibrillation: Secondary | ICD-10-CM | POA: Diagnosis present

## 2017-02-05 DIAGNOSIS — J449 Chronic obstructive pulmonary disease, unspecified: Secondary | ICD-10-CM | POA: Diagnosis present

## 2017-02-05 DIAGNOSIS — Z9981 Dependence on supplemental oxygen: Secondary | ICD-10-CM | POA: Diagnosis not present

## 2017-02-05 DIAGNOSIS — Z96651 Presence of right artificial knee joint: Secondary | ICD-10-CM | POA: Diagnosis present

## 2017-02-05 DIAGNOSIS — R079 Chest pain, unspecified: Secondary | ICD-10-CM | POA: Diagnosis not present

## 2017-02-10 DIAGNOSIS — I959 Hypotension, unspecified: Secondary | ICD-10-CM | POA: Diagnosis not present

## 2017-02-10 DIAGNOSIS — I482 Chronic atrial fibrillation: Secondary | ICD-10-CM | POA: Diagnosis not present

## 2017-02-10 DIAGNOSIS — J441 Chronic obstructive pulmonary disease with (acute) exacerbation: Secondary | ICD-10-CM | POA: Diagnosis not present

## 2017-02-10 DIAGNOSIS — J069 Acute upper respiratory infection, unspecified: Secondary | ICD-10-CM | POA: Diagnosis not present

## 2017-02-10 DIAGNOSIS — I5022 Chronic systolic (congestive) heart failure: Secondary | ICD-10-CM | POA: Diagnosis not present

## 2017-02-10 DIAGNOSIS — I110 Hypertensive heart disease with heart failure: Secondary | ICD-10-CM | POA: Diagnosis not present

## 2017-02-15 DIAGNOSIS — Z720 Tobacco use: Secondary | ICD-10-CM | POA: Diagnosis not present

## 2017-02-15 DIAGNOSIS — I482 Chronic atrial fibrillation: Secondary | ICD-10-CM | POA: Diagnosis not present

## 2017-02-15 DIAGNOSIS — I8393 Asymptomatic varicose veins of bilateral lower extremities: Secondary | ICD-10-CM | POA: Diagnosis not present

## 2017-02-15 DIAGNOSIS — R197 Diarrhea, unspecified: Secondary | ICD-10-CM | POA: Diagnosis not present

## 2017-02-15 DIAGNOSIS — D649 Anemia, unspecified: Secondary | ICD-10-CM | POA: Diagnosis not present

## 2017-02-15 DIAGNOSIS — I5022 Chronic systolic (congestive) heart failure: Secondary | ICD-10-CM | POA: Diagnosis not present

## 2017-02-15 DIAGNOSIS — F320 Major depressive disorder, single episode, mild: Secondary | ICD-10-CM | POA: Diagnosis not present

## 2017-02-15 DIAGNOSIS — F329 Major depressive disorder, single episode, unspecified: Secondary | ICD-10-CM | POA: Diagnosis not present

## 2017-02-15 DIAGNOSIS — F419 Anxiety disorder, unspecified: Secondary | ICD-10-CM | POA: Diagnosis not present

## 2017-02-15 DIAGNOSIS — R0600 Dyspnea, unspecified: Secondary | ICD-10-CM | POA: Diagnosis not present

## 2017-02-15 DIAGNOSIS — M79676 Pain in unspecified toe(s): Secondary | ICD-10-CM | POA: Diagnosis not present

## 2017-02-15 DIAGNOSIS — F17200 Nicotine dependence, unspecified, uncomplicated: Secondary | ICD-10-CM | POA: Diagnosis not present

## 2017-02-15 DIAGNOSIS — Z6825 Body mass index (BMI) 25.0-25.9, adult: Secondary | ICD-10-CM | POA: Diagnosis not present

## 2017-02-15 DIAGNOSIS — G8929 Other chronic pain: Secondary | ICD-10-CM | POA: Diagnosis not present

## 2017-02-15 DIAGNOSIS — R944 Abnormal results of kidney function studies: Secondary | ICD-10-CM | POA: Diagnosis not present

## 2017-02-15 DIAGNOSIS — Z09 Encounter for follow-up examination after completed treatment for conditions other than malignant neoplasm: Secondary | ICD-10-CM | POA: Diagnosis not present

## 2017-02-15 DIAGNOSIS — E559 Vitamin D deficiency, unspecified: Secondary | ICD-10-CM | POA: Diagnosis not present

## 2017-02-15 DIAGNOSIS — J449 Chronic obstructive pulmonary disease, unspecified: Secondary | ICD-10-CM | POA: Diagnosis not present

## 2017-02-16 DIAGNOSIS — I110 Hypertensive heart disease with heart failure: Secondary | ICD-10-CM | POA: Diagnosis not present

## 2017-02-16 DIAGNOSIS — I959 Hypotension, unspecified: Secondary | ICD-10-CM | POA: Diagnosis not present

## 2017-02-16 DIAGNOSIS — I482 Chronic atrial fibrillation: Secondary | ICD-10-CM | POA: Diagnosis not present

## 2017-02-16 DIAGNOSIS — J069 Acute upper respiratory infection, unspecified: Secondary | ICD-10-CM | POA: Diagnosis not present

## 2017-02-16 DIAGNOSIS — J441 Chronic obstructive pulmonary disease with (acute) exacerbation: Secondary | ICD-10-CM | POA: Diagnosis not present

## 2017-02-16 DIAGNOSIS — I5022 Chronic systolic (congestive) heart failure: Secondary | ICD-10-CM | POA: Diagnosis not present

## 2017-02-18 DIAGNOSIS — I959 Hypotension, unspecified: Secondary | ICD-10-CM | POA: Diagnosis not present

## 2017-02-18 DIAGNOSIS — I5022 Chronic systolic (congestive) heart failure: Secondary | ICD-10-CM | POA: Diagnosis not present

## 2017-02-18 DIAGNOSIS — I482 Chronic atrial fibrillation: Secondary | ICD-10-CM | POA: Diagnosis not present

## 2017-02-18 DIAGNOSIS — J441 Chronic obstructive pulmonary disease with (acute) exacerbation: Secondary | ICD-10-CM | POA: Diagnosis not present

## 2017-02-18 DIAGNOSIS — J069 Acute upper respiratory infection, unspecified: Secondary | ICD-10-CM | POA: Diagnosis not present

## 2017-02-18 DIAGNOSIS — I110 Hypertensive heart disease with heart failure: Secondary | ICD-10-CM | POA: Diagnosis not present

## 2017-02-19 DIAGNOSIS — I5022 Chronic systolic (congestive) heart failure: Secondary | ICD-10-CM | POA: Diagnosis not present

## 2017-02-19 DIAGNOSIS — Z6826 Body mass index (BMI) 26.0-26.9, adult: Secondary | ICD-10-CM | POA: Diagnosis not present

## 2017-02-26 DIAGNOSIS — H903 Sensorineural hearing loss, bilateral: Secondary | ICD-10-CM | POA: Diagnosis not present

## 2017-03-08 DIAGNOSIS — G894 Chronic pain syndrome: Secondary | ICD-10-CM | POA: Diagnosis not present

## 2017-03-08 DIAGNOSIS — I5022 Chronic systolic (congestive) heart failure: Secondary | ICD-10-CM | POA: Diagnosis not present

## 2017-03-08 DIAGNOSIS — J449 Chronic obstructive pulmonary disease, unspecified: Secondary | ICD-10-CM | POA: Diagnosis not present

## 2017-03-08 DIAGNOSIS — Z515 Encounter for palliative care: Secondary | ICD-10-CM | POA: Diagnosis not present

## 2017-03-10 DIAGNOSIS — Z515 Encounter for palliative care: Secondary | ICD-10-CM | POA: Insufficient documentation

## 2017-03-13 ENCOUNTER — Emergency Department: Payer: Medicare Other

## 2017-03-13 ENCOUNTER — Inpatient Hospital Stay
Admission: EM | Admit: 2017-03-13 | Discharge: 2017-03-18 | DRG: 871 | Disposition: A | Discharge status: Home Health | Payer: Medicare Other | Attending: Internal Medicine | Admitting: Internal Medicine

## 2017-03-13 DIAGNOSIS — J441 Chronic obstructive pulmonary disease with (acute) exacerbation: Secondary | ICD-10-CM | POA: Diagnosis not present

## 2017-03-13 DIAGNOSIS — Z7982 Long term (current) use of aspirin: Secondary | ICD-10-CM

## 2017-03-13 DIAGNOSIS — I340 Nonrheumatic mitral (valve) insufficiency: Secondary | ICD-10-CM | POA: Diagnosis not present

## 2017-03-13 DIAGNOSIS — R6521 Severe sepsis with septic shock: Secondary | ICD-10-CM | POA: Diagnosis present

## 2017-03-13 DIAGNOSIS — E872 Acidosis, unspecified: Secondary | ICD-10-CM

## 2017-03-13 DIAGNOSIS — I248 Other forms of acute ischemic heart disease: Secondary | ICD-10-CM | POA: Diagnosis present

## 2017-03-13 DIAGNOSIS — I110 Hypertensive heart disease with heart failure: Secondary | ICD-10-CM | POA: Diagnosis present

## 2017-03-13 DIAGNOSIS — I5043 Acute on chronic combined systolic (congestive) and diastolic (congestive) heart failure: Secondary | ICD-10-CM | POA: Diagnosis present

## 2017-03-13 DIAGNOSIS — J9621 Acute and chronic respiratory failure with hypoxia: Secondary | ICD-10-CM | POA: Diagnosis not present

## 2017-03-13 DIAGNOSIS — Z22322 Carrier or suspected carrier of Methicillin resistant Staphylococcus aureus: Secondary | ICD-10-CM

## 2017-03-13 DIAGNOSIS — I5022 Chronic systolic (congestive) heart failure: Secondary | ICD-10-CM | POA: Diagnosis not present

## 2017-03-13 DIAGNOSIS — Z9981 Dependence on supplemental oxygen: Secondary | ICD-10-CM

## 2017-03-13 DIAGNOSIS — E876 Hypokalemia: Secondary | ICD-10-CM | POA: Diagnosis present

## 2017-03-13 DIAGNOSIS — I429 Cardiomyopathy, unspecified: Secondary | ICD-10-CM | POA: Diagnosis present

## 2017-03-13 DIAGNOSIS — J440 Chronic obstructive pulmonary disease with acute lower respiratory infection: Secondary | ICD-10-CM | POA: Diagnosis present

## 2017-03-13 DIAGNOSIS — R0600 Dyspnea, unspecified: Secondary | ICD-10-CM | POA: Diagnosis not present

## 2017-03-13 DIAGNOSIS — Z87891 Personal history of nicotine dependence: Secondary | ICD-10-CM

## 2017-03-13 DIAGNOSIS — A419 Sepsis, unspecified organism: Secondary | ICD-10-CM | POA: Diagnosis present

## 2017-03-13 DIAGNOSIS — Z96659 Presence of unspecified artificial knee joint: Secondary | ICD-10-CM | POA: Diagnosis present

## 2017-03-13 DIAGNOSIS — J209 Acute bronchitis, unspecified: Secondary | ICD-10-CM | POA: Diagnosis not present

## 2017-03-13 DIAGNOSIS — I472 Ventricular tachycardia: Secondary | ICD-10-CM | POA: Diagnosis not present

## 2017-03-13 DIAGNOSIS — Z882 Allergy status to sulfonamides status: Secondary | ICD-10-CM | POA: Diagnosis not present

## 2017-03-13 DIAGNOSIS — I493 Ventricular premature depolarization: Secondary | ICD-10-CM | POA: Diagnosis not present

## 2017-03-13 DIAGNOSIS — R059 Cough, unspecified: Secondary | ICD-10-CM

## 2017-03-13 DIAGNOSIS — I2510 Atherosclerotic heart disease of native coronary artery without angina pectoris: Secondary | ICD-10-CM | POA: Diagnosis present

## 2017-03-13 DIAGNOSIS — R05 Cough: Secondary | ICD-10-CM | POA: Diagnosis not present

## 2017-03-13 DIAGNOSIS — N390 Urinary tract infection, site not specified: Secondary | ICD-10-CM | POA: Diagnosis present

## 2017-03-13 DIAGNOSIS — B9620 Unspecified Escherichia coli [E. coli] as the cause of diseases classified elsewhere: Secondary | ICD-10-CM | POA: Diagnosis present

## 2017-03-13 DIAGNOSIS — R911 Solitary pulmonary nodule: Secondary | ICD-10-CM | POA: Diagnosis present

## 2017-03-13 DIAGNOSIS — R739 Hyperglycemia, unspecified: Secondary | ICD-10-CM | POA: Diagnosis present

## 2017-03-13 DIAGNOSIS — I4891 Unspecified atrial fibrillation: Secondary | ICD-10-CM | POA: Diagnosis not present

## 2017-03-13 DIAGNOSIS — D649 Anemia, unspecified: Secondary | ICD-10-CM | POA: Diagnosis present

## 2017-03-13 DIAGNOSIS — R8281 Pyuria: Secondary | ICD-10-CM

## 2017-03-13 DIAGNOSIS — I5023 Acute on chronic systolic (congestive) heart failure: Secondary | ICD-10-CM | POA: Diagnosis not present

## 2017-03-13 DIAGNOSIS — I959 Hypotension, unspecified: Secondary | ICD-10-CM

## 2017-03-13 DIAGNOSIS — I255 Ischemic cardiomyopathy: Secondary | ICD-10-CM | POA: Diagnosis not present

## 2017-03-13 DIAGNOSIS — Z7951 Long term (current) use of inhaled steroids: Secondary | ICD-10-CM | POA: Diagnosis not present

## 2017-03-13 DIAGNOSIS — J9601 Acute respiratory failure with hypoxia: Secondary | ICD-10-CM | POA: Diagnosis not present

## 2017-03-13 DIAGNOSIS — J9602 Acute respiratory failure with hypercapnia: Secondary | ICD-10-CM | POA: Diagnosis not present

## 2017-03-13 DIAGNOSIS — Z8679 Personal history of other diseases of the circulatory system: Secondary | ICD-10-CM

## 2017-03-13 DIAGNOSIS — R0602 Shortness of breath: Secondary | ICD-10-CM | POA: Diagnosis not present

## 2017-03-13 LAB — BLOOD GAS, VENOUS
Acid-base deficit: 0.6 mmol/L (ref 0.0–2.0)
Bicarbonate: 28.6 mmol/L — ABNORMAL HIGH (ref 20.0–28.0)
DELIVERY SYSTEMS: POSITIVE
FIO2: 0.6
LHR: 8 {breaths}/min
O2 Saturation: 80.3 %
PATIENT TEMPERATURE: 37
PO2 VEN: 54 mmHg — AB (ref 32.0–45.0)
pCO2, Ven: 70 mmHg — ABNORMAL HIGH (ref 44.0–60.0)
pH, Ven: 7.22 — ABNORMAL LOW (ref 7.250–7.430)

## 2017-03-13 LAB — URINALYSIS, COMPLETE (UACMP) WITH MICROSCOPIC
BILIRUBIN URINE: NEGATIVE
Bacteria, UA: NONE SEEN
GLUCOSE, UA: NEGATIVE mg/dL
KETONES UR: NEGATIVE mg/dL
NITRITE: NEGATIVE
PROTEIN: NEGATIVE mg/dL
Specific Gravity, Urine: 1.005 (ref 1.005–1.030)
pH: 6 (ref 5.0–8.0)

## 2017-03-13 LAB — COMPREHENSIVE METABOLIC PANEL
ALT: 10 U/L — AB (ref 14–54)
ANION GAP: 13 (ref 5–15)
AST: 23 U/L (ref 15–41)
Albumin: 3.1 g/dL — ABNORMAL LOW (ref 3.5–5.0)
Alkaline Phosphatase: 59 U/L (ref 38–126)
BUN: 16 mg/dL (ref 6–20)
CHLORIDE: 105 mmol/L (ref 101–111)
CO2: 25 mmol/L (ref 22–32)
CREATININE: 0.97 mg/dL (ref 0.44–1.00)
Calcium: 8.6 mg/dL — ABNORMAL LOW (ref 8.9–10.3)
GFR, EST NON AFRICAN AMERICAN: 57 mL/min — AB (ref >60)
Glucose, Bld: 268 mg/dL — ABNORMAL HIGH (ref 65–99)
POTASSIUM: 4.1 mmol/L (ref 3.5–5.1)
SODIUM: 143 mmol/L (ref 135–145)
Total Bilirubin: 0.7 mg/dL (ref 0.3–1.2)
Total Protein: 6.2 g/dL — ABNORMAL LOW (ref 6.5–8.1)

## 2017-03-13 LAB — CREATININE, SERUM
CREATININE: 0.89 mg/dL (ref 0.44–1.00)
GFR calc Af Amer: >60 mL/min (ref >60)

## 2017-03-13 LAB — CBC WITH DIFFERENTIAL/PLATELET
Basophils Absolute: 0 10*3/uL (ref 0–0.1)
Basophils Relative: 0 %
EOS PCT: 2 %
Eosinophils Absolute: 0.2 10*3/uL (ref 0–0.7)
HCT: 36.8 % (ref 35.0–47.0)
Hemoglobin: 12.1 g/dL (ref 12.0–16.0)
LYMPHS ABS: 5.4 10*3/uL — AB (ref 1.0–3.6)
Lymphocytes Relative: 41 %
MCH: 29.6 pg (ref 26.0–34.0)
MCHC: 32.8 g/dL (ref 32.0–36.0)
MCV: 90.4 fL (ref 80.0–100.0)
Monocytes Absolute: 0.6 10*3/uL (ref 0.2–0.9)
Monocytes Relative: 4 %
NEUTROS PCT: 53 %
Neutro Abs: 7 10*3/uL — ABNORMAL HIGH (ref 1.4–6.5)
PLATELETS: 274 10*3/uL (ref 150–440)
RBC: 4.07 MIL/uL (ref 3.80–5.20)
RDW: 16.7 % — ABNORMAL HIGH (ref 11.5–14.5)
WBC: 13.2 10*3/uL — ABNORMAL HIGH (ref 3.6–11.0)

## 2017-03-13 LAB — MRSA PCR SCREENING: MRSA BY PCR: POSITIVE — AB

## 2017-03-13 LAB — CBC
HCT: 35.2 % (ref 35.0–47.0)
HEMOGLOBIN: 11.6 g/dL — AB (ref 12.0–16.0)
MCH: 29.7 pg (ref 26.0–34.0)
MCHC: 33 g/dL (ref 32.0–36.0)
MCV: 90 fL (ref 80.0–100.0)
PLATELETS: 182 10*3/uL (ref 150–440)
RBC: 3.91 MIL/uL (ref 3.80–5.20)
RDW: 16.2 % — ABNORMAL HIGH (ref 11.5–14.5)
WBC: 9.7 10*3/uL (ref 3.6–11.0)

## 2017-03-13 LAB — LACTIC ACID, PLASMA
LACTIC ACID, VENOUS: 5.7 mmol/L — AB (ref 0.5–1.9)
LACTIC ACID, VENOUS: 2.1 mmol/L — AB (ref 0.5–1.9)

## 2017-03-13 LAB — BRAIN NATRIURETIC PEPTIDE: B NATRIURETIC PEPTIDE 5: 2874 pg/mL — AB (ref 0.0–100.0)

## 2017-03-13 LAB — TROPONIN I: TROPONIN I: 0.03 ng/mL — AB (ref <0.03)

## 2017-03-13 LAB — PROTIME-INR
INR: 1.05
Prothrombin Time: 13.7 seconds (ref 11.4–15.2)

## 2017-03-13 MED ORDER — ONDANSETRON HCL 4 MG/2ML IJ SOLN: 4.0000 mg | Freq: Four times a day (QID) | INTRAMUSCULAR | Status: DC | PRN

## 2017-03-13 MED ORDER — ACETAMINOPHEN 325 MG PO TABS
650.0000 mg | ORAL_TABLET | Freq: Four times a day (QID) | ORAL | Status: DC | PRN
  Administered 2017-03-13 – 2017-03-15 (×3): 650 mg via ORAL
  Filled 2017-03-13 (×3): qty 2

## 2017-03-13 MED ORDER — SODIUM CHLORIDE 0.9 % IV BOLUS (SEPSIS)
1000.0000 mL | Freq: Once | INTRAVENOUS | Status: AC
  Administered 2017-03-13: 1000 mL via INTRAVENOUS

## 2017-03-13 MED ORDER — ENOXAPARIN SODIUM 40 MG/0.4ML ~~LOC~~ SOLN
40.0000 mg | SUBCUTANEOUS | Status: DC
  Administered 2017-03-13 – 2017-03-17 (×5): 40 mg via SUBCUTANEOUS
  Filled 2017-03-13 (×5): qty 0.4

## 2017-03-13 MED ORDER — SODIUM CHLORIDE 0.9 % IV SOLN
0.0000 ug/min | INTRAVENOUS | Status: DC
  Filled 2017-03-13: qty 1

## 2017-03-13 MED ORDER — ACETAMINOPHEN 650 MG RE SUPP: 650.0000 mg | Freq: Four times a day (QID) | RECTAL | Status: DC | PRN

## 2017-03-13 MED ORDER — DEXTROSE 5 % IV SOLN
500.0000 mg | Freq: Once | INTRAVENOUS | Status: AC
  Administered 2017-03-13: 500 mg via INTRAVENOUS
  Filled 2017-03-13: qty 500

## 2017-03-13 MED ORDER — IPRATROPIUM-ALBUTEROL 0.5-2.5 (3) MG/3ML IN SOLN
RESPIRATORY_TRACT | Status: AC
  Administered 2017-03-13: 6 mL
  Filled 2017-03-13: qty 6

## 2017-03-13 MED ORDER — DEXTROSE 5 % IV SOLN
500.0000 mg | INTRAVENOUS | Status: DC
  Administered 2017-03-14 – 2017-03-16 (×3): 500 mg via INTRAVENOUS
  Filled 2017-03-13 (×4): qty 500

## 2017-03-13 MED ORDER — ONDANSETRON HCL 4 MG PO TABS: 4.0000 mg | ORAL_TABLET | Freq: Four times a day (QID) | ORAL | Status: DC | PRN

## 2017-03-13 MED ORDER — ORAL CARE MOUTH RINSE
15.0000 mL | Freq: Two times a day (BID) | OROMUCOSAL | Status: DC
  Administered 2017-03-14 – 2017-03-18 (×6): 15 mL via OROMUCOSAL

## 2017-03-13 MED ORDER — DULOXETINE HCL 30 MG PO CPEP
30.0000 mg | ORAL_CAPSULE | Freq: Every day | ORAL | Status: DC
  Administered 2017-03-14 – 2017-03-18 (×5): 30 mg via ORAL
  Filled 2017-03-13 (×5): qty 1

## 2017-03-13 MED ORDER — PIPERACILLIN-TAZOBACTAM 3.375 G IVPB
3.3750 g | Freq: Three times a day (TID) | INTRAVENOUS | Status: DC
  Administered 2017-03-13 – 2017-03-15 (×6): 3.375 g via INTRAVENOUS
  Filled 2017-03-13 (×6): qty 50

## 2017-03-13 MED ORDER — KETOROLAC TROMETHAMINE 15 MG/ML IJ SOLN
15.0000 mg | Freq: Once | INTRAMUSCULAR | Status: AC
  Administered 2017-03-13: 15 mg via INTRAVENOUS
  Filled 2017-03-13: qty 1

## 2017-03-13 MED ORDER — CHLORHEXIDINE GLUCONATE CLOTH 2 % EX PADS
6.0000 | MEDICATED_PAD | Freq: Every day | CUTANEOUS | Status: DC
  Administered 2017-03-16 – 2017-03-18 (×4): 6 via TOPICAL

## 2017-03-13 MED ORDER — ALBUTEROL SULFATE (2.5 MG/3ML) 0.083% IN NEBU: 5.0000 mg | INHALATION_SOLUTION | Freq: Once | RESPIRATORY_TRACT | Status: DC

## 2017-03-13 MED ORDER — NITROGLYCERIN 0.4 MG SL SUBL
SUBLINGUAL_TABLET | SUBLINGUAL | Status: AC
  Administered 2017-03-13: 0.4 mg
  Filled 2017-03-13: qty 1

## 2017-03-13 MED ORDER — ASPIRIN 81 MG PO CHEW
81.0000 mg | CHEWABLE_TABLET | Freq: Every day | ORAL | Status: DC
  Administered 2017-03-14 – 2017-03-18 (×5): 81 mg via ORAL
  Filled 2017-03-13 (×5): qty 1

## 2017-03-13 MED ORDER — ENOXAPARIN SODIUM 30 MG/0.3ML ~~LOC~~ SOLN: 30.0000 mg | SUBCUTANEOUS | Status: DC

## 2017-03-13 MED ORDER — METHYLPREDNISOLONE SODIUM SUCC 125 MG IJ SOLR
125.0000 mg | Freq: Two times a day (BID) | INTRAMUSCULAR | Status: DC
  Administered 2017-03-13: 125 mg via INTRAVENOUS
  Filled 2017-03-13: qty 2

## 2017-03-13 MED ORDER — DEXTROSE 5 % IV SOLN
1.0000 g | Freq: Once | INTRAVENOUS | Status: AC
  Administered 2017-03-13: 1 g via INTRAVENOUS
  Filled 2017-03-13: qty 10

## 2017-03-13 MED ORDER — MUPIROCIN 2 % EX OINT
1.0000 "application " | TOPICAL_OINTMENT | Freq: Two times a day (BID) | CUTANEOUS | Status: AC
  Administered 2017-03-13 – 2017-03-17 (×10): 1 via NASAL
  Filled 2017-03-13: qty 22

## 2017-03-13 MED ORDER — LACTATED RINGERS IV SOLN
INTRAVENOUS | Status: DC
  Administered 2017-03-13 – 2017-03-14 (×2): via INTRAVENOUS

## 2017-03-13 MED ORDER — IPRATROPIUM-ALBUTEROL 0.5-2.5 (3) MG/3ML IN SOLN
3.0000 mL | Freq: Four times a day (QID) | RESPIRATORY_TRACT | Status: DC
  Administered 2017-03-13 – 2017-03-16 (×12): 3 mL via RESPIRATORY_TRACT
  Filled 2017-03-13 (×12): qty 3

## 2017-03-13 MED ORDER — CITALOPRAM HYDROBROMIDE 20 MG PO TABS
20.0000 mg | ORAL_TABLET | Freq: Every day | ORAL | Status: DC
  Administered 2017-03-14 – 2017-03-18 (×5): 20 mg via ORAL
  Filled 2017-03-13 (×5): qty 1

## 2017-03-13 MED ORDER — PANTOPRAZOLE SODIUM 40 MG PO TBEC
40.0000 mg | DELAYED_RELEASE_TABLET | Freq: Every day | ORAL | Status: DC
  Administered 2017-03-13 – 2017-03-18 (×6): 40 mg via ORAL
  Filled 2017-03-13 (×6): qty 1

## 2017-03-13 MED ORDER — METHYLPREDNISOLONE SODIUM SUCC 125 MG IJ SOLR
125.0000 mg | Freq: Once | INTRAMUSCULAR | Status: AC
  Administered 2017-03-13: 125 mg via INTRAVENOUS
  Filled 2017-03-13: qty 2

## 2017-03-13 NOTE — Progress Notes (Signed)
ANTIBIOTIC CONSULT NOTE - INITIAL  Pharmacy Consult for Azithromycin and Zosyn Indication: pneumonia  Allergies  Allergen Reactions  . Sulfa Antibiotics Itching    Patient Measurements: Height: 5\' 2"  (157.5 cm) Weight: 158 lb 8.2 oz (71.9 kg) IBW/kg (Calculated) : 50.1   Vital Signs: Temp: 97.2 F (36.2 C) (08/18 1207) Temp Source: Axillary (08/18 1207) BP: 100/69 (08/18 1215) Pulse Rate: 91 (08/18 1215) Intake/Output from previous day: No intake/output data recorded. Intake/Output from this shift: Total I/O In: 2250 [IV Piggyback:2250] Out: -   Labs:  Recent Labs  03/13/17 0841  WBC 13.2*  HGB 12.1  PLT 274  CREATININE 0.97   Estimated Creatinine Clearance: 47.9 mL/min (by C-G formula based on SCr of 0.97 mg/dL). No results for input(s): VANCOTROUGH, VANCOPEAK, VANCORANDOM, GENTTROUGH, GENTPEAK, GENTRANDOM, TOBRATROUGH, TOBRAPEAK, TOBRARND, AMIKACINPEAK, AMIKACINTROU, AMIKACIN in the last 72 hours.   Microbiology: No results found for this or any previous visit (from the past 720 hour(s)).  Medical History: Past Medical History:  Diagnosis Date  . CHF (congestive heart failure) (HCC)   . COPD (chronic obstructive pulmonary disease) (HCC)   . Hypertension   . Lung nodule     Assessment: 8/18 Patient is being treated for possible pneumonia/bacterial bronchitis. Patient received one dose of azithromycin and ceftriaxone in the ED. Pharmacy was consulted to dose azithromycin and zosyn.   BCx 8/18>> UCx 8/18>> MRSA PCR 8/18>>   Plan:  8/18 Will continue azithromycin and begin zosyn. Pharmacy will continue to follow and adjust as needed.   Yolanda Bonine, PharmD Pharmacy Resident  03/13/2017,12:49 PM

## 2017-03-13 NOTE — Consult Note (Signed)
PULMONARY / CRITICAL CARE MEDICINE   Name: [name] [name] MRN: [id number] DOB: 1946/04/07    ADMISSION DATE:  03/13/2017   CONSULTATION DATE:  03/13/17  REFERRING MD:  Dr Letitia Libra  CHIEF COMPLAINT: Acute dyspnea    HISTORY OF PRESENT ILLNESS:   This is a 71 y/o female with a PMH as indicated below who presented to the Ed via EMS with complaints of acute dyspnea. Patient states that symptoms started a few days ago and progressively got worse.  Upon EMS' arrival,patient was tachypneic, tachycardic and hypoxic. She was placed on a non-rebreather mask and transported to the ED. At the ED, she remained hypoxic and tachypneic hence she was placed on BiPAP. Her CXR showed mild pulmonary congestion and labs showed an elevated BNP level and mild leukocytosis. She reports significant improvement in symptoms. She is now off BiPAP. Also reports a headache that has not improved with tylenol. Denies photophobic,  nausea, vomiting, chest pain and palpitations.  PAST MEDICAL HISTORY :  She  has a past medical history of CHF (congestive heart failure) (HCC); COPD (chronic obstructive pulmonary disease) (HCC); Hypertension; and Lung nodule.  PAST SURGICAL HISTORY: She  has a past surgical history that includes Replacement total knee.  Allergies  Allergen Reactions  . Sulfa Antibiotics Itching    No current facility-administered medications on file prior to encounter.    Current Outpatient Prescriptions on File Prior to Encounter  Medication Sig  . aspirin 81 MG chewable tablet Chew 81 mg by mouth daily.  Marland Kitchen atorvastatin (LIPITOR) 40 MG tablet Take 40 mg by mouth daily at 6 PM.  . citalopram (CELEXA) 20 MG tablet Take 20 mg by mouth daily.  . fluticasone (FLONASE) 50 MCG/ACT nasal spray Place 2 sprays into both nostrils daily.  Marland Kitchen omeprazole (PRILOSEC) 20 MG capsule Take 20 mg by mouth daily.  Marland Kitchen albuterol (PROVENTIL HFA;VENTOLIN HFA) 108 (90 Base) MCG/ACT inhaler Inhale 1 puff into the lungs every 6  (six) hours as needed for wheezing or shortness of breath.  Marland Kitchen alum & mag hydroxide-simeth (MAALOX/MYLANTA) 200-200-20 MG/5ML suspension Take 5 mLs by mouth every 8 (eight) hours as needed for indigestion or heartburn.  . carvedilol (COREG) 6.25 MG tablet Take 1 tablet (6.25 mg total) by mouth 2 (two) times daily with a meal. (Patient not taking: Reported on 03/13/2017)  . diphenhydrAMINE (BENADRYL) 25 mg capsule Take 25 mg by mouth every 6 (six) hours as needed.  . sacubitril-valsartan (ENTRESTO) 24-26 MG Take 1 tablet by mouth 2 (two) times daily. (Patient not taking: Reported on 03/13/2017)  . spironolactone (ALDACTONE) 25 MG tablet Take 1 tablet (25 mg total) by mouth daily. (Patient not taking: Reported on 03/13/2017)    FAMILY HISTORY:  Her has no family status information on file.    SOCIAL HISTORY: She  reports that she has quit smoking. She has never used smokeless tobacco. She reports that she does not drink alcohol.  REVIEW OF SYSTEMS:   Constitutional: Negative for fever and chills.  HENT: Negative for congestion and rhinorrhea.  Eyes: Negative for redness and visual disturbance.  Respiratory: Positive for shortness of breath and wheezing.  Cardiovascular: Negative for chest pain and palpitations.  Gastrointestinal: Negative  for nausea , vomiting and abdominal pain and  Loose stools Genitourinary: Negative for dysuria and urgency.  Endocrine: Denies polyuria, polyphagia and heat intolerance Musculoskeletal: Negative for myalgias and arthralgias.  Skin: Negative for pallor and wound.  Neurological: Negative for dizziness but positive for  headaches  SUBJECTIVE:   VITAL SIGNS: BP (!) 141/72 (BP Location: Right Arm)   Pulse (!) 43   Temp 97.8 F (36.6 C) (Oral)   Resp 19   Ht 5\' 2"  (1.575 m)   Wt 158 lb 8.2 oz (71.9 kg)   SpO2 99%   BMI 28.99 kg/m   HEMODYNAMICS:    VENTILATOR SETTINGS: FiO2 (%):  [50 %] 50 %  INTAKE / OUTPUT: I/O last 3 completed  shifts: In: 2900 [I.V.:600; IV Piggyback:2300] Out: 1375 [Urine:1375]  PHYSICAL EXAMINATION: General:  NAD Neuro: AAO x 3, hard of hearing, speech is normal HEENT:  Lillie/AT, PERRLA, oral mucosa moist, trachea midline Cardiovascular:  AP regular, S1/S2, no MRG, +2 pulses, no edema Lungs: Bilateral breath sounds, diminished in the bases, fine crackles in the bases, no wheezing Abdomen: Non-distended, soft, normal bowel sounds, no organomegaly Musculoskeletal:  No joint swelling, no deformities Skin:  Warm and dry  LABS:  BMET  Recent Labs Lab 03/13/17 0841 03/13/17 1227  NA 143  --   K 4.1  --   CL 105  --   CO2 25  --   BUN 16  --   CREATININE 0.97 0.89  GLUCOSE 268*  --     Electrolytes  Recent Labs Lab 03/13/17 0841  CALCIUM 8.6*    CBC  Recent Labs Lab 03/13/17 0841 03/13/17 1227  WBC 13.2* 9.7  HGB 12.1 11.6*  HCT 36.8 35.2  PLT 274 182    Coag's  Recent Labs Lab 03/13/17 0841  INR 1.05    Sepsis Markers  Recent Labs Lab 03/13/17 0850 03/13/17 1227  LATICACIDVEN 5.7* 2.1*    ABG No results for input(s): PHART, PCO2ART, PO2ART in the last 168 hours.  Liver Enzymes  Recent Labs Lab 03/13/17 0841  AST 23  ALT 10*  ALKPHOS 59  BILITOT 0.7  ALBUMIN 3.1*    Cardiac Enzymes  Recent Labs Lab 03/13/17 0841  TROPONINI 0.03*    Glucose No results for input(s): GLUCAP in the last 168 hours.  Imaging Dg Chest Port 1 View  Result Date: 03/13/2017 CLINICAL DATA:  Shortness of breath EXAM: PORTABLE CHEST 1 VIEW COMPARISON:  06/03/2016 FINDINGS: Chronic cardiomegaly. Generalized interstitial opacities with Kerley lines, mildly improved. There is hyperinflation. No effusion or pneumothorax seen. No acute osseous finding. Remote medial clavicle fracture and repair on the right. IMPRESSION: 1. Mildly improved interstitial opacity, likely improving CHF. 2. COPD with hyperinflation. Electronically Signed   By: Marnee Spring M.D.   On:  03/13/2017 10:09     STUDIES:  None  CULTURES: MRSA screen positive. Blood cultures 2. Urine culture  ANTIBIOTICS: Azithromycin. Zosyn  SIGNIFICANT EVENTS: 03/13/2017: Admitted with respiratory failure  LINES/TUBES: Peripheral IVs  DISCUSSION: This is a 71 year old Caucasian female presented with acute respiratory failure secondary to pneumonia, sepsis, septic shock, acute COPD exacerbation and community-acquired pneumonia  ASSESSMENT Acute hypoxic respiratory failure Septic shock. Sepsis secondary to pneumonia. Community-acquired pneumonia. Acute COPD exacerbation secondary to pneumonia Headache-possibly migraine. History of type 2 diabetes History of congestive heart failure. History of hypertension.   PLAN Continuous BiPAP and titrate off as tolerated. Supplemental oxygen via nasal cannula. Antibiotics as above. Nebulized bronchodilators. IV steroids. Follow-up cultures Hold home blood pressure medications in light of hypotension. Blood glucose monitoring with sliding-scale insulin coverage. Monitor and replace electrolytes Monitor fever curve, antrum and pro-calcitonin  Tylenol and Toradol when necessary for headache GI and DVT prophylaxis. Further changes in treatment plan pending clinical course and diagnostics  FAMILY  - Updates: Family at bedside. We'll updated when available  - Inter-disciplinary family meet or Palliative Care meeting due by:  day 7    Jiyaan Steinhauser [facility] ANP-BC Pulmonary and Critical Care Medicine [facility] Pager [phone] or [phone]  03/13/2017, 8:55 PM

## 2017-03-13 NOTE — ED Provider Notes (Signed)
[facility] Emergency Department Provider Note ____________________________________________   I have reviewed the triage vital signs and the triage nursing note.  HISTORY  Chief Complaint Shortness of Breath   Historian Patient history limited by respiratory distress. Level 5 caveat History from EMS   HPI [name] [name] is a 71 y.o. female brought in by EMS, called to patient's home where family members were present, patient with history of COPD and CHF, sounds like trouble breathing since yesterday. Reportedly on home 2 L oxygen in the mid 60s O2 saturations with severe respiratory distress rhonchi and rales as well as wheezing. Patient with altered mental status, waxing and waning in level of alertness.  No report regarding CODE STATUS.  Patient shakes her head no to a question of whether or not she is having pain.  EMS reports sinus tachycardia with short runs of V. tach on the way in.    Past Medical History:  Diagnosis Date  . CHF (congestive heart failure) (HCC)   . COPD (chronic obstructive pulmonary disease) (HCC)   . Hypertension   . Lung nodule     Patient Active Problem List   Diagnosis Date Noted  . Acute on chronic congestive heart failure (HCC) 06/03/2016  . COPD exacerbation (HCC)   . Respiratory distress   . Wheezing     Past Surgical History:  Procedure Laterality Date  . REPLACEMENT TOTAL KNEE      Prior to Admission medications   Medication Sig Start Date End Date Taking? Authorizing Provider  aspirin 81 MG chewable tablet Chew 81 mg by mouth daily.   Yes [provider]  atorvastatin (LIPITOR) 40 MG tablet Take 40 mg by mouth daily at 6 PM.   Yes [provider]  budesonide-formoterol (SYMBICORT) 160-4.5 MCG/ACT inhaler Inhale 2 puffs into the lungs 2 (two) times daily.   Yes [provider]  carvedilol (COREG) 25 MG tablet Take 1 tablet by mouth 2 (two) times daily. 01/22/17  Yes [provider]  citalopram (CELEXA) 20 MG tablet Take 20 mg by mouth daily.   Yes [provider]  DULoxetine (CYMBALTA) 30 MG capsule Take 30 mg by mouth daily.   Yes [provider]  ferrous sulfate 325 (65 FE) MG tablet Take 1 tablet by mouth daily. 02/09/17  Yes [provider]  fluticasone (FLONASE) 50 MCG/ACT nasal spray Place 2 sprays into both nostrils daily.   Yes [provider]  furosemide (LASIX) 80 MG tablet Take 1 tablet by mouth 2 (two) times daily. 02/09/17  Yes [provider]  losartan (COZAAR) 25 MG tablet Take 1 tablet by mouth daily. 02/09/17  Yes [provider]  omeprazole (PRILOSEC) 20 MG capsule Take 20 mg by mouth daily.   Yes [provider]  potassium chloride (K-DUR,KLOR-CON) 10 MEQ tablet Take 10 mEq by mouth daily.   Yes [provider]  albuterol (PROVENTIL HFA;VENTOLIN HFA) 108 (90 Base) MCG/ACT inhaler Inhale 1 puff into the lungs every 6 (six) hours as needed for wheezing or shortness of breath.    [provider]  alum & mag hydroxide-simeth (MAALOX/MYLANTA) 200-200-20 MG/5ML suspension Take 5 mLs by mouth every 8 (eight) hours as needed for indigestion or heartburn.    [provider]  carvedilol (COREG) 6.25 MG tablet Take 1 tablet (6.25 mg total) by mouth 2 (two) times daily with a meal. Patient not taking: Reported on 03/13/2017 06/05/16   Shaune Pollack, MD  diphenhydrAMINE (BENADRYL) 25 mg  capsule Take 25 mg by mouth every 6 (six) hours as needed.    [provider]  sacubitril-valsartan (ENTRESTO) 24-26 MG Take 1 tablet by mouth 2 (two) times daily. Patient not taking: Reported on 03/13/2017 06/05/16   Shaune Pollack, MD  spironolactone (ALDACTONE) 25 MG tablet Take 1 tablet (25 mg total) by mouth daily. Patient not taking: Reported on 03/13/2017 06/05/16   Shaune Pollack, MD    Allergies  Allergen Reactions  . Sulfa Antibiotics Itching    History reviewed. No pertinent  family history.  Social History Social History  Substance Use Topics  . Smoking status: Former Games developer  . Smokeless tobacco: Never Used  . Alcohol use No    Review of Systems Flow 5 caveat Limited by critical illness/severe respiratory distress. ____________________________________________   PHYSICAL EXAM:  VITAL SIGNS: ED Triage Vitals [03/13/17 0835]  Enc Vitals Group     BP (!) 152/140     Pulse Rate (!) 45     Resp (!) 28     Temp      Temp src      SpO2 97 %     Weight      Height      Head Circumference      Peak Flow      Pain Score      Pain Loc      Pain Edu?      Excl. in GC?      Constitutional: Alert And shakes head yes and no, unable to speak due to respiratory distress. Severe respiratory distress. HEENT   Head: Normocephalic and atraumatic.      Eyes: Conjunctivae are normal. Pupils equal and round.       Ears:         Nose: No congestion/rhinnorhea.   Mouth/Throat: Mucous membranes are moist.   Neck: No stridor. Cardiovascular/Chest: Tachycardic, irregularly irregular.  No murmurs, rubs, or gallops. Respiratory: Tachypnea with subcostal retractions. Breath sounds with active wheezing in all fields. Moderate rales and there are also rhonchi at both bases. Gastrointestinal: Soft. No distention, no guarding, no rebound. Nontender.  Obese  Genitourinary/rectal:Deferred Musculoskeletal: Nontender with normal range of motion in all extremities. No joint effusions.  No lower extremity tenderness.  No edema. Neurologic:  Unable to speak in sentences due to respiratory distress, but is alert and follow commands. No gross or focal neurologic deficits are appreciated. Skin:  Skin is warm, dry and intact. No rash noted. Psychiatric: No psychosis.   ____________________________________________  LABS (pertinent positives/negatives)  Labs Reviewed  COMPREHENSIVE METABOLIC PANEL - Abnormal; Notable for the following:       Result Value   Glucose,  Bld 268 (*)    Calcium 8.6 (*)    Total Protein 6.2 (*)    Albumin 3.1 (*)    ALT 10 (*)    GFR calc non Af Amer 57 (*)    All other components within normal limits  LACTIC ACID, PLASMA - Abnormal; Notable for the following:    Lactic Acid, Venous 5.7 (*)    All other components within normal limits  CBC WITH DIFFERENTIAL/PLATELET - Abnormal; Notable for the following:    WBC 13.2 (*)    RDW 16.7 (*)    Neutro Abs 7.0 (*)    Lymphs Abs 5.4 (*)    All other components within normal limits  BLOOD GAS, VENOUS - Abnormal; Notable for the following:    pH, Ven 7.22 (*)    pCO2, Ven 70 (*)  pO2, Ven 54.0 (*)    Bicarbonate 28.6 (*)    All other components within normal limits  URINALYSIS, COMPLETE (UACMP) WITH MICROSCOPIC - Abnormal; Notable for the following:    Color, Urine YELLOW (*)    APPearance HAZY (*)    Hgb urine dipstick SMALL (*)    Leukocytes, UA SMALL (*)    Squamous Epithelial / LPF 0-5 (*)    All other components within normal limits  CULTURE, BLOOD (ROUTINE X 2)  CULTURE, BLOOD (ROUTINE X 2)  URINE CULTURE  PROTIME-INR  BRAIN NATRIURETIC PEPTIDE  TROPONIN I  LACTIC ACID, PLASMA    ____________________________________________    EKG I, Governor Rooks, MD, the attending physician have personally viewed and interpreted all ECGs.  1 36 bpm. Sinus tachycardia. Nonspecific interventricular conduction delay. Frequent PVCs. Nonspecific ST and T-wave. ____________________________________________  RADIOLOGY All Xrays were viewed by me. Imaging interpreted by Radiologist.    Chest x-ray:  IMPRESSION: 1. Mildly improved interstitial opacity, likely improving CHF. 2. COPD with hyperinflation. __________________________________________  PROCEDURES  Procedure(s) performed: None  Critical Care performed: CRITICAL CARE Performed by: Governor Rooks   Total critical care time: 70 minutes  Critical care time was exclusive of separately billable procedures and  treating other patients.  Critical care was necessary to treat or prevent imminent or life-threatening deterioration.  Critical care was time spent personally by me on the following activities: development of treatment plan with patient and/or surrogate as well as nursing, discussions with consultants, evaluation of patient's response to treatment, examination of patient, obtaining history from patient or surrogate, ordering and performing treatments and interventions, ordering and review of laboratory studies, ordering and review of radiographic studies, pulse oximetry and re-evaluation of patient's condition.   ____________________________________________   ED COURSE / ASSESSMENT AND PLAN  Pertinent labs & imaging results that were available during my care of the patient were reviewed by me and considered in my medical decision making (see chart for details).   Patient indicates that she is a full code. I am going to try BiPAP first. Unclear whether or not this is mostly CHF versus mostly COPD, will start treatment for both. With elevated pressures in the 190s over 170s, patient given supplemental nitroglycerin placed on BiPAP.  In the 140s in the 120s. Patient still remains tachycardic although 30s down into the mid 20s. Blood pressure did drop with initial nitroglycerin to 100 systolic.   Patient looked slightly more comfortable but BP dropped into 50-70 systolic after 10-15 min - seemed more remote timing for the one sl nitro.  Patient starting on antibiotics for possible sepsis.  IVF started with 2 pressure bag bolus.  BP up to 90 systolic, hr in 90s, RR 20, patient resting comfortably on bipap.  Lactate greater than 5, received 2nd and 3rd liter NS, bp 70-90 systolic, maps above 65.  Suspect sepsis as source of hypotension.  9:45, mental status and respiratory status much improved, patient talking through the bipap.  10:30, blood pressure holding systolic of 90, maps above  65.  Chest x-ray without focal pneumonia, however I'm still suspicious of infectious etiology. In any case, she is also, creatinine, patient was given Solu-Medrol initially.  Troponin pending at time of hospitalist consultation for admission.    CONSULTATIONS:   Hospitalist for admission, Dr. Letitia Libra.  Patient / Family / Caregiver informed of clinical course, medical decision-making process, and agree with plan.  ___________________________________________   FINAL CLINICAL IMPRESSION(S) / ED DIAGNOSES   Final diagnoses:  Sepsis  due to undetermined organism [facility])  Acute on chronic respiratory failure with hypoxia [facility])              Note: This dictation was prepared with Dragon dictation. Any transcriptional errors that result from this process are unintentional    Governor Rooks, MD 03/13/17 1037

## 2017-03-13 NOTE — ED Notes (Signed)
Admitting MD at bedside.

## 2017-03-13 NOTE — Progress Notes (Signed)
Off bipap since 1330 and now on 2 L nasal cannula.  A&Ox4.  No respiratory distress.  Blood pressure stable and has not required any pressors.  Per patient blood pressure is better when taken on right arm.

## 2017-03-13 NOTE — Progress Notes (Signed)
   03/13/17 1600  Clinical Encounter Type  Visited With Patient;Patient and family together  Visit Type Initial;Spiritual support  Referral From Patient;Nurse  Spiritual Encounters  Spiritual Needs Prayer  Patient requested prayer at intake; CH prayed at bedside with patient and her son; Spiritual support offered.

## 2017-03-13 NOTE — ED Notes (Signed)
Pt put on Bi-PAP

## 2017-03-13 NOTE — ED Notes (Signed)
Pt voided

## 2017-03-13 NOTE — Progress Notes (Signed)
Dose for Lovenox  DVT prophylaxis was adjusted to 40mg  daily from 30mg  daily due to compliance with [facility] protocol.   Yolanda Bonine, PharmD Pharmacy Resident  03/13/2017 [phone]

## 2017-03-13 NOTE — ED Triage Notes (Signed)
Per EMS pt comes from home with labored breathing and ShOB.  Pt is currently taking 80 of Lasix daily.  Pt was in ST in the 130s in route and now HR at 139.  Pt is pale and RR 35.  Pt is on 10L non rebreather upon arrival.  Pt has Rhales/Rhonchi breath sounds.

## 2017-03-13 NOTE — H&P (Signed)
[name] [name] is an 71 y.o. female.   Chief Complaint: shortness of breath HPI: this 71 year old female was a history of COPD that is O2 dependent. For the past few days she's had worsening shortness of breath cough and wheezing. Her niece who is staying with her also had a respiratory illness and is currently on the ventilator at [facility]. She came in with elevated blood pressure and was given some nitroglycerin with the thoughts this may be a CHF exacerbation secondary to hypertension. Chest x-ray did not show CHF and her blood pressure has dropped dramatically and still remains low. We'll go ahead and admit her to the ICU.  Past Medical History:  Diagnosis Date  . CHF (congestive heart failure) ([place])   . COPD (chronic obstructive pulmonary disease) ([place])   . Hypertension   . Lung nodule     Past Surgical History:  Procedure Laterality Date  . REPLACEMENT TOTAL KNEE      History reviewed. No pertinent family history. Social History:  reports that she has quit smoking. She has never used smokeless tobacco. She reports that she does not drink alcohol. Her drug history is not on file.  Allergies:  Allergies  Allergen Reactions  . Sulfa Antibiotics Itching     (Not in a hospital admission)  Results for orders placed or performed during the hospital encounter of 03/13/17 (from the past 48 hour(s))  Comprehensive metabolic panel     Status: Abnormal   Collection Time: 03/13/17  8:41 AM  Result Value Ref Range   Sodium 143 135 - 145 mmol/L   Potassium 4.1 3.5 - 5.1 mmol/L   Chloride 105 101 - 111 mmol/L   CO2 25 22 - 32 mmol/L   Glucose, Bld 268 (H) 65 - 99 mg/dL   BUN 16 6 - 20 mg/dL   Creatinine, Ser 0.97 0.44 - 1.00 mg/dL   Calcium 8.6 (L) 8.9 - 10.3 mg/dL   Total Protein 6.2 (L) 6.5 - 8.1 g/dL   Albumin 3.1 (L) 3.5 - 5.0 g/dL   AST 23 15 - 41 U/L   ALT 10 (L) 14 - 54 U/L   Alkaline Phosphatase 59 38 - 126 U/L   Total Bilirubin 0.7 0.3 - 1.2 mg/dL   GFR calc non Af Amer 57  (L) >60 mL/min   GFR calc Af Amer >60 >60 mL/min    Comment: (NOTE) The eGFR has been calculated using the CKD EPI equation. This calculation has not been validated in all clinical situations. eGFR's persistently <60 mL/min signify possible Chronic Kidney Disease.    Anion gap 13 5 - 15  CBC with Differential     Status: Abnormal   Collection Time: 03/13/17  8:41 AM  Result Value Ref Range   WBC 13.2 (H) 3.6 - 11.0 K/uL   RBC 4.07 3.80 - 5.20 MIL/uL   Hemoglobin 12.1 12.0 - 16.0 g/dL   HCT 36.8 35.0 - 47.0 %   MCV 90.4 80.0 - 100.0 fL   MCH 29.6 26.0 - 34.0 pg   MCHC 32.8 32.0 - 36.0 g/dL   RDW 16.7 (H) 11.5 - 14.5 %   Platelets 274 150 - 440 K/uL   Neutrophils Relative % 53 %   Neutro Abs 7.0 (H) 1.4 - 6.5 K/uL   Lymphocytes Relative 41 %   Lymphs Abs 5.4 (H) 1.0 - 3.6 K/uL   Monocytes Relative 4 %   Monocytes Absolute 0.6 0.2 - 0.9 K/uL   Eosinophils Relative 2 %  Eosinophils Absolute 0.2 0 - 0.7 K/uL   Basophils Relative 0 %   Basophils Absolute 0.0 0 - 0.1 K/uL  Protime-INR     Status: None   Collection Time: 03/13/17  8:41 AM  Result Value Ref Range   Prothrombin Time 13.7 11.4 - 15.2 seconds   INR 1.05   Lactic acid, plasma     Status: Abnormal   Collection Time: 03/13/17  8:50 AM  Result Value Ref Range   Lactic Acid, Venous 5.7 (HH) 0.5 - 1.9 mmol/L    Comment: CRITICAL RESULT CALLED TO, READ BACK BY AND VERIFIED WITH KRISTIN SWINDLE @ 0920 03/13/17 BY [facility]   Blood gas, venous     Status: Abnormal   Collection Time: 03/13/17  9:16 AM  Result Value Ref Range   FIO2 0.60    Delivery systems BILEVEL POSITIVE AIRWAY PRESSURE    LHR 8 resp/min   pH, Ven 7.22 (L) 7.250 - 7.430   pCO2, Ven 70 (H) 44.0 - 60.0 mmHg   pO2, Ven 54.0 (H) 32.0 - 45.0 mmHg   Bicarbonate 28.6 (H) 20.0 - 28.0 mmol/L   Acid-base deficit 0.6 0.0 - 2.0 mmol/L   O2 Saturation 80.3 %   Patient temperature 37.0    Collection site VEIN    Sample type VENOUS   Urinalysis, Complete w  Microscopic     Status: Abnormal   Collection Time: 03/13/17  9:47 AM  Result Value Ref Range   Color, Urine YELLOW (A) YELLOW   APPearance HAZY (A) CLEAR   Specific Gravity, Urine 1.005 1.005 - 1.030   pH 6.0 5.0 - 8.0   Glucose, UA NEGATIVE NEGATIVE mg/dL   Hgb urine dipstick SMALL (A) NEGATIVE   Bilirubin Urine NEGATIVE NEGATIVE   Ketones, ur NEGATIVE NEGATIVE mg/dL   Protein, ur NEGATIVE NEGATIVE mg/dL   Nitrite NEGATIVE NEGATIVE   Leukocytes, UA SMALL (A) NEGATIVE   RBC / HPF 0-5 0 - 5 RBC/hpf   WBC, UA 0-5 0 - 5 WBC/hpf   Bacteria, UA NONE SEEN NONE SEEN   Squamous Epithelial / LPF 0-5 (A) NONE SEEN   Amorphous Crystal PRESENT    Dg Chest Port 1 View  Result Date: 03/13/2017 CLINICAL DATA:  Shortness of breath EXAM: PORTABLE CHEST 1 VIEW COMPARISON:  06/03/2016 FINDINGS: Chronic cardiomegaly. Generalized interstitial opacities with Kerley lines, mildly improved. There is hyperinflation. No effusion or pneumothorax seen. No acute osseous finding. Remote medial clavicle fracture and repair on the right. IMPRESSION: 1. Mildly improved interstitial opacity, likely improving CHF. 2. COPD with hyperinflation. Electronically Signed   By: Monte Fantasia M.D.   On: 03/13/2017 10:09    Review of Systems  Constitutional: Negative for chills and fever.  HENT: Negative for hearing loss.   Eyes: Negative for blurred vision.  Respiratory: Positive for cough, shortness of breath and wheezing.   Cardiovascular: Negative for chest pain.  Gastrointestinal: Negative for nausea and vomiting.  Genitourinary: Negative for dysuria.  Musculoskeletal: Negative for myalgias.  Skin: Negative for rash.    Blood pressure (!) 76/59, pulse 79, temperature 97.9 F (36.6 C), temperature source Rectal, resp. rate (!) 26, SpO2 100 %. Physical Exam  Constitutional: She is oriented to person, place, and time.  Mildly obese female in acute respiratory distress.  HENT:  Head: Normocephalic and atraumatic.   Mouth/Throat: Oropharynx is clear and moist. No oropharyngeal exudate.  Eyes: Pupils are equal, round, and reactive to light. No scleral icterus.  Neck: Neck supple. No JVD  present. No tracheal deviation present. No thyromegaly present.  Cardiovascular: Normal rate and regular rhythm.   No murmur heard. Respiratory:  Cor scattered rhonchi. Using a sensory muscles. Currently on BiPAP  GI: Soft. Bowel sounds are normal. She exhibits no distension and no mass.  Musculoskeletal: She exhibits no edema or tenderness.  Lymphadenopathy:    She has no cervical adenopathy.  Neurological: She is alert and oriented to person, place, and time.  Skin: Skin is warm and dry.     Assessment/Plan 1. Sepsis with septic shock. Likely pulmonary source. Chest x-ray does not show any fulminant pneumonia. However she may have some bacterial bronchitis. We'll go ahead and treat underlying calls with antibiotics. We'll also give aggressive IV fluids. She's gotten 4 L here in the ER. We'll continue to give her fluid resuscitation. We'll go ahead and put her on some phenylephrine to support blood pressure. We'll admit her to the ICU. 2. Acute respiratory failure. Likely secondary to COPD exacerbation and bronchitis. She's currently on BiPAP and will continue this. If worsens she may need to go on a ventilator. We'll treated with antibiotics and bronchodilators. 3. Acute bronchitis. Again no pneumonia on chest x-ray. However she may be mildly dehydrated and it doesn't show yet. However she could have bronchitis. Going treated with Zosyn and Zithromax for now. Cultures are pending. 4. COPD exacerbation. We'll give her Solu-Medrol and nebulizers. 5. History of congestive heart failure. She does have a cardiomyopathy with an EF of 25%. Because for low blood pressure I will hold her Lasix and medications for heart failure this point. We'll have to monitor her fluids closely for any signs of fluid overload. Restart medications  when she tolerates. Next line total critical care time spent 60 minutes  Baxter Hire, MD 03/13/2017, 11:07 AM

## 2017-03-14 DIAGNOSIS — I255 Ischemic cardiomyopathy: Secondary | ICD-10-CM

## 2017-03-14 DIAGNOSIS — I5022 Chronic systolic (congestive) heart failure: Secondary | ICD-10-CM

## 2017-03-14 DIAGNOSIS — J9621 Acute and chronic respiratory failure with hypoxia: Secondary | ICD-10-CM

## 2017-03-14 DIAGNOSIS — A419 Sepsis, unspecified organism: Principal | ICD-10-CM

## 2017-03-14 DIAGNOSIS — I959 Hypotension, unspecified: Secondary | ICD-10-CM

## 2017-03-14 LAB — HEMOGLOBIN A1C
Hgb A1c MFr Bld: 5.1 % (ref 4.8–5.6)
Mean Plasma Glucose: 99.67 mg/dL

## 2017-03-14 LAB — COMPREHENSIVE METABOLIC PANEL
ALK PHOS: 44 U/L (ref 38–126)
ALT: 14 U/L (ref 14–54)
AST: 18 U/L (ref 15–41)
Albumin: 2.8 g/dL — ABNORMAL LOW (ref 3.5–5.0)
Anion gap: 11 (ref 5–15)
BUN: 16 mg/dL (ref 6–20)
CALCIUM: 8 mg/dL — AB (ref 8.9–10.3)
CO2: 27 mmol/L (ref 22–32)
CREATININE: 0.78 mg/dL (ref 0.44–1.00)
Chloride: 105 mmol/L (ref 101–111)
GFR calc non Af Amer: >60 mL/min (ref >60)
GLUCOSE: 146 mg/dL — AB (ref 65–99)
Potassium: 3.3 mmol/L — ABNORMAL LOW (ref 3.5–5.1)
SODIUM: 143 mmol/L (ref 135–145)
Total Bilirubin: 0.5 mg/dL (ref 0.3–1.2)
Total Protein: 5.4 g/dL — ABNORMAL LOW (ref 6.5–8.1)

## 2017-03-14 LAB — CBC
HCT: 32.1 % — ABNORMAL LOW (ref 35.0–47.0)
Hemoglobin: 10.6 g/dL — ABNORMAL LOW (ref 12.0–16.0)
MCH: 29.5 pg (ref 26.0–34.0)
MCHC: 32.9 g/dL (ref 32.0–36.0)
MCV: 89.5 fL (ref 80.0–100.0)
PLATELETS: 166 10*3/uL (ref 150–440)
RBC: 3.59 MIL/uL — ABNORMAL LOW (ref 3.80–5.20)
RDW: 16.5 % — ABNORMAL HIGH (ref 11.5–14.5)
WBC: 5 10*3/uL (ref 3.6–11.0)

## 2017-03-14 LAB — TROPONIN I: Troponin I: 0.03 ng/mL (ref <0.03)

## 2017-03-14 LAB — PROCALCITONIN: PROCALCITONIN: 0.12 ng/mL

## 2017-03-14 MED ORDER — METHYLPREDNISOLONE SODIUM SUCC 125 MG IJ SOLR
60.0000 mg | Freq: Two times a day (BID) | INTRAMUSCULAR | Status: DC
  Administered 2017-03-14 – 2017-03-16 (×5): 60 mg via INTRAVENOUS
  Filled 2017-03-14 (×5): qty 2

## 2017-03-14 MED ORDER — TRAMADOL-ACETAMINOPHEN 37.5-325 MG PO TABS
1.0000 | ORAL_TABLET | Freq: Four times a day (QID) | ORAL | Status: DC | PRN
  Administered 2017-03-14 – 2017-03-18 (×13): 1 via ORAL
  Filled 2017-03-14 (×14): qty 1

## 2017-03-14 MED ORDER — CARVEDILOL 3.125 MG PO TABS
3.1250 mg | ORAL_TABLET | Freq: Two times a day (BID) | ORAL | Status: DC
  Administered 2017-03-14 – 2017-03-15 (×2): 3.125 mg via ORAL
  Filled 2017-03-14 (×2): qty 1

## 2017-03-14 MED ORDER — FUROSEMIDE 10 MG/ML IJ SOLN
80.0000 mg | Freq: Once | INTRAMUSCULAR | Status: AC
  Administered 2017-03-14: 80 mg via INTRAVENOUS
  Filled 2017-03-14: qty 8

## 2017-03-14 MED ORDER — LOSARTAN POTASSIUM 25 MG PO TABS
25.0000 mg | ORAL_TABLET | Freq: Every day | ORAL | Status: DC
  Administered 2017-03-14 – 2017-03-15 (×2): 25 mg via ORAL
  Filled 2017-03-14 (×2): qty 1

## 2017-03-14 MED ORDER — FUROSEMIDE 10 MG/ML IJ SOLN
40.0000 mg | Freq: Two times a day (BID) | INTRAMUSCULAR | Status: DC
  Administered 2017-03-14 – 2017-03-17 (×7): 40 mg via INTRAVENOUS
  Filled 2017-03-14 (×7): qty 4

## 2017-03-14 MED ORDER — POTASSIUM CHLORIDE CRYS ER 20 MEQ PO TBCR
20.0000 meq | EXTENDED_RELEASE_TABLET | Freq: Every day | ORAL | Status: DC
  Administered 2017-03-14 – 2017-03-18 (×5): 20 meq via ORAL
  Filled 2017-03-14 (×5): qty 1

## 2017-03-14 MED ORDER — POTASSIUM CHLORIDE 20 MEQ PO PACK
60.0000 meq | PACK | Freq: Once | ORAL | Status: AC
  Administered 2017-03-14: 60 meq via ORAL
  Filled 2017-03-14: qty 3

## 2017-03-14 MED ORDER — FUROSEMIDE 40 MG PO TABS
40.0000 mg | ORAL_TABLET | Freq: Every day | ORAL | Status: DC
  Administered 2017-03-14: 40 mg via ORAL
  Filled 2017-03-14: qty 2

## 2017-03-14 MED ORDER — NITROGLYCERIN 2 % TD OINT
0.5000 [in_us] | TOPICAL_OINTMENT | Freq: Four times a day (QID) | TRANSDERMAL | Status: DC
  Administered 2017-03-14 – 2017-03-18 (×14): 0.5 [in_us] via TOPICAL
  Filled 2017-03-14 (×16): qty 1

## 2017-03-14 MED ORDER — HYDROCODONE-ACETAMINOPHEN 5-325 MG PO TABS: 1.0000 | ORAL_TABLET | Freq: Four times a day (QID) | ORAL | Status: DC | PRN

## 2017-03-14 NOTE — Consult Note (Addendum)
Cardiology Consultation:   Patient ID: [name] [name]; [id number]; 1945/12/11   Admit date: 03/13/2017 Date of Consult: 03/14/2017  Primary Care Provider: Patient, No Pcp Per Primary Cardiologist: new to [facility] Physician requesting consult: Dr. Winona Legato Reason for consult: shortness of breath, hypoxia, tachycardia, Chest tightness   Patient Profile:   [name] [name] is a 71 y.o. female with a hx of Chronic systolic CHF, coronary artery disease, long history of smoking, severe COPD, previous hospital admissions for acute respiratory distress requiring BiPAP, previously on hospice , presents with worsening shortness of breath  History of Present Illness:   Ms. Hector reports developing worsening cough, chest congestion, shortness of breath leading to hospital admission. Family was visiting, the patient feels they gave her upper respiratory infection. Symptoms over the past few days Notes from emergency room doctor indicate she was having oxygen saturation mid 60s on 2 L nasal cannula with severe respiratory distress rhonchi and Rales and wheezing. Also with some mental status changes, waxing waning alertness Hypertensive when first evaluated in the emergency room Blood pressure dropped after she received nitroglycerin  On arrival to the emergency room she was hypoxic, CO2 level elevated, elevated BNP greater than 2500, lactic acid 5.7, leukocytosis Chest x-ray concerning for CHF and COPD She was started on Zosyn and Zithromax She was started on BiPAP She was given nitroglycerin in the emergency room causing hypertension She received 4 L of fluids in the emergency room She was started on signed steroids and nebulizers  Today on rounds she reports having fullness in her chest, shortness of breath IV fluids still running 100 cc/h, started in the emergency room  She reports that since she was discharged from hospice she has not been taking diuretic on a regular basis. Reports that she  has "been in and out of the hospital every months for shortness of breath"   Past Medical History:  Diagnosis Date  . CHF (congestive heart failure) (HCC)   . COPD (chronic obstructive pulmonary disease) (HCC)   . Hypertension   . Lung nodule     Past Surgical History:  Procedure Laterality Date  . REPLACEMENT TOTAL KNEE       Home Medications:  Prior to Admission medications   Medication Sig Start Date End Date Taking? Authorizing Provider  aspirin 81 MG chewable tablet Chew 81 mg by mouth daily.   Yes [provider]  atorvastatin (LIPITOR) 40 MG tablet Take 40 mg by mouth daily at 6 PM.   Yes [provider]  budesonide-formoterol (SYMBICORT) 160-4.5 MCG/ACT inhaler Inhale 2 puffs into the lungs 2 (two) times daily.   Yes [provider]  carvedilol (COREG) 25 MG tablet Take 1 tablet by mouth 2 (two) times daily. 01/22/17  Yes [provider]  citalopram (CELEXA) 20 MG tablet Take 20 mg by mouth daily.   Yes [provider]  DULoxetine (CYMBALTA) 30 MG capsule Take 30 mg by mouth daily.   Yes [provider]  ferrous sulfate 325 (65 FE) MG tablet Take 1 tablet by mouth daily. 02/09/17  Yes [provider]  fluticasone (FLONASE) 50 MCG/ACT nasal spray Place 2 sprays into both nostrils daily.   Yes [provider]  furosemide (LASIX) 80 MG tablet Take 1 tablet by mouth 2 (two) times daily. 02/09/17  Yes [provider]  losartan (COZAAR) 25 MG tablet Take 1 tablet by mouth daily. 02/09/17  Yes [provider]  omeprazole (PRILOSEC) 20 MG capsule Take 20 mg  by mouth daily.   Yes [provider]  potassium chloride (K-DUR,KLOR-CON) 10 MEQ tablet Take 10 mEq by mouth daily.   Yes [provider]  albuterol (PROVENTIL HFA;VENTOLIN HFA) 108 (90 Base) MCG/ACT inhaler Inhale 1 puff into the lungs every 6 (six) hours as needed for wheezing or shortness of breath.    [provider]   alum & mag hydroxide-simeth (MAALOX/MYLANTA) 200-200-20 MG/5ML suspension Take 5 mLs by mouth every 8 (eight) hours as needed for indigestion or heartburn.    [provider]  carvedilol (COREG) 6.25 MG tablet Take 1 tablet (6.25 mg total) by mouth 2 (two) times daily with a meal. Patient not taking: Reported on 03/13/2017 06/05/16   Shaune Pollack, MD  diphenhydrAMINE (BENADRYL) 25 mg capsule Take 25 mg by mouth every 6 (six) hours as needed.    [provider]  sacubitril-valsartan (ENTRESTO) 24-26 MG Take 1 tablet by mouth 2 (two) times daily. Patient not taking: Reported on 03/13/2017 06/05/16   Shaune Pollack, MD  spironolactone (ALDACTONE) 25 MG tablet Take 1 tablet (25 mg total) by mouth daily. Patient not taking: Reported on 03/13/2017 06/05/16   Shaune Pollack, MD    Inpatient Medications: Scheduled Meds: . albuterol  5 mg Nebulization Once  . aspirin  81 mg Oral Daily  . carvedilol  3.125 mg Oral BID WC  . Chlorhexidine Gluconate Cloth  6 each Topical Q0600  . citalopram  20 mg Oral Daily  . DULoxetine  30 mg Oral Daily  . enoxaparin (LOVENOX) injection  40 mg Subcutaneous Q24H  . furosemide  40 mg Intravenous BID  . ipratropium-albuterol  3 mL Nebulization Q6H  . losartan  25 mg Oral Daily  . mouth rinse  15 mL Mouth Rinse BID  . methylPREDNISolone (SOLU-MEDROL) injection  60 mg Intravenous Q12H  . mupirocin ointment  1 application Nasal BID  . nitroGLYCERIN  0.5 inch Topical Q6H  . pantoprazole  40 mg Oral Daily  . potassium chloride  20 mEq Oral Daily   Continuous Infusions: . azithromycin Stopped (03/14/17 1210)  . phenylephrine (NEO-SYNEPHRINE) Adult infusion    . piperacillin-tazobactam (ZOSYN)  IV 3.375 g (03/14/17 1507)   PRN Meds: acetaminophen **OR** acetaminophen, ondansetron **OR** ondansetron (ZOFRAN) IV, traMADol-acetaminophen  Allergies:    Allergies  Allergen Reactions  . Sulfa Antibiotics Itching    Social History:   Social History    Social History  . Marital status: Widowed    Spouse name: N/A  . Number of children: N/A  . Years of education: N/A   Occupational History  . Not on file.   Social History Main Topics  . Smoking status: Former Games developer  . Smokeless tobacco: Never Used  . Alcohol use No  . Drug use: Unknown  . Sexual activity: Not on file   Other Topics Concern  . Not on file   Social History Narrative  . No narrative on file    Family History:   History reviewed. No pertinent family history.   ROS:  Please see the history of present illness.  Review of Systems  Constitution: Negative for diaphoresis, fever, weakness, malaise/fatigue and night sweats.  HENT: Negative.   Eyes: Negative.   Cardiovascular: Negative for chest pain, claudication, cyanosis, dyspnea on exertion, irregular heartbeat, leg swelling, near-syncope, orthopnea, palpitations and paroxysmal nocturnal dyspnea.  Respiratory: Positive for cough and shortness of breath. Negative for sleep disturbances due to breathing and wheezing.   Endocrine: Negative.   Hematologic/Lymphatic: Negative.  Skin: Negative.   Musculoskeletal: Negative for falls, joint pain, joint swelling and myalgias.  Gastrointestinal: Negative.   Neurological: Negative for difficulty with concentration, excessive daytime sleepiness, dizziness, focal weakness, light-headedness and numbness.  Psychiatric/Behavioral: Negative.     All other ROS reviewed and negative.     Physical Exam/Data:   Vitals:   03/14/17 0900 03/14/17 1000 03/14/17 1100 03/14/17 1200  BP: (!) 151/133 139/89 136/71 (!) 150/79  Pulse: 98 89 75 (!) 50  Resp: (!) 21 19 (!) 23 (!) 21  Temp:      TempSrc:      SpO2: 100% 95% 96% 100%  Weight:      Height:        Intake/Output Summary (Last 24 hours) at 03/14/17 1542 Last data filed at 03/14/17 1344  Gross per 24 hour  Intake             2350 ml  Output             1550 ml  Net              800 ml   Filed Weights    03/13/17 1207  Weight: 158 lb 8.2 oz (71.9 kg)   Body mass index is 28.99 kg/m.  General:  Well nourished, well developed, in no acute distress, mild distress, on nasal cannula oxygen HEENT: normal Lymph: no adenopathy Neck: no JVD Endocrine:  No thryomegaly Vascular: No carotid bruits; FA pulses 2+ bilaterally without bruits  Cardiac:  normal S1, S2; RRR; no murmur  Lungs:  Moderately decreased breath sounds throughout, scattered rales, wheezing Abd: soft, nontender, no hepatomegaly  Ext: no edema Musculoskeletal:  No deformities, BUE and BLE strength normal and equal Skin: warm and dry  Neuro:  CNs 2-12 intact, no focal abnormalities noted Psych:  Normal affect   EKG:  The EKG was personally reviewed and demonstrates:  Normal sinus rhythm rate 136 bpm, unable to exclude old anterior MI, nonspecific ST abnormality  Telemetry:  Telemetry was personally reviewed and demonstrates: Sinus rhythm  Relevant CV Studies:  Previous echocardiogram 2017 showing ejection fraction 25%  Laboratory Data:  Chemistry Recent Labs Lab 03/13/17 0841 03/13/17 1227 03/14/17 0426  NA 143  --  143  K 4.1  --  3.3*  CL 105  --  105  CO2 25  --  27  GLUCOSE 268*  --  146*  BUN 16  --  16  CREATININE 0.97 0.89 0.78  CALCIUM 8.6*  --  8.0*  GFRNONAA 57* >60 >60  GFRAA >60 >60 >60  ANIONGAP 13  --  11     Recent Labs Lab 03/13/17 0841 03/14/17 0426  PROT 6.2* 5.4*  ALBUMIN 3.1* 2.8*  AST 23 18  ALT 10* 14  ALKPHOS 59 44  BILITOT 0.7 0.5   Hematology Recent Labs Lab 03/13/17 0841 03/13/17 1227 03/14/17 0426  WBC 13.2* 9.7 5.0  RBC 4.07 3.91 3.59*  HGB 12.1 11.6* 10.6*  HCT 36.8 35.2 32.1*  MCV 90.4 90.0 89.5  MCH 29.6 29.7 29.5  MCHC 32.8 33.0 32.9  RDW 16.7* 16.2* 16.5*  PLT 274 182 166   Cardiac Enzymes Recent Labs Lab 03/13/17 0841 03/14/17 0426  TROPONINI 0.03* 0.03*   No results for input(s): TROPIPOC in the last 168 hours.  BNP Recent Labs Lab  03/13/17 0841  BNP 2,874.0*    DDimer No results for input(s): DDIMER in the last 168 hours.  Radiology/Studies:  Dg Chest Port 1 [address]  Result Date: 03/13/2017 CLINICAL DATA:  Shortness of breath EXAM: PORTABLE CHEST 1 VIEW COMPARISON:  06/03/2016 FINDINGS: Chronic cardiomegaly. Generalized interstitial opacities with Kerley lines, mildly improved. There is hyperinflation. No effusion or pneumothorax seen. No acute osseous finding. Remote medial clavicle fracture and repair on the right. IMPRESSION: 1. Mildly improved interstitial opacity, likely improving CHF. 2. COPD with hyperinflation. Electronically Signed   By: Marnee Spring M.D.   On: 03/13/2017 10:09    Assessment and Plan:   1. Chest tightness No significant change in troponin, 0.033 times No indication for urgent ischemic workup at this time Onset of symptoms after significant IV fluid load on this hospitalization concerning for worsening heart failure in the setting of COPD exacerbation  2. Chronic systolic CHF Seem to 4 L of fluid in the ER for hypotension likely caused by nitroglycerin Has been on IV fluids overnight, ejection fraction 25% by history -Recommend to nursing that we stop her IV fluids, I will give her Lasix IV She has normal blood pressure, normal renal function Would likely benefit from regular Lasix dosing as an outpatient  3. COPD exacerbation Would continue antibiotics, steroids, nebulizers Outpatient nasal cannula oxygen Does not have pulmonary physician as an outpatient.  Seen by Dr.  Belia Heman in the hospital   4. Acute bronchitis In the setting of COPD exacerbation  5) CAD/cardiomyopathy  presumed to be ischemic cardiomyopathy.  She reports having prior cardiac catheterization at [facility] though nothing mentioned in the records  Unable to find previous stress testing  Would recommend stress test once pulmonary status improves.  This can be done as outpatient  -We can likely advance her losartan As  outpatient and consider changing to entresto Repeat echocardiogram pending   Total encounter time more than 110 minutes  Greater than 50% was spent in counseling and coordination of care with the patient  Signed, Julien Nordmann, MD  03/14/2017 3:42 PM

## 2017-03-14 NOTE — Progress Notes (Signed)
Report called to Brett Canales, RN on 2A. Pt transported to room 253 by [facility] [place]. All belongings with patient. Pt transported in stable condition.

## 2017-03-14 NOTE — Plan of Care (Signed)
Problem: Pain Managment: Goal: General experience of comfort will improve Outcome: Progressing Medicated for chronic R shoulder pain orally. Will continue to monitor. Jari Favre [facility]

## 2017-03-14 NOTE — Progress Notes (Signed)
[facility] [place] at [facility]   PATIENT NAME: [name] [name]    MR#:  [id number]  DATE OF BIRTH:  [birth year]  SUBJECTIVE:  CHIEF COMPLAINT:   Chief Complaint  Patient presents with  . Shortness of Breath   The patient is 71 year old Caucasian female with past medical history significant for history ofCHF, COPD, hypertension, lung nodule, who presents to the hospital with complaints of shortness of breath, cough, wheezing, worsening over the past few days.labs on arrival to the hospital revealed acidosis with pH of 7.22, PCO2 level of 70, hyperglycemia, elevated beta-type natruretic peptide, elevated troponin,lactic acid level to 5.7, leukocytosis. chest x-ray revealed interstitial abnormalities, concerning for CHF, COPD. Patient is of some shortness of breath and chest tightness. Complains of some cough, green phlegm production. MRSA PCR was positive. Patient is on zosyn and Zithromax at present. Whether cell count is normal. Blood pressure has improved to 150. The patient remains on 3 L of oxygen through nasal cannula.the patient also is complaining of headache.  Review of Systems  Constitutional: Negative for chills, fever and weight loss.  HENT: Negative for congestion.   Eyes: Negative for blurred vision and double vision.  Respiratory: Positive for cough, sputum production, shortness of breath and wheezing.   Cardiovascular: Negative for chest pain, palpitations, orthopnea, leg swelling and PND.  Gastrointestinal: Negative for abdominal pain, blood in stool, constipation, diarrhea, nausea and vomiting.  Genitourinary: Negative for dysuria, frequency, hematuria and urgency.  Musculoskeletal: Negative for falls.  Neurological: Positive for headaches. Negative for dizziness, tremors and focal weakness.  Endo/Heme/Allergies: Does not bruise/bleed easily.  Psychiatric/Behavioral: Negative for depression. The patient does not have insomnia.     VITAL SIGNS:  Blood pressure (!) 150/79, pulse (!) 50, temperature 97.9 F (36.6 C), temperature source Oral, resp. rate (!) 21, height 5\' 2"  (1.575 m), weight 71.9 kg (158 lb 8.2 oz), SpO2 100 %.  PHYSICAL EXAMINATION:   GENERAL:  71 y.o.-year-old patient lying in the bed in mild respiratory distress, somewhat tachypneic, has pursed lip breathing.  EYES: Pupils equal, round, reactive to light and accommodation. No scleral icterus. Extraocular muscles intact.  HEENT: Head atraumatic, normocephalic. Oropharynx and nasopharynx clear.  NECK:  Supple, no jugular venous distention. No thyroid enlargement, no tenderness.  LUNGS:  Diminished breath sounds bilaterally, few scattered wheezes, no  rales,rhonchi or crepitation.  Intermittent use of accessory muscles of respiration.  CARDIOVASCULAR: S1, S2 normal. No murmurs, rubs, or gallops.  ABDOMEN: Soft, nontender, nondistended. Bowel sounds present. No organomegaly or mass.  EXTREMITIES: No pedal edema, cyanosis, or clubbing.  NEUROLOGIC: Cranial nerves II through XII are intact. Muscle strength 5/5 in all extremities. Sensation intact. Gait not checked.  PSYCHIATRIC: The patient is alert and oriented x 3.  SKIN: No obvious rash, lesion, or ulcer.   ORDERS/RESULTS REVIEWED:   CBC  Recent Labs Lab 03/13/17 0841 03/13/17 1227 03/14/17 0426  WBC 13.2* 9.7 5.0  HGB 12.1 11.6* 10.6*  HCT 36.8 35.2 32.1*  PLT 274 182 166  MCV 90.4 90.0 89.5  MCH 29.6 29.7 29.5  MCHC 32.8 33.0 32.9  RDW 16.7* 16.2* 16.5*  LYMPHSABS 5.4*  --   --   MONOABS 0.6  --   --   EOSABS 0.2  --   --   BASOSABS 0.0  --   --    ------------------------------------------------------------------------------------------------------------------  Chemistries   Recent Labs Lab 03/13/17 0841 03/13/17 1227 03/14/17 0426  NA 143  --  143  K  4.1  --  3.3*  CL 105  --  105  CO2 25  --  27  GLUCOSE 268*  --  146*  BUN 16  --  16  CREATININE 0.97 0.89 0.78  CALCIUM 8.6*  --  8.0*   AST 23  --  18  ALT 10*  --  14  ALKPHOS 59  --  44  BILITOT 0.7  --  0.5   ------------------------------------------------------------------------------------------------------------------ estimated creatinine clearance is 58.1 mL/min (by C-G formula based on SCr of 0.78 mg/dL). ------------------------------------------------------------------------------------------------------------------ No results for input(s): TSH, T4TOTAL, T3FREE, THYROIDAB in the last 72 hours.  Invalid input(s): FREET3  Cardiac Enzymes  Recent Labs Lab 03/13/17 0841  TROPONINI 0.03*   ------------------------------------------------------------------------------------------------------------------ Invalid input(s): POCBNP ---------------------------------------------------------------------------------------------------------------  RADIOLOGY: Dg Chest Port 1 View  Result Date: 03/13/2017 CLINICAL DATA:  Shortness of breath EXAM: PORTABLE CHEST 1 VIEW COMPARISON:  06/03/2016 FINDINGS: Chronic cardiomegaly. Generalized interstitial opacities with Kerley lines, mildly improved. There is hyperinflation. No effusion or pneumothorax seen. No acute osseous finding. Remote medial clavicle fracture and repair on the right. IMPRESSION: 1. Mildly improved interstitial opacity, likely improving CHF. 2. COPD with hyperinflation. Electronically Signed   By: Marnee Spring M.D.   On: 03/13/2017 10:09    EKG:  Orders placed or performed during the hospital encounter of 03/13/17  . ED EKG  . ED EKG  . ED EKG  . ED EKG  . EKG 12-Lead  . EKG 12-Lead  . EKG 12-Lead  . EKG 12-Lead    ASSESSMENT AND PLAN:  Active Problems:   Sepsis (HCC)  #1. Sepsis due to acute bronchitis, continue broad-spectrum antibiotic therapy, get sputum cultures if possible #2. MRSA carrier, continue mupirocin intranasally  #3. Hypotension, resolved #4acute on chronic systolic CHF, continue Lasix, potassium supplements, Cozaar,  watching blood pressure readings closely #5yperglycemia, get hemoglobin A1c #6. Elevated troponin, likely demand ischemia, recheck troponin again today,his cardiologist involved for further recommendations, Continue aspirin, nitroglycerin topically   Management plans discussed with the patient, family and they are in agreement.   DRUG ALLERGIES:  Allergies  Allergen Reactions  . Sulfa Antibiotics Itching    CODE STATUS:     Code Status Orders        Start     Ordered   03/13/17 1206  Full code  Continuous     03/13/17 1205    Code Status History    Date Active Date Inactive Code Status Order ID Comments User Context   06/03/2016 10:45 AM 06/05/2016  4:30 PM Full Code [id number]  Shaune Pollack, MD Inpatient      TOTAL TIME TAKING CARE OF THIS PATIENT: 35 minutes.    Katharina Caper M.D on 03/14/2017 at 1:25 PM  Between 7am to 6pm - Pager - [phone]  After 6pm go to [email or web address] - password [email or web address]  [facility]  Office  [phone]  CC: Primary care physician; Patient, No Pcp Per

## 2017-03-14 NOTE — Progress Notes (Signed)
Patient on 2L oxygen via [place].  BiPap not utilized since 08/18//18 @ 1:30 pm.  Minimal assistance needed.  All medications tolerated.  No acute distress noted this shift.  Report given to oncoming nurse.

## 2017-03-15 ENCOUNTER — Inpatient Hospital Stay (HOSPITAL_COMMUNITY)
Admit: 2017-03-15 | Discharge: 2017-03-15 | Disposition: A | Discharge status: Home / Self Care | Payer: Medicare Other | Attending: Cardiovascular Disease | Admitting: Cardiovascular Disease

## 2017-03-15 DIAGNOSIS — I340 Nonrheumatic mitral (valve) insufficiency: Secondary | ICD-10-CM

## 2017-03-15 LAB — ECHOCARDIOGRAM COMPLETE
Height: 62 in
WEIGHTICAEL: 2536.17 [oz_av]

## 2017-03-15 LAB — LACTIC ACID, PLASMA: Lactic Acid, Venous: 1.8 mmol/L (ref 0.5–1.9)

## 2017-03-15 LAB — POTASSIUM: Potassium: 3.7 mmol/L (ref 3.5–5.1)

## 2017-03-15 LAB — PROCALCITONIN: Procalcitonin: <0.1 ng/mL

## 2017-03-15 LAB — GLUCOSE, CAPILLARY: Glucose-Capillary: 95 mg/dL (ref 65–99)

## 2017-03-15 LAB — MAGNESIUM: Magnesium: 1.8 mg/dL (ref 1.7–2.4)

## 2017-03-15 MED ORDER — CARVEDILOL 12.5 MG PO TABS
12.5000 mg | ORAL_TABLET | Freq: Two times a day (BID) | ORAL | Status: DC
  Administered 2017-03-15: 12.5 mg via ORAL
  Filled 2017-03-15: qty 1

## 2017-03-15 MED ORDER — LOSARTAN POTASSIUM 25 MG PO TABS
25.0000 mg | ORAL_TABLET | Freq: Two times a day (BID) | ORAL | Status: DC
  Administered 2017-03-15 – 2017-03-18 (×6): 25 mg via ORAL
  Filled 2017-03-15 (×6): qty 1

## 2017-03-15 NOTE — Progress Notes (Signed)
[facility] - [place] at [facility]   PATIENT NAME: [name] [name]    MR#:  [id number]  DATE OF BIRTH:  Dec 18, 1945  SUBJECTIVE:  CHIEF COMPLAINT:   Chief Complaint  Patient presents with  . Shortness of Breath   The patient is 71 year old Caucasian female with past medical history significant for history ofCHF, COPD, hypertension, lung nodule, who presents to the hospital with complaints of shortness of breath, cough, wheezing, worsening over the past few days.labs on arrival to the hospital revealed acidosis with pH of 7.22, PCO2 level of 70, hyperglycemia, elevated beta-type natruretic peptide, elevated troponin,lactic acid level to 5.7, leukocytosis. chest x-ray revealed interstitial abnormalities, concerning for CHF, COPD. Patient is of some shortness of breath and chest tightness. Complains of some cough, green phlegm production. MRSA PCR was positive. Patient is on zosyn and Zithromax at present. White blood cell count is normal. Blood pressure has improved to 150. The patient was given Lasix yesterday, however, remains on r 3 L of oxygen through nasal cannula.. She feels comfortable today. Denies any significant shortness of breath. Intermittent SVT.  The patient was seen by cardiologist, no further workup was recommended in the hospital, but echocardiogram., Which revealed ejection fraction of 25-30%, diastolic dysfunction, severe mitral regurgitation  Review of Systems  Constitutional: Negative for chills, fever and weight loss.  HENT: Negative for congestion.   Eyes: Negative for blurred vision and double vision.  Respiratory: Positive for cough, sputum production, shortness of breath and wheezing.   Cardiovascular: Negative for chest pain, palpitations, orthopnea, leg swelling and PND.  Gastrointestinal: Negative for abdominal pain, blood in stool, constipation, diarrhea, nausea and vomiting.  Genitourinary: Negative for dysuria, frequency, hematuria and  urgency.  Musculoskeletal: Negative for falls.  Neurological: Positive for headaches. Negative for dizziness, tremors and focal weakness.  Endo/Heme/Allergies: Does not bruise/bleed easily.  Psychiatric/Behavioral: Negative for depression. The patient does not have insomnia.     VITAL SIGNS: Blood pressure (!) 155/69, pulse 83, temperature (!) 97.5 F (36.4 C), temperature source Oral, resp. rate 18, height 5\' 2"  (1.575 m), weight 71.9 kg (158 lb 8.2 oz), SpO2 99 %.  PHYSICAL EXAMINATION:   GENERAL:  71 y.o.-year-old patient lying in the bed in mild respiratory distress but more comfortable today  EYES: Pupils equal, round, reactive to light and accommodation. No scleral icterus. Extraocular muscles intact.  HEENT: Head atraumatic, normocephalic. Oropharynx and nasopharynx clear.  NECK:  Supple, no jugular venous distention. No thyroid enlargement, no tenderness.  LUNGS: Better air entrance bilaterally, few scattered wheezes, no  rales,rhonchi or crepitations .  Intermittent use of accessory muscles of respiration.  CARDIOVASCULAR: S1, S2 normal. No murmurs, rubs, or gallops.  ABDOMEN: Soft, nontender, nondistended. Bowel sounds present. No organomegaly or mass.  EXTREMITIES: No pedal edema, cyanosis, or clubbing.  NEUROLOGIC: Cranial nerves II through XII are intact. Muscle strength 5/5 in all extremities. Sensation intact. Gait not checked.  PSYCHIATRIC: The patient is alert and oriented x 3.  SKIN: No obvious rash, lesion, or ulcer.   ORDERS/RESULTS REVIEWED:   CBC  Recent Labs Lab 03/13/17 0841 03/13/17 1227 03/14/17 0426  WBC 13.2* 9.7 5.0  HGB 12.1 11.6* 10.6*  HCT 36.8 35.2 32.1*  PLT 274 182 166  MCV 90.4 90.0 89.5  MCH 29.6 29.7 29.5  MCHC 32.8 33.0 32.9  RDW 16.7* 16.2* 16.5*  LYMPHSABS 5.4*  --   --   MONOABS 0.6  --   --   EOSABS 0.2  --   --  BASOSABS 0.0  --   --     ------------------------------------------------------------------------------------------------------------------  Chemistries   Recent Labs Lab 03/13/17 0841 03/13/17 1227 03/14/17 0426 03/15/17 0552  NA 143  --  143  --   K 4.1  --  3.3* 3.7  CL 105  --  105  --   CO2 25  --  27  --   GLUCOSE 268*  --  146*  --   BUN 16  --  16  --   CREATININE 0.97 0.89 0.78  --   CALCIUM 8.6*  --  8.0*  --   MG  --   --   --  1.8  AST 23  --  18  --   ALT 10*  --  14  --   ALKPHOS 59  --  44  --   BILITOT 0.7  --  0.5  --    ------------------------------------------------------------------------------------------------------------------ estimated creatinine clearance is 58.1 mL/min (by C-G formula based on SCr of 0.78 mg/dL). ------------------------------------------------------------------------------------------------------------------ No results for input(s): TSH, T4TOTAL, T3FREE, THYROIDAB in the last 72 hours.  Invalid input(s): FREET3  Cardiac Enzymes  Recent Labs Lab 03/13/17 0841 03/14/17 0426  TROPONINI 0.03* 0.03*   ------------------------------------------------------------------------------------------------------------------ Invalid input(s): POCBNP ---------------------------------------------------------------------------------------------------------------  RADIOLOGY: No results found.  EKG:  Orders placed or performed during the hospital encounter of 03/13/17  . ED EKG  . ED EKG  . ED EKG  . ED EKG  . EKG 12-Lead  . EKG 12-Lead  . EKG 12-Lead  . EKG 12-Lead    ASSESSMENT AND PLAN:  Active Problems:   Sepsis (HCC)  #1. Sepsis due to acute bronchitis, continue. Zithromax  therapy,. Discontinue Zosyn, since patient's pro-calcitonin is less than 0.1, Awaiting for sputum culture #2. MRSA carrier, continue mupirocin intranasally  #3. Hypotension, resolved #4acute on chronic combined systolic and diastolic CHF, continue Lasix, potassium  supplements, Cozaar, , Coreg, advance Coreg, appreciate cardiology's input, no further workup is recommended in the hospital, echocardiogram today revealed ejection fraction of 25-30%, diastolic dysfunction, severe mitral regurgitation #5 Hyperglycemia, hemoglobin A1c was 5.1, no diabetes  #6. Demand ischemia, continue Coreg, Cozaar, aspirin, Lovenox, appreciate  cardiologist input, felt that patient's symptoms are related to IV fluid overload in the setting of COPD exacerbation/hypotension to nitroglycerin, recommended to continue Lasix, COPD therapy, stress test once pulmonary status improves as outpatient. Advance losartan as tolerated  . #7. Hypokalemia, resolved #8. Lactic acidosis, resolved  #9 anemia, stable #10. Pyuria, concerning for rash and infection, urine cultures revealed Escherichia coli and enterococcus cesium, suspect contamination, patient received Zosyn for the past 2-1/2-3 days, she is asymptomatic, follow closely clinically, Zosyn is stopped, follow culture results   Management plans discussed with the patient, family and they are in agreement.   DRUG ALLERGIES:  Allergies  Allergen Reactions  . Sulfa Antibiotics Itching    CODE STATUS:     Code Status Orders        Start     Ordered   03/13/17 1206  Full code  Continuous     03/13/17 1205    Code Status History    Date Active Date Inactive Code Status Order ID Comments User Context   06/03/2016 10:45 AM 06/05/2016  4:30 PM Full Code [id number]  Shaune Pollack, MD Inpatient      TOTAL TIME TAKING CARE OF THIS PATIENT: 35 minutes.    Katharina Caper M.D on 03/15/2017 at 1:38 PM  Between 7am to 6pm - Pager - [phone]  After 6pm go  to [email or web address] - password [email or web address]  [facility]  Office  [phone]  CC: Primary care physician; Patient, No Pcp Per

## 2017-03-15 NOTE — Progress Notes (Signed)
CCMD reports an 8 beat run of v-tach. Pt asleep with no distress noted. MD Pyreddy aware. No new orders. Will continue to monitor.

## 2017-03-15 NOTE — Discharge Instructions (Signed)
[facility] appointment on March 23 2017 at 10:00am with Clarisa Kindred, FNP. Please call [phone] to reschedule.

## 2017-03-15 NOTE — Progress Notes (Signed)
*  PRELIMINARY RESULTS* Echocardiogram 2D Echocardiogram has been performed.  Cristela Blue 03/15/2017, 9:55 AM

## 2017-03-15 NOTE — Plan of Care (Signed)
Problem: Safety: Goal: Ability to remain free from injury will improve Outcome: Progressing Bed alarm on, hourly rounding completed on patient, non skid socks on, call light within reach. Patient educated to call for assistance. Will continue to monitor.   Problem: Pain Managment: Goal: General experience of comfort will improve Outcome: Not Progressing Patient continues to c/o pain. Pain medications given per MAR. Will continue to assess.   Problem: Fluid Volume: Goal: Ability to maintain a balanced intake and output will improve Outcome: Progressing Patient diuresing well. See I/Os.

## 2017-03-16 LAB — EXPECTORATED SPUTUM ASSESSMENT W GRAM STAIN, RFLX TO RESP C

## 2017-03-16 LAB — URINE CULTURE: Culture: 30000 — AB

## 2017-03-16 LAB — EXPECTORATED SPUTUM ASSESSMENT W REFEX TO RESP CULTURE

## 2017-03-16 LAB — PROCALCITONIN

## 2017-03-16 MED ORDER — AZITHROMYCIN 250 MG PO TABS
250.0000 mg | ORAL_TABLET | Freq: Every day | ORAL | Status: AC
  Administered 2017-03-17: 250 mg via ORAL
  Filled 2017-03-16: qty 1

## 2017-03-16 MED ORDER — IPRATROPIUM-ALBUTEROL 0.5-2.5 (3) MG/3ML IN SOLN: 3.0000 mL | Freq: Four times a day (QID) | RESPIRATORY_TRACT | Status: DC | PRN

## 2017-03-16 MED ORDER — PREDNISONE 50 MG PO TABS
50.0000 mg | ORAL_TABLET | Freq: Every day | ORAL | Status: DC
Start: 2017-03-17 — End: 2017-03-18
  Administered 2017-03-17 – 2017-03-18 (×2): 50 mg via ORAL
  Filled 2017-03-16 (×2): qty 1

## 2017-03-16 MED ORDER — TROLAMINE SALICYLATE 10 % EX CREA
TOPICAL_CREAM | CUTANEOUS | Status: DC | PRN
  Filled 2017-03-16: qty 85

## 2017-03-16 MED ORDER — IPRATROPIUM-ALBUTEROL 0.5-2.5 (3) MG/3ML IN SOLN
3.0000 mL | Freq: Three times a day (TID) | RESPIRATORY_TRACT | Status: DC
  Administered 2017-03-16 – 2017-03-18 (×6): 3 mL via RESPIRATORY_TRACT
  Filled 2017-03-16 (×6): qty 3

## 2017-03-16 MED ORDER — HYDROCOD POLST-CPM POLST ER 10-8 MG/5ML PO SUER
5.0000 mL | Freq: Two times a day (BID) | ORAL | Status: DC
  Administered 2017-03-16 – 2017-03-18 (×5): 5 mL via ORAL
  Filled 2017-03-16 (×5): qty 5

## 2017-03-16 MED ORDER — METHYLPREDNISOLONE SODIUM SUCC 40 MG IJ SOLR: 40.0000 mg | Freq: Every day | INTRAMUSCULAR | Status: DC

## 2017-03-16 MED ORDER — CARVEDILOL 25 MG PO TABS
25.0000 mg | ORAL_TABLET | Freq: Two times a day (BID) | ORAL | Status: DC
  Administered 2017-03-16 – 2017-03-18 (×5): 25 mg via ORAL
  Filled 2017-03-16 (×5): qty 1

## 2017-03-16 NOTE — Care Management (Signed)
Patient resides with her son and daughter in law.  Currently receiving home health SN OT OT Aide and SW through [facility].  Her PCP is Matilde Sprang D[place] [phone].  Patient says that she has a walker, BSC and wheelchair at home.  Current address is [address] [place]. Patient says she does not want to consider any facility placement " and my family will support this- call them."

## 2017-03-16 NOTE — Progress Notes (Addendum)
PT Cancellation Note  Patient Details Name: [name] [name] MRN: [id number] DOB: 09-20-1945   Cancelled Treatment:    Reason Eval/Treat Not Completed: Patient declined, no reason specified (Consult received and chart reviewed.  Patient currently refusing participation with evaluation.  Educated on benefits of mobility, offered OOB to chair; refused all attempts, requesting therapist return next date.  Will continue efforts as appropriate.)   Of note, patient reports living with son in [place] with 4 steps (bilat rails, too wide to hold both) to enter.  Ambulatory with 4WRW for household distances; home O2 at 3L.  Denies recent fall history.   Veronique Warga H. Manson Passey, PT, DPT, NCS 03/16/17, 4:16 PM [phone]

## 2017-03-16 NOTE — Progress Notes (Signed)
[facility] - [place] at [facility]   PATIENT NAME: [name] [name]    MR#:  [id number]  DATE OF BIRTH:  [birth year]  SUBJECTIVE:  CHIEF COMPLAINT:   Chief Complaint  Patient presents with  . Shortness of Breath   The patient is 71 year old Caucasian female with past medical history significant for history ofCHF, COPD, hypertension, lung nodule, who presents to the hospital with complaints of shortness of breath, cough, wheezing, worsening over the past few days.labs on arrival to the hospital revealed acidosis with pH of 7.22, PCO2 level of 70, hyperglycemia, elevated beta-type natruretic peptide, elevated troponin,lactic acid level to 5.7, leukocytosis. chest x-ray revealed interstitial abnormalities, concerning for CHF, COPD. Patient is of some shortness of breath and chest tightness. Complains of some cough, green phlegm production. MRSA PCR was positive. Patient is on zosyn and Zithromax at present. White blood cell count is normal. Blood pressure has improved to 150. The patient was given Lasix yesterday, however, remains on r 3 L of oxygen through nasal cannula.. She feels comfortable today. Denies any significant shortness of breath. Intermittent SVT.  The patient was seen by cardiologist, no further workup was recommended in the hospital, but echocardiogram., which revealed ejection fraction of 25-30%, diastolic dysfunction, severe mitral regurgitation . The patient was diuresed, net negative about approximately 2.8 L, she feels more comfortable, still complains of some cough and phlegm production, which is brown in color. The patient is afebrile.    Review of Systems  Constitutional: Negative for chills, fever and weight loss.  HENT: Negative for congestion.   Eyes: Negative for blurred vision and double vision.  Respiratory: Positive for cough, sputum production, shortness of breath and wheezing.   Cardiovascular: Negative for chest pain, palpitations,  orthopnea, leg swelling and PND.  Gastrointestinal: Negative for abdominal pain, blood in stool, constipation, diarrhea, nausea and vomiting.  Genitourinary: Negative for dysuria, frequency, hematuria and urgency.  Musculoskeletal: Negative for falls.  Neurological: Positive for headaches. Negative for dizziness, tremors and focal weakness.  Endo/Heme/Allergies: Does not bruise/bleed easily.  Psychiatric/Behavioral: Negative for depression. The patient does not have insomnia.     VITAL SIGNS: Blood pressure (!) 123/45, pulse (!) 121, temperature 98.4 F (36.9 C), resp. rate 18, height 5\' 2"  (1.575 m), weight 71.9 kg (158 lb 8.2 oz), SpO2 100 %.  PHYSICAL EXAMINATION:   GENERAL:  71 y.o.-year-old patient lying in the bed in mild respiratory distress but more comfortable today  EYES: Pupils equal, round, reactive to light and accommodation. No scleral icterus. Extraocular muscles intact.  HEENT: Head atraumatic, normocephalic. Oropharynx and nasopharynx clear.  NECK:  Supple, no jugular venous distention. No thyroid enlargement, no tenderness.  LUNGS: Better air entrance bilaterally, few scattered wheezes, no  rales,rhonchi or crepitations .  Intermittent use of accessory muscles of respiration.  CARDIOVASCULAR: S1, S2 normal. No murmurs, rubs, or gallops.  ABDOMEN: Soft, nontender, nondistended. Bowel sounds present. No organomegaly or mass.  EXTREMITIES: No pedal edema, cyanosis, or clubbing.  NEUROLOGIC: Cranial nerves II through XII are intact. Muscle strength 5/5 in all extremities. Sensation intact. Gait not checked.  PSYCHIATRIC: The patient is alert and oriented x 3.  SKIN: No obvious rash, lesion, or ulcer.   ORDERS/RESULTS REVIEWED:   CBC  Recent Labs Lab 03/13/17 0841 03/13/17 1227 03/14/17 0426  WBC 13.2* 9.7 5.0  HGB 12.1 11.6* 10.6*  HCT 36.8 35.2 32.1*  PLT 274 182 166  MCV 90.4 90.0 89.5  MCH 29.6 29.7 29.5  MCHC  32.8 33.0 32.9  RDW 16.7* 16.2* 16.5*    LYMPHSABS 5.4*  --   --   MONOABS 0.6  --   --   EOSABS 0.2  --   --   BASOSABS 0.0  --   --    ------------------------------------------------------------------------------------------------------------------  Chemistries   Recent Labs Lab 03/13/17 0841 03/13/17 1227 03/14/17 0426 03/15/17 0552  NA 143  --  143  --   K 4.1  --  3.3* 3.7  CL 105  --  105  --   CO2 25  --  27  --   GLUCOSE 268*  --  146*  --   BUN 16  --  16  --   CREATININE 0.97 0.89 0.78  --   CALCIUM 8.6*  --  8.0*  --   MG  --   --   --  1.8  AST 23  --  18  --   ALT 10*  --  14  --   ALKPHOS 59  --  44  --   BILITOT 0.7  --  0.5  --    ------------------------------------------------------------------------------------------------------------------ estimated creatinine clearance is 58.1 mL/min (by C-G formula based on SCr of 0.78 mg/dL). ------------------------------------------------------------------------------------------------------------------ No results for input(s): TSH, T4TOTAL, T3FREE, THYROIDAB in the last 72 hours.  Invalid input(s): FREET3  Cardiac Enzymes  Recent Labs Lab 03/13/17 0841 03/14/17 0426  TROPONINI 0.03* 0.03*   ------------------------------------------------------------------------------------------------------------------ Invalid input(s): POCBNP ---------------------------------------------------------------------------------------------------------------  RADIOLOGY: No results found.  EKG:  Orders placed or performed during the hospital encounter of 03/13/17  . ED EKG  . ED EKG  . ED EKG  . ED EKG  . EKG 12-Lead  . EKG 12-Lead  . EKG 12-Lead  . EKG 12-Lead    ASSESSMENT AND PLAN:  Active Problems:   Sepsis (HCC)  #1. Sepsis due to acute bronchitis, continue. Zithromax  therapy,.  Now off Zosyn, since patient's pro-calcitonin was less than 0.1, Awaiting for sputum culture, patient is afebrile  #2. MRSA carrier, continue mupirocin intranasally   #3. Hypotension, resolved #4acute on chronic combined systolic and diastolic CHF, continue Lasix intravenously, net negative approximately 2.3 L since admission , potassium supplements,  continue advanced doses of Cozaar and  Coreg,  appreciate cardiology's input, no further workup is recommended in the hospital, echocardiogram today revealed ejection fraction of 25-30%, diastolic dysfunction, severe mitral regurgitation. Patient is on 3 L of oxygen through nasal cannula, O2 sats were 99-100%, she is on 2-3 L at home  #5 Hyperglycemia, hemoglobin A1c was 5.1, no diabetes  #6. Demand ischemia, continue Coreg, Cozaar, aspirin, Lovenox, appreciate  cardiologist input, felt that patient's symptoms are related to fluid overload in the setting of COPD exacerbation/hypotension to nitroglycerin, recommended to continue Lasix, COPD therapy, stress test once pulmonary status improves as outpatient. Advanced losartan, coreg  #7. Hypokalemia, resolved #8. Lactic acidosis, resolved  #9 anemia, stable #10. Pyuria, concerning for urinary tract infection, urine cultures revealed Escherichia coli and enterococcus cesium, suspect contamination, patient received Zosyn for the past 2-1/2-3 days, she is asymptomatic, follow closely clinically   Management plans discussed with the patient, family and they are in agreement.   DRUG ALLERGIES:  Allergies  Allergen Reactions  . Sulfa Antibiotics Itching    CODE STATUS:     Code Status Orders        Start     Ordered   03/13/17 1206  Full code  Continuous     03/13/17 1205    Code  Status History    Date Active Date Inactive Code Status Order ID Comments User Context   06/03/2016 10:45 AM 06/05/2016  4:30 PM Full Code [id number]  Shaune Pollack, MD Inpatient      TOTAL TIME TAKING CARE OF THIS PATIENT: 30 minutes.    Katharina Caper M.D on 03/16/2017 at 2:45 PM  Between 7am to 6pm - Pager - [phone]  After 6pm go to [email or web address] - password [email or web address]  [email or web address]  [facility]  Office  [phone]  CC: Primary care physician; Patient, No Pcp Per

## 2017-03-16 NOTE — Progress Notes (Signed)
CCMD reports a increase in heat rate to 152 for a couple of seconds. Pt is sleeping and has no noted distress. MD Hugelmeyer made aware. No new orders. Will continue to monitor.

## 2017-03-17 DIAGNOSIS — J441 Chronic obstructive pulmonary disease with (acute) exacerbation: Secondary | ICD-10-CM

## 2017-03-17 DIAGNOSIS — I5023 Acute on chronic systolic (congestive) heart failure: Secondary | ICD-10-CM

## 2017-03-17 LAB — BASIC METABOLIC PANEL
ANION GAP: 9 (ref 5–15)
BUN: 32 mg/dL — ABNORMAL HIGH (ref 6–20)
CALCIUM: 7.8 mg/dL — AB (ref 8.9–10.3)
CHLORIDE: 99 mmol/L — AB (ref 101–111)
CO2: 30 mmol/L (ref 22–32)
Creatinine, Ser: 0.84 mg/dL (ref 0.44–1.00)
GFR calc non Af Amer: >60 mL/min (ref >60)
GLUCOSE: 106 mg/dL — AB (ref 65–99)
POTASSIUM: 3.7 mmol/L (ref 3.5–5.1)
Sodium: 138 mmol/L (ref 135–145)

## 2017-03-17 MED ORDER — MAGNESIUM SULFATE IN D5W 1-5 GM/100ML-% IV SOLN
1.0000 g | Freq: Once | INTRAVENOUS | Status: AC
  Administered 2017-03-17: 1 g via INTRAVENOUS
  Filled 2017-03-17: qty 100

## 2017-03-17 MED ORDER — AMIODARONE HCL 200 MG PO TABS
400.0000 mg | ORAL_TABLET | Freq: Two times a day (BID) | ORAL | Status: DC
  Administered 2017-03-17 – 2017-03-18 (×3): 400 mg via ORAL
  Filled 2017-03-17 (×3): qty 2

## 2017-03-17 MED ORDER — POTASSIUM CHLORIDE CRYS ER 20 MEQ PO TBCR
40.0000 meq | EXTENDED_RELEASE_TABLET | Freq: Once | ORAL | Status: AC
  Administered 2017-03-17: 40 meq via ORAL
  Filled 2017-03-17: qty 2

## 2017-03-17 NOTE — Progress Notes (Signed)
[facility] - [place] at [facility]   PATIENT NAME: [name] [name]    MR#:  [id number]  DATE OF BIRTH:  Jul 29, 1945  SUBJECTIVE:  CHIEF COMPLAINT:   Chief Complaint  Patient presents with  . Shortness of Breath   The patient is 71 year old Caucasian female with past medical history significant for history ofCHF, COPD, hypertension, lung nodule, who presents to the hospital with complaints of shortness of breath, cough, wheezing, worsening over the past few days.labs on arrival to the hospital revealed acidosis with pH of 7.22, PCO2 level of 70, hyperglycemia, elevated beta-type natruretic peptide, elevated troponin,lactic acid level to 5.7, leukocytosis. chest x-ray revealed interstitial abnormalities, concerning for CHF, COPD. Patient is of some shortness of breath and chest tightness. Complains of some cough, green phlegm production. MRSA PCR was positive. Patient is on zosyn and Zithromax at present. White blood cell count is normal. Blood pressure has improved to 150. The patient was given Lasix yesterday, however, remains on r 3 L of oxygen through nasal cannula.. She feels comfortable today. Denies any significant shortness of breath. Intermittent SVT.  The patient was seen by cardiologist, no further workup was recommended in the hospital, but echocardiogram., which revealed ejection fraction of 25-30%, diastolic dysfunction, severe mitral regurgitation . The patient was diuresed, net negative about approximately 5.0 L, she feels more comfortable, still complains of some cough and phlegm production, which is Yellow in color. The patient is afebrile. She was noted to have V. tach of 15 beats earlier today, symptomatic, had chest pain, some shortness of breath with it, but did not feel like passing out. Cardiologist recommended to initiate patient on amiodarone, proceed to review. [facility] ischemic workup  Review of Systems  Constitutional: Negative for chills, fever and  weight loss.  HENT: Negative for congestion.   Eyes: Negative for blurred vision and double vision.  Respiratory: Positive for cough, sputum production, shortness of breath and wheezing.   Cardiovascular: Negative for chest pain, palpitations, orthopnea, leg swelling and PND.  Gastrointestinal: Negative for abdominal pain, blood in stool, constipation, diarrhea, nausea and vomiting.  Genitourinary: Negative for dysuria, frequency, hematuria and urgency.  Musculoskeletal: Negative for falls.  Neurological: Positive for headaches. Negative for dizziness, tremors and focal weakness.  Endo/Heme/Allergies: Does not bruise/bleed easily.  Psychiatric/Behavioral: Negative for depression. The patient does not have insomnia.     VITAL SIGNS: Blood pressure (!) 148/79, pulse 74, temperature 98 F (36.7 C), temperature source Oral, resp. rate 18, height 5\' 2"  (1.575 m), weight 71.9 kg (158 lb 8.2 oz), SpO2 100 %.  PHYSICAL EXAMINATION:   GENERAL:  71 y.o.-year-old patient lying in the bed in mild respiratory distress but more comfortable today  EYES: Pupils equal, round, reactive to light and accommodation. No scleral icterus. Extraocular muscles intact.  HEENT: Head atraumatic, normocephalic. Oropharynx and nasopharynx clear.  NECK:  Supple, no jugular venous distention. No thyroid enlargement, no tenderness.  LUNGS: Better air entrance bilaterally, few scattered wheezes, no  rales,rhonchi or crepitations .  Intermittent use of accessory muscles of respiration.  CARDIOVASCULAR: S1, S2 normal. No murmurs, rubs, or gallops.  ABDOMEN: Soft, nontender, nondistended. Bowel sounds present. No organomegaly or mass.  EXTREMITIES: No pedal edema, cyanosis, or clubbing.  NEUROLOGIC: Cranial nerves II through XII are intact. Muscle strength 5/5 in all extremities. Sensation intact. Gait not checked.  PSYCHIATRIC: The patient is alert and oriented x 3.  SKIN: No obvious rash, lesion, or ulcer.    ORDERS/RESULTS REVIEWED:  CBC  Recent Labs Lab 03/13/17 0841 03/13/17 1227 03/14/17 0426  WBC 13.2* 9.7 5.0  HGB 12.1 11.6* 10.6*  HCT 36.8 35.2 32.1*  PLT 274 182 166  MCV 90.4 90.0 89.5  MCH 29.6 29.7 29.5  MCHC 32.8 33.0 32.9  RDW 16.7* 16.2* 16.5*  LYMPHSABS 5.4*  --   --   MONOABS 0.6  --   --   EOSABS 0.2  --   --   BASOSABS 0.0  --   --    ------------------------------------------------------------------------------------------------------------------  Chemistries   Recent Labs Lab 03/13/17 0841 03/13/17 1227 03/14/17 0426 03/15/17 0552 03/17/17 0541  NA 143  --  143  --  138  K 4.1  --  3.3* 3.7 3.7  CL 105  --  105  --  99*  CO2 25  --  27  --  30  GLUCOSE 268*  --  146*  --  106*  BUN 16  --  16  --  32*  CREATININE 0.97 0.89 0.78  --  0.84  CALCIUM 8.6*  --  8.0*  --  7.8*  MG  --   --   --  1.8  --   AST 23  --  18  --   --   ALT 10*  --  14  --   --   ALKPHOS 59  --  44  --   --   BILITOT 0.7  --  0.5  --   --    ------------------------------------------------------------------------------------------------------------------ estimated creatinine clearance is 55.4 mL/min (by C-G formula based on SCr of 0.84 mg/dL). ------------------------------------------------------------------------------------------------------------------ No results for input(s): TSH, T4TOTAL, T3FREE, THYROIDAB in the last 72 hours.  Invalid input(s): FREET3  Cardiac Enzymes  Recent Labs Lab 03/13/17 0841 03/14/17 0426  TROPONINI 0.03* 0.03*   ------------------------------------------------------------------------------------------------------------------ Invalid input(s): POCBNP ---------------------------------------------------------------------------------------------------------------  RADIOLOGY: No results found.  EKG:  Orders placed or performed during the hospital encounter of 03/13/17  . ED EKG  . ED EKG  . ED EKG  . ED EKG  . EKG 12-Lead  .  EKG 12-Lead  . EKG 12-Lead  . EKG 12-Lead    ASSESSMENT AND PLAN:  Active Problems:   Sepsis (HCC)  #1. Sepsis due to acute bronchitis,  Completed Zithromax  Therapy. Pro-Calcitonin was negative Awaiting for sputum culture, patient is afebrile  #2. MRSA carrier, continue mupirocin intranasally  #3. Hypotension, resolved #4acute on chronic combined systolic and diastolic CHF, continue Lasix intravenously, net negative approximately 5.0 L since admission , potassium supplements,  continue advanced doses of Cozaar and  Coreg,  appreciate cardiology's input, no further workup is recommended in the hospital, echocardiogram today revealed ejection fraction of 25-30%, diastolic dysfunction, severe mitral regurgitation. Patient is on 3 L of oxygen through nasal cannula, O2 sats were 100%, she is on 2-3 L at home  #5 Hyperglycemia, hemoglobin A1c was 5.1, no diabetes  #6. Demand ischemia, continue Coreg, Cozaar, aspirin, Lovenox, appreciate  cardiologist input, felt that patient's symptoms are related to fluid overload in the setting of COPD exacerbation/hypotension to nitroglycerin, recommended to continue Lasix, COPD therapy, cardiologist is to follow up patient as outpatient and review ischemic workup at [facility], initially recommended stress test once pulmonary status improves as outpatient. Continue advanced losartan, coreg , may need to be advanced even higher. #7. Hypokalemia, resolved #8. Lactic acidosis, resolved  #9 anemia, stable #10. Pyuria, concerning for urinary tract infection, urine cultures revealed Escherichia coli and enterococcus fecium, 30,000 colony forming units and  10,000 colony-forming units, respectively, suspected contamination, patient received Zosyn for only 2-1/2-3 days, she is asymptomatic, follow closely clinically #11. V. tach, initiated on amiodarone, to be continued 400 mg twice daily for 1 week, then tapering doses, discussed with Dr. Lewie Loron earlier today  Management plans  discussed with the patient, family and they are in agreement.   DRUG ALLERGIES:  Allergies  Allergen Reactions  . Sulfa Antibiotics Itching    CODE STATUS:     Code Status Orders        Start     Ordered   03/13/17 1206  Full code  Continuous     03/13/17 1205    Code Status History    Date Active Date Inactive Code Status Order ID Comments User Context   06/03/2016 10:45 AM 06/05/2016  4:30 PM Full Code [id number]  Shaune Pollack, MD Inpatient      TOTAL TIME TAKING CARE OF THIS PATIENT: 40 minutes.    Katharina Caper M.D on 03/17/2017 at 1:28 PM  Between 7am to 6pm - Pager - [phone]  After 6pm go to [email or web address] - password [email or web address]  [facility]  Office  [phone]  CC: Primary care physician; Patient, No Pcp Per

## 2017-03-17 NOTE — Evaluation (Signed)
Physical Therapy Evaluation Patient Details Name: [name] [name] MRN: [id number] DOB: [birth year] Today's Date: 03/17/2017   History of Present Illness  presented to ER secondary to SOB, cough and wheezing; admitted with sepsis related to bronchitis, acute respiratory failure related to COPD/bronchitis.  Clinical Impression  Upon evaluation, patient alert and oriented; generally displeased with therapist presence and request for mobility assessment, but agreeable to participation with mod encouragement.  Refused formal participation with strength/ROM assessment; however, appears generally functional for basic transfers and mobility (due question some degree of deficit with R shoulder elevation due to history of injury).  Able to complete bed mobility with mod indep; sit/stand, basic transfers and gait (15' x2) without assist device, cga.  Consistently reaching for walls/furniture for external stabilization due to poor LE balance reactions; poor ability to shift/maintain weight outside immediate BOS (requiring contralateral UE stabilization with any functional reaching).  All indicative of balance deficits, increased fall risk.  Do recommend use of RW for all mobility for balance, safety and overall energy conservation; patient acknowledges need at times, but refuses use during session. Sats >98% on 3L throughout session; mod SOB with minimal activity. Would benefit from skilled PT to address above deficits and promote optimal return to PLOF; Recommend transition to HHPT upon discharge from acute hospitalization.     Follow Up Recommendations Home health PT    Equipment Recommendations   (has 4WRW at home)    Recommendations for Other Services       Precautions / Restrictions Precautions Precautions: Fall Restrictions Weight Bearing Restrictions: No      Mobility  Bed Mobility Overal bed mobility: Modified Independent                Transfers Overall transfer level: Needs  assistance Equipment used: None Transfers: Sit to/from Stand Sit to Stand: Min guard         General transfer comment: refuses assist/guarding from therapist; does require UE support to complete  Ambulation/Gait Ambulation/Gait assistance: Min guard Ambulation Distance (Feet): 15 Feet (x2) Assistive device: None       General Gait Details: fair step height/length, but guarded gait performance; requiring UE support on furniture/walls for external stabilization.  Poor ankle/hip balance strategies evident (mild sway with obstacle negotiation, changes of direction), relying heavily on UE support for recovery.  Refuses use of RW at this time despite encouragement from therapist  Stairs            Wheelchair Mobility    Modified Rankin (Stroke Patients Only)       Balance Overall balance assessment: Needs assistance Sitting-balance support: No upper extremity supported;Feet supported Sitting balance-Leahy Scale: Good     Standing balance support: No upper extremity supported Standing balance-Leahy Scale: Poor                               Pertinent Vitals/Pain Pain Assessment: No/denies pain    Home Living Family/patient expects to be discharged to:: Private residence Living Arrangements: Children Available Help at Discharge: Family;Available 24 hours/day Type of Home: Mobile home Home Access: Stairs to enter Entrance Stairs-Rails: Doctor, general practice of Steps: 4 Home Layout: One level Home Equipment: Walker - 4 wheels      Prior Function Level of Independence: Needs assistance         Comments: Family assists as needed with ADLs and household chorses, but generally mod indep for basic, household mobility; limited community access/mobility.  Home O2  at 3L.       Hand Dominance        Extremity/Trunk Assessment   Upper Extremity Assessment Upper Extremity Assessment: Overall WFL for tasks assessed (refused participation  with formal ROM/MMT; appears grossly [facility] for basic transfers/mobility.  Do question some degree of R shoulder elevaation deficits-history of R shoulder/clavicular injury reported)    Lower Extremity Assessment Lower Extremity Assessment: Generalized weakness (refused participation with formal ROM/MMT; appears grossly at least 4-/5.  Does endorse some 'tingling' in toes of bilat feet (Baseline for her))       Communication   Communication: HOH  Cognition Arousal/Alertness: Awake/alert Behavior During Therapy: Agitated;Impulsive Overall Cognitive Status: Within Functional Limits for tasks assessed                                        General Comments      Exercises Other Exercises Other Exercises: Toilet transfer, ambulatory without assist device, cga; sit/stand from standard toilet, cga with grab bar; static stance at sink for hand hygiene, cga/close sup.  limited functional reach evident; requires contralateral UE support for weight shift/movement outside immediate BOS (indicative of increased fall risk).   Assessment/Plan    PT Assessment Patient needs continued PT services  PT Problem List Decreased strength;Decreased activity tolerance;Decreased balance;Decreased mobility;Cardiopulmonary status limiting activity       PT Treatment Interventions DME instruction;Gait training;Stair training;Functional mobility training;Therapeutic exercise;Therapeutic activities;Balance training;Patient/family education    PT Goals (Current goals can be found in the Care Plan section)  Acute Rehab PT Goals Patient Stated Goal: to return home PT Goal Formulation: With patient Time For Goal Achievement: 03/31/17 Potential to Achieve Goals: Fair    Frequency Min 2X/week   Barriers to discharge Decreased caregiver support      Co-evaluation               AM-PAC PT "6 Clicks" Daily Activity  Outcome Measure Difficulty turning over in bed (including adjusting  bedclothes, sheets and blankets)?: None Difficulty moving from lying on back to sitting on the side of the bed? : None Difficulty sitting down on and standing up from a chair with arms (e.g., wheelchair, bedside commode, etc,.)?: A Little Help needed moving to and from a bed to chair (including a wheelchair)?: A Little Help needed walking in hospital room?: A Little Help needed climbing 3-5 steps with a railing? : A Little 6 Click Score: 20    End of Session Equipment Utilized During Treatment: Oxygen Activity Tolerance:  (limited by patient willingness to participate) Patient left: in chair;with call bell/phone within reach;with chair alarm set Nurse Communication: Mobility status PT Visit Diagnosis: Muscle weakness (generalized) (M62.81);Difficulty in walking, not elsewhere classified (R26.2)    Time: 9937-1696 PT Time Calculation (min) (ACUTE ONLY): 20 min   Charges:   PT Evaluation $PT Eval Low Complexity: 1 Low PT Treatments $Therapeutic Activity: 8-22 mins   PT G Codes:   PT G-Codes **NOT FOR INPATIENT CLASS** Functional Assessment Tool Used: AM-PAC 6 Clicks Basic Mobility Functional Limitation: Mobility: Walking and moving around Mobility: Walking and Moving Around Current Status (V8938): At least 40 percent but less than 60 percent impaired, limited or restricted Mobility: Walking and Moving Around Goal Status [phone]): At least 1 percent but less than 20 percent impaired, limited or restricted    Lanette Ell H. Manson Passey, PT, DPT, NCS 03/17/17, 3:45 PM [phone]

## 2017-03-17 NOTE — Progress Notes (Signed)
Patient Name: [name] [name] Date of Encounter: 03/17/2017  Primary Cardiologist: New to [facility] - consult by [facility] Problem List     Active Problems:   Sepsis (HCC)     Subjective   TTE showed known CM with EF 25-30% (stable when compared to prior study). Patient and family indicate a 14 day admission to [facility] in November with LHC being done at that time. No record of her being at [facility] in Care Everywhere. 11 beats of NSVT overnight, asymptomatic. Potassium remains mildly low at 3.7. Renal function stable. Remains SOB, though near her baseline. Previously on Hospice, graduated. Wants to be a full code at this time.   Inpatient Medications    Scheduled Meds: . albuterol  5 mg Nebulization Once  . aspirin  81 mg Oral Daily  . azithromycin  250 mg Oral Daily  . carvedilol  25 mg Oral BID WC  . Chlorhexidine Gluconate Cloth  6 each Topical Q0600  . chlorpheniramine-HYDROcodone  5 mL Oral Q12H  . citalopram  20 mg Oral Daily  . DULoxetine  30 mg Oral Daily  . enoxaparin (LOVENOX) injection  40 mg Subcutaneous Q24H  . furosemide  40 mg Intravenous BID  . ipratropium-albuterol  3 mL Nebulization TID  . losartan  25 mg Oral BID  . mouth rinse  15 mL Mouth Rinse BID  . mupirocin ointment  1 application Nasal BID  . nitroGLYCERIN  0.5 inch Topical Q6H  . pantoprazole  40 mg Oral Daily  . potassium chloride  20 mEq Oral Daily  . potassium chloride  40 mEq Oral Once  . predniSONE  50 mg Oral Q breakfast   Continuous Infusions: . magnesium sulfate 1 - 4 g bolus IVPB     PRN Meds: acetaminophen **OR** acetaminophen, ipratropium-albuterol, ondansetron **OR** ondansetron (ZOFRAN) IV, traMADol-acetaminophen, trolamine salicylate   Vital Signs    Vitals:   03/16/17 2002 03/16/17 2043 03/17/17 0323 03/17/17 0727  BP:  (!) 146/84 (!) 148/79   Pulse:  89 74   Resp:  18 18   Temp:  98.3 F (36.8 C) 98 F (36.7 C)   TempSrc:  Oral Oral   SpO2: 100% 100% 98% 100%  Weight:       Height:        Intake/Output Summary (Last 24 hours) at 03/17/17 1022 Last data filed at 03/17/17 0327  Gross per 24 hour  Intake                0 ml  Output             1300 ml  Net            -1300 ml   Filed Weights   03/13/17 1207  Weight: 158 lb 8.2 oz (71.9 kg)    Physical Exam    GEN: Frail and elderly appearing, in no acute distress. HOH. HEENT: Grossly normal.  Neck: Supple, no JVD, carotid bruits, or masses. Cardiac: RRR, no murmurs, rubs, or gallops. No clubbing, cyanosis, edema.  Radials/DP/PT 2+ and equal bilaterally.  Respiratory:  Diminished, coarse breath sounds bilaterally. GI: Soft, nontender, nondistended, BS + x 4. MS: no deformity or atrophy. Skin: warm and dry, no rash. Neuro:  Strength and sensation are intact. Psych: AAOx3.  Normal affect.  Labs    CBC No results for input(s): WBC, NEUTROABS, HGB, HCT, MCV, PLT in the last 72 hours. Basic Metabolic Panel  Recent Labs  03/15/17 0552 03/17/17  0541  NA  --  138  K 3.7 3.7  CL  --  99*  CO2  --  30  GLUCOSE  --  106*  BUN  --  32*  CREATININE  --  0.84  CALCIUM  --  7.8*  MG 1.8  --    Liver Function Tests No results for input(s): AST, ALT, ALKPHOS, BILITOT, PROT, ALBUMIN in the last 72 hours. No results for input(s): LIPASE, AMYLASE in the last 72 hours. Cardiac Enzymes No results for input(s): CKTOTAL, CKMB, CKMBINDEX, TROPONINI in the last 72 hours. BNP Invalid input(s): POCBNP D-Dimer No results for input(s): DDIMER in the last 72 hours. Hemoglobin A1C No results for input(s): HGBA1C in the last 72 hours. Fasting Lipid Panel No results for input(s): CHOL, HDL, LDLCALC, TRIG, CHOLHDL, LDLDIRECT in the last 72 hours. Thyroid Function Tests No results for input(s): TSH, T4TOTAL, T3FREE, THYROIDAB in the last 72 hours.  Invalid input(s): FREET3  Telemetry    NSR with possible runs of Afib vs atrial tach, frequent PVCs with 11 beats of NSVT overnight - Personally  Reviewed  ECG    n/a - Personally Reviewed  Radiology    No results found.  Cardiac Studies   TTE 03/15/2017: Study Conclusions  - Left ventricle: The cavity size was moderately dilated. There was   mild concentric hypertrophy. Systolic function was severely   reduced. The estimated ejection fraction was in the range of 25%   to 30%. Features are consistent with a pseudonormal left   ventricular filling pattern, with concomitant abnormal relaxation   and increased filling pressure (grade 2 diastolic dysfunction). - Mitral valve: There was severe regurgitation. - Left atrium: The atrium was moderately dilated. - Right ventricle: The cavity size was mildly dilated. Wall   thickness was normal. - Right atrium: The atrium was mildly dilated.  Patient Profile     71 y.o. female with history of chronic respiratory failure 2/2 COPD on home oxygen with end-stage COPD, chronic systolic CHF, HTN, and pulmonary nodule who was previously on Hospice care presented to [facility] with acute on chronic respiratory failure with hypoxia.   Assessment & Plan    1. Acute on chornic respiratory failure with hypoxia: -Likely multifactorial including acute on chronic systolic CHF, exacerbation of end-stage COPD -Supplemental oxygen per IM  2. Acute on chronic systolic CHF: -She has diuresed well, documented 4.1 L for the admission with a full Foley container currently -Continue gentle diuresis with IV Lasix with KCl repletion  -Tolerating low-dose Coreg, will not titrate given end stage COPD -Continue losartan -Add spironolactone given CM -Strict Is and Os, daily weights   3. End stage COPD with exacerbation: -Per IM  4. CAD/demand ischemia: -Patient and family report patient having had a cardiac cath in 05/2016 at [facility]. There is no record of the patient being in the [facility] system -We have asked the patient to fill out a record release for faxing of possible records -[name] has been known since at  least the year prior -She was previously on Hospice -Could consider Lexiscan Myoview to evaluate for high-risk ischemia if we are unable to obtain outside hospital records with possible cardiac cath -Uncertain if she would even be a cardiac cath candidate given her severe illness, if not found to be a cath candidate, there is unlikely to be any reason to perform nuclear stress testing  5. Sepsis with AECOPD: -As above  6. NSVT/possible Afib: -Asymptomatic  -Start amiodarone 400 mg bid  x 1 week followed by 200 mg bid x 1 week, then 20 mg daily thereafter -Coreg as above -Potassium mildly low at 3.7, replete  -Magnesium mildly low at 1.8, replete  -Hold on full-dose anticoagulation at this time given frail state and acute illness  -Consider stopping albuterol and changing to [employer] [facility] Pager: [phone] 03/17/2017, 10:22 AM   Attending Note Patient seen and examined, agree with detailed note above,  Patient presentation and plan discussed on rounds.  EKG lab work, chest x-ray, echocardiogram reviewed independently by myself   reports breathing is slowly improving , still with significant cough   daughter-in-law at the bedside, previous history discussed with patient and family. Long history of cardiomyopathy presumed to be ischemic patient reports having admission at [facility] in 2017, records have been requested   previously on hospice, came off hospice as she was stable She has changed her status, prefers to be full code . Talk to son on the phone, he is aware  Telemetry reviewed personally by myself showing 11 beats nonsustained VT, also with short runs of narrow complex tachycardia frequent PVCs,  On physical exam coarse breath sounds bilaterally, unable to assess JVP, heart sounds regular with ectopy no murmurs appreciated, abdomen soft nontender, no significant lower extremity edema   Lab work reviewed showing creatinine 0.84, BUN  climbing  32  A/P 1. Acute on chornic respiratory failure with hypoxia: End-stage COPD, acute on chronic systolic CHF,  She may benefit from prolonged prednisone taper Currently on oxygen at home uses nebulizers at home  She may benefit from outpatient pulmonary follow-up   2. Acute on chronic systolic CHF: 4.1 L for the admission with a full Foley container currently -Continue gentle diuresis with IV Lasix with KCl repletion  low-dose Coreg,  losartan -Add spironolactone  Will likely need to change to oral Lasix tomorrow given climb in BUN  3. CAD/demand ischemia: -Patient and family report patient having had a cardiac cath in 05/2016 at [facility]. There is no record of the patient being in the [facility] system -We have asked the patient to fill out a record release for faxing of possible records Currently no plan for ischemia workup   previously on Hospice  5. NSVT/possible Afib/narrow complex tachycardia  -Start amiodarone 400 mg bid x 1 week followed by 200 mg bid x 1 week, then 20 mg daily thereafter -Coreg as above    long discussion with patient and daughter as well as son on the phone concerning the above  Greater than 50% was spent in counseling and coordination of care with patient Total encounter time 35 minutes or more  He is on very he has fair gain Signed: Dossie Arbour  M.D., Ph.D. [facility]

## 2017-03-17 NOTE — Care Management (Signed)
Barrier- IV lasix.  NSVT- started on Amiodarone

## 2017-03-17 NOTE — Plan of Care (Signed)
Problem: Physical Regulation: Goal: Ability to maintain clinical measurements within normal limits will improve Outcome: Not Progressing Potassium = only 3.3 today. Replaced. Will continue to monitor. Jari Favre [facility]

## 2017-03-17 NOTE — Progress Notes (Signed)
Overnight oximetry initiated. Pt on [place] @ 3 lpm

## 2017-03-17 NOTE — Progress Notes (Signed)
Overnight oximetry not done on the night of 03/16/17. Overnight was in use on another pt, will initiate tonight.

## 2017-03-18 ENCOUNTER — Telehealth: Payer: Self-pay | Admitting: Cardiovascular Disease

## 2017-03-18 ENCOUNTER — Inpatient Hospital Stay: Payer: Medicare Other

## 2017-03-18 DIAGNOSIS — Z8679 Personal history of other diseases of the circulatory system: Secondary | ICD-10-CM

## 2017-03-18 DIAGNOSIS — E872 Acidosis, unspecified: Secondary | ICD-10-CM

## 2017-03-18 DIAGNOSIS — E876 Hypokalemia: Secondary | ICD-10-CM

## 2017-03-18 DIAGNOSIS — I248 Other forms of acute ischemic heart disease: Secondary | ICD-10-CM

## 2017-03-18 DIAGNOSIS — J209 Acute bronchitis, unspecified: Secondary | ICD-10-CM

## 2017-03-18 DIAGNOSIS — R739 Hyperglycemia, unspecified: Secondary | ICD-10-CM

## 2017-03-18 DIAGNOSIS — J9601 Acute respiratory failure with hypoxia: Secondary | ICD-10-CM

## 2017-03-18 DIAGNOSIS — I5043 Acute on chronic combined systolic (congestive) and diastolic (congestive) heart failure: Secondary | ICD-10-CM

## 2017-03-18 DIAGNOSIS — J440 Chronic obstructive pulmonary disease with acute lower respiratory infection: Secondary | ICD-10-CM

## 2017-03-18 DIAGNOSIS — Z22322 Carrier or suspected carrier of Methicillin resistant Staphylococcus aureus: Secondary | ICD-10-CM

## 2017-03-18 DIAGNOSIS — I959 Hypotension, unspecified: Secondary | ICD-10-CM

## 2017-03-18 DIAGNOSIS — J9602 Acute respiratory failure with hypercapnia: Secondary | ICD-10-CM

## 2017-03-18 DIAGNOSIS — R8281 Pyuria: Secondary | ICD-10-CM

## 2017-03-18 LAB — BASIC METABOLIC PANEL
ANION GAP: 8 (ref 5–15)
BUN: 33 mg/dL — AB (ref 6–20)
CALCIUM: 8 mg/dL — AB (ref 8.9–10.3)
CO2: 30 mmol/L (ref 22–32)
Chloride: 98 mmol/L — ABNORMAL LOW (ref 101–111)
Creatinine, Ser: 0.73 mg/dL (ref 0.44–1.00)
GFR calc Af Amer: >60 mL/min (ref >60)
Glucose, Bld: 128 mg/dL — ABNORMAL HIGH (ref 65–99)
POTASSIUM: 4.1 mmol/L (ref 3.5–5.1)
SODIUM: 136 mmol/L (ref 135–145)

## 2017-03-18 LAB — CULTURE, RESPIRATORY W GRAM STAIN: Culture: NORMAL

## 2017-03-18 LAB — CULTURE, BLOOD (ROUTINE X 2)
Culture: NO GROWTH
Culture: NO GROWTH
SPECIAL REQUESTS: ADEQUATE
SPECIAL REQUESTS: ADEQUATE

## 2017-03-18 LAB — CULTURE, RESPIRATORY

## 2017-03-18 LAB — POTASSIUM: Potassium: 4.7 mmol/L (ref 3.5–5.1)

## 2017-03-18 LAB — MAGNESIUM: MAGNESIUM: 2.5 mg/dL — AB (ref 1.7–2.4)

## 2017-03-18 LAB — HEMOGLOBIN: HEMOGLOBIN: 12 g/dL (ref 12.0–16.0)

## 2017-03-18 MED ORDER — AMIODARONE HCL 400 MG PO TABS: 400.0000 mg | ORAL_TABLET | Freq: Two times a day (BID) | ORAL | 3 refills | Status: DC

## 2017-03-18 MED ORDER — TROLAMINE SALICYLATE 10 % EX CREA: TOPICAL_CREAM | CUTANEOUS | 0 refills | Status: DC | PRN

## 2017-03-18 MED ORDER — BENZONATATE 100 MG PO CAPS: 100.0000 mg | ORAL_CAPSULE | Freq: Four times a day (QID) | ORAL | 0 refills | Status: DC | PRN

## 2017-03-18 MED ORDER — LOSARTAN POTASSIUM 25 MG PO TABS: 25.0000 mg | ORAL_TABLET | Freq: Two times a day (BID) | ORAL | 3 refills | Status: DC

## 2017-03-18 MED ORDER — POTASSIUM CHLORIDE CRYS ER 20 MEQ PO TBCR: 20.0000 meq | EXTENDED_RELEASE_TABLET | Freq: Every day | ORAL | 3 refills | Status: DC

## 2017-03-18 MED ORDER — PREDNISONE 10 MG (21) PO TBPK: ORAL_TABLET | ORAL | 0 refills | Status: DC

## 2017-03-18 NOTE — Telephone Encounter (Signed)
Per Dr. Mariah Milling patient is seeing [facility] 8/28 and can then follow up with [place] on 04/07/17

## 2017-03-18 NOTE — Telephone Encounter (Signed)
[facility] ph [facility] for Acute on chronic respiratory failure with hypoxia  needs 1 week with g[facility]an  added to waitlist

## 2017-03-18 NOTE — Progress Notes (Signed)
PT Cancellation Note  Patient Details Name: [name] [name] MRN: [id number] DOB: 1946-02-18   Cancelled Treatment:    Reason Eval/Treat Not Completed: Patient declined, no reason specified (Pt stating "just got into bed" and "lunch is coming soon"  agreeable for PTA to check back in pm, will continue efforts)   Jeanne Diefendorf 03/18/2017, 11:34 AM

## 2017-03-18 NOTE — Discharge Summary (Signed)
[facility] [place] at [facility]   PATIENT NAME: [name] [name]    MR#:  [id number]  DATE OF BIRTH:  1946/04/01  DATE OF ADMISSION:  03/13/2017 ADMITTING PHYSICIAN: Gracelyn Nurse, MD  DATE OF DISCHARGE: No discharge date for patient encounter.  PRIMARY CARE PHYSICIAN: Patient, No Pcp Per     ADMISSION DIAGNOSIS:  Acute on chronic respiratory failure with hypoxia (HCC) [J96.21] Sepsis due to undetermined organism (HCC) [A41.9]  DISCHARGE DIAGNOSIS:  Active Problems:   Sepsis (HCC)   COPD with acute bronchitis (HCC)   Acute on chronic combined systolic and diastolic CHF (congestive heart failure) (HCC)   Hypotension   MRSA carrier   Hyperglycemia   Demand ischemia (HCC)   Hypokalemia   Lactic acidosis   Pyuria   V tach (HCC)   SECONDARY DIAGNOSIS:   Past Medical History:  Diagnosis Date  . CHF (congestive heart failure) (HCC)   . COPD (chronic obstructive pulmonary disease) (HCC)   . Hypertension   . Lung nodule     .pro HOSPITAL COURSE:   #1. Sepsis due to acute bronchitis,  Completed Zithromax  Therapy. Pro-Calcitonin was negative,  sputum culture Is still pending, and reintubated for better growth, however patient remains afebrile , white blood cell count is normal #2. MRSA carrier, completed mupirocin 10 doses intranasally  #3. Hypotension, resolved #4acute on chronic combined systolic and diastolic CHF, continue Lasix orally, net negative approximately 6.6  L since admission , potassium supplements,  continue advanced doses of Cozaar and  Coreg,  appreciate cardiology's input, no further workup is recommended in the hospital, echocardiogram today revealed ejection fraction of 25-30%, diastolic dysfunction, severe mitral regurgitation. Patient is on 3 L of oxygen through nasal cannula, O2 sats were 100%, she is on 2-3 L at home  #5 Hyperglycemia, hemoglobin A1c was 5.1, no diabetes  #6. Demand ischemia, continue Coreg, Cozaar,  aspirin,  appreciate  cardiologist input, felt that patient's symptoms are related to fluid overload in the setting of COPD exacerbation/hypotension to nitroglycerin, recommended to continue Lasix, COPD therapy, cardiologist is to follow up patient as outpatient and review ischemic workup at [facility], initially  possibly  stress test once pulmonary status improves as outpatient. Continue advanced losartan, coreg , may need to be advanced even higher. #7. Hypokalemia, resolved, follow closely as outpatient since patient is on higher doses of potassium and Cozaar #8. Lactic acidosis, resolved  #9 anemia, stable #10. Pyuria, no obvious  urinary tract infection, urine cultures revealed Escherichia coli and enterococcus fecium, 30,000 colony forming units and 10,000 colony-forming units, respectively, suspected contamination, patient received Zosyn for only 2-1/2-3 days, she is asymptomatic, follow closely clinically as outpatient #11. V. tach, continue amiodarone,  400 mg twice daily for 1 week, then 200 mg twice daily for 1 week, then 200 mg daily, follow up with cardiologist as outpatient  DISCHARGE CONDITIONS:   Stable  CONSULTS OBTAINED:  Treatment Team:  Antonieta Iba, MD  DRUG ALLERGIES:   Allergies  Allergen Reactions  . Sulfa Antibiotics Itching    DISCHARGE MEDICATIONS:   Current Discharge Medication List    START taking these medications   Details  amiodarone (PACERONE) 400 MG tablet Take 1 tablet (400 mg total) by mouth 2 (two) times daily. Please take 2 pills twice daily for 7 days, then 1 pill twice daily for 7 days, then 1 pill daily continuous, thank you Qty: 60 tablet, Refills: 3    benzonatate (TESSALON PERLES)  100 MG capsule Take 1 capsule (100 mg total) by mouth every 6 (six) hours as needed for cough. Qty: 30 capsule, Refills: 0    predniSONE (STERAPRED UNI-PAK 21 TAB) 10 MG (21) TBPK tablet Please take 6 pills in the morning on the day 1, then taper by one pill daily  until finished, thank you Qty: 21 tablet, Refills: 0    trolamine salicylate (ASPERCREME) 10 % cream Apply topically as needed for muscle pain. Qty: 85 g, Refills: 0      CONTINUE these medications which have CHANGED   Details  losartan (COZAAR) 25 MG tablet Take 1 tablet (25 mg total) by mouth 2 (two) times daily. Qty: 60 tablet, Refills: 3    potassium chloride SA (K-DUR,KLOR-CON) 20 MEQ tablet Take 1 tablet (20 mEq total) by mouth daily. Qty: 30 tablet, Refills: 3      CONTINUE these medications which have NOT CHANGED   Details  aspirin 81 MG chewable tablet Chew 81 mg by mouth daily.    atorvastatin (LIPITOR) 40 MG tablet Take 40 mg by mouth daily at 6 PM.    budesonide-formoterol (SYMBICORT) 160-4.5 MCG/ACT inhaler Inhale 2 puffs into the lungs 2 (two) times daily.    carvedilol (COREG) 25 MG tablet Take 1 tablet by mouth 2 (two) times daily. Refills: 0    citalopram (CELEXA) 20 MG tablet Take 20 mg by mouth daily.    DULoxetine (CYMBALTA) 30 MG capsule Take 30 mg by mouth daily.    ferrous sulfate 325 (65 FE) MG tablet Take 1 tablet by mouth daily. Refills: 11    fluticasone (FLONASE) 50 MCG/ACT nasal spray Place 2 sprays into both nostrils daily.    furosemide (LASIX) 80 MG tablet Take 1 tablet by mouth 2 (two) times daily. Refills: 1    omeprazole (PRILOSEC) 20 MG capsule Take 20 mg by mouth daily.    albuterol (PROVENTIL HFA;VENTOLIN HFA) 108 (90 Base) MCG/ACT inhaler Inhale 1 puff into the lungs every 6 (six) hours as needed for wheezing or shortness of breath.    alum & mag hydroxide-simeth (MAALOX/MYLANTA) 200-200-20 MG/5ML suspension Take 5 mLs by mouth every 8 (eight) hours as needed for indigestion or heartburn.    diphenhydrAMINE (BENADRYL) 25 mg capsule Take 25 mg by mouth every 6 (six) hours as needed.      STOP taking these medications     sacubitril-valsartan (ENTRESTO) 24-26 MG      spironolactone (ALDACTONE) 25 MG tablet           DISCHARGE INSTRUCTIONS:    The patient is to follow-up with primary care physician and cardiologist as outpatient  If you experience worsening of your admission symptoms, develop shortness of breath, life threatening emergency, suicidal or homicidal thoughts you must seek medical attention immediately by calling 911 or calling your MD immediately  if symptoms less severe.  You Must read complete instructions/literature along with all the possible adverse reactions/side effects for all the Medicines you take and that have been prescribed to you. Take any new Medicines after you have completely understood and accept all the possible adverse reactions/side effects.   Please note  You were cared for by a hospitalist during your hospital stay. If you have any questions about your discharge medications or the care you received while you were in the hospital after you are discharged, you can call the unit and asked to speak with the hospitalist on call if the hospitalist that took care of you is not available.  Once you are discharged, your primary care physician will handle any further medical issues. Please note that NO REFILLS for any discharge medications will be authorized once you are discharged, as it is imperative that you return to your primary care physician (or establish a relationship with a primary care physician if you do not have one) for your aftercare needs so that they can reassess your need for medications and monitor your lab values.    Today   CHIEF COMPLAINT:   Chief Complaint  Patient presents with  . Shortness of Breath    HISTORY OF PRESENT ILLNESS:     VITAL SIGNS:  Blood pressure 111/60, pulse 74, temperature 97.6 F (36.4 C), temperature source Oral, resp. rate 18, height 5\' 2"  (1.575 m), weight 71.9 kg (158 lb 8.2 oz), SpO2 (!) 88 %.  I/O:    Intake/Output Summary (Last 24 hours) at 03/18/17 1248 Last data filed at 03/18/17 0520  Gross per 24 hour   Intake                0 ml  Output             1550 ml  Net            -1550 ml    PHYSICAL EXAMINATION:  GENERAL:  71 y.o.-year-old patient lying in the bed with no acute distress.  EYES: Pupils equal, round, reactive to light and accommodation. No scleral icterus. Extraocular muscles intact.  HEENT: Head atraumatic, normocephalic. Oropharynx and nasopharynx clear.  NECK:  Supple, no jugular venous distention. No thyroid enlargement, no tenderness.  LUNGS: Normal breath sounds bilaterally, no wheezing, rales,rhonchi or crepitation. No use of accessory muscles of respiration.  CARDIOVASCULAR: S1, S2 normal. No murmurs, rubs, or gallops.  ABDOMEN: Soft, non-tender, non-distended. Bowel sounds present. No organomegaly or mass.  EXTREMITIES: No pedal edema, cyanosis, or clubbing.  NEUROLOGIC: Cranial nerves II through XII are intact. Muscle strength 5/5 in all extremities. Sensation intact. Gait not checked.  PSYCHIATRIC: The patient is alert and oriented x 3.  SKIN: No obvious rash, lesion, or ulcer.   DATA REVIEW:   CBC  Recent Labs Lab 03/14/17 0426 03/18/17 0419  WBC 5.0  --   HGB 10.6* 12.0  HCT 32.1*  --   PLT 166  --     Chemistries   Recent Labs Lab 03/14/17 0426  03/18/17 0419 03/18/17 0951  NA 143  < >  --  136  K 3.3*  < > 4.7 4.1  CL 105  < >  --  98*  CO2 27  < >  --  30  GLUCOSE 146*  < >  --  128*  BUN 16  < >  --  33*  CREATININE 0.78  < >  --  0.73  CALCIUM 8.0*  < >  --  8.0*  MG  --   < > 2.5*  --   AST 18  --   --   --   ALT 14  --   --   --   ALKPHOS 44  --   --   --   BILITOT 0.5  --   --   --   < > = values in this interval not displayed.  Cardiac Enzymes  Recent Labs Lab 03/14/17 0426  TROPONINI 0.03*    Microbiology Results  Results for orders placed or performed during the hospital encounter of 03/13/17  Culture, blood (routine x 2)  Status: None   Collection Time: 03/13/17  8:41 AM  Result Value Ref Range Status    Specimen Description BLOOD BLOOD LEFT ARM  Final   Special Requests   Final    BOTTLES DRAWN AEROBIC AND ANAEROBIC Blood Culture adequate volume   Culture NO GROWTH 5 DAYS  Final   Report Status 03/18/2017 FINAL  Final  Culture, blood (routine x 2)     Status: None   Collection Time: 03/13/17  8:55 AM  Result Value Ref Range Status   Specimen Description BLOOD BLOOD LEFT FOREARM  Final   Special Requests   Final    BOTTLES DRAWN AEROBIC AND ANAEROBIC Blood Culture adequate volume   Culture NO GROWTH 5 DAYS  Final   Report Status 03/18/2017 FINAL  Final  Urine culture     Status: Abnormal   Collection Time: 03/13/17  9:55 AM  Result Value Ref Range Status   Specimen Description URINE, RANDOM  Final   Special Requests NONE  Final   Culture (A)  Final    30,000 COLONIES/mL ESCHERICHIA COLI Confirmed Extended Spectrum Beta-Lactamase Producer (ESBL) 10,000 COLONIES/mL ENTEROCOCCUS FAECIUM    Report Status 03/16/2017 FINAL  Final   Organism ID, Bacteria ESCHERICHIA COLI (A)  Final   Organism ID, Bacteria ENTEROCOCCUS FAECIUM (A)  Final      Susceptibility   Escherichia coli - MIC*    AMPICILLIN >=32 RESISTANT Resistant     CEFAZOLIN >=64 RESISTANT Resistant     CEFTRIAXONE RESISTANT Resistant     CIPROFLOXACIN >=4 RESISTANT Resistant     GENTAMICIN <=1 SENSITIVE Sensitive     IMIPENEM <=0.25 SENSITIVE Sensitive     NITROFURANTOIN <=16 SENSITIVE Sensitive     TRIMETH/SULFA >=320 RESISTANT Resistant     AMPICILLIN/SULBACTAM 4 SENSITIVE Sensitive     PIP/TAZO <=4 SENSITIVE Sensitive     Extended ESBL POSITIVE Resistant     * 30,000 COLONIES/mL ESCHERICHIA COLI   Enterococcus faecium - MIC*    AMPICILLIN <=2 SENSITIVE Sensitive     LEVOFLOXACIN 2 SENSITIVE Sensitive     NITROFURANTOIN 64 INTERMEDIATE Intermediate     VANCOMYCIN <=0.5 SENSITIVE Sensitive     * 10,000 COLONIES/mL ENTEROCOCCUS FAECIUM  MRSA PCR Screening     Status: Abnormal   Collection Time: 03/13/17 12:06 PM   Result Value Ref Range Status   MRSA by PCR POSITIVE (A) NEGATIVE Final    Comment:        The GeneXpert MRSA Assay (FDA approved for NASAL specimens only), is one component of a comprehensive MRSA colonization surveillance program. It is not intended to diagnose MRSA infection nor to guide or monitor treatment for MRSA infections. RESULT CALLED TO, READ BACK BY AND VERIFIED WITH: BRITTANY RUDD ON 03/13/17 AT 1351 QSD   Culture, expectorated sputum-assessment     Status: None   Collection Time: 03/14/17  7:47 AM  Result Value Ref Range Status   Specimen Description SPUTUM  Final   Special Requests NONE  Final   Sputum evaluation THIS SPECIMEN IS ACCEPTABLE FOR SPUTUM CULTURE  Final   Report Status 03/16/2017 FINAL  Final  Culture, respiratory (NON-Expectorated)     Status: None (Preliminary result)   Collection Time: 03/14/17  7:47 AM  Result Value Ref Range Status   Specimen Description SPUTUM  Final   Special Requests NONE Reflexed from [id number]  Final   Gram Stain   Final    RARE WBC PRESENT, PREDOMINANTLY PMN RARE SQUAMOUS EPITHELIAL CELLS PRESENT NO ORGANISMS  SEEN    Culture   Final    CULTURE REINCUBATED FOR BETTER GROWTH Performed at [facility], [address] [place], [place] [address]    Report Status PENDING  Incomplete    RADIOLOGY:  Dg Chest 2 View  Result Date: 03/18/2017 CLINICAL DATA:  Cough for 2-3 days EXAM: CHEST  2 VIEW COMPARISON:  Eighteen 2018 FINDINGS: Mild cardiomegaly and vascular congestion. Small bilateral effusions. No overt edema or confluent airspace opacities. No acute bony abnormality. IMPRESSION: Cardiomegaly, vascular congestion. Small bilateral effusions. Electronically Signed   By: Charlett Nose M.D.   On: 03/18/2017 11:21    EKG:   Orders placed or performed during the hospital encounter of 03/13/17  . ED EKG  . ED EKG  . ED EKG  . ED EKG  . EKG 12-Lead  . EKG 12-Lead  . EKG 12-Lead  . EKG 12-Lead      Management  plans discussed with the patient, family and they are in agreement.  CODE STATUS:     Code Status Orders        Start     Ordered   03/13/17 1206  Full code  Continuous     03/13/17 1205    Code Status History    Date Active Date Inactive Code Status Order ID Comments User Context   06/03/2016 10:45 AM 06/05/2016  4:30 PM Full Code [id number]  Shaune Pollack, MD Inpatient      TOTAL TIME TAKING CARE OF THIS PATIENT: 40 minutes.    Katharina Caper M.D on 03/18/2017 at 12:48 PM  Between 7am to 6pm - Pager - [phone]  After 6pm go to [email or web address] - password [email or web address]  [facility]  Office  [phone]  CC: Primary care physician; Patient, No Pcp Per

## 2017-03-18 NOTE — Telephone Encounter (Signed)
Patient has appt on 03/23/17 with Clarisa Kindred, NP in heart failure clinic. Appt with our office is 04/07/17 with Ward Givens, NP. Dr Mariah Milling saw patient while in the hospital. Are these appointments appropriate for patient? If patient needs to see [facility] sooner, please advise on where to add patient to your schedule.  Thank you very much! Routing to Dr Mariah Milling for review.

## 2017-03-18 NOTE — Care Management Important Message (Signed)
Important Message  Patient Details  Name: [name] [name] MRN: [id number] Date of Birth: 1946-03-06   Medicare Important Message Given:  Yes Signed IM notice given    Eber Hong, RN 03/18/2017, 2:39 PM

## 2017-03-18 NOTE — Care Management (Signed)
Notified [facility] of discharge and faxed home health orders and DC summary

## 2017-03-18 NOTE — Progress Notes (Signed)
Patient Name: [name] [name] Date of Encounter: 03/18/2017  Primary Cardiologist: [facility] to [facility] - consult by [facility] Problem List     Active Problems:   Sepsis [facility])     Subjective   Notes a persistent cough. Documented UOP of 2.4 L for the past 24 hours and net negative 6.6 L for the admission. Renal function stable at 0.73, potassium 4.1. No records from [facility] available.   Inpatient Medications    Scheduled Meds: . albuterol  5 mg Nebulization Once  . amiodarone  400 mg Oral BID  . aspirin  81 mg Oral Daily  . carvedilol  25 mg Oral BID WC  . Chlorhexidine Gluconate Cloth  6 each Topical Q0600  . chlorpheniramine-HYDROcodone  5 mL Oral Q12H  . citalopram  20 mg Oral Daily  . DULoxetine  30 mg Oral Daily  . enoxaparin (LOVENOX) injection  40 mg Subcutaneous Q24H  . ipratropium-albuterol  3 mL Nebulization TID  . losartan  25 mg Oral BID  . mouth rinse  15 mL Mouth Rinse BID  . nitroGLYCERIN  0.5 inch Topical Q6H  . pantoprazole  40 mg Oral Daily  . potassium chloride  20 mEq Oral Daily  . predniSONE  50 mg Oral Q breakfast   Continuous Infusions:  PRN Meds: acetaminophen **OR** acetaminophen, ipratropium-albuterol, ondansetron **OR** ondansetron (ZOFRAN) IV, traMADol-acetaminophen, trolamine salicylate   Vital Signs    Vitals:   03/17/17 1945 03/17/17 2034 03/18/17 0518 03/18/17 0751  BP: 125/66  134/68   Pulse: 69  66   Resp: 18  18   Temp: 97.9 F (36.6 C)  97.6 F (36.4 C)   TempSrc: Oral  Oral   SpO2: 100% 100% 100% 100%  Weight:      Height:        Intake/Output Summary (Last 24 hours) at 03/18/17 0903 Last data filed at 03/18/17 0520  Gross per 24 hour  Intake                0 ml  Output             2450 ml  Net            -2450 ml   Filed Weights   03/13/17 1207  Weight: 158 lb 8.2 oz (71.9 kg)    Physical Exam    GEN: Elderly, frail appearing, in no acute distress.  HEENT: Grossly normal.  Neck: Supple, no JVD, carotid  bruits, or masses. Cardiac: RRR, no murmurs, rubs, or gallops. No clubbing, cyanosis, edema.  Radials/DP/PT 2+ and equal bilaterally.  Respiratory:  Diffuse coarse breath sounds bilaterally. GI: Soft, nontender, nondistended, BS + x 4. MS: no deformity or atrophy. Skin: warm and dry, no rash. Neuro:  Strength and sensation are intact. Psych: AAOx3.  Normal affect.  Labs    CBC  Recent Labs  03/18/17 0419  HGB 12.0   Basic Metabolic Panel  Recent Labs  03/17/17 0541 03/18/17 0419  NA 138  --   K 3.7 4.7  CL 99*  --   CO2 30  --   GLUCOSE 106*  --   BUN 32*  --   CREATININE 0.84  --   CALCIUM 7.8*  --   MG  --  2.5*   Liver Function Tests No results for input(s): AST, ALT, ALKPHOS, BILITOT, PROT, ALBUMIN in the last 72 hours. No results for input(s): LIPASE, AMYLASE in the last 72 hours. Cardiac Enzymes No  results for input(s): CKTOTAL, CKMB, CKMBINDEX, TROPONINI in the last 72 hours. BNP Invalid input(s): POCBNP D-Dimer No results for input(s): DDIMER in the last 72 hours. Hemoglobin A1C No results for input(s): HGBA1C in the last 72 hours. Fasting Lipid Panel No results for input(s): CHOL, HDL, LDLCALC, TRIG, CHOLHDL, LDLDIRECT in the last 72 hours. Thyroid Function Tests No results for input(s): TSH, T4TOTAL, T3FREE, THYROIDAB in the last 72 hours.  Invalid input(s): FREET3  Telemetry    NSR - Personally Reviewed  ECG    n/a - Personally Reviewed  Radiology    No results found.  Cardiac Studies   TTE 03/15/2017: Study Conclusions  - Left ventricle: The cavity size was moderately dilated. There was   mild concentric hypertrophy. Systolic function was severely   reduced. The estimated ejection fraction was in the range of 25%   to 30%. Features are consistent with a pseudonormal left   ventricular filling pattern, with concomitant abnormal relaxation   and increased filling pressure (grade 2 diastolic dysfunction). - Mitral valve: There was  severe regurgitation. - Left atrium: The atrium was moderately dilated. - Right ventricle: The cavity size was mildly dilated. Wall   thickness was normal. - Right atrium: The atrium was mildly dilated.  Patient Profile     71 y.o. female with history of chronic respiratory failure 2/2 COPD on home oxygen with end-stage COPD, chronic systolic CHF, HTN, and pulmonary nodule who was previously on Hospice care presented to [facility] with acute on chronic respiratory failure with hypoxia.    Assessment & Plan    1. Acute on chornic respiratory failure with hypoxia: -Likely multifactorial including acute on chronic systolic CHF, exacerbation of end-stage COPD -Supplemental oxygen per IM  2. Acute on chronic systolic CHF: -She has diuresed well, documented 6.6 L for the admission and 2.4 L for the past 24 hours -Continue IV Lasix with KCl repletion -Tolerating low-dose Coreg, will not titrate given end stage COPD -Continue losartan -Add spironolactone given CM -Strict Is and Os, daily weights   3. End stage COPD with exacerbation: -Per IM -Consider antibiotics  4. CAD/demand ischemia: -Patient and family report patient having had a cardiac cath in 05/2016 at [facility]. There is no record of the patient being in the [facility] system -No records from [facility] available for review -[name] has been known since at least the year prior -She was previously on Hospice -Could consider Lexiscan Myoview to evaluate for high-risk ischemia if we are unable to obtain outside hospital records with possible cardiac cath -Uncertain if she would even be a cardiac cath candidate given her severe illness, if not found to be a cath candidate, there is unlikely to be any reason to perform nuclear stress testing  5. Sepsis with AECOPD: -As above  6. NSVT/possible Afib: -Asymptomatic  -Continue amiodarone 400 mg bid x 1 week followed by 200 mg bid x 1 week, then 20 mg daily thereafter -Coreg as above -Potassium  repleted -Magnesium repleted -Hold on full-dose anticoagulation at this time given frail state and acute illness  -Consider stopping albuterol and changing to Xopenex, defer to internal medicine  Signed, Eula Listen, PA-C [facility] Pager: [phone] 03/18/2017, 9:03 AM   Attending Note Patient seen and examined, agree with detailed note above,  Patient presentation and plan discussed on rounds.  EKG lab work, chest x-ray, echocardiogram reviewed independently by myself   significant cough overnight, kept her awake Cough this morning, thick, sometimes productive Denies abdominal bloating or  leg swelling  Long history of cardiomyopathy presumed to be ischemic patient reports having admission at [facility] in 2017, records have been requested   previously on hospice, came off hospice as she was stable She has changed her status, prefers to be full code Talk to son on the phone yesterday, he is aware  On physical exam coarse breath sounds bilaterally, unable to assess JVP, heart sounds regular with ectopy no murmurs appreciated, abdomen soft nontender, no significant lower extremity edema   Lab work reviewed showing creatinine 0.84, BUN climbing  32  A/P 1. Acute on chornic respiratory failure with hypoxia: End-stage COPD, acute on chronic systolic CHF,  She may benefit from prednisone taper, consider antibiotics for bronchitis Currently on oxygen at home uses nebulizers at home  She may benefit from outpatient pulmonary follow-up   2. Acute on chronic systolic CHF: 6 L negative Given stable renal function, could continue IV Lasix at this point poor pulmonary status likely secondary to underlying COPD exacerbation low-dose Coreg,  losartan, spironolactone   3. CAD/demand ischemia: Previous records from [facility] have been requested Currently no plan for ischemia workup   previously on Hospice  5. NSVT/possible Afib/narrow complex tachycardia  amiodarone 400 mg bid x 1  week followed by 200 mg bid x 1 week, then 20 mg daily thereafter -Coreg as above  Greater than 50% was spent in counseling and coordination of care with patient Total encounter time 25 minutes or more  Signed: Dossie Arbour  M.D., Ph.D. [facility]

## 2017-03-19 HISTORY — PX: TYMPANOSTOMY TUBE PLACEMENT: SHX32

## 2017-03-20 DIAGNOSIS — R911 Solitary pulmonary nodule: Secondary | ICD-10-CM | POA: Diagnosis not present

## 2017-03-20 DIAGNOSIS — Z7951 Long term (current) use of inhaled steroids: Secondary | ICD-10-CM | POA: Diagnosis not present

## 2017-03-20 DIAGNOSIS — Z7982 Long term (current) use of aspirin: Secondary | ICD-10-CM | POA: Diagnosis not present

## 2017-03-20 DIAGNOSIS — Z9981 Dependence on supplemental oxygen: Secondary | ICD-10-CM | POA: Diagnosis not present

## 2017-03-20 DIAGNOSIS — J440 Chronic obstructive pulmonary disease with acute lower respiratory infection: Secondary | ICD-10-CM | POA: Diagnosis not present

## 2017-03-20 DIAGNOSIS — I420 Dilated cardiomyopathy: Secondary | ICD-10-CM | POA: Diagnosis not present

## 2017-03-20 DIAGNOSIS — F329 Major depressive disorder, single episode, unspecified: Secondary | ICD-10-CM | POA: Diagnosis not present

## 2017-03-20 DIAGNOSIS — G8929 Other chronic pain: Secondary | ICD-10-CM | POA: Diagnosis not present

## 2017-03-20 DIAGNOSIS — Z22322 Carrier or suspected carrier of Methicillin resistant Staphylococcus aureus: Secondary | ICD-10-CM | POA: Diagnosis not present

## 2017-03-20 DIAGNOSIS — Z87891 Personal history of nicotine dependence: Secondary | ICD-10-CM | POA: Diagnosis not present

## 2017-03-20 DIAGNOSIS — I5043 Acute on chronic combined systolic (congestive) and diastolic (congestive) heart failure: Secondary | ICD-10-CM | POA: Diagnosis not present

## 2017-03-20 DIAGNOSIS — I482 Chronic atrial fibrillation: Secondary | ICD-10-CM | POA: Diagnosis not present

## 2017-03-20 DIAGNOSIS — J209 Acute bronchitis, unspecified: Secondary | ICD-10-CM | POA: Diagnosis not present

## 2017-03-20 DIAGNOSIS — D649 Anemia, unspecified: Secondary | ICD-10-CM | POA: Diagnosis not present

## 2017-03-20 DIAGNOSIS — I110 Hypertensive heart disease with heart failure: Secondary | ICD-10-CM | POA: Diagnosis not present

## 2017-03-20 DIAGNOSIS — J9611 Chronic respiratory failure with hypoxia: Secondary | ICD-10-CM | POA: Diagnosis not present

## 2017-03-20 DIAGNOSIS — F419 Anxiety disorder, unspecified: Secondary | ICD-10-CM | POA: Diagnosis not present

## 2017-03-20 DIAGNOSIS — I2510 Atherosclerotic heart disease of native coronary artery without angina pectoris: Secondary | ICD-10-CM | POA: Diagnosis not present

## 2017-03-20 DIAGNOSIS — J441 Chronic obstructive pulmonary disease with (acute) exacerbation: Secondary | ICD-10-CM | POA: Diagnosis not present

## 2017-03-22 DIAGNOSIS — J440 Chronic obstructive pulmonary disease with acute lower respiratory infection: Secondary | ICD-10-CM | POA: Diagnosis not present

## 2017-03-22 DIAGNOSIS — J209 Acute bronchitis, unspecified: Secondary | ICD-10-CM | POA: Diagnosis not present

## 2017-03-22 DIAGNOSIS — J441 Chronic obstructive pulmonary disease with (acute) exacerbation: Secondary | ICD-10-CM | POA: Diagnosis not present

## 2017-03-22 DIAGNOSIS — I420 Dilated cardiomyopathy: Secondary | ICD-10-CM | POA: Diagnosis not present

## 2017-03-22 DIAGNOSIS — I2510 Atherosclerotic heart disease of native coronary artery without angina pectoris: Secondary | ICD-10-CM | POA: Diagnosis not present

## 2017-03-22 DIAGNOSIS — J9611 Chronic respiratory failure with hypoxia: Secondary | ICD-10-CM | POA: Diagnosis not present

## 2017-03-23 ENCOUNTER — Ambulatory Visit: Payer: Medicare Other | Admitting: Family

## 2017-03-24 ENCOUNTER — Ambulatory Visit: Payer: Medicare Other | Attending: Family | Admitting: Family

## 2017-03-24 ENCOUNTER — Encounter: Payer: Self-pay | Admitting: Family

## 2017-03-24 DIAGNOSIS — Z882 Allergy status to sulfonamides status: Secondary | ICD-10-CM | POA: Diagnosis not present

## 2017-03-24 DIAGNOSIS — I420 Dilated cardiomyopathy: Secondary | ICD-10-CM | POA: Diagnosis not present

## 2017-03-24 DIAGNOSIS — Z87891 Personal history of nicotine dependence: Secondary | ICD-10-CM | POA: Diagnosis not present

## 2017-03-24 DIAGNOSIS — J440 Chronic obstructive pulmonary disease with acute lower respiratory infection: Secondary | ICD-10-CM | POA: Diagnosis not present

## 2017-03-24 DIAGNOSIS — J449 Chronic obstructive pulmonary disease, unspecified: Secondary | ICD-10-CM | POA: Diagnosis not present

## 2017-03-24 DIAGNOSIS — J410 Simple chronic bronchitis: Secondary | ICD-10-CM

## 2017-03-24 DIAGNOSIS — Z9981 Dependence on supplemental oxygen: Secondary | ICD-10-CM | POA: Insufficient documentation

## 2017-03-24 DIAGNOSIS — Z7982 Long term (current) use of aspirin: Secondary | ICD-10-CM | POA: Diagnosis not present

## 2017-03-24 DIAGNOSIS — I110 Hypertensive heart disease with heart failure: Secondary | ICD-10-CM | POA: Insufficient documentation

## 2017-03-24 DIAGNOSIS — J441 Chronic obstructive pulmonary disease with (acute) exacerbation: Secondary | ICD-10-CM | POA: Diagnosis not present

## 2017-03-24 DIAGNOSIS — I5022 Chronic systolic (congestive) heart failure: Secondary | ICD-10-CM | POA: Insufficient documentation

## 2017-03-24 DIAGNOSIS — I10 Essential (primary) hypertension: Secondary | ICD-10-CM

## 2017-03-24 DIAGNOSIS — I2510 Atherosclerotic heart disease of native coronary artery without angina pectoris: Secondary | ICD-10-CM | POA: Diagnosis not present

## 2017-03-24 DIAGNOSIS — G8929 Other chronic pain: Secondary | ICD-10-CM | POA: Insufficient documentation

## 2017-03-24 DIAGNOSIS — Z96659 Presence of unspecified artificial knee joint: Secondary | ICD-10-CM | POA: Diagnosis not present

## 2017-03-24 DIAGNOSIS — G894 Chronic pain syndrome: Secondary | ICD-10-CM

## 2017-03-24 DIAGNOSIS — J9611 Chronic respiratory failure with hypoxia: Secondary | ICD-10-CM | POA: Diagnosis not present

## 2017-03-24 DIAGNOSIS — J209 Acute bronchitis, unspecified: Secondary | ICD-10-CM | POA: Diagnosis not present

## 2017-03-24 MED ORDER — BENZONATATE 100 MG PO CAPS: 100.0000 mg | ORAL_CAPSULE | Freq: Four times a day (QID) | ORAL | 0 refills | Status: DC | PRN

## 2017-03-24 MED ORDER — POTASSIUM CHLORIDE CRYS ER 20 MEQ PO TBCR: 20.0000 meq | EXTENDED_RELEASE_TABLET | Freq: Every day | ORAL | 5 refills | Status: DC

## 2017-03-24 MED ORDER — PREDNISONE 10 MG (21) PO TBPK
ORAL_TABLET | ORAL | 0 refills | Status: DC
Start: 2017-03-24 — End: 2017-03-31

## 2017-03-24 MED ORDER — TROLAMINE SALICYLATE 10 % EX CREA: TOPICAL_CREAM | CUTANEOUS | 0 refills | Status: DC | PRN

## 2017-03-24 NOTE — Progress Notes (Signed)
Patient ID: [name] [name], female    DOB: [birth year], 71 y.o.   MRN: [id number]  HPI  [name] [name] is a 71 y/o female with a history of HTN, COPD, chronic pain, previous tobacco use and chronic heart failure.   Echo report from 03/15/17 reviewed and showed an EF of 25-30% along with severe MR. This is unchanged from [facility] November 2017.  Admitted 03/13/17 due to sepsis and HF. Treated with antibiotics. Cardiology consult obtained. Initially needed IV diuretics and transitioned to oral diuretics with net loss of ~6.6 liters of fluid. Oxygen continued. Discharged home after 5 days.  [name] [name] presents today for her initial visit with a chief complaint of moderate shortness of breath with minimal exertion. [name] [name] describes this as chronic in nature having been present for several years with varying levels of severity. [name] [name] has associated fatigue, cough, wheezing, chest pain, headache, dizziness and weakness. Denies any swelling in her legs.  Past Medical History:  Diagnosis Date  . CHF (congestive heart failure) (HCC)   . COPD (chronic obstructive pulmonary disease) (HCC)   . Hypertension   . Lung nodule    Past Surgical History:  Procedure Laterality Date  . REPLACEMENT TOTAL KNEE     No family history on file. Social History  Substance Use Topics  . Smoking status: Former Games developer  . Smokeless tobacco: Never Used  . Alcohol use No   Allergies  Allergen Reactions  . Sulfa Antibiotics Itching   Prior to Admission medications   Medication Sig Start Date End Date Taking? Authorizing Provider  amiodarone (PACERONE) 400 MG tablet Take 1 tablet (400 mg total) by mouth 2 (two) times daily. Please take 2 pills twice daily for 7 days, then 1 pill twice daily for 7 days, then 1 pill daily continuous, thank you 03/18/17  Yes Katharina Caper, MD  aspirin 81 MG chewable tablet Chew 81 mg by mouth daily.   Yes [provider]  atorvastatin (LIPITOR) 40 MG tablet Take 40 mg by mouth daily at 6 PM.   Yes  [provider]  budesonide-formoterol (SYMBICORT) 160-4.5 MCG/ACT inhaler Inhale 2 puffs into the lungs 2 (two) times daily.   Yes [provider]  carvedilol (COREG) 25 MG tablet Take 1 tablet by mouth 2 (two) times daily. 01/22/17  Yes [provider]  diphenhydrAMINE (BENADRYL) 25 mg capsule Take 25 mg by mouth every 6 (six) hours as needed.   Yes [provider]  DULoxetine (CYMBALTA) 30 MG capsule Take 30 mg by mouth daily.   Yes [provider]  ferrous sulfate 325 (65 FE) MG tablet Take 1 tablet by mouth daily. 02/09/17  Yes [provider]  fluticasone (FLONASE) 50 MCG/ACT nasal spray Place 2 sprays into both nostrils daily.   Yes [provider]  furosemide (LASIX) 80 MG tablet Take 1 tablet by mouth 2 (two) times daily. 02/09/17  Yes [provider]  losartan (COZAAR) 25 MG tablet Take 1 tablet (25 mg total) by mouth 2 (two) times daily. 03/18/17  Yes Katharina Caper, MD  omeprazole (PRILOSEC) 20 MG capsule Take 20 mg by mouth daily.   Yes [provider]  albuterol (PROVENTIL HFA;VENTOLIN HFA) 108 (90 Base) MCG/ACT inhaler Inhale 1 puff into the lungs every 6 (six) hours as needed for wheezing or shortness of breath.    [provider]  benzonatate (TESSALON PERLES) 100 MG capsule Take 1 capsule (100 mg total) by mouth every 6 (six) hours as needed for cough.  Patient not taking: Reported on 03/24/2017 03/18/17 03/18/18  Katharina Caper, MD  citalopram (CELEXA) 20 MG tablet Take 20 mg by mouth daily.    [provider]  potassium chloride SA (K-DUR,KLOR-CON) 20 MEQ tablet Take 1 tablet (20 mEq total) by mouth daily. Patient not taking: Reported on 03/24/2017 03/19/17   Katharina Caper, MD  predniSONE (STERAPRED UNI-PAK 21 TAB) 10 MG (21) TBPK tablet Please take 6 pills in the morning on the day 1, then taper by one pill daily until finished, thank you Patient not taking: Reported on 03/24/2017 03/18/17    Katharina Caper, MD  trolamine salicylate (ASPERCREME) 10 % cream Apply topically as needed for muscle pain. Patient not taking: Reported on 03/24/2017 03/18/17   Katharina Caper, MD   Review of Systems  Constitutional: Positive for fatigue. Negative for appetite change.  HENT: Positive for ear pain and hearing loss. Negative for congestion, postnasal drip and sore throat.   Eyes: Negative.   Respiratory: Positive for cough, shortness of breath and wheezing. Negative for chest tightness.   Cardiovascular: Positive for chest pain (since hospital admission). Negative for palpitations and leg swelling.  Gastrointestinal: Positive for abdominal pain. Negative for abdominal distention.  Endocrine: Negative.   Genitourinary: Negative.   Musculoskeletal: Positive for back pain. Negative for neck pain.  Skin: Negative.   Allergic/Immunologic: Negative.   Neurological: Positive for weakness, light-headedness and headaches. Negative for dizziness.  Hematological: Negative for adenopathy. Bruises/bleeds easily.  Psychiatric/Behavioral: Positive for sleep disturbance (not sleeping well; unsure of why). Negative for dysphoric mood. The patient is not nervous/anxious.    Vitals:   03/24/17 1300  BP: (!) 123/58  Pulse: 64  Resp: 18  SpO2: 100%  Weight: 141 lb 4 oz (64.1 kg)  Height: 5\' 2"  (1.575 m)   Wt Readings from Last 3 Encounters:  03/24/17 141 lb 4 oz (64.1 kg)  03/13/17 158 lb 8.2 oz (71.9 kg)  06/05/16 149 lb 6.4 oz (67.8 kg)   Lab Results  Component Value Date   CREATININE 0.73 03/18/2017   CREATININE 0.84 03/17/2017   CREATININE 0.78 03/14/2017   Physical Exam  Constitutional: [name] [name] is oriented to person, place, and time. [name] [name] appears well-developed and well-nourished.  HENT:  Head: Normocephalic and atraumatic.  Right Ear: Decreased hearing is noted.  Left Ear: Decreased hearing is noted.  Neck: Normal range of motion. Neck supple. No JVD present.  Cardiovascular: Normal rate and  regular rhythm.   Pulmonary/Chest: Effort normal and breath sounds normal.  Abdominal: Soft. [name] [name] exhibits no distension. There is no tenderness.  Musculoskeletal: [name] [name] exhibits tenderness (to palpation over anterior chest wall). [name] [name] exhibits no edema.  Neurological: [name] [name] is alert and oriented to person, place, and time.  Skin: Skin is warm and dry.  Psychiatric: [name] [name] has a normal mood and affect. Her behavior is normal. Thought content normal.  Nursing note and vitals reviewed.   Assessment & Plan:  1: Chronic heart failure with reduced ejection fraction- - NYHA class III - euvolemic today - not weighing daily as [name] [name] doesn't have any scales. Set of scales provided today and [name] [name] was instructed to call for an overnight weight gain of >2 pounds or a weekly weight gain of >5 pounds - adds salt "sometimes to eggs". Discussed the importance of not adding any salt to her food so that [name] [name] can closely follow a 2000mg  sodium diet. Written dietary information was given to her about this. - had a home health evaluation done 2 days ago  and will be beginning home PT - has not been taking her potassium because [name] [name] says the RX was sent to the wrong pharmacy so a prescription was sent in today for that - could consider changing her losartan to entresto in the future; patient says that [name] [name] was on it in the past but doesn't know why [name] [name] was taken off of it - sees cardiology Brion Aliment) 04/07/17  2: HTN- - BP looks good today - would like a local PCP so an appointment was made with Dr. Hollace [facility]) on 03/31/17 - BMP done 03/18/17 reviewed and shows sodium 136, potassium 4.1 and GFR >60  3: COPD- - wears oxygen at 2-3L around the clock - sent in new prescription for tessalon perles and prednisone as they were sent to the wrong pharmacy at discharge  4: Chronic pain- - currently on duloxetine which [name] [name] says isn't helping "at all" - was wondering about taking her citalopram and [name] [name] was instructed to  ask her current PCP if [name] [name] was supposed to take both of those - pain in upper chest is reproducible upon palpation  Medication list was reviewed.  Return here in 1 month or sooner for any questions/problems before then.

## 2017-03-24 NOTE — Patient Instructions (Signed)
Begin weighing daily and call for an overnight weight gain of > 2 pounds or a weekly weight gain of >5 pounds. 

## 2017-03-25 DIAGNOSIS — H6123 Impacted cerumen, bilateral: Secondary | ICD-10-CM | POA: Diagnosis not present

## 2017-03-25 DIAGNOSIS — H6523 Chronic serous otitis media, bilateral: Secondary | ICD-10-CM | POA: Diagnosis not present

## 2017-03-25 DIAGNOSIS — H906 Mixed conductive and sensorineural hearing loss, bilateral: Secondary | ICD-10-CM | POA: Diagnosis not present

## 2017-03-26 DIAGNOSIS — J440 Chronic obstructive pulmonary disease with acute lower respiratory infection: Secondary | ICD-10-CM | POA: Diagnosis not present

## 2017-03-26 DIAGNOSIS — J9611 Chronic respiratory failure with hypoxia: Secondary | ICD-10-CM | POA: Diagnosis not present

## 2017-03-26 DIAGNOSIS — I420 Dilated cardiomyopathy: Secondary | ICD-10-CM | POA: Diagnosis not present

## 2017-03-26 DIAGNOSIS — I2510 Atherosclerotic heart disease of native coronary artery without angina pectoris: Secondary | ICD-10-CM | POA: Diagnosis not present

## 2017-03-26 DIAGNOSIS — J209 Acute bronchitis, unspecified: Secondary | ICD-10-CM | POA: Diagnosis not present

## 2017-03-26 DIAGNOSIS — J441 Chronic obstructive pulmonary disease with (acute) exacerbation: Secondary | ICD-10-CM | POA: Diagnosis not present

## 2017-03-30 DIAGNOSIS — I420 Dilated cardiomyopathy: Secondary | ICD-10-CM | POA: Diagnosis not present

## 2017-03-30 DIAGNOSIS — I2510 Atherosclerotic heart disease of native coronary artery without angina pectoris: Secondary | ICD-10-CM | POA: Diagnosis not present

## 2017-03-30 DIAGNOSIS — J441 Chronic obstructive pulmonary disease with (acute) exacerbation: Secondary | ICD-10-CM | POA: Diagnosis not present

## 2017-03-30 DIAGNOSIS — J9611 Chronic respiratory failure with hypoxia: Secondary | ICD-10-CM | POA: Diagnosis not present

## 2017-03-30 DIAGNOSIS — J209 Acute bronchitis, unspecified: Secondary | ICD-10-CM | POA: Diagnosis not present

## 2017-03-30 DIAGNOSIS — J440 Chronic obstructive pulmonary disease with acute lower respiratory infection: Secondary | ICD-10-CM | POA: Diagnosis not present

## 2017-03-31 ENCOUNTER — Ambulatory Visit (INDEPENDENT_AMBULATORY_CARE_PROVIDER_SITE_OTHER): Payer: Medicare Other | Admitting: Family Medicine

## 2017-03-31 ENCOUNTER — Other Ambulatory Visit: Payer: Self-pay | Admitting: Family Medicine

## 2017-03-31 ENCOUNTER — Encounter: Payer: Self-pay | Admitting: Family Medicine

## 2017-03-31 VITALS — BP 132/80 | HR 72 | Resp 16 | Ht 62.0 in | Wt 147.0 lb

## 2017-03-31 DIAGNOSIS — I472 Ventricular tachycardia, unspecified: Secondary | ICD-10-CM

## 2017-03-31 DIAGNOSIS — I5022 Chronic systolic (congestive) heart failure: Secondary | ICD-10-CM | POA: Diagnosis not present

## 2017-03-31 DIAGNOSIS — R110 Nausea: Secondary | ICD-10-CM | POA: Insufficient documentation

## 2017-03-31 DIAGNOSIS — R2681 Unsteadiness on feet: Secondary | ICD-10-CM | POA: Diagnosis not present

## 2017-03-31 DIAGNOSIS — J449 Chronic obstructive pulmonary disease, unspecified: Secondary | ICD-10-CM

## 2017-03-31 DIAGNOSIS — K219 Gastro-esophageal reflux disease without esophagitis: Secondary | ICD-10-CM | POA: Diagnosis not present

## 2017-03-31 DIAGNOSIS — I10 Essential (primary) hypertension: Secondary | ICD-10-CM | POA: Diagnosis not present

## 2017-03-31 DIAGNOSIS — E559 Vitamin D deficiency, unspecified: Secondary | ICD-10-CM

## 2017-03-31 DIAGNOSIS — J309 Allergic rhinitis, unspecified: Secondary | ICD-10-CM | POA: Diagnosis not present

## 2017-03-31 DIAGNOSIS — G894 Chronic pain syndrome: Secondary | ICD-10-CM | POA: Diagnosis not present

## 2017-03-31 MED ORDER — DULOXETINE HCL 60 MG PO CPEP: 60.0000 mg | ORAL_CAPSULE | Freq: Every day | ORAL | 2 refills | Status: DC

## 2017-03-31 MED ORDER — PROMETHAZINE HCL 25 MG PO TABS: 25.0000 mg | ORAL_TABLET | Freq: Four times a day (QID) | ORAL | 2 refills | Status: DC | PRN

## 2017-03-31 MED ORDER — ALBUTEROL SULFATE HFA 108 (90 BASE) MCG/ACT IN AERS: 2.0000 | INHALATION_SPRAY | Freq: Four times a day (QID) | RESPIRATORY_TRACT | Status: DC | PRN

## 2017-03-31 NOTE — Progress Notes (Deleted)
Date:  03/31/2017   Name:  [name] [name]   DOB:  November 16, 1945   MRN:  [id number]  PCP:  Schuyler Amor, MD    Chief Complaint: Establish Care and Pain (all over muscle pain...taking 4000 tylenol daily. Started in November )   History of Present Illness:  This is a 71 y.o. female seen for initial visit.   Review of Systems:  Review of Systems  Patient Active Problem List   Diagnosis Date Noted  . Gait instability 03/31/2017  . Chronic systolic heart failure (HCC) 03/24/2017  . HTN (hypertension) 03/24/2017  . COPD (chronic obstructive pulmonary disease) (HCC) 03/24/2017  . Chronic pain 03/24/2017  . MRSA carrier 03/18/2017  . Hyperglycemia 03/18/2017  . Demand ischemia (HCC) 03/18/2017  . Hypokalemia 03/18/2017  . Lactic acidosis 03/18/2017  . V tach (HCC) 03/18/2017  . Sepsis (HCC) 03/13/2017  . Admission for palliative care 03/10/2017  . Vitamin D deficiency 01/06/2017  . Anxiety 01/01/2017  . Chronic atrial fibrillation (HCC) 06/15/2016  . Depression 06/15/2016  . Wheezing   . Tobacco use disorder 03/24/2016  . Dyspnea 10/11/2011  . Varicose veins of lower extremity with inflammation 09/07/2011    Prior to Admission medications   Medication Sig Start Date End Date Taking? Authorizing Provider  acetaminophen (TYLENOL) 500 MG tablet Take 1,000 mg by mouth every 6 (six) hours as needed.   Yes [provider]  amiodarone (PACERONE) 200 MG tablet Take 200 mg by mouth daily.   Yes [provider]  aspirin 81 MG chewable tablet Chew 81 mg by mouth daily.   Yes [provider]  atorvastatin (LIPITOR) 40 MG tablet Take 40 mg by mouth daily. 02/15/17  Yes [provider]  benzonatate (TESSALON) 100 MG capsule Take 100 mg by mouth every 6 (six) hours as needed. 03/25/17  Yes [provider]  budesonide-formoterol (SYMBICORT) 160-4.5 MCG/ACT inhaler Inhale 2 puffs into the lungs 2 (two) times daily.   Yes [provider]   carvedilol (COREG) 25 MG tablet Take 25 mg by mouth 2 (two) times daily. 02/09/17  Yes [provider]  DULoxetine (CYMBALTA) 60 MG capsule Take 1 capsule (60 mg total) by mouth daily. 03/31/17  Yes Jaidan Stachnik, Chrissie Noa, MD  ferrous sulfate 325 (65 FE) MG tablet Take 1 tablet by mouth daily. 02/09/17  Yes [provider]  fluticasone (FLONASE) 50 MCG/ACT nasal spray Place 2 sprays into both nostrils daily.   Yes [provider]  furosemide (LASIX) 80 MG tablet Take 80 mg by mouth 2 (two) times daily. 02/16/17  Yes [provider]  losartan (COZAAR) 25 MG tablet Take 25 mg by mouth 2 (two) times daily. 02/15/17  Yes [provider]  omeprazole (PRILOSEC) 20 MG capsule Take 20 mg by mouth daily.   Yes [provider]  OXYGEN Inhale 2 L into the lungs.   Yes [provider]  potassium chloride SA (K-DUR,KLOR-CON) 20 MEQ tablet Take 1 tablet (20 mEq total) by mouth daily. 03/24/17  Yes Hackney, Tina A, FNP  promethazine (PHENERGAN) 25 MG tablet Take 25 mg by mouth every 6 (six) hours as needed.   Yes [provider]  albuterol (PROVENTIL HFA;VENTOLIN HFA) 108 (90 Base) MCG/ACT inhaler Inhale 2 puffs into the lungs every 6 (six) hours as needed for wheezing or shortness of breath. 03/31/17   Schuyler Amor, MD    Allergies  Allergen Reactions  . Sulfa Antibiotics Itching    Pt states she gets "itching inside  and out"    Past Surgical History:  Procedure Laterality Date  . REPLACEMENT TOTAL KNEE      Social History  Substance Use Topics  . Smoking status: Former Games developer  . Smokeless tobacco: Never Used  . Alcohol use No    Family History  Problem Relation Age of Onset  . Family history unknown: Yes    Medication list has been reviewed and updated.  Physical Examination: BP 132/80   Pulse 72   Resp 16   Ht 5\' 2"  (1.575 m)   Wt 147 lb (66.7 kg)   SpO2 100%   BMI 26.89 kg/m   Physical Exam  Assessment and Plan:  1.  Chronic systolic heart failure (HCC) *** - Comprehensive Metabolic Panel (CMET) - CBC - Lipid Profile - TSH  2. Essential hypertension ***  3. V tach (HCC) ***  4. Chronic bronchitis, unspecified chronic bronchitis type (HCC) ***  5. Chronic pain syndrome ***  6. Vitamin D deficiency *** - Vitamin D (25 hydroxy)  7. Gait instability *** - B12   Return in about 4 weeks (around 04/28/2017).  Dionne Ano. Kingsley Spittle MD [facility]  03/31/2017

## 2017-03-31 NOTE — Patient Instructions (Addendum)
Stop diphenhydramine (Benedryl) and citalopram (Celexa). Increase duloxetine (Cymbalta) to 60 mg daily. Call if breathing worsens off prednisone.

## 2017-03-31 NOTE — Progress Notes (Signed)
Date:  03/31/2017   Name:  [name] [name]   DOB:  May 02, 1946   MRN:  [id number]  PCP:  Schuyler Amor, MD    Chief Complaint: Establish Care and Pain (all over muscle pain...taking 4000 tylenol daily. Started in November )   History of Present Illness:  This is a 71 y.o. female seen for initial visit. Has been hospitalized almost monthly for CHF/COPD for past year. Was on [facility] hospice for six months, discharged in May. Now getting care at [facility]. Last hospitalized 03/13/17 for acute on chronic resp failure with hypoxia and sepsis, started on amiodarone for Vtach, down to 200 mg daily, and prednisone taper finishing today. EF 25-30% on echo. Daughter asks if needs to be on chronic prednisone, has not needed in past. Has CHF clinic f/u 10/1 and cards f/u 10/11. Hyperglycemia in hospital on steroids, a1c ok. Hypokalemia and hypotension resolved on d/c. Hx ARF/dehydration but retained fluid off Lasix, currently taking 80 mg bid, no current edema and breathing stable. Weight stable around 142#. C/o diffuse pain, chest, abdomen, legs, shoulders since November, changed Celexa to Cymbalta last month but not helping. Also c/o increased anxiety, grandson murdered in June. Quit smoking in May but considering restarting due to nerves. Using O2 2-3 l/min continuously. Takes Phenergan prn for nausea, using bid. Prilosec daily for GERD, sxs recur when off. For PE tube placement by ENT 9/24, taking Flonase and Benedryl daily for allergies. Needs flu and Pneumovax.  Review of Systems:  Review of Systems  Constitutional: Negative for chills and fever.  HENT: Negative for trouble swallowing.   Cardiovascular: Negative for leg swelling.  Gastrointestinal: Negative for constipation and diarrhea.  Genitourinary: Negative for difficulty urinating.  Neurological: Negative for syncope and light-headedness.  Psychiatric/Behavioral: Negative for agitation and confusion.    Patient Active Problem List   Diagnosis Date Noted   . Gait instability 03/31/2017  . Chronic systolic heart failure (HCC) 03/24/2017  . HTN (hypertension) 03/24/2017  . COPD (chronic obstructive pulmonary disease) (HCC) 03/24/2017  . Chronic pain 03/24/2017  . MRSA carrier 03/18/2017  . Hyperglycemia 03/18/2017  . Demand ischemia (HCC) 03/18/2017  . Hypokalemia 03/18/2017  . Lactic acidosis 03/18/2017  . V tach (HCC) 03/18/2017  . Sepsis (HCC) 03/13/2017  . Admission for palliative care 03/10/2017  . Vitamin D deficiency 01/06/2017  . Anxiety 01/01/2017  . Chronic atrial fibrillation (HCC) 06/15/2016  . Depression 06/15/2016  . Wheezing   . Tobacco use disorder 03/24/2016  . Dyspnea 10/11/2011  . Varicose veins of lower extremity with inflammation 09/07/2011    Prior to Admission medications   Medication Sig Start Date End Date Taking? Authorizing Provider  acetaminophen (TYLENOL) 500 MG tablet Take 1,000 mg by mouth every 6 (six) hours as needed.   Yes [provider]  amiodarone (PACERONE) 200 MG tablet Take 200 mg by mouth daily.   Yes [provider]  aspirin 81 MG chewable tablet Chew 81 mg by mouth daily.   Yes [provider]  atorvastatin (LIPITOR) 40 MG tablet Take 40 mg by mouth daily. 02/15/17  Yes [provider]  benzonatate (TESSALON) 100 MG capsule Take 100 mg by mouth every 6 (six) hours as needed. 03/25/17  Yes [provider]  budesonide-formoterol (SYMBICORT) 160-4.5 MCG/ACT inhaler Inhale 2 puffs into the lungs 2 (two) times daily.   Yes [provider]  carvedilol (COREG) 25 MG tablet Take 25 mg by mouth 2 (two) times daily. 02/09/17  Yes [provider]  DULoxetine (CYMBALTA) 60 MG capsule Take 1 capsule (60 mg total) by mouth daily. 03/31/17  Yes Ozelle Brubacher, Chrissie Noa, MD  ferrous sulfate 325 (65 FE) MG tablet Take 1 tablet by mouth daily. 02/09/17  Yes [provider]  fluticasone (FLONASE) 50 MCG/ACT nasal spray Place 2 sprays into both nostrils  daily.   Yes [provider]  furosemide (LASIX) 80 MG tablet Take 80 mg by mouth 2 (two) times daily. 02/16/17  Yes [provider]  losartan (COZAAR) 25 MG tablet Take 25 mg by mouth 2 (two) times daily. 02/15/17  Yes [provider]  omeprazole (PRILOSEC) 20 MG capsule Take 20 mg by mouth daily.   Yes [provider]  OXYGEN Inhale 2 L into the lungs.   Yes [provider]  potassium chloride SA (K-DUR,KLOR-CON) 20 MEQ tablet Take 1 tablet (20 mEq total) by mouth daily. 03/24/17  Yes Clarisa Kindred A, FNP  promethazine (PHENERGAN) 25 MG tablet Take 1 tablet (25 mg total) by mouth every 6 (six) hours as needed. 03/31/17  Yes Jazmina Muhlenkamp, Chrissie Noa, MD  albuterol (PROVENTIL HFA;VENTOLIN HFA) 108 (90 Base) MCG/ACT inhaler Inhale 2 puffs into the lungs every 6 (six) hours as needed for wheezing or shortness of breath. 03/31/17   Schuyler Amor, MD    Allergies  Allergen Reactions  . Sulfa Antibiotics Itching    Pt states she gets "itching inside and out"    Past Surgical History:  Procedure Laterality Date  . REPLACEMENT TOTAL KNEE      Social History  Substance Use Topics  . Smoking status: Former Games developer  . Smokeless tobacco: Never Used  . Alcohol use No    Family History  Problem Relation Age of Onset  . Family history unknown: Yes    Medication list has been reviewed and updated.  Physical Examination: BP 132/80   Pulse 72   Resp 16   Ht 5\' 2"  (1.575 m)   Wt 147 lb (66.7 kg)   SpO2 100%   BMI 26.89 kg/m   Physical Exam  Constitutional: She is oriented to person, place, and time. She appears well-developed and well-nourished. No distress.  HENT:  Head: Normocephalic and atraumatic.  Right Ear: External ear normal.  Left Ear: External ear normal.  Nose: Nose normal.  Mouth/Throat: Oropharynx is clear and moist.  TMs clear  Eyes: Pupils are equal, round, and reactive to light. Conjunctivae and EOM are normal. No scleral icterus.   Neck: Neck supple. No thyromegaly present.  Cardiovascular: Normal rate, regular rhythm and normal heart sounds.   Pulmonary/Chest: Effort normal and breath sounds normal.  Abdominal: Soft. She exhibits no distension and no mass. There is no tenderness.  Musculoskeletal: She exhibits no edema.  Lymphadenopathy:    She has no cervical adenopathy.  Neurological: She is alert and oriented to person, place, and time. Coordination normal.  Romberg positive, gait unsteady  Skin: Skin is warm and dry. She is not diaphoretic.  Psychiatric: She has a normal mood and affect. Her behavior is normal.  Nursing note and vitals reviewed.   Assessment and Plan:  1. Chronic systolic heart failure (HCC) Stable on current regimen, cont Coreg/losartan/Lasix, for cards f/u next month - Comprehensive Metabolic Panel (CMET) - CBC - Lipid Profile - TSH  2. Chronic obstructive pulmonary disease, unspecified COPD type (HCC) Stable off prednisone taper on Symbicort, consider chronic prednisone (10 mg daily) if sxs worsen  3. Essential hypertension Well controlled on current meds  4. Chronic pain syndrome  Unclear etiology, statin could be contributing, increase Cymbalta to 60 mg daily  5. V tach (HCC) On amiodarone, for cards f/u next month, consider decrease to 100 mg daily  6. Allergic rhinitis, unspecified seasonality, unspecified trigger Cont Flonase, d/c Benedryl, consider Claritin/Zyrtec if sxs worsen  7. Gait instability S/p rehab, home PT - B12  8. Nausea Unclear etiology, iron and amiodarone may be contributing, refill Phenergan prn  9. Gastroesophageal reflux disease, esophagitis presence not specified On chronic PPI with sx recurrence on stopping, continue for now  10. Vitamin D deficiency Per chart, no current supplement - Vitamin D (25 hydroxy)  11. Med review Consider d/c iron, Prilosec, Phenergan, amiodarone in future  12. HM Needs flu/Pneumovax next visit  Return in  about 4 weeks (around 04/28/2017).   45 mins spent with pt/daughter over half in counseling  Dionne Ano. Kingsley Spittle MD [facility]  03/31/2017

## 2017-04-01 DIAGNOSIS — I420 Dilated cardiomyopathy: Secondary | ICD-10-CM | POA: Diagnosis not present

## 2017-04-01 DIAGNOSIS — J440 Chronic obstructive pulmonary disease with acute lower respiratory infection: Secondary | ICD-10-CM | POA: Diagnosis not present

## 2017-04-01 DIAGNOSIS — J209 Acute bronchitis, unspecified: Secondary | ICD-10-CM | POA: Diagnosis not present

## 2017-04-01 DIAGNOSIS — I2510 Atherosclerotic heart disease of native coronary artery without angina pectoris: Secondary | ICD-10-CM | POA: Diagnosis not present

## 2017-04-01 DIAGNOSIS — J9611 Chronic respiratory failure with hypoxia: Secondary | ICD-10-CM | POA: Diagnosis not present

## 2017-04-01 DIAGNOSIS — J441 Chronic obstructive pulmonary disease with (acute) exacerbation: Secondary | ICD-10-CM | POA: Diagnosis not present

## 2017-04-01 LAB — COMPREHENSIVE METABOLIC PANEL
ALK PHOS: 58 IU/L (ref 39–117)
ALT: 14 IU/L (ref 0–32)
AST: 13 IU/L (ref 0–40)
Albumin/Globulin Ratio: 1.9 (ref 1.2–2.2)
Albumin: 4 g/dL (ref 3.5–4.8)
BUN/Creatinine Ratio: 18 (ref 12–28)
BUN: 17 mg/dL (ref 8–27)
Bilirubin Total: 0.5 mg/dL (ref 0.0–1.2)
CALCIUM: 8.9 mg/dL (ref 8.7–10.3)
CO2: 27 mmol/L (ref 20–29)
CREATININE: 0.95 mg/dL (ref 0.57–1.00)
Chloride: 102 mmol/L (ref 96–106)
GFR calc Af Amer: 69 mL/min/{1.73_m2} (ref >59)
GFR calc non Af Amer: 60 mL/min/{1.73_m2} (ref >59)
GLOBULIN, TOTAL: 2.1 g/dL (ref 1.5–4.5)
Glucose: 91 mg/dL (ref 65–99)
Potassium: 4.9 mmol/L (ref 3.5–5.2)
SODIUM: 145 mmol/L — AB (ref 134–144)
Total Protein: 6.1 g/dL (ref 6.0–8.5)

## 2017-04-01 LAB — CBC
Hematocrit: 36.3 % (ref 34.0–46.6)
Hemoglobin: 11.9 g/dL (ref 11.1–15.9)
MCH: 28.7 pg (ref 26.6–33.0)
MCHC: 32.8 g/dL (ref 31.5–35.7)
MCV: 88 fL (ref 79–97)
PLATELETS: 152 10*3/uL (ref 150–379)
RBC: 4.15 x10E6/uL (ref 3.77–5.28)
RDW: 15.5 % — ABNORMAL HIGH (ref 12.3–15.4)
WBC: 9.4 10*3/uL (ref 3.4–10.8)

## 2017-04-01 LAB — LIPID PANEL
CHOL/HDL RATIO: 2.3 ratio (ref 0.0–4.4)
Cholesterol, Total: 167 mg/dL (ref 100–199)
HDL: 74 mg/dL (ref >39)
LDL CALC: 68 mg/dL (ref 0–99)
Triglycerides: 127 mg/dL (ref 0–149)
VLDL Cholesterol Cal: 25 mg/dL (ref 5–40)

## 2017-04-01 LAB — TSH: TSH: 1.14 u[IU]/mL (ref 0.450–4.500)

## 2017-04-01 LAB — VITAMIN B12: Vitamin B-12: 236 pg/mL (ref 232–1245)

## 2017-04-01 LAB — VITAMIN D 25 HYDROXY (VIT D DEFICIENCY, FRACTURES): Vit D, 25-Hydroxy: 22.2 ng/mL — ABNORMAL LOW (ref 30.0–100.0)

## 2017-04-05 ENCOUNTER — Other Ambulatory Visit: Payer: Self-pay | Admitting: Family Medicine

## 2017-04-05 MED ORDER — VITAMIN D3 25 MCG (1000 UT) PO CAPS: 1.0000 | ORAL_CAPSULE | Freq: Every day | ORAL | Status: DC

## 2017-04-05 MED ORDER — B-12 1000 MCG PO TABS: 1.0000 | ORAL_TABLET | Freq: Every day | ORAL | Status: AC

## 2017-04-05 MED ORDER — AMIODARONE HCL 100 MG PO TABS: 100.0000 mg | ORAL_TABLET | Freq: Every day | ORAL | Status: DC

## 2017-04-06 DIAGNOSIS — G894 Chronic pain syndrome: Secondary | ICD-10-CM | POA: Diagnosis not present

## 2017-04-06 DIAGNOSIS — F320 Major depressive disorder, single episode, mild: Secondary | ICD-10-CM | POA: Diagnosis not present

## 2017-04-06 DIAGNOSIS — J440 Chronic obstructive pulmonary disease with acute lower respiratory infection: Secondary | ICD-10-CM | POA: Diagnosis not present

## 2017-04-06 DIAGNOSIS — Z515 Encounter for palliative care: Secondary | ICD-10-CM | POA: Diagnosis not present

## 2017-04-06 DIAGNOSIS — F17200 Nicotine dependence, unspecified, uncomplicated: Secondary | ICD-10-CM | POA: Diagnosis not present

## 2017-04-06 DIAGNOSIS — I5022 Chronic systolic (congestive) heart failure: Secondary | ICD-10-CM | POA: Diagnosis not present

## 2017-04-06 DIAGNOSIS — I2510 Atherosclerotic heart disease of native coronary artery without angina pectoris: Secondary | ICD-10-CM | POA: Diagnosis not present

## 2017-04-06 DIAGNOSIS — J449 Chronic obstructive pulmonary disease, unspecified: Secondary | ICD-10-CM | POA: Diagnosis not present

## 2017-04-06 DIAGNOSIS — J9611 Chronic respiratory failure with hypoxia: Secondary | ICD-10-CM | POA: Diagnosis not present

## 2017-04-06 DIAGNOSIS — J441 Chronic obstructive pulmonary disease with (acute) exacerbation: Secondary | ICD-10-CM | POA: Diagnosis not present

## 2017-04-06 DIAGNOSIS — J209 Acute bronchitis, unspecified: Secondary | ICD-10-CM | POA: Diagnosis not present

## 2017-04-06 DIAGNOSIS — I420 Dilated cardiomyopathy: Secondary | ICD-10-CM | POA: Diagnosis not present

## 2017-04-07 ENCOUNTER — Other Ambulatory Visit: Payer: Self-pay

## 2017-04-07 ENCOUNTER — Ambulatory Visit: Payer: Medicare Other | Admitting: Nurse Practitioner

## 2017-04-07 ENCOUNTER — Other Ambulatory Visit: Payer: Self-pay | Admitting: Family Medicine

## 2017-04-07 MED ORDER — LOSARTAN POTASSIUM 25 MG PO TABS: 25.0000 mg | ORAL_TABLET | Freq: Two times a day (BID) | ORAL | 2 refills | Status: DC

## 2017-04-07 MED ORDER — AMIODARONE HCL 400 MG PO TABS: 200.0000 mg | ORAL_TABLET | Freq: Every day | ORAL | 2 refills | Status: DC

## 2017-04-07 MED ORDER — AMIODARONE HCL 400 MG PO TABS: ORAL_TABLET | ORAL | 3 refills | Status: DC

## 2017-04-07 MED ORDER — CARVEDILOL 25 MG PO TABS: 25.0000 mg | ORAL_TABLET | Freq: Two times a day (BID) | ORAL | 2 refills | Status: DC

## 2017-04-07 NOTE — Telephone Encounter (Signed)
Done

## 2017-04-07 NOTE — Telephone Encounter (Signed)
WANTS REFILL lOSARTAN AND CARVEDILOL.

## 2017-04-10 ENCOUNTER — Other Ambulatory Visit: Payer: Self-pay | Admitting: Family Medicine

## 2017-04-12 MED ORDER — ALBUTEROL SULFATE HFA 108 (90 BASE) MCG/ACT IN AERS: 2.0000 | INHALATION_SPRAY | Freq: Four times a day (QID) | RESPIRATORY_TRACT | 3 refills | Status: DC | PRN

## 2017-04-16 DIAGNOSIS — J209 Acute bronchitis, unspecified: Secondary | ICD-10-CM | POA: Diagnosis not present

## 2017-04-16 DIAGNOSIS — I420 Dilated cardiomyopathy: Secondary | ICD-10-CM | POA: Diagnosis not present

## 2017-04-16 DIAGNOSIS — I2510 Atherosclerotic heart disease of native coronary artery without angina pectoris: Secondary | ICD-10-CM | POA: Diagnosis not present

## 2017-04-16 DIAGNOSIS — J440 Chronic obstructive pulmonary disease with acute lower respiratory infection: Secondary | ICD-10-CM | POA: Diagnosis not present

## 2017-04-16 DIAGNOSIS — J441 Chronic obstructive pulmonary disease with (acute) exacerbation: Secondary | ICD-10-CM | POA: Diagnosis not present

## 2017-04-16 DIAGNOSIS — J9611 Chronic respiratory failure with hypoxia: Secondary | ICD-10-CM | POA: Diagnosis not present

## 2017-04-17 DIAGNOSIS — I420 Dilated cardiomyopathy: Secondary | ICD-10-CM | POA: Diagnosis not present

## 2017-04-17 DIAGNOSIS — J209 Acute bronchitis, unspecified: Secondary | ICD-10-CM | POA: Diagnosis not present

## 2017-04-17 DIAGNOSIS — I2510 Atherosclerotic heart disease of native coronary artery without angina pectoris: Secondary | ICD-10-CM | POA: Diagnosis not present

## 2017-04-17 DIAGNOSIS — J9611 Chronic respiratory failure with hypoxia: Secondary | ICD-10-CM | POA: Diagnosis not present

## 2017-04-17 DIAGNOSIS — J440 Chronic obstructive pulmonary disease with acute lower respiratory infection: Secondary | ICD-10-CM | POA: Diagnosis not present

## 2017-04-17 DIAGNOSIS — J441 Chronic obstructive pulmonary disease with (acute) exacerbation: Secondary | ICD-10-CM | POA: Diagnosis not present

## 2017-04-19 DIAGNOSIS — H6523 Chronic serous otitis media, bilateral: Secondary | ICD-10-CM | POA: Diagnosis not present

## 2017-04-20 DIAGNOSIS — J209 Acute bronchitis, unspecified: Secondary | ICD-10-CM | POA: Diagnosis not present

## 2017-04-20 DIAGNOSIS — J441 Chronic obstructive pulmonary disease with (acute) exacerbation: Secondary | ICD-10-CM | POA: Diagnosis not present

## 2017-04-20 DIAGNOSIS — J9611 Chronic respiratory failure with hypoxia: Secondary | ICD-10-CM | POA: Diagnosis not present

## 2017-04-20 DIAGNOSIS — J440 Chronic obstructive pulmonary disease with acute lower respiratory infection: Secondary | ICD-10-CM | POA: Diagnosis not present

## 2017-04-20 DIAGNOSIS — I420 Dilated cardiomyopathy: Secondary | ICD-10-CM | POA: Diagnosis not present

## 2017-04-20 DIAGNOSIS — I2510 Atherosclerotic heart disease of native coronary artery without angina pectoris: Secondary | ICD-10-CM | POA: Diagnosis not present

## 2017-04-23 ENCOUNTER — Telehealth: Payer: Self-pay

## 2017-04-23 DIAGNOSIS — J9611 Chronic respiratory failure with hypoxia: Secondary | ICD-10-CM | POA: Diagnosis not present

## 2017-04-23 DIAGNOSIS — J441 Chronic obstructive pulmonary disease with (acute) exacerbation: Secondary | ICD-10-CM | POA: Diagnosis not present

## 2017-04-23 DIAGNOSIS — I2510 Atherosclerotic heart disease of native coronary artery without angina pectoris: Secondary | ICD-10-CM | POA: Diagnosis not present

## 2017-04-23 DIAGNOSIS — I420 Dilated cardiomyopathy: Secondary | ICD-10-CM | POA: Diagnosis not present

## 2017-04-23 DIAGNOSIS — J209 Acute bronchitis, unspecified: Secondary | ICD-10-CM | POA: Diagnosis not present

## 2017-04-23 DIAGNOSIS — J440 Chronic obstructive pulmonary disease with acute lower respiratory infection: Secondary | ICD-10-CM | POA: Diagnosis not present

## 2017-04-23 NOTE — Telephone Encounter (Signed)
REQUESTING REFILL fEUROSIMIDE 60 MG

## 2017-04-25 ENCOUNTER — Other Ambulatory Visit: Payer: Self-pay | Admitting: Family Medicine

## 2017-04-25 MED ORDER — FUROSEMIDE 80 MG PO TABS: 80.0000 mg | ORAL_TABLET | Freq: Two times a day (BID) | ORAL | 2 refills | Status: DC

## 2017-04-25 NOTE — Telephone Encounter (Signed)
Done

## 2017-04-26 ENCOUNTER — Ambulatory Visit: Payer: Medicare Other | Attending: Family | Admitting: Family

## 2017-04-26 ENCOUNTER — Encounter: Payer: Self-pay | Admitting: Family

## 2017-04-26 VITALS — BP 139/59 | HR 69 | Resp 18 | Ht 62.0 in | Wt 156.1 lb

## 2017-04-26 DIAGNOSIS — G894 Chronic pain syndrome: Secondary | ICD-10-CM

## 2017-04-26 DIAGNOSIS — I5022 Chronic systolic (congestive) heart failure: Secondary | ICD-10-CM

## 2017-04-26 DIAGNOSIS — I10 Essential (primary) hypertension: Secondary | ICD-10-CM

## 2017-04-26 DIAGNOSIS — J449 Chronic obstructive pulmonary disease, unspecified: Secondary | ICD-10-CM | POA: Insufficient documentation

## 2017-04-26 DIAGNOSIS — I509 Heart failure, unspecified: Secondary | ICD-10-CM | POA: Insufficient documentation

## 2017-04-26 DIAGNOSIS — Z7982 Long term (current) use of aspirin: Secondary | ICD-10-CM | POA: Diagnosis not present

## 2017-04-26 DIAGNOSIS — I110 Hypertensive heart disease with heart failure: Secondary | ICD-10-CM | POA: Insufficient documentation

## 2017-04-26 DIAGNOSIS — Z79899 Other long term (current) drug therapy: Secondary | ICD-10-CM | POA: Diagnosis not present

## 2017-04-26 DIAGNOSIS — G8929 Other chronic pain: Secondary | ICD-10-CM | POA: Insufficient documentation

## 2017-04-26 DIAGNOSIS — Z87891 Personal history of nicotine dependence: Secondary | ICD-10-CM | POA: Insufficient documentation

## 2017-04-26 MED ORDER — ATORVASTATIN CALCIUM 40 MG PO TABS: 40.0000 mg | ORAL_TABLET | Freq: Every day | ORAL | 5 refills | Status: DC

## 2017-04-26 NOTE — Patient Instructions (Signed)
Continue weighing daily and call for an overnight weight gain of > 2 pounds or a weekly weight gain of >5 pounds. 

## 2017-04-26 NOTE — Progress Notes (Signed)
Patient ID: [name] [name], female    DOB: January 30, 1946, 71 y.o.   MRN: [id number]  HPI  [name] [name] is a 71 y/o female with a history of HTN, COPD, chronic pain, previous tobacco use and chronic heart failure.   Echo report from 03/15/17 reviewed and showed an EF of 25-30% along with severe MR. This is unchanged from [facility] November 2017.  Admitted 03/13/17 due to sepsis and HF. Treated with antibiotics. Cardiology consult obtained. Initially needed IV diuretics and transitioned to oral diuretics with net loss of ~6.6 liters of fluid. Oxygen continued. Discharged home after 5 days.  She presents today for her follow-up visit with a chief complaint of moderate shortness of breath with minimal exertion. She describes this as chronic in nature having been present for several years with varying levels of severity. She has associated fatigue and occasional chest tightness along with this. She denies any edema or weight gain. Also complaining of severe pain around her right ear radiating down her neck and up into her head. Has had recent tubes in her ears placed 1 weeks ago.   Past Medical History:  Diagnosis Date  . CHF (congestive heart failure) (HCC)   . COPD (chronic obstructive pulmonary disease) (HCC)   . Hypertension   . Lung nodule    Past Surgical History:  Procedure Laterality Date  . REPLACEMENT TOTAL KNEE     No family history on file. Social History  Substance Use Topics  . Smoking status: Former Games developer  . Smokeless tobacco: Never Used  . Alcohol use No   Allergies  Allergen Reactions  . Sulfa Antibiotics Itching   Prior to Admission medications   Medication Sig Start Date End Date Taking? Authorizing Provider  acetaminophen (TYLENOL) 500 MG tablet Take 1,000 mg by mouth every 6 (six) hours as needed.   Yes [provider]  albuterol (PROVENTIL HFA;VENTOLIN HFA) 108 (90 Base) MCG/ACT inhaler Inhale 2 puffs into the lungs every 6 (six) hours as needed for wheezing or  shortness of breath. 04/12/17  Yes Plonk, Chrissie Noa, MD  amiodarone (PACERONE) 400 MG tablet Take 0.5 tablets (200 mg total) by mouth daily. 04/07/17  Yes Plonk, Chrissie Noa, MD  aspirin 81 MG chewable tablet Chew 81 mg by mouth daily.   Yes [provider]  atorvastatin (LIPITOR) 40 MG tablet Take 40 mg by mouth daily. 02/15/17  Yes [provider]  benzonatate (TESSALON) 100 MG capsule Take 100 mg by mouth every 6 (six) hours as needed. 03/25/17  Yes [provider]  budesonide-formoterol (SYMBICORT) 160-4.5 MCG/ACT inhaler Inhale 2 puffs into the lungs 2 (two) times daily.   Yes [provider]  carvedilol (COREG) 25 MG tablet Take 1 tablet (25 mg total) by mouth 2 (two) times daily. 04/07/17  Yes Plonk, Chrissie Noa, MD  Cholecalciferol (VITAMIN D3) 1000 units CAPS Take 1 capsule (1,000 Units total) by mouth daily. 04/05/17  Yes Plonk, Chrissie Noa, MD  Cyanocobalamin (B-12) 1000 MCG TABS Take 1 tablet by mouth daily. 04/05/17  Yes Plonk, Chrissie Noa, MD  DULoxetine (CYMBALTA) 60 MG capsule Take 1 capsule (60 mg total) by mouth daily. 03/31/17  Yes Plonk, Chrissie Noa, MD  fluticasone (FLONASE) 50 MCG/ACT nasal spray Place 2 sprays into both nostrils daily.   Yes [provider]  furosemide (LASIX) 80 MG tablet Take 1 tablet (80 mg total) by mouth 2 (two) times daily. 04/25/17  Yes Plonk, Chrissie Noa, MD  losartan (COZAAR) 25 MG tablet Take 1 tablet (25 mg total) by  mouth 2 (two) times daily. 04/07/17  Yes Plonk, Chrissie Noa, MD  omeprazole (PRILOSEC) 20 MG capsule Take 20 mg by mouth daily.   Yes [provider]  OXYGEN Inhale 2 L into the lungs.   Yes [provider]  potassium chloride SA (K-DUR,KLOR-CON) 20 MEQ tablet Take 1 tablet (20 mEq total) by mouth daily. 03/24/17  Yes Clarisa Kindred A, FNP  promethazine (PHENERGAN) 25 MG tablet Take 1 tablet (25 mg total) by mouth every 6 (six) hours as needed. 03/31/17  Yes Plonk, Chrissie Noa, MD    Review of Systems  Constitutional:  Positive for fatigue. Negative for appetite change.  HENT: Positive for ear pain and hearing loss. Negative for congestion, postnasal drip and sore throat.   Eyes: Negative.   Respiratory: Positive for chest tightness and shortness of breath. Negative for cough and wheezing.   Cardiovascular:  Negative for chest pain, palpitations and leg swelling.  Gastrointestinal: Negative for abdominal distention and pain Endocrine: Negative.   Genitourinary: Negative.   Musculoskeletal: Positive for back pain. Negative for neck pain.  Skin: Negative.   Allergic/Immunologic: Negative.   Neurological: Positive for weakness, light-headedness. Negative for dizziness, headaches.  Hematological: Negative for adenopathy. Bruises/bleeds easily.  Psychiatric/Behavioral: Positive for sleep disturbance (not sleeping well; unsure of why). Negative for dysphoric mood. The patient is not nervous/anxious.    Vitals:   04/26/17 1343  BP: (!) 139/59  Pulse: 69  Resp: 18  SpO2: 100%  Weight: 156 lb 2 oz (70.8 kg)  Height:  (1.575 m)   Wt Readings from Last 3 Encounters:  04/26/17 156 lb 2 oz (70.8 kg)  03/31/17 147 lb (66.7 kg)  03/24/17 141 lb 4 oz (64.1 kg)    Lab Results  Component Value Date   CREATININE 0.73 03/18/2017   CREATININE 0.84 03/17/2017   CREATININE 0.78 03/14/2017   Physical Exam  Constitutional: She is oriented to person, place, and time. She appears well-developed and well-nourished.  HENT:  Head: Normocephalic and atraumatic.  Right Ear: Decreased hearing is noted.  Left Ear: Decreased hearing is noted.  Neck: Normal range of motion. Neck supple. No JVD present.  Cardiovascular: Normal rate and regular rhythm.   Pulmonary/Chest: Effort normal and breath sounds normal.  Abdominal: Soft. She exhibits no distension. There is no tenderness.  Musculoskeletal: She exhibits no edema or tenderness.  Neurological: She is alert and oriented to person, place, and time.  Skin: Skin is  warm and dry.  Psychiatric: She has a normal mood and affect. Her behavior is normal. Thought content normal.  Nursing note and vitals reviewed.   Assessment & Plan:  1: Chronic heart failure with reduced ejection fraction- - NYHA class III - euvolemic today - weighing daily and she was reminded to call for an overnight weight gain of >2 pounds or a weekly weight gain of >5 pounds - by our scale, she's lost 5 pounds since she was last here - adds salt "sometimes to eggs". Reminded to closely follow a  sodium diet - has a home health nurse coming twice a week from [facility] - could consider changing her losartan to entresto in the future; patient not interested today - sees cardiology Brion Aliment) 05/06/17  2: HTN- - BP looks good today - saw PCP (Plonk) on 03/31/17 - BMP done 03/31/17 reviewed and shows sodium 145, potassium 4.9 and GFR 69  3: COPD- - wears oxygen at 2-3L around the clock  4: Chronic pain- - currently on duloxetine which she  says isn't helping "at all" - reporting "a lot" of pain around her right ear, down the right side of her neck and into her head. Says that it's been present since she had tubes put in her ears a week ago. She called ENT who placed her on prednisone which she says "isn't helping at all". Says that she's going to quit taking the prednisone. Encouraged her to call ENT back and let them know the prednisone didn't help to see if there's something else that they can give her.   Medication list was reviewed.  Return here in 3 months or sooner for any questions/problems before then.

## 2017-04-27 DIAGNOSIS — J440 Chronic obstructive pulmonary disease with acute lower respiratory infection: Secondary | ICD-10-CM | POA: Diagnosis not present

## 2017-04-27 DIAGNOSIS — I2510 Atherosclerotic heart disease of native coronary artery without angina pectoris: Secondary | ICD-10-CM | POA: Diagnosis not present

## 2017-04-27 DIAGNOSIS — J441 Chronic obstructive pulmonary disease with (acute) exacerbation: Secondary | ICD-10-CM | POA: Diagnosis not present

## 2017-04-27 DIAGNOSIS — J9611 Chronic respiratory failure with hypoxia: Secondary | ICD-10-CM | POA: Diagnosis not present

## 2017-04-27 DIAGNOSIS — J209 Acute bronchitis, unspecified: Secondary | ICD-10-CM | POA: Diagnosis not present

## 2017-04-27 DIAGNOSIS — I420 Dilated cardiomyopathy: Secondary | ICD-10-CM | POA: Diagnosis not present

## 2017-04-28 DIAGNOSIS — J9611 Chronic respiratory failure with hypoxia: Secondary | ICD-10-CM | POA: Diagnosis not present

## 2017-04-28 DIAGNOSIS — J441 Chronic obstructive pulmonary disease with (acute) exacerbation: Secondary | ICD-10-CM | POA: Diagnosis not present

## 2017-04-28 DIAGNOSIS — I420 Dilated cardiomyopathy: Secondary | ICD-10-CM | POA: Diagnosis not present

## 2017-04-28 DIAGNOSIS — J440 Chronic obstructive pulmonary disease with acute lower respiratory infection: Secondary | ICD-10-CM | POA: Diagnosis not present

## 2017-04-28 DIAGNOSIS — I2510 Atherosclerotic heart disease of native coronary artery without angina pectoris: Secondary | ICD-10-CM | POA: Diagnosis not present

## 2017-04-28 DIAGNOSIS — J209 Acute bronchitis, unspecified: Secondary | ICD-10-CM | POA: Diagnosis not present

## 2017-04-29 DIAGNOSIS — J9611 Chronic respiratory failure with hypoxia: Secondary | ICD-10-CM | POA: Diagnosis not present

## 2017-04-29 DIAGNOSIS — J441 Chronic obstructive pulmonary disease with (acute) exacerbation: Secondary | ICD-10-CM | POA: Diagnosis not present

## 2017-04-29 DIAGNOSIS — J209 Acute bronchitis, unspecified: Secondary | ICD-10-CM | POA: Diagnosis not present

## 2017-04-29 DIAGNOSIS — I420 Dilated cardiomyopathy: Secondary | ICD-10-CM | POA: Diagnosis not present

## 2017-04-29 DIAGNOSIS — J440 Chronic obstructive pulmonary disease with acute lower respiratory infection: Secondary | ICD-10-CM | POA: Diagnosis not present

## 2017-04-29 DIAGNOSIS — I2510 Atherosclerotic heart disease of native coronary artery without angina pectoris: Secondary | ICD-10-CM | POA: Diagnosis not present

## 2017-04-30 ENCOUNTER — Encounter: Payer: Self-pay | Admitting: Family Medicine

## 2017-04-30 ENCOUNTER — Ambulatory Visit (INDEPENDENT_AMBULATORY_CARE_PROVIDER_SITE_OTHER): Payer: Medicare Other | Admitting: Family Medicine

## 2017-04-30 VITALS — BP 138/84 | HR 77 | Resp 16 | Ht 62.0 in | Wt 161.0 lb

## 2017-04-30 DIAGNOSIS — E538 Deficiency of other specified B group vitamins: Secondary | ICD-10-CM

## 2017-04-30 DIAGNOSIS — J449 Chronic obstructive pulmonary disease, unspecified: Secondary | ICD-10-CM | POA: Diagnosis not present

## 2017-04-30 DIAGNOSIS — E559 Vitamin D deficiency, unspecified: Secondary | ICD-10-CM | POA: Diagnosis not present

## 2017-04-30 DIAGNOSIS — I5022 Chronic systolic (congestive) heart failure: Secondary | ICD-10-CM

## 2017-04-30 DIAGNOSIS — I10 Essential (primary) hypertension: Secondary | ICD-10-CM | POA: Diagnosis not present

## 2017-04-30 DIAGNOSIS — M5136 Other intervertebral disc degeneration, lumbar region: Secondary | ICD-10-CM

## 2017-04-30 DIAGNOSIS — R110 Nausea: Secondary | ICD-10-CM | POA: Diagnosis not present

## 2017-04-30 DIAGNOSIS — F329 Major depressive disorder, single episode, unspecified: Secondary | ICD-10-CM | POA: Diagnosis not present

## 2017-04-30 DIAGNOSIS — F32A Depression, unspecified: Secondary | ICD-10-CM

## 2017-04-30 DIAGNOSIS — Z23 Encounter for immunization: Secondary | ICD-10-CM

## 2017-04-30 DIAGNOSIS — K219 Gastro-esophageal reflux disease without esophagitis: Secondary | ICD-10-CM | POA: Diagnosis not present

## 2017-04-30 MED ORDER — ACETAMINOPHEN 500 MG PO TABS: 1000.0000 mg | ORAL_TABLET | Freq: Three times a day (TID) | ORAL | Status: DC | PRN

## 2017-04-30 MED ORDER — GABAPENTIN 300 MG PO CAPS: 300.0000 mg | ORAL_CAPSULE | Freq: Every day | ORAL | 2 refills | Status: DC

## 2017-04-30 MED ORDER — AMIODARONE HCL 200 MG PO TABS: 100.0000 mg | ORAL_TABLET | Freq: Every day | ORAL | 2 refills | Status: DC

## 2017-04-30 MED ORDER — DULOXETINE HCL 30 MG PO CPEP: 30.0000 mg | ORAL_CAPSULE | Freq: Every day | ORAL | 2 refills | Status: DC

## 2017-04-30 NOTE — Progress Notes (Signed)
Date:  04/30/2017   Name:  [name] [name]   DOB:  03-09-1946   MRN:  [id number]  PCP:  Schuyler Amor, MD    Chief Complaint: Hypertension (follow up- Says scales at home read 144 this morning. Last OV 156 and today on our scale it is 161 lb)   History of Present Illness:  This is a 71 y.o. female seen for one month f/u from initial visit. CHF sxs stable, seen by cards 4d ago, no med changes made, weight up 5# past month. COPD sxs stable, on final day of prednisone from ENT. C/o widespread pain in ears, back, knees, legs, and chest. Recently dad tubes placed in ears by ENT. C/o back pain radiating to abdomen, worse with standing/walking, known lumbar DDD. Cymbalta increased last vist but says not helping pain and making nausea worse. GERD well controlled on daily PPI, taking B12 and vit D supps, off iron. Insomnia poorly controlled on melatonin.  Review of Systems:  Review of Systems  Constitutional: Negative for chills and fever.  Respiratory: Negative for cough and shortness of breath.   Cardiovascular: Negative for chest pain.  Gastrointestinal: Negative for constipation and diarrhea.  Genitourinary: Negative for difficulty urinating and dysuria.  Neurological: Negative for syncope and light-headedness.    Patient Active Problem List   Diagnosis Date Noted  . DDD (degenerative disc disease), lumbar 04/30/2017  . B12 deficiency 04/30/2017  . Gait instability 03/31/2017  . Allergic rhinitis 03/31/2017  . Nausea 03/31/2017  . GERD (gastroesophageal reflux disease) 03/31/2017  . Chronic systolic heart failure (HCC) 03/24/2017  . HTN (hypertension) 03/24/2017  . COPD (chronic obstructive pulmonary disease) (HCC) 03/24/2017  . Chronic pain 03/24/2017  . MRSA carrier 03/18/2017  . Hyperglycemia 03/18/2017  . Hypokalemia 03/18/2017  . V tach (HCC) 03/18/2017  . Sepsis (HCC) 03/13/2017  . Vitamin D deficiency 01/06/2017  . Anxiety 01/01/2017  . Chronic atrial fibrillation (HCC)  06/15/2016  . Depression 06/15/2016  . Varicose veins of lower extremity with inflammation 09/07/2011    Prior to Admission medications   Medication Sig Start Date End Date Taking? Authorizing Provider  acetaminophen (TYLENOL) 500 MG tablet Take 2 tablets (1,000 mg total) by mouth every 8 (eight) hours as needed. 04/30/17  Yes Deepti Gunawan, Chrissie Noa, MD  albuterol (PROVENTIL HFA;VENTOLIN HFA) 108 (90 Base) MCG/ACT inhaler Inhale 2 puffs into the lungs every 6 (six) hours as needed for wheezing or shortness of breath. 04/12/17  Yes Shemaiah Round, Chrissie Noa, MD  amiodarone (PACERONE) 200 MG tablet Take 0.5 tablets (100 mg total) by mouth daily. 04/30/17  Yes Lawayne Hartig, Chrissie Noa, MD  aspirin 81 MG chewable tablet Chew 81 mg by mouth daily.   Yes [provider]  atorvastatin (LIPITOR) 40 MG tablet Take 1 tablet (40 mg total) by mouth daily. 04/26/17  Yes Hackney, Tina A, FNP  benzonatate (TESSALON) 100 MG capsule Take 100 mg by mouth every 6 (six) hours as needed. 03/25/17  Yes [provider]  budesonide-formoterol (SYMBICORT) 160-4.5 MCG/ACT inhaler Inhale 2 puffs into the lungs 2 (two) times daily.   Yes [provider]  carvedilol (COREG) 25 MG tablet Take 1 tablet (25 mg total) by mouth 2 (two) times daily. 04/07/17  Yes Onyx Schirmer, Chrissie Noa, MD  Cholecalciferol (VITAMIN D3) 1000 units CAPS Take 1 capsule (1,000 Units total) by mouth daily. 04/05/17  Yes Brodric Schauer, Chrissie Noa, MD  Cyanocobalamin (B-12) 1000 MCG TABS Take 1 tablet by mouth daily. 04/05/17  Yes Besan Ketchem, Chrissie Noa, MD  DULoxetine (CYMBALTA) 30 MG capsule  Take 1 capsule (30 mg total) by mouth daily. 04/30/17  Yes Sotiria Keast, Chrissie Noa, MD  fluticasone (FLONASE) 50 MCG/ACT nasal spray Place 2 sprays into both nostrils daily.   Yes [provider]  furosemide (LASIX) 80 MG tablet Take 1 tablet (80 mg total) by mouth 2 (two) times daily. 04/25/17  Yes Sienna Stonehocker, Chrissie Noa, MD  losartan (COZAAR) 25 MG tablet Take 1 tablet (25 mg total) by mouth 2 (two) times  daily. 04/07/17  Yes Diani Jillson, Chrissie Noa, MD  omeprazole (PRILOSEC) 20 MG capsule Take 40 mg by mouth daily.    Yes [provider]  OXYGEN Inhale 2 L into the lungs.   Yes [provider]  potassium chloride SA (K-DUR,KLOR-CON) 20 MEQ tablet Take 1 tablet (20 mEq total) by mouth daily. 03/24/17  Yes Clarisa Kindred A, FNP  promethazine (PHENERGAN) 25 MG tablet Take 1 tablet (25 mg total) by mouth every 6 (six) hours as needed. 03/31/17  Yes Margaurite Salido, Chrissie Noa, MD  gabapentin (NEURONTIN) 300 MG capsule Take 1 capsule (300 mg total) by mouth at bedtime. 04/30/17   Schuyler Amor, MD    Allergies  Allergen Reactions  . Sulfa Antibiotics Itching    Pt states she gets "itching inside and out"    Past Surgical History:  Procedure Laterality Date  . REPLACEMENT TOTAL KNEE    . TYMPANOSTOMY TUBE PLACEMENT Bilateral 03/19/2017    Social History  Substance Use Topics  . Smoking status: Former Games developer  . Smokeless tobacco: Never Used  . Alcohol use No    Family History  Problem Relation Age of Onset  . Family history unknown: Yes    Medication list has been reviewed and updated.  Physical Examination: BP 138/84   Pulse 77   Resp 16   Ht  (1.575 m)   Wt 161 lb (73 kg)   SpO2 96%   BMI 29.45 kg/m   Physical Exam  Constitutional: She appears well-developed and well-nourished.  Cardiovascular: Normal rate, regular rhythm and normal heart sounds.   Pulmonary/Chest: Effort normal and breath sounds normal.  Abdominal: Soft. She exhibits no distension. There is no tenderness.  Musculoskeletal: She exhibits no edema.  Neurological: She is alert.  Skin: Skin is warm and dry.  Psychiatric: She has a normal mood and affect. Her behavior is normal.  Nursing note and vitals reviewed.   Assessment and Plan:  1. Chronic systolic heart failure (HCC) Compensated on current regimen, cards following  2. Chronic obstructive pulmonary disease, unspecified COPD type (HCC) Stable on  home O2, finishing prednisone  3. Essential hypertension Well controlled  4. DDD (degenerative disc disease), lumbar Begin gabapentin 300 mg qhs, titrate up as tolerated  5. Gastroesophageal reflux disease, esophagitis presence not specified Stable on daily PPI, consider taper  6. Depression, unspecified depression type Decrease Cymbalta back to 30 mg daily given increased nausea  7. Nausea May improve on decreased Cymbalta/amiodarone  8. B12 deficiency On supplement  9. Vitamin D deficiency On supplement  10. Need for influenza vaccination - Flu vaccine HIGH DOSE PF (Fluzone High dose)  Return in about 4 weeks (around 05/28/2017).  Dionne Ano. Kingsley Spittle MD [facility]  04/30/2017

## 2017-04-30 NOTE — Patient Instructions (Signed)
Decrease Cymbalta to 30 mg daily. Decrease amiodarone to 100 mg daily. Stop melatonin. Begin gabapentin 300 mg daily at bedtime.

## 2017-05-03 DIAGNOSIS — I420 Dilated cardiomyopathy: Secondary | ICD-10-CM | POA: Diagnosis not present

## 2017-05-03 DIAGNOSIS — J441 Chronic obstructive pulmonary disease with (acute) exacerbation: Secondary | ICD-10-CM | POA: Diagnosis not present

## 2017-05-03 DIAGNOSIS — J209 Acute bronchitis, unspecified: Secondary | ICD-10-CM | POA: Diagnosis not present

## 2017-05-03 DIAGNOSIS — I2510 Atherosclerotic heart disease of native coronary artery without angina pectoris: Secondary | ICD-10-CM | POA: Diagnosis not present

## 2017-05-03 DIAGNOSIS — J9611 Chronic respiratory failure with hypoxia: Secondary | ICD-10-CM | POA: Diagnosis not present

## 2017-05-03 DIAGNOSIS — J440 Chronic obstructive pulmonary disease with acute lower respiratory infection: Secondary | ICD-10-CM | POA: Diagnosis not present

## 2017-05-05 ENCOUNTER — Other Ambulatory Visit: Payer: Self-pay | Admitting: Family Medicine

## 2017-05-05 ENCOUNTER — Telehealth: Payer: Self-pay

## 2017-05-05 DIAGNOSIS — H6983 Other specified disorders of Eustachian tube, bilateral: Secondary | ICD-10-CM | POA: Diagnosis not present

## 2017-05-05 DIAGNOSIS — I2510 Atherosclerotic heart disease of native coronary artery without angina pectoris: Secondary | ICD-10-CM | POA: Diagnosis not present

## 2017-05-05 DIAGNOSIS — J440 Chronic obstructive pulmonary disease with acute lower respiratory infection: Secondary | ICD-10-CM | POA: Diagnosis not present

## 2017-05-05 DIAGNOSIS — J9611 Chronic respiratory failure with hypoxia: Secondary | ICD-10-CM | POA: Diagnosis not present

## 2017-05-05 DIAGNOSIS — J441 Chronic obstructive pulmonary disease with (acute) exacerbation: Secondary | ICD-10-CM | POA: Diagnosis not present

## 2017-05-05 DIAGNOSIS — I420 Dilated cardiomyopathy: Secondary | ICD-10-CM | POA: Diagnosis not present

## 2017-05-05 DIAGNOSIS — J209 Acute bronchitis, unspecified: Secondary | ICD-10-CM | POA: Diagnosis not present

## 2017-05-05 MED ORDER — PROMETHAZINE HCL 25 MG PO TABS
25.0000 mg | ORAL_TABLET | Freq: Four times a day (QID) | ORAL | 2 refills | Status: DC | PRN
Start: 2017-05-05 — End: 2017-06-02

## 2017-05-05 NOTE — Telephone Encounter (Signed)
Done

## 2017-05-05 NOTE — Telephone Encounter (Signed)
[name] [name] is going away and her sister will be in to care for [name] [name] or [name] [name] will be going to sisters house. They request Promethazine be filled while she is away by sending refill to [facility]

## 2017-05-06 ENCOUNTER — Ambulatory Visit (INDEPENDENT_AMBULATORY_CARE_PROVIDER_SITE_OTHER): Payer: Medicare Other | Admitting: Nurse Practitioner

## 2017-05-06 ENCOUNTER — Encounter: Payer: Self-pay | Admitting: Nurse Practitioner

## 2017-05-06 VITALS — BP 98/52 | HR 70 | Ht 62.0 in | Wt 161.5 lb

## 2017-05-06 DIAGNOSIS — I340 Nonrheumatic mitral (valve) insufficiency: Secondary | ICD-10-CM | POA: Diagnosis not present

## 2017-05-06 DIAGNOSIS — J449 Chronic obstructive pulmonary disease, unspecified: Secondary | ICD-10-CM | POA: Diagnosis not present

## 2017-05-06 DIAGNOSIS — I5043 Acute on chronic combined systolic (congestive) and diastolic (congestive) heart failure: Secondary | ICD-10-CM

## 2017-05-06 DIAGNOSIS — I5022 Chronic systolic (congestive) heart failure: Secondary | ICD-10-CM | POA: Diagnosis not present

## 2017-05-06 DIAGNOSIS — I10 Essential (primary) hypertension: Secondary | ICD-10-CM

## 2017-05-06 DIAGNOSIS — I428 Other cardiomyopathies: Secondary | ICD-10-CM

## 2017-05-06 MED ORDER — FUROSEMIDE 80 MG PO TABS: 80.0000 mg | ORAL_TABLET | Freq: Two times a day (BID) | ORAL | 3 refills | Status: DC

## 2017-05-06 NOTE — Patient Instructions (Addendum)
Medication Instructions:  Your physician has recommended you make the following change in your medication:  1- TAKE an extra 40 mg furosemide by mouth once a day for 3 days along with your usual Furosemide 80 mg by mouth twice a day.   Labwork: Your physician recommends that you return for lab work in: TODAY (BMP, BNP).   Testing/Procedures: none  Follow-Up: Your physician recommends that you schedule a follow-up appointment in: 2 WEEKS WITH TINA HACKEY, NP.   If you need a refill on your cardiac medications before your next appointment, please call your pharmacy.

## 2017-05-06 NOTE — Progress Notes (Signed)
Office Visit    Patient Name: [name] [name] Date of Encounter: 05/06/2017  Primary Care Provider:  Schuyler Amor, MD Primary Cardiologist:  Concha Se, MD   Chief Complaint    71 old female with history of chronic combined systolic and diastolic heart failure, nonobstructive CAD, remote tobacco abuse, severe COPD, and severe mitral regurgitation, who presents for follow-up.  Past Medical History    Past Medical History:  Diagnosis Date  . Chronic combined systolic (congestive) and diastolic (congestive) heart failure (HCC)    a. 02/2017 Echo: EF 25-30%, Gr2 DD.  Marland Kitchen COPD (chronic obstructive pulmonary disease) (HCC)    a. On supplemental O2 @ home.  Marland Kitchen Hypertension   . Lung nodule   . Narrow complex tachycardia (HCC)    a. 02/2017 - ? NSVT vs Afib-->placed on amio.  No OAC.  Marland Kitchen NICM (nonischemic cardiomyopathy) (HCC)    a. 02/2017 Echo: Ef 25-30%, mild conc LVH, Gr2 DD, sev MR, mod dil LA;  b. 02/2016 Cath [facility]): nonobs dzs.  . Non-obstructive CAD    a. 02/2016 Cath [facility]): LM 30, LAD 20ost/p, 53m, 30d, D1 50p, D2 30, D3 20, RI 20, LCX 40p, OM1 30, OM2 20, RCA 20 diffuse, RPL2 30, RPL3 30.  Marland Kitchen Severe mitral regurgitation    a. 02/2017 Echo: Sev MR.   Past Surgical History:  Procedure Laterality Date  . REPLACEMENT TOTAL KNEE    . TYMPANOSTOMY TUBE PLACEMENT Bilateral 03/19/2017    Allergies  Allergies  Allergen Reactions  . Sulfa Antibiotics Itching    Pt states she gets "itching inside and out"    History of Present Illness    71 year old female with the above complex past medical history. Known prior history of tobacco abuse, quitting in May 2018 severe COPD requiring home O2. Also with a history of systolic heart failure and nonobstructive CAD/nonischemic cardiomyopathy with catheterization in August 2017 at [facility] (found in care everywhere). She also has severe mitral regurgitation. She was admitted to [facility] in August 2018 with sepsis, mild troponin elevation,  volume overload, and COPD exacerbation. Following diuresis and improvement rest or status, she was discharged. Conservative therapy recommended. She has since been followed up in heart failure clinic and had been doing well. Based on weights in the system, her weight has been steadily climbing. She left the hospital in 141 pounds and is now up to 161. She is chronic dyspnea and very limited activity. She does weigh herself at home. Her daughter is with her today today. They say that they try and watch her salt intake but then report that her weight is up 6 pounds overnight after eating 3 hot dogs yesterday. She has noticed increasing abdominal girth but no lower extremity edema. She denies PND, orthopnea, dizziness, syncope, chest pain, palpitations, or early satiety.  Home Medications    Prior to Admission medications   Medication Sig Start Date End Date Taking? Authorizing Provider  acetaminophen (TYLENOL) 500 MG tablet Take 2 tablets (1,000 mg total) by mouth every 8 (eight) hours as needed. 04/30/17  Yes Plonk, Chrissie Noa, MD  albuterol (PROVENTIL HFA;VENTOLIN HFA) 108 (90 Base) MCG/ACT inhaler Inhale 2 puffs into the lungs every 6 (six) hours as needed for wheezing or shortness of breath. 04/12/17  Yes Plonk, Chrissie Noa, MD  amiodarone (PACERONE) 100 MG tablet Take 100 mg by mouth daily.   Yes [provider]  aspirin 81 MG chewable tablet Chew 81 mg by mouth daily.   Yes [provider]  atorvastatin (LIPITOR) 40 MG tablet Take 1 tablet (40 mg total) by mouth daily. 04/26/17  Yes Hackney, Tina A, FNP  benzonatate (TESSALON) 100 MG capsule Take 100 mg by mouth every 6 (six) hours as needed. 03/25/17  Yes [provider]  budesonide-formoterol (SYMBICORT) 160-4.5 MCG/ACT inhaler Inhale 2 puffs into the lungs 2 (two) times daily.   Yes [provider]  carvedilol (COREG) 25 MG tablet Take 1 tablet (25 mg total) by mouth 2 (two) times daily. 04/07/17  Yes Plonk, Chrissie Noa, MD    Cholecalciferol (VITAMIN D3) 1000 units CAPS Take 1 capsule (1,000 Units total) by mouth daily. 04/05/17  Yes Plonk, Chrissie Noa, MD  Cyanocobalamin (B-12) 1000 MCG TABS Take 1 tablet by mouth daily. 04/05/17  Yes Plonk, Chrissie Noa, MD  DULoxetine (CYMBALTA) 30 MG capsule Take 1 capsule (30 mg total) by mouth daily. 04/30/17  Yes Plonk, Chrissie Noa, MD  fluticasone (FLONASE) 50 MCG/ACT nasal spray Place 2 sprays into both nostrils daily.   Yes [provider]  gabapentin (NEURONTIN) 300 MG capsule Take 1 capsule (300 mg total) by mouth at bedtime. 04/30/17  Yes Plonk, Chrissie Noa, MD  losartan (COZAAR) 25 MG tablet Take 1 tablet (25 mg total) by mouth 2 (two) times daily. 04/07/17  Yes Plonk, Chrissie Noa, MD  omeprazole (PRILOSEC) 20 MG capsule Take 40 mg by mouth daily.    Yes [provider]  OXYGEN Inhale 2 L into the lungs.   Yes [provider]  potassium chloride SA (K-DUR,KLOR-CON) 20 MEQ tablet Take 1 tablet (20 mEq total) by mouth daily. 03/24/17  Yes Clarisa Kindred A, FNP  promethazine (PHENERGAN) 25 MG tablet Take 1 tablet (25 mg total) by mouth every 6 (six) hours as needed for nausea. 05/05/17  Yes Plonk, Chrissie Noa, MD  furosemide (LASIX) 80 MG tablet Take 1 tablet (80 mg total) by mouth 2 (two) times daily. 05/06/17 08/04/17  Ok Anis, NP    Review of Systems    Increasing abdominal girth with 6 pound weight gain overnight by her home scale. She has chronic dyspnea. She has diffuse chronic aches and pains.  All other systems reviewed and are otherwise negative except as noted above.  Physical Exam    VS:  BP (!) 98/52 (BP Location: Right Arm, Patient Position: Sitting, Cuff Size: Normal)   Pulse 70   Ht 5\' 2"  (1.575 m)   Wt 161 lb 8 oz (73.3 kg)   BMI 29.54 kg/m  , BMI Body mass index is 29.54 kg/m. GEN: Well nourished, well developed, in no acute distress.  HEENT: normal.  Neck: Supple, JVD to jaw. No carotid bruits, or masses. Cardiac: RRR, soft systolic murmur  at the apex. No rubs, or gallops. No clubbing, cyanosis, edema.  Radials/DP/PT 2+ and equal bilaterally.  Respiratory:  Respirations regular and unlabored, clear to auscultation bilaterally. GI: semi-firm and protuberant, non-tender, BS + x 4. MS: no deformity or atrophy. Skin: warm and dry, no rash. Neuro:  Strength and sensation are intact. Psych: Normal affect.  Assessment & Plan    1.  Acute on chronic combined systolic and diastolic CHF/NICM:  EF 25-30% by echo in 02/2017.  Cath report from [facility] - 2017 - reviewed in Care Everywhere - mild to mod diffuse non-obs dzs.  PMH updated with cath data.  Pt has chronic dyspnea and hasn't noted any significant worsening, but does report 6 lbs wt gain overnight after eating three hot dogs for dinner last night.  Wt has been  steadily climbing since hospital d/c in April.  JVP is up and abd semi-firm.  She is currently on lasix 80 bid.  I will check bmet and bnp today.  I've asked her to increase lasix/take an additional 40 mg daily for the next three days.  Dtr with her today.  We discussed the importance of daily weights, sodium restriction, medication compliance, and symptom reporting and pt/dtr verbalize understanding, though neither seemed to be aware that a six lbs wt gain is an issue, or that hot dogs are loaded with salt.  Cont  blocker, ARB.  No BP room to titrate either, add MRA, or consider switching to entresto.  2.  Severe MR:  Noted on echo in August in the setting of severe LV dysfxn.  Poor surgical candidate in the setting of NICM and severe COPD.  Cont conservative Rx.  3.  Non-obs CAD:  No chest pain.  Cont asa, statin,  blocker.  4.  COPD:  On home O2.  5.  Dispo:  Volume up today.  Additional lasix x 3 days.  I'd like for her to f/u within the next two wks.  If we cannot get her in, she will f/u in CHF clinic.   Nicolasa Ducking, NP 05/06/2017, 4:54 PM

## 2017-05-07 LAB — BASIC METABOLIC PANEL
BUN/Creatinine Ratio: 19 (ref 12–28)
BUN: 17 mg/dL (ref 8–27)
CALCIUM: 8.9 mg/dL (ref 8.7–10.3)
CHLORIDE: 101 mmol/L (ref 96–106)
CO2: 27 mmol/L (ref 20–29)
Creatinine, Ser: 0.89 mg/dL (ref 0.57–1.00)
GFR, EST AFRICAN AMERICAN: 74 mL/min/{1.73_m2} (ref >59)
GFR, EST NON AFRICAN AMERICAN: 65 mL/min/{1.73_m2} (ref >59)
Glucose: 95 mg/dL (ref 65–99)
POTASSIUM: 4.3 mmol/L (ref 3.5–5.2)
SODIUM: 145 mmol/L — AB (ref 134–144)

## 2017-05-07 LAB — BRAIN NATRIURETIC PEPTIDE: BNP: 116.6 pg/mL — ABNORMAL HIGH (ref 0.0–100.0)

## 2017-05-10 ENCOUNTER — Other Ambulatory Visit: Payer: Self-pay

## 2017-05-10 DIAGNOSIS — R110 Nausea: Secondary | ICD-10-CM

## 2017-05-10 MED ORDER — ONDANSETRON 4 MG PO TBDP: 4.0000 mg | ORAL_TABLET | Freq: Once | ORAL | Status: DC

## 2017-05-10 MED ORDER — ONDANSETRON HCL 8 MG PO TABS: 8.0000 mg | ORAL_TABLET | Freq: Three times a day (TID) | ORAL | 0 refills | Status: DC | PRN

## 2017-05-24 ENCOUNTER — Ambulatory Visit: Payer: Medicare Other | Admitting: Family

## 2017-05-24 ENCOUNTER — Ambulatory Visit: Payer: Medicare Other | Attending: Family | Admitting: Family

## 2017-05-24 ENCOUNTER — Encounter: Payer: Self-pay | Admitting: Family

## 2017-05-24 VITALS — BP 118/55 | HR 65 | Ht 62.0 in | Wt 162.1 lb

## 2017-05-24 DIAGNOSIS — Z7982 Long term (current) use of aspirin: Secondary | ICD-10-CM | POA: Diagnosis not present

## 2017-05-24 DIAGNOSIS — Z87891 Personal history of nicotine dependence: Secondary | ICD-10-CM | POA: Insufficient documentation

## 2017-05-24 DIAGNOSIS — J449 Chronic obstructive pulmonary disease, unspecified: Secondary | ICD-10-CM | POA: Insufficient documentation

## 2017-05-24 DIAGNOSIS — Z79899 Other long term (current) drug therapy: Secondary | ICD-10-CM | POA: Diagnosis not present

## 2017-05-24 DIAGNOSIS — I5022 Chronic systolic (congestive) heart failure: Secondary | ICD-10-CM | POA: Diagnosis not present

## 2017-05-24 DIAGNOSIS — I10 Essential (primary) hypertension: Secondary | ICD-10-CM

## 2017-05-24 DIAGNOSIS — R0602 Shortness of breath: Secondary | ICD-10-CM | POA: Diagnosis present

## 2017-05-24 DIAGNOSIS — G8929 Other chronic pain: Secondary | ICD-10-CM | POA: Diagnosis not present

## 2017-05-24 DIAGNOSIS — I110 Hypertensive heart disease with heart failure: Secondary | ICD-10-CM | POA: Diagnosis not present

## 2017-05-24 NOTE — Progress Notes (Signed)
Patient ID: [name] [name], female    DOB: [birth year], 71 y.o.   MRN: [id number]  HPI  [name] [name] is a 71 y/o female with a history of HTN, COPD, chronic pain, previous tobacco use and chronic heart failure.   Echo report from 03/15/17 reviewed and showed an EF of 25-30% along with severe MR. This is unchanged from [facility] November 2017.  Admitted 03/13/17 due to sepsis and HF. Treated with antibiotics. Cardiology consult obtained. Initially needed IV diuretics and transitioned to oral diuretics with net loss of ~6.6 liters of fluid. Oxygen continued. Discharged home after 5 days.  She presents today for her follow-up visit with a chief complaint of moderate shortness of breath with minimal exertion. She describes this as chronic in nature having been present for several years with varying levels of severity. She has associated fatigue and occasional chest tightness along with this. She denies any edema or weight gain. Continues to have pain in her left ear.  Past Medical History:  Diagnosis Date  . CHF (congestive heart failure) (HCC)   . COPD (chronic obstructive pulmonary disease) (HCC)   . Hypertension   . Lung nodule    Past Surgical History:  Procedure Laterality Date  . REPLACEMENT TOTAL KNEE     No family history on file. Social History  Substance Use Topics  . Smoking status: Former Games developer  . Smokeless tobacco: Never Used  . Alcohol use No   Allergies  Allergen Reactions  . Sulfa Antibiotics Itching   Prior to Admission medications   Medication Sig Start Date End Date Taking? Authorizing Provider  acetaminophen (TYLENOL) 500 MG tablet Take 2 tablets (1,000 mg total) by mouth every 8 (eight) hours as needed. 04/30/17  Yes Plonk, Chrissie Noa, MD  albuterol (PROVENTIL HFA;VENTOLIN HFA) 108 (90 Base) MCG/ACT inhaler Inhale 2 puffs into the lungs every 6 (six) hours as needed for wheezing or shortness of breath. 04/12/17  Yes Plonk, Chrissie Noa, MD  amiodarone (PACERONE) 100 MG tablet Take  100 mg by mouth daily.   Yes [provider]  aspirin 81 MG chewable tablet Chew 81 mg by mouth daily.   Yes [provider]  atorvastatin (LIPITOR) 40 MG tablet Take 1 tablet (40 mg total) by mouth daily. 04/26/17  Yes Samyah Bilbo A, FNP  benzonatate (TESSALON) 100 MG capsule Take 100 mg by mouth every 6 (six) hours as needed. 03/25/17  Yes [provider]  budesonide-formoterol (SYMBICORT) 160-4.5 MCG/ACT inhaler Inhale 2 puffs into the lungs 2 (two) times daily.   Yes [provider]  carvedilol (COREG) 25 MG tablet Take 1 tablet (25 mg total) by mouth 2 (two) times daily. 04/07/17  Yes Plonk, Chrissie Noa, MD  Cholecalciferol (VITAMIN D3) 1000 units CAPS Take 1 capsule (1,000 Units total) by mouth daily. 04/05/17  Yes Plonk, Chrissie Noa, MD  Cyanocobalamin (B-12) 1000 MCG TABS Take 1 tablet by mouth daily. 04/05/17  Yes Plonk, Chrissie Noa, MD  DULoxetine (CYMBALTA) 30 MG capsule Take 1 capsule (30 mg total) by mouth daily. 04/30/17  Yes Plonk, Chrissie Noa, MD  fluticasone (FLONASE) 50 MCG/ACT nasal spray Place 2 sprays into both nostrils daily.   Yes [provider]  furosemide (LASIX) 80 MG tablet Take 1 tablet (80 mg total) by mouth 2 (two) times daily. 05/06/17 08/04/17 Yes Ok Anis, NP  gabapentin (NEURONTIN) 300 MG capsule Take 1 capsule (300 mg total) by mouth at bedtime. 04/30/17  Yes Plonk, Chrissie Noa, MD  losartan (COZAAR) 25 MG tablet Take 1  tablet (25 mg total) by mouth 2 (two) times daily. 04/07/17  Yes Plonk, Chrissie NoaWilliam, MD  omeprazole (PRILOSEC) 20 MG capsule Take 40 mg by mouth daily.    Yes [provider]  ondansetron (ZOFRAN) 8 MG tablet Take 1 tablet (8 mg total) by mouth every 8 (eight) hours as needed for nausea or vomiting. 05/10/17  Yes Plonk, William, MD  OXYGEN Inhale 2 L into the lungs.   Yes [provider]  potassium chloride SA (K-DUR,KLOR-CON) 20 MEQ tablet Take 1 tablet (20 mEq total) by mouth daily. 03/24/17  Yes Clarisa KindredHackney,  Megann Easterwood A, FNP  promethazine (PHENERGAN) 25 MG tablet Take 1 tablet (25 mg total) by mouth every 6 (six) hours as needed for nausea. 05/05/17  Yes Plonk, Chrissie NoaWilliam, MD    Review of Systems  Constitutional: Positive for fatigue. Negative for appetite change.  HENT: Positive for ear pain and hearing loss. Negative for congestion, postnasal drip and sore throat.   Eyes: Negative.   Respiratory: Positive for chest tightness and shortness of breath. Negative for cough and wheezing.   Cardiovascular:  Negative for chest pain, palpitations and leg swelling.  Gastrointestinal: Negative for abdominal distention and pain Endocrine: Negative.   Genitourinary: Negative.   Musculoskeletal: Positive for back pain. Negative for neck pain.  Skin: Negative.   Allergic/Immunologic: Negative.   Neurological: Positive for weakness, light-headedness. Negative for dizziness, headaches.  Hematological: Negative for adenopathy. Bruises/bleeds easily.  Psychiatric/Behavioral: Positive for sleep disturbance (not sleeping well; unsure of why). Negative for dysphoric mood. The patient is not nervous/anxious.    Vitals:   05/24/17 1404  BP: (!) 118/55  Pulse: 65  SpO2: 100%  Weight: 162 lb 2 oz (73.5 kg)  Height: 5\' 2"  (1.575 m)   Wt Readings from Last 3 Encounters:  05/24/17 162 lb 2 oz (73.5 kg)  05/06/17 161 lb 8 oz (73.3 kg)  04/30/17 161 lb (73 kg)    Lab Results  Component Value Date   CREATININE 0.73 03/18/2017   CREATININE 0.84 03/17/2017   CREATININE 0.78 03/14/2017   Physical Exam  Constitutional: She is oriented to person, place, and time. She appears well-developed and well-nourished.  HENT:  Head: Normocephalic and atraumatic.  Right Ear: Decreased hearing is noted.  Left Ear: Decreased hearing is noted.  Neck: Normal range of motion. Neck supple. No JVD present.  Cardiovascular: Normal rate and regular rhythm.   Pulmonary/Chest: Effort normal and breath sounds normal.  Abdominal: Soft.  She exhibits no distension. There is no tenderness.  Musculoskeletal: She exhibits no edema or tenderness.  Neurological: She is alert and oriented to person, place, and time.  Skin: Skin is warm and dry.  Psychiatric: She has a normal mood and affect. Her behavior is normal. Thought content normal.  Nursing note and vitals reviewed.   Assessment & Plan:  1: Chronic heart failure with reduced ejection fraction- - NYHA class III - euvolemic today - weighing daily and she was reminded to call for an overnight weight gain of >2 pounds or a weekly weight gain of >5 pounds - by our scale, she's gained 6 pounds since she was last here - adds salt "sometimes to eggs". Reminded to closely follow a 2000mg  sodium diet - could consider changing her losartan to entresto in the future; patient not interested today - saw cardiology Brion Aliment([place]) 05/06/17 - patient reports receiving the flu vaccine for this season  2: HTN- - BP looks good today - saw PCP (Plonk) on 04/30/17 and returns  early November - BMP done 05/06/17 reviewed and shows sodium 145, potassium 4.3 and GFR 65  3: COPD- - wears oxygen at 2-3L around the clock  Medication list was reviewed.  Return here in 3 months or sooner for any questions/problems before then.

## 2017-05-24 NOTE — Patient Instructions (Signed)
Continue weighing daily and call for an overnight weight gain of > 2 pounds or a weekly weight gain of >5 pounds. 

## 2017-05-26 DIAGNOSIS — F17200 Nicotine dependence, unspecified, uncomplicated: Secondary | ICD-10-CM | POA: Diagnosis not present

## 2017-05-26 DIAGNOSIS — Z515 Encounter for palliative care: Secondary | ICD-10-CM | POA: Diagnosis not present

## 2017-05-26 DIAGNOSIS — J449 Chronic obstructive pulmonary disease, unspecified: Secondary | ICD-10-CM | POA: Diagnosis not present

## 2017-05-26 DIAGNOSIS — I5022 Chronic systolic (congestive) heart failure: Secondary | ICD-10-CM | POA: Diagnosis not present

## 2017-06-02 ENCOUNTER — Encounter: Payer: Self-pay | Admitting: Family Medicine

## 2017-06-02 ENCOUNTER — Other Ambulatory Visit: Payer: Self-pay

## 2017-06-02 ENCOUNTER — Ambulatory Visit (INDEPENDENT_AMBULATORY_CARE_PROVIDER_SITE_OTHER): Payer: Medicare Other | Admitting: Family Medicine

## 2017-06-02 VITALS — BP 113/76 | HR 65 | Resp 16 | Ht 62.0 in | Wt 162.0 lb

## 2017-06-02 DIAGNOSIS — Z8679 Personal history of other diseases of the circulatory system: Secondary | ICD-10-CM

## 2017-06-02 DIAGNOSIS — I5022 Chronic systolic (congestive) heart failure: Secondary | ICD-10-CM

## 2017-06-02 DIAGNOSIS — K219 Gastro-esophageal reflux disease without esophagitis: Secondary | ICD-10-CM

## 2017-06-02 DIAGNOSIS — R110 Nausea: Secondary | ICD-10-CM

## 2017-06-02 DIAGNOSIS — Z23 Encounter for immunization: Secondary | ICD-10-CM | POA: Diagnosis not present

## 2017-06-02 DIAGNOSIS — I10 Essential (primary) hypertension: Secondary | ICD-10-CM

## 2017-06-02 DIAGNOSIS — M5136 Other intervertebral disc degeneration, lumbar region: Secondary | ICD-10-CM | POA: Diagnosis not present

## 2017-06-02 DIAGNOSIS — J449 Chronic obstructive pulmonary disease, unspecified: Secondary | ICD-10-CM | POA: Diagnosis not present

## 2017-06-02 MED ORDER — GABAPENTIN 300 MG PO CAPS: 300.0000 mg | ORAL_CAPSULE | Freq: Two times a day (BID) | ORAL | 2 refills | Status: DC

## 2017-06-02 MED ORDER — TETANUS-DIPHTH-ACELL PERTUSSIS 5-2.5-18.5 LF-MCG/0.5 IM SUSP: 0.5000 mL | Freq: Once | INTRAMUSCULAR | 0 refills | Status: AC

## 2017-06-02 MED ORDER — PROMETHAZINE HCL 25 MG PO TABS: 25.0000 mg | ORAL_TABLET | Freq: Four times a day (QID) | ORAL | 2 refills | Status: DC | PRN

## 2017-06-02 MED ORDER — FUROSEMIDE 80 MG PO TABS: ORAL_TABLET | ORAL | 3 refills | Status: DC

## 2017-06-02 NOTE — Progress Notes (Signed)
Date:  06/02/2017   Name:  [name] MussBetsy Ellis [name]   DOB:  08/21/1945   MRN:  [id number]  PCP:  [name] AmorPlonk, [name] Eddinger, MD    Chief Complaint: No chief complaint on file.   History of Present Illness:  This is a 71 y.o. female seen for one month f/u. Saw cards and palliative med since last visit, no med changes made. Now taking Lasix 80 mg qam and 120 mg qpm with stable weights. C/o persistent B hip and LBP, gabapentin helps, wants to increase. C/o persistent nausea since starting Cymbalta, better with decreased dose but still a problem. Using O2 3 l/min almost continuously now. Due for Pneumovax, tetanus status unknown.  Review of Systems:  Review of Systems  Constitutional: Negative for chills and fever.  Respiratory: Negative for cough.   Cardiovascular: Negative for chest pain and leg swelling.  Genitourinary: Negative for difficulty urinating.  Neurological: Negative for syncope and light-headedness.    Patient Active Problem List   Diagnosis Date Noted  . DDD (degenerative disc disease), lumbar 04/30/2017  . B12 deficiency 04/30/2017  . Gait instability 03/31/2017  . Allergic rhinitis 03/31/2017  . Nausea 03/31/2017  . GERD (gastroesophageal reflux disease) 03/31/2017  . Chronic systolic heart failure (HCC) 03/24/2017  . HTN (hypertension) 03/24/2017  . COPD (chronic obstructive pulmonary disease) (HCC) 03/24/2017  . Chronic pain 03/24/2017  . MRSA carrier 03/18/2017  . History of ventricular tachycardia 03/18/2017  . Vitamin D deficiency 01/06/2017  . Chronic atrial fibrillation (HCC) 06/15/2016  . Depression 06/15/2016  . Varicose veins of lower extremity with inflammation 09/07/2011    Prior to Admission medications   Medication Sig Start Date End Date Taking? Authorizing Provider  acetaminophen (TYLENOL) 500 MG tablet Take 2 tablets (1,000 mg total) by mouth every 8 (eight) hours as needed. 04/30/17  Yes Cammeron [name], [name] NoaWilliam, MD  [name] (PROVENTIL HFA;VENTOLIN HFA) 108 (90 Base)  MCG/ACT inhaler Inhale 2 puffs into the lungs every 6 (six) hours as needed for wheezing or shortness of breath. 04/12/17  Yes Carnita [name], [name] NoaWilliam, MD  amiodarone (PACERONE) 100 MG tablet Take 100 mg by mouth daily.   Yes [provider]  aspirin 81 MG chewable tablet Chew 81 mg by mouth daily.   Yes [provider]  atorvastatin (LIPITOR) 40 MG tablet Take 1 tablet (40 mg total) by mouth daily. 04/26/17  Yes [name], [name] [name] A, FNP  budesonide-formoterol (SYMBICORT) 160-4.5 MCG/ACT inhaler Inhale 2 puffs into the lungs 2 (two) times daily.   Yes [provider]  carvedilol (COREG) 25 MG tablet Take 1 tablet (25 mg total) by mouth 2 (two) times daily. 04/07/17  Yes Aaylah Pokorny, [name] NoaWilliam, MD  Cholecalciferol (VITAMIN D3) 1000 units CAPS Take 1 capsule (1,000 Units total) by mouth daily. 04/05/17  Yes Joquan [name], [name] NoaWilliam, MD  Cyanocobalamin (B-12) 1000 MCG TABS Take 1 tablet by mouth daily. 04/05/17  Yes [name] [name], [name] NoaWilliam, MD  fluticasone (FLONASE) 50 MCG/ACT nasal spray Place 2 sprays into both nostrils daily.   Yes [provider]  furosemide (LASIX) 80 MG tablet 1 tablet each morning and 1 1/2 tablets each afternoon 06/02/17  Yes Jimesha [name], [name] NoaWilliam, MD  gabapentin (NEURONTIN) 300 MG capsule Take 1 capsule (300 mg total) 2 (two) times daily by mouth. 06/02/17  Yes Leomia [name], [name] NoaWilliam, MD  losartan (COZAAR) 25 MG tablet Take 1 tablet (25 mg total) by mouth 2 (two) times daily. 04/07/17  Yes Markayla [name], [name] NoaWilliam, MD  omeprazole (PRILOSEC) 20 MG capsule Take 40 mg by mouth.    Yes  [provider]  ondansetron (ZOFRAN) 8 MG tablet Take 1 tablet (8 mg total) by mouth every 8 (eight) hours as needed for nausea or vomiting. 05/10/17  Yes [name] Kynard, MD  OXYGEN Inhale 2 L into the lungs.   Yes [provider]  potassium chloride SA (K-DUR,KLOR-CON) 20 MEQ tablet Take 1 tablet (20 mEq total) by mouth daily. 03/24/17  Yes Clarisa Kindred A, FNP  promethazine (PHENERGAN) 25 MG tablet Take 1 tablet (25  mg total) by mouth every 6 (six) hours as needed for nausea. 05/05/17  Yes [name] [name], [name] Noa, MD  Tdap Leda Min) 5-2.5-18.5 LF-MCG/0.5 injection Inject 0.5 mLs once for 1 dose into the muscle. 06/02/17 06/02/17  [name] Amor, MD    Allergies  Allergen Reactions  . Sulfa Antibiotics Itching    Pt states she gets "itching inside and out"    Past Surgical History:  Procedure Laterality Date  . REPLACEMENT TOTAL KNEE    . TYMPANOSTOMY TUBE PLACEMENT Bilateral 03/19/2017    Social History   Tobacco Use  . Smoking status: Former Games developer  . Smokeless tobacco: Never Used  Substance Use Topics  . Alcohol use: No  . Drug use: No    Family History  Family history unknown: Yes    Medication list has been reviewed and updated.  Physical Examination: BP 113/76   Pulse 65   Resp 16   Ht 5\' 2"  (1.575 m)   Wt 162 lb (73.5 kg) Comment: wheelchair  SpO2 100% Comment: 2 liter oxygen  BMI 29.63 kg/m   Physical Exam  Constitutional: She appears well-developed and well-nourished.  Cardiovascular: Normal rate, regular rhythm and normal heart sounds.  Pulmonary/Chest: Effort normal and breath sounds normal.  Musculoskeletal: She exhibits no edema.  Neurological: She is alert.  Skin: Skin is warm and dry.  Psychiatric: She has a normal mood and affect. Her behavior is normal.  Nursing note and vitals reviewed.   Assessment and Plan:  1. Chronic systolic heart failure (HCC) Stable on current regimen, cards following  2. Chronic obstructive pulmonary disease, unspecified COPD type (HCC) Cont inhalers, O2  3. Essential hypertension Well controlled on current regimen  4. Gastroesophageal reflux disease, esophagitis presence not specified On chronic PPI, reports sxs flare when tapers  5. DDD (degenerative disc disease), lumbar With chronic pain, increase gabapentin to bid then tid as tolerated  6. Nausea Temporally related to Cymbalta start, taper off over next 1-2 weeks  7.  History of ventricular tachycardia On amiodarone since 02/2017  8. Need for pneumococcal vaccination - Pneumococcal polysaccharide vaccine 23-valent greater than or equal to 2yo subcutaneous/IM  9. Need for diphtheria-tetanus-pertussis (Tdap) vaccine - Tdap (BOOSTRIX) 5-2.5-18.5 LF-MCG/0.5 injection; Inject 0.5 mLs once for 1 dose into the muscle.  Dispense: 0.5 mL; Refill: 0  Return in about 3 months (around 09/02/2017).  Dionne Ano. Kingsley Spittle MD [facility]  06/02/2017

## 2017-06-02 NOTE — Patient Instructions (Signed)
Taper off Cymbalta (take one tablet every other day for one week then stop). Increase gabapentin to twice daily, may increase to three times daily if ineffective.

## 2017-06-03 NOTE — Telephone Encounter (Signed)
MyChart message

## 2017-06-07 ENCOUNTER — Other Ambulatory Visit: Payer: Self-pay | Admitting: Family Medicine

## 2017-06-14 ENCOUNTER — Encounter: Payer: Self-pay | Admitting: Family Medicine

## 2017-06-14 NOTE — Telephone Encounter (Signed)
Patient mychart message.

## 2017-06-19 ENCOUNTER — Encounter: Payer: Self-pay | Admitting: Family Medicine

## 2017-06-21 ENCOUNTER — Other Ambulatory Visit: Payer: Self-pay | Admitting: Family Medicine

## 2017-06-21 MED ORDER — OMEPRAZOLE 20 MG PO CPDR: 20.0000 mg | DELAYED_RELEASE_CAPSULE | Freq: Every day | ORAL | 2 refills | Status: DC

## 2017-06-21 MED ORDER — GABAPENTIN 300 MG PO CAPS: 300.0000 mg | ORAL_CAPSULE | Freq: Three times a day (TID) | ORAL | 2 refills | Status: DC

## 2017-06-21 NOTE — Telephone Encounter (Signed)
Patient mychart message.

## 2017-06-28 ENCOUNTER — Encounter: Payer: Self-pay | Admitting: Family Medicine

## 2017-06-28 ENCOUNTER — Other Ambulatory Visit: Payer: Self-pay | Admitting: Family Medicine

## 2017-06-28 DIAGNOSIS — L400 Psoriasis vulgaris: Secondary | ICD-10-CM | POA: Diagnosis not present

## 2017-06-28 MED ORDER — OMEPRAZOLE 20 MG PO CPDR: 20.0000 mg | DELAYED_RELEASE_CAPSULE | Freq: Two times a day (BID) | ORAL | 2 refills | Status: DC

## 2017-06-29 ENCOUNTER — Encounter: Payer: Self-pay | Admitting: Family Medicine

## 2017-06-30 ENCOUNTER — Encounter: Payer: Self-pay | Admitting: Family Medicine

## 2017-06-30 ENCOUNTER — Ambulatory Visit (INDEPENDENT_AMBULATORY_CARE_PROVIDER_SITE_OTHER): Payer: Medicare Other | Admitting: Family Medicine

## 2017-06-30 VITALS — BP 116/58 | HR 81 | Ht 62.0 in | Wt 162.0 lb

## 2017-06-30 DIAGNOSIS — E559 Vitamin D deficiency, unspecified: Secondary | ICD-10-CM

## 2017-06-30 DIAGNOSIS — K219 Gastro-esophageal reflux disease without esophagitis: Secondary | ICD-10-CM | POA: Diagnosis not present

## 2017-06-30 DIAGNOSIS — E538 Deficiency of other specified B group vitamins: Secondary | ICD-10-CM | POA: Diagnosis not present

## 2017-06-30 DIAGNOSIS — J441 Chronic obstructive pulmonary disease with (acute) exacerbation: Secondary | ICD-10-CM

## 2017-06-30 DIAGNOSIS — J309 Allergic rhinitis, unspecified: Secondary | ICD-10-CM | POA: Diagnosis not present

## 2017-06-30 DIAGNOSIS — I482 Chronic atrial fibrillation, unspecified: Secondary | ICD-10-CM

## 2017-06-30 DIAGNOSIS — I5022 Chronic systolic (congestive) heart failure: Secondary | ICD-10-CM | POA: Diagnosis not present

## 2017-06-30 DIAGNOSIS — E785 Hyperlipidemia, unspecified: Secondary | ICD-10-CM

## 2017-06-30 DIAGNOSIS — I10 Essential (primary) hypertension: Secondary | ICD-10-CM

## 2017-06-30 DIAGNOSIS — M51369 Other intervertebral disc degeneration, lumbar region without mention of lumbar back pain or lower extremity pain: Secondary | ICD-10-CM

## 2017-06-30 DIAGNOSIS — M5136 Other intervertebral disc degeneration, lumbar region: Secondary | ICD-10-CM

## 2017-06-30 MED ORDER — OMEPRAZOLE 40 MG PO CPDR: 40.0000 mg | DELAYED_RELEASE_CAPSULE | Freq: Every day | ORAL | 3 refills | Status: DC

## 2017-06-30 MED ORDER — GABAPENTIN 300 MG PO CAPS: 600.0000 mg | ORAL_CAPSULE | Freq: Two times a day (BID) | ORAL | 2 refills | Status: DC

## 2017-06-30 MED ORDER — DOXYCYCLINE HYCLATE 100 MG PO CAPS: 100.0000 mg | ORAL_CAPSULE | Freq: Two times a day (BID) | ORAL | 0 refills | Status: DC

## 2017-06-30 MED ORDER — PROMETHAZINE HCL 25 MG PO TABS: 25.0000 mg | ORAL_TABLET | Freq: Four times a day (QID) | ORAL | 2 refills | Status: DC | PRN

## 2017-06-30 MED ORDER — FUROSEMIDE 80 MG PO TABS: ORAL_TABLET | ORAL | 3 refills | Status: DC

## 2017-06-30 NOTE — Telephone Encounter (Signed)
Mychart message from patient.

## 2017-07-01 DIAGNOSIS — E785 Hyperlipidemia, unspecified: Secondary | ICD-10-CM | POA: Insufficient documentation

## 2017-07-01 NOTE — Telephone Encounter (Signed)
Mychart message from patient.

## 2017-07-01 NOTE — Progress Notes (Signed)
Date:  06/30/2017   Name:  [name] [name]   DOB:  Apr 14, 1946   MRN:  [id number]  PCP:  Schuyler Amor, MD    Chief Complaint: Cough (Been going on for over a week- wheezing, chest discomfort. Taking in more oxygen now- would like more oxygen ordered. No production when coughing. When comes up (sometimes) only shows light green. )   History of Present Illness:  This is a 71 y.o. female seen for one month f/u. C/o 1.5 wks of increased SOB, rhinorrhea, cough prod brown phlegm, possible subjective fever at night. Taking gabapentin tid with some improvement in back/leg pain. Off Cymbalta, nausea better but not resolved.   Review of Systems:  Review of Systems  Constitutional: Negative for chills.  HENT: Negative for ear pain, sinus pain and sore throat.   Cardiovascular: Negative for chest pain and leg swelling.  Genitourinary: Negative for difficulty urinating.  Neurological: Negative for syncope and light-headedness.    Patient Active Problem List   Diagnosis Date Noted  . Hyperlipidemia 07/01/2017  . DDD (degenerative disc disease), lumbar 04/30/2017  . B12 deficiency 04/30/2017  . Gait instability 03/31/2017  . Allergic rhinitis 03/31/2017  . Nausea 03/31/2017  . GERD (gastroesophageal reflux disease) 03/31/2017  . Chronic systolic heart failure (HCC) 03/24/2017  . HTN (hypertension) 03/24/2017  . COPD (chronic obstructive pulmonary disease) (HCC) 03/24/2017  . Chronic pain 03/24/2017  . MRSA carrier 03/18/2017  . History of ventricular tachycardia 03/18/2017  . Vitamin D deficiency 01/06/2017  . Chronic atrial fibrillation (HCC) 06/15/2016  . Depression 06/15/2016  . Varicose veins of lower extremity with inflammation 09/07/2011    Prior to Admission medications   Medication Sig Start Date End Date Taking? Authorizing Provider  albuterol (PROVENTIL HFA;VENTOLIN HFA) 108 (90 Base) MCG/ACT inhaler Inhale 2 puffs into the lungs every 6 (six) hours as needed for wheezing or  shortness of breath. 04/12/17  Yes Alveta Quintela, Chrissie Noa, MD  amiodarone (PACERONE) 100 MG tablet Take 100 mg by mouth daily.   Yes [provider]  aspirin 81 MG chewable tablet Chew 81 mg by mouth daily.   Yes [provider]  atorvastatin (LIPITOR) 40 MG tablet Take 1 tablet (40 mg total) by mouth daily. 04/26/17  Yes Hackney, Inetta Fermo A, FNP  budesonide-formoterol (SYMBICORT) 160-4.5 MCG/ACT inhaler Inhale 2 puffs into the lungs 2 (two) times daily.   Yes [provider]  carvedilol (COREG) 25 MG tablet Take 1 tablet (25 mg total) by mouth 2 (two) times daily. 04/07/17  Yes Labrandon Knoch, Chrissie Noa, MD  Cholecalciferol (VITAMIN D3) 1000 units CAPS Take 1 capsule (1,000 Units total) by mouth daily. 04/05/17  Yes Aydin Hink, Chrissie Noa, MD  Cyanocobalamin (B-12) 1000 MCG TABS Take 1 tablet by mouth daily. 04/05/17  Yes Virgen Belland, Chrissie Noa, MD  fluticasone (FLONASE) 50 MCG/ACT nasal spray Place 2 sprays into both nostrils daily.   Yes [provider]  furosemide (LASIX) 80 MG tablet 1 tablet each morning and 1 1/2 tablets each afternoon 06/30/17  Yes Shanisha Lech, Chrissie Noa, MD  gabapentin (NEURONTIN) 300 MG capsule Take 2 capsules (600 mg total) by mouth 2 (two) times daily. 06/30/17  Yes Kyandre Okray, Chrissie Noa, MD  losartan (COZAAR) 25 MG tablet Take 1 tablet (25 mg total) by mouth 2 (two) times daily. 04/07/17  Yes Kamarion Zagami, Chrissie Noa, MD  omeprazole (PRILOSEC) 40 MG capsule Take 1 capsule (40 mg total) by mouth daily. 06/30/17  Yes Hattie Pine, Chrissie Noa, MD  OXYGEN Inhale 2 L into the lungs.   Yes [provider]  potassium chloride SA (K-DUR,KLOR-CON) 20 MEQ tablet Take 1 tablet (20 mEq total) by mouth daily. 03/24/17  Yes Clarisa KindredHackney, Tina A, FNP  promethazine (PHENERGAN) 25 MG tablet Take 1 tablet (25 mg total) by mouth every 6 (six) hours as needed for nausea or vomiting. 06/30/17  Yes Anabel Lykins, Chrissie NoaWilliam, MD  doxycycline (VIBRAMYCIN) 100 MG capsule Take 1 capsule (100 mg total) by mouth 2 (two) times daily. 06/30/17   Schuyler AmorPlonk,  Makiah Foye, MD    Allergies  Allergen Reactions  . Sulfa Antibiotics Itching    Pt states she gets "itching inside and out"    Past Surgical History:  Procedure Laterality Date  . REPLACEMENT TOTAL KNEE    . TYMPANOSTOMY TUBE PLACEMENT Bilateral 03/19/2017    Social History   Tobacco Use  . Smoking status: Former Games developermoker  . Smokeless tobacco: Never Used  Substance Use Topics  . Alcohol use: No  . Drug use: No    Family History  Family history unknown: Yes    Medication list has been reviewed and updated.  Physical Examination: BP (!) 116/58   Pulse 81   Ht 5\' 2"  (1.575 m)   Wt 162 lb (73.5 kg)   SpO2 100%   BMI 29.63 kg/m   Physical Exam  Constitutional: She appears well-developed and well-nourished.  Cardiovascular: Normal rate, regular rhythm and normal heart sounds.  Pulmonary/Chest: Effort normal and breath sounds normal.  Musculoskeletal: She exhibits no edema.  Neurological: She is alert.  Skin: Skin is warm and dry.  Psychiatric: She has a normal mood and affect. Her behavior is normal.  Nursing note and vitals reviewed.   Assessment and Plan:  1. Chronic obstructive pulmonary disease with acute exacerbation (HCC) Doxy x 7d, cont Symbicort, prn albuterol, call if sxs worsen/persist  2. Chronic systolic heart failure (HCC) Cont losartan/Lasix/Coreg/asa/statin, cards following  3. Chronic atrial fibrillation (HCC) On amiodarone/asa, cards following  4. Essential hypertension Well controlled on current regimen  5. Gastroesophageal reflux disease, esophagitis presence not specified Change Prilosec to 40 mg daily per pt request, intolerant taper  6. Hyperlipidemia, unspecified hyperlipidemia type Well controlled n Lipitor  7. Allergic rhinitis, unspecified seasonality, unspecified trigger Stable on Flonase  8. DDD (degenerative disc disease), lumbar Increase gabapentin to 600 mg bid  9. Vitamin D deficiency On increased supplement  10. B12  deficiency Well controlled on supplement  Return in about 2 months (around 08/31/2017).  Dionne AnoWilliam M. Kingsley SpittlePlonk, Jr. MD [facility]  07/01/2017

## 2017-07-02 ENCOUNTER — Other Ambulatory Visit: Payer: Self-pay | Admitting: Family Medicine

## 2017-07-06 ENCOUNTER — Other Ambulatory Visit: Payer: Self-pay | Admitting: Family Medicine

## 2017-07-06 ENCOUNTER — Encounter: Payer: Self-pay | Admitting: Family Medicine

## 2017-07-06 MED ORDER — ONDANSETRON 4 MG PO TBDP: 4.0000 mg | ORAL_TABLET | Freq: Three times a day (TID) | ORAL | 0 refills | Status: DC | PRN

## 2017-07-06 NOTE — Telephone Encounter (Signed)
Patient mychart message.

## 2017-07-07 ENCOUNTER — Other Ambulatory Visit: Payer: Self-pay | Admitting: Family Medicine

## 2017-07-07 ENCOUNTER — Encounter: Payer: Self-pay | Admitting: Family Medicine

## 2017-07-07 ENCOUNTER — Ambulatory Visit (INDEPENDENT_AMBULATORY_CARE_PROVIDER_SITE_OTHER): Payer: Medicare Other | Admitting: Family Medicine

## 2017-07-07 VITALS — BP 117/72 | HR 74 | Resp 15 | Ht 62.0 in | Wt 179.8 lb

## 2017-07-07 DIAGNOSIS — J449 Chronic obstructive pulmonary disease, unspecified: Secondary | ICD-10-CM | POA: Diagnosis not present

## 2017-07-07 DIAGNOSIS — I5022 Chronic systolic (congestive) heart failure: Secondary | ICD-10-CM | POA: Diagnosis not present

## 2017-07-07 DIAGNOSIS — J189 Pneumonia, unspecified organism: Secondary | ICD-10-CM

## 2017-07-07 DIAGNOSIS — K219 Gastro-esophageal reflux disease without esophagitis: Secondary | ICD-10-CM

## 2017-07-07 DIAGNOSIS — I482 Chronic atrial fibrillation, unspecified: Secondary | ICD-10-CM

## 2017-07-07 DIAGNOSIS — R110 Nausea: Secondary | ICD-10-CM

## 2017-07-07 DIAGNOSIS — J181 Lobar pneumonia, unspecified organism: Secondary | ICD-10-CM

## 2017-07-07 DIAGNOSIS — I10 Essential (primary) hypertension: Secondary | ICD-10-CM

## 2017-07-07 MED ORDER — FUROSEMIDE 80 MG PO TABS: ORAL_TABLET | ORAL | 3 refills | Status: DC

## 2017-07-07 MED ORDER — LEVOFLOXACIN 500 MG PO TABS: 500.0000 mg | ORAL_TABLET | Freq: Every day | ORAL | 0 refills | Status: DC

## 2017-07-07 MED ORDER — GABAPENTIN 600 MG PO TABS: 600.0000 mg | ORAL_TABLET | Freq: Two times a day (BID) | ORAL | 3 refills | Status: DC

## 2017-07-07 MED ORDER — BUDESONIDE-FORMOTEROL FUMARATE 160-4.5 MCG/ACT IN AERO: 2.0000 | INHALATION_SPRAY | Freq: Two times a day (BID) | RESPIRATORY_TRACT | 2 refills | Status: DC

## 2017-07-07 NOTE — Patient Instructions (Signed)
Trial OTC Zyrtec daily for runny nose (generic ok).

## 2017-07-07 NOTE — Telephone Encounter (Signed)
Patient mychart message.

## 2017-07-08 NOTE — Progress Notes (Signed)
Date:  07/07/2017   Name:  [name] MussBetsy Ellis Caprara   DOB:  06/10/1946   MRN:  [id number]  PCP:  Schuyler AmorPlonk, Marytza Grandpre, MD    Chief Complaint: Cough (has finished Abx and no better.)   History of Present Illness:  This is a 71 y.o. female seen in one week f/u from urgent visit for COPD exacerbation, placed on doxy x 7d, finished course today, cough worse, aching all over, rhinorrhea severe. Needs refill Symbicort. BLE edema/weight about the same.  Review of Systems:  Review of Systems  Constitutional: Negative for chills and fever.  HENT: Negative for sinus pain and sore throat.   Gastrointestinal: Negative for abdominal pain.  Genitourinary: Negative for difficulty urinating.  Neurological: Negative for syncope and light-headedness.    Patient Active Problem List   Diagnosis Date Noted  . Hyperlipidemia 07/01/2017  . DDD (degenerative disc disease), lumbar 04/30/2017  . B12 deficiency 04/30/2017  . Gait instability 03/31/2017  . Allergic rhinitis 03/31/2017  . Nausea 03/31/2017  . GERD (gastroesophageal reflux disease) 03/31/2017  . Chronic systolic heart failure (HCC) 03/24/2017  . HTN (hypertension) 03/24/2017  . COPD (chronic obstructive pulmonary disease) (HCC) 03/24/2017  . Chronic pain 03/24/2017  . MRSA carrier 03/18/2017  . History of ventricular tachycardia 03/18/2017  . Vitamin D deficiency 01/06/2017  . Chronic atrial fibrillation (HCC) 06/15/2016  . Depression 06/15/2016  . Varicose veins of lower extremity with inflammation 09/07/2011    Prior to Admission medications   Medication Sig Start Date End Date Taking? Authorizing Provider  albuterol (PROVENTIL HFA;VENTOLIN HFA) 108 (90 Base) MCG/ACT inhaler Inhale 2 puffs into the lungs every 6 (six) hours as needed for wheezing or shortness of breath. 04/12/17  Yes Leiby Pigeon, Chrissie NoaWilliam, MD  amiodarone (PACERONE) 100 MG tablet Take 100 mg by mouth daily.   Yes [provider]  aspirin 81 MG chewable tablet Chew 81 mg by mouth  daily.   Yes [provider]  atorvastatin (LIPITOR) 40 MG tablet Take 1 tablet (40 mg total) by mouth daily. 04/26/17  Yes Hackney, Inetta Fermoina A, FNP  budesonide-formoterol (SYMBICORT) 160-4.5 MCG/ACT inhaler Inhale 2 puffs into the lungs 2 (two) times daily. 07/07/17  Yes Ajit Errico, Chrissie NoaWilliam, MD  carvedilol (COREG) 25 MG tablet Take 1 tablet (25 mg total) by mouth 2 (two) times daily. 04/07/17  Yes Anahid Eskelson, Chrissie NoaWilliam, MD  cetirizine (ZYRTEC) 10 MG tablet Take 10 mg by mouth daily.   Yes [provider]  Cholecalciferol (VITAMIN D3) 1000 units CAPS Take 1 capsule (1,000 Units total) by mouth daily. 04/05/17  Yes Teckla Christiansen, Chrissie NoaWilliam, MD  Cyanocobalamin (B-12) 1000 MCG TABS Take 1 tablet by mouth daily. 04/05/17  Yes Kendall Justo, Chrissie NoaWilliam, MD  fluticasone (FLONASE) 50 MCG/ACT nasal spray Place 2 sprays into both nostrils daily.   Yes [provider]  furosemide (LASIX) 80 MG tablet 1 tablet each morning and 1 1/2 tablets each afternoon 07/07/17  Yes Nikkie Liming, Chrissie NoaWilliam, MD  gabapentin (NEURONTIN) 600 MG tablet Take 1 tablet (600 mg total) by mouth 2 (two) times daily. 07/07/17  Yes Amiah Frohlich, Chrissie NoaWilliam, MD  losartan (COZAAR) 25 MG tablet Take 1 tablet (25 mg total) by mouth 2 (two) times daily. 04/07/17  Yes Adelis Docter, Chrissie NoaWilliam, MD  omeprazole (PRILOSEC) 40 MG capsule Take 1 capsule (40 mg total) by mouth daily. 06/30/17  Yes Matea Stanard, Chrissie NoaWilliam, MD  ondansetron (ZOFRAN ODT) 4 MG disintegrating tablet Take 1 tablet (4 mg total) by mouth every 8 (eight) hours as needed for nausea or vomiting. 07/06/17  Yes  Taheerah Guldin, Chrissie Noa, MD  OXYGEN Inhale 2 L into the lungs.   Yes [provider]  potassium chloride SA (K-DUR,KLOR-CON) 20 MEQ tablet Take 1 tablet (20 mEq total) by mouth daily. 03/24/17  Yes Hackney, Inetta Fermo A, FNP  levofloxacin (LEVAQUIN) 500 MG tablet Take 1 tablet (500 mg total) by mouth daily. 07/07/17   Schuyler Amor, MD    Allergies  Allergen Reactions  . Sulfa Antibiotics Itching    Pt states she gets "itching  inside and out"    Past Surgical History:  Procedure Laterality Date  . REPLACEMENT TOTAL KNEE    . TYMPANOSTOMY TUBE PLACEMENT Bilateral 03/19/2017    Social History   Tobacco Use  . Smoking status: Former Games developer  . Smokeless tobacco: Never Used  Substance Use Topics  . Alcohol use: No  . Drug use: No    Family History  Family history unknown: Yes    Medication list has been reviewed and updated.  Physical Examination: BP 117/72   Pulse 74   Resp 15   Ht 5\' 2"  (1.575 m)   Wt 179 lb 12.8 oz (81.6 kg)   SpO2 98%   BMI 32.89 kg/m   Physical Exam  Constitutional: She appears well-developed and well-nourished. No distress.  HENT:  Mouth/Throat: Oropharynx is clear and moist.  Cardiovascular: Normal rate, regular rhythm and normal heart sounds.  Pulmonary/Chest: Effort normal.  Rales RLL, otherwise clear  Musculoskeletal:  1+ BLE edema  Neurological: She is alert.  Skin: Skin is warm and dry. She is not diaphoretic.  Psychiatric: She has a normal mood and affect. Her behavior is normal.  Nursing note and vitals reviewed.   Assessment and Plan:  1. Pneumonia of right lower lobe due to infectious organism (HCC) Levaquin 500 mg daily x 7d  2. Chronic obstructive pulmonary disease, unspecified COPD type (HCC) Stable, refill Symbicort, consider steroids if sxs worsen  3. Chronic systolic heart failure (HCC) Stable on BB/ARB/Lasix/asa/statin  4. Chronic atrial fibrillation (HCC) On Coreg/amiodarone, consider d/c amio  5. Essential hypertension Well controlled on current regimen  6. Gastroesophageal reflux disease, esophagitis presence not specified Well controlled on PPI  7. Nausea Marginal control on Zofran prn, continue  Return if symptoms worsen or fail to improve in 48 hours.  Dionne Ano. Kingsley Spittle MD [facility]  07/08/2017

## 2017-07-09 ENCOUNTER — Encounter: Payer: Self-pay | Admitting: Family Medicine

## 2017-07-09 NOTE — Telephone Encounter (Signed)
Patient [facility] message.

## 2017-07-12 ENCOUNTER — Encounter: Payer: Self-pay | Admitting: Family

## 2017-07-13 NOTE — Telephone Encounter (Signed)
Patient MyChart response.

## 2017-07-15 ENCOUNTER — Other Ambulatory Visit
Admission: RE | Admit: 2017-07-15 | Discharge: 2017-07-15 | Disposition: A | Discharge status: Home / Self Care | Payer: Medicare Other | Source: Ambulatory Visit | Attending: Family Medicine | Admitting: Family Medicine

## 2017-07-15 ENCOUNTER — Encounter: Payer: Self-pay | Admitting: Family Medicine

## 2017-07-15 ENCOUNTER — Ambulatory Visit
Admission: RE | Admit: 2017-07-15 | Discharge: 2017-07-15 | Disposition: A | Discharge status: Home / Self Care | Payer: Medicare Other | Source: Ambulatory Visit | Attending: Family Medicine | Admitting: Family Medicine

## 2017-07-15 ENCOUNTER — Ambulatory Visit (INDEPENDENT_AMBULATORY_CARE_PROVIDER_SITE_OTHER): Payer: Medicare Other | Admitting: Family Medicine

## 2017-07-15 ENCOUNTER — Other Ambulatory Visit: Payer: Self-pay

## 2017-07-15 ENCOUNTER — Ambulatory Visit: Payer: Medicare Other | Admitting: Family

## 2017-07-15 ENCOUNTER — Ambulatory Visit: Payer: Medicare Other

## 2017-07-15 VITALS — BP 128/84 | HR 78 | Resp 16 | Ht 62.0 in | Wt 178.2 lb

## 2017-07-15 DIAGNOSIS — M25512 Pain in left shoulder: Secondary | ICD-10-CM

## 2017-07-15 DIAGNOSIS — I10 Essential (primary) hypertension: Secondary | ICD-10-CM | POA: Diagnosis not present

## 2017-07-15 DIAGNOSIS — R110 Nausea: Secondary | ICD-10-CM | POA: Diagnosis not present

## 2017-07-15 DIAGNOSIS — M19012 Primary osteoarthritis, left shoulder: Secondary | ICD-10-CM | POA: Insufficient documentation

## 2017-07-15 DIAGNOSIS — I480 Paroxysmal atrial fibrillation: Secondary | ICD-10-CM

## 2017-07-15 DIAGNOSIS — R0602 Shortness of breath: Secondary | ICD-10-CM

## 2017-07-15 DIAGNOSIS — J449 Chronic obstructive pulmonary disease, unspecified: Secondary | ICD-10-CM | POA: Diagnosis not present

## 2017-07-15 DIAGNOSIS — I5022 Chronic systolic (congestive) heart failure: Secondary | ICD-10-CM

## 2017-07-15 LAB — BASIC METABOLIC PANEL
Anion gap: 9 (ref 5–15)
BUN: 24 mg/dL — AB (ref 6–20)
CALCIUM: 8.5 mg/dL — AB (ref 8.9–10.3)
CO2: 30 mmol/L (ref 22–32)
CREATININE: 1.18 mg/dL — AB (ref 0.44–1.00)
Chloride: 101 mmol/L (ref 101–111)
GFR calc Af Amer: 52 mL/min — ABNORMAL LOW (ref >60)
GFR, EST NON AFRICAN AMERICAN: 45 mL/min — AB (ref >60)
Glucose, Bld: 105 mg/dL — ABNORMAL HIGH (ref 65–99)
Potassium: 4.7 mmol/L (ref 3.5–5.1)
SODIUM: 140 mmol/L (ref 135–145)

## 2017-07-15 LAB — CBC
HCT: 33.5 % — ABNORMAL LOW (ref 35.0–47.0)
Hemoglobin: 11.1 g/dL — ABNORMAL LOW (ref 12.0–16.0)
MCH: 28.3 pg (ref 26.0–34.0)
MCHC: 33 g/dL (ref 32.0–36.0)
MCV: 85.8 fL (ref 80.0–100.0)
PLATELETS: 179 10*3/uL (ref 150–440)
RBC: 3.91 MIL/uL (ref 3.80–5.20)
RDW: 14.7 % — ABNORMAL HIGH (ref 11.5–14.5)
WBC: 7 10*3/uL (ref 3.6–11.0)

## 2017-07-15 LAB — BRAIN NATRIURETIC PEPTIDE: B NATRIURETIC PEPTIDE 5: 135 pg/mL — AB (ref 0.0–100.0)

## 2017-07-15 LAB — POCT URINALYSIS DIPSTICK
BILIRUBIN UA: NEGATIVE
Glucose, UA: NEGATIVE
KETONES UA: NEGATIVE
Leukocytes, UA: NEGATIVE
NITRITE UA: NEGATIVE
PH UA: 6 (ref 5.0–8.0)
Protein, UA: NEGATIVE
Spec Grav, UA: 1.01 (ref 1.010–1.025)
UROBILINOGEN UA: 0.2 U/dL

## 2017-07-15 NOTE — Progress Notes (Signed)
Date:  07/15/2017   Name:  [name] MussBetsy Ellis Needs   DOB:  11/15/1945   MRN:  [id number]  PCP:  Schuyler AmorPlonk, Solan Vosler, MD    Chief Complaint: Pneumonia (Folow up- Needs Rx for Portable Oxygen B Tanks Quantity of 12.[facility] to monitor use of Oxygen but PCP to write Rx.  ); Arm Pain (Left arm pain radiating across front of chest severe all day. ); and Nausea (Zofran failed need Phenergan and will need Prior Auth for this but may get approved now. )   History of Present Illness:  This is a 71 y.o. female seen same day for dyspnea and L shoulder pain. Dx'd with RLL pneumonia last week, completed course of Levaquin, still coughing/wheezing but not as frequently, still SOB. Took extra Lasix this am for increased LE edema. Nausea not well controlled on Zofran, wants Phenergan back. Also c/o L shoulder pain x 1 week, hurtsd   Review of Systems:  Review of Systems  Constitutional: Negative for chills and fever.  Cardiovascular: Negative for chest pain.  Gastrointestinal: Negative for abdominal pain.  Genitourinary: Negative for difficulty urinating.  Neurological: Negative for syncope and light-headedness.    Patient Active Problem List   Diagnosis Date Noted  . Hyperlipidemia 07/01/2017  . DDD (degenerative disc disease), lumbar 04/30/2017  . B12 deficiency 04/30/2017  . Gait instability 03/31/2017  . Allergic rhinitis 03/31/2017  . Nausea 03/31/2017  . GERD (gastroesophageal reflux disease) 03/31/2017  . Chronic systolic heart failure (HCC) 03/24/2017  . HTN (hypertension) 03/24/2017  . COPD (chronic obstructive pulmonary disease) (HCC) 03/24/2017  . Chronic pain 03/24/2017  . MRSA carrier 03/18/2017  . History of ventricular tachycardia 03/18/2017  . Vitamin D deficiency 01/06/2017  . Atrial fibrillation (HCC) 06/15/2016  . Depression 06/15/2016  . Varicose veins of lower extremity with inflammation 09/07/2011    Prior to Admission medications   Medication Sig Start Date End Date Taking?  Authorizing Provider  albuterol (PROVENTIL HFA;VENTOLIN HFA) 108 (90 Base) MCG/ACT inhaler Inhale 2 puffs into the lungs every 6 (six) hours as needed for wheezing or shortness of breath. 04/12/17  Yes Mayford Alberg, Chrissie NoaWilliam, MD  amiodarone (PACERONE) 100 MG tablet Take 100 mg by mouth daily.   Yes [provider]  aspirin 81 MG chewable tablet Chew 81 mg by mouth daily.   Yes [provider]  atorvastatin (LIPITOR) 40 MG tablet Take 1 tablet (40 mg total) by mouth daily. 04/26/17  Yes Hackney, Inetta Fermoina A, FNP  budesonide-formoterol (SYMBICORT) 160-4.5 MCG/ACT inhaler Inhale 2 puffs into the lungs 2 (two) times daily. 07/07/17  Yes Abiel Antrim, Chrissie NoaWilliam, MD  carvedilol (COREG) 25 MG tablet Take 1 tablet (25 mg total) by mouth 2 (two) times daily. 04/07/17  Yes Bergen Melle, Chrissie NoaWilliam, MD  cetirizine (ZYRTEC) 10 MG tablet Take 10 mg by mouth daily.   Yes [provider]  Cholecalciferol (VITAMIN D3) 1000 units CAPS Take 1 capsule (1,000 Units total) by mouth daily. 04/05/17  Yes Ricka Westra, Chrissie NoaWilliam, MD  Cyanocobalamin (B-12) 1000 MCG TABS Take 1 tablet by mouth daily. 04/05/17  Yes Raeanna Soberanes, Chrissie NoaWilliam, MD  fluticasone (FLONASE) 50 MCG/ACT nasal spray Place 2 sprays into both nostrils daily.   Yes [provider]  furosemide (LASIX) 80 MG tablet 1 tablet each morning and 1 1/2 tablets each afternoon 07/07/17  Yes Izola Teague, Chrissie NoaWilliam, MD  gabapentin (NEURONTIN) 600 MG tablet Take 1 tablet (600 mg total) by mouth 2 (two) times daily. 07/07/17  Yes Laurisa Sahakian, Chrissie NoaWilliam, MD  losartan (COZAAR) 25 MG tablet  Take 1 tablet (25 mg total) by mouth 2 (two) times daily. 04/07/17  Yes Latonyia Lopata, Chrissie Noa, MD  omeprazole (PRILOSEC) 40 MG capsule Take 1 capsule (40 mg total) by mouth daily. 06/30/17  Yes Samul Mcinroy, Chrissie Noa, MD  ondansetron (ZOFRAN ODT) 4 MG disintegrating tablet Take 1 tablet (4 mg total) by mouth every 8 (eight) hours as needed for nausea or vomiting. 07/06/17  Yes Tramain Gershman, Chrissie Noa, MD  OXYGEN Inhale 2 L into the lungs.   Yes  [provider]  potassium chloride SA (K-DUR,KLOR-CON) 20 MEQ tablet Take 1 tablet (20 mEq total) by mouth daily. 03/24/17  Yes Delma Freeze, FNP    Allergies  Allergen Reactions  . Sulfa Antibiotics Itching    Pt states she gets "itching inside and out"    Past Surgical History:  Procedure Laterality Date  . REPLACEMENT TOTAL KNEE    . TYMPANOSTOMY TUBE PLACEMENT Bilateral 03/19/2017    Social History   Tobacco Use  . Smoking status: Former Games developer  . Smokeless tobacco: Never Used  Substance Use Topics  . Alcohol use: No  . Drug use: No    Family History  Family history unknown: Yes    Medication list has been reviewed and updated.  Physical Examination: BP 128/84   Pulse 78   Resp 16   Ht 5\' 2"  (1.575 m)   Wt 178 lb 3.2 oz (80.8 kg)   SpO2 98%   BMI 32.59 kg/m   Physical Exam  Constitutional: She appears well-developed and well-nourished. No distress.  Cardiovascular: Normal rate, regular rhythm and normal heart sounds.  Pulmonary/Chest: Effort normal and breath sounds normal.  Prior R basilar rales resolved  Musculoskeletal:  1+ BLE edema L shoulder tender over entire rotator cuff, unable to abduct  Neurological: She is alert.  Skin: Skin is warm and dry. She is not diaphoretic.  Psychiatric: She has a normal mood and affect. Her behavior is normal.  Nursing note and vitals reviewed.   Assessment and Plan:  1. Chronic obstructive pulmonary disease, unspecified COPD type (HCC) Doubt exacerbation but consider prednisone if blood work unremarkable  2. Chronic systolic heart failure (HCC) UA neg except tr blood, consider spironolactone - POCT Urinalysis Dipstick  3. Paroxysmal atrial fibrillation (HCC) EKG sinus rhythm, no change from October except low vottage, consider d/c amio  4. Essential hypertension Well controlled on current regimen  5. Shortness of breath CHF vs. COPD - DG Chest 2 View; Future - EKG 12-Lead - Basic  Metabolic Panel (BMET) - CBC - B Nat Peptide  6. Left shoulder pain, unspecified chronicity Likely RCS  - DG Shoulder Left; Future  7. Nausea Unclear etiology, may improve off amio, consider d/c Prilosec, change back to Phenergan if insurance will cover  Return if symptoms worsen or fail to improve.   45 minutes spent with pt over half in counseling  Merwin Breden M. Kingsley Spittle MD [facility]  07/15/2017

## 2017-07-16 ENCOUNTER — Other Ambulatory Visit: Payer: Self-pay | Admitting: Family Medicine

## 2017-07-16 ENCOUNTER — Encounter: Payer: Self-pay | Admitting: Family Medicine

## 2017-07-16 MED ORDER — FUROSEMIDE 80 MG PO TABS: 80.0000 mg | ORAL_TABLET | Freq: Two times a day (BID) | ORAL | 3 refills | Status: DC

## 2017-07-16 MED ORDER — ONDANSETRON 4 MG PO TBDP
4.0000 mg | ORAL_TABLET | Freq: Three times a day (TID) | ORAL | 2 refills | Status: DC | PRN
Start: 2017-07-16 — End: 2017-08-02

## 2017-07-16 MED ORDER — ACETAMINOPHEN 500 MG PO TABS: 1000.0000 mg | ORAL_TABLET | Freq: Two times a day (BID) | ORAL | 0 refills | Status: DC

## 2017-07-16 MED ORDER — SPIRONOLACTONE 25 MG PO TABS: 25.0000 mg | ORAL_TABLET | Freq: Every day | ORAL | 2 refills | Status: DC

## 2017-07-16 NOTE — Telephone Encounter (Signed)
Patient mychart message.

## 2017-07-21 ENCOUNTER — Ambulatory Visit (INDEPENDENT_AMBULATORY_CARE_PROVIDER_SITE_OTHER): Payer: Medicare Other | Admitting: Family Medicine

## 2017-07-21 ENCOUNTER — Encounter: Payer: Self-pay | Admitting: Family Medicine

## 2017-07-21 VITALS — BP 122/82 | HR 68 | Resp 16 | Ht 62.0 in | Wt 179.8 lb

## 2017-07-21 DIAGNOSIS — I5022 Chronic systolic (congestive) heart failure: Secondary | ICD-10-CM | POA: Diagnosis not present

## 2017-07-21 DIAGNOSIS — K219 Gastro-esophageal reflux disease without esophagitis: Secondary | ICD-10-CM | POA: Diagnosis not present

## 2017-07-21 DIAGNOSIS — I10 Essential (primary) hypertension: Secondary | ICD-10-CM

## 2017-07-21 DIAGNOSIS — R110 Nausea: Secondary | ICD-10-CM

## 2017-07-21 DIAGNOSIS — E538 Deficiency of other specified B group vitamins: Secondary | ICD-10-CM

## 2017-07-21 DIAGNOSIS — J309 Allergic rhinitis, unspecified: Secondary | ICD-10-CM

## 2017-07-21 DIAGNOSIS — G894 Chronic pain syndrome: Secondary | ICD-10-CM | POA: Diagnosis not present

## 2017-07-21 DIAGNOSIS — E785 Hyperlipidemia, unspecified: Secondary | ICD-10-CM

## 2017-07-21 DIAGNOSIS — M19012 Primary osteoarthritis, left shoulder: Secondary | ICD-10-CM | POA: Diagnosis not present

## 2017-07-21 DIAGNOSIS — E559 Vitamin D deficiency, unspecified: Secondary | ICD-10-CM

## 2017-07-21 DIAGNOSIS — G4700 Insomnia, unspecified: Secondary | ICD-10-CM

## 2017-07-21 DIAGNOSIS — J449 Chronic obstructive pulmonary disease, unspecified: Secondary | ICD-10-CM | POA: Diagnosis not present

## 2017-07-21 MED ORDER — MIRTAZAPINE 7.5 MG PO TABS: 7.5000 mg | ORAL_TABLET | Freq: Every day | ORAL | 2 refills | Status: DC

## 2017-07-21 MED ORDER — PREDNISONE 10 MG PO TABS: 10.0000 mg | ORAL_TABLET | Freq: Every day | ORAL | 0 refills | Status: DC

## 2017-07-21 NOTE — Progress Notes (Signed)
Date:  07/21/2017   Name:  [name] [name]   DOB:  10/28/1945   MRN:  [id number]  PCP:  Schuyler AmorPlonk, Rozell Kettlewell, MD    Chief Complaint: Enlarged Heart and Arthritis   History of Present Illness:  This is a 71 y.o. female seen for one week f/u. Amiodarone/K+ stopped, spironolactone started, and Lasix decreased after last visit. Not feeling much better, still coughing with significant DOE, edema about the same. Using Symbicort bid and albuterol prn frequently. On prednisone in past which helped a lot. L shoulder still hurts, XR showed AC jt degeneration. C/o insomnia, wants something to help sleep. [facility], [facility] in May.  Review of Systems:  Review of Systems  Constitutional: Negative for chills and fever.  Cardiovascular: Negative for chest pain.  Genitourinary: Negative for difficulty urinating.  Neurological: Negative for syncope and light-headedness.    Patient Active Problem List   Diagnosis Date Noted  . Insomnia 07/22/2017  . Hyperlipidemia 07/01/2017  . DDD (degenerative disc disease), lumbar 04/30/2017  . B12 deficiency 04/30/2017  . Gait instability 03/31/2017  . Allergic rhinitis 03/31/2017  . Nausea 03/31/2017  . GERD (gastroesophageal reflux disease) 03/31/2017  . Chronic systolic heart failure (HCC) 03/24/2017  . HTN (hypertension) 03/24/2017  . COPD (chronic obstructive pulmonary disease) (HCC) 03/24/2017  . Chronic pain 03/24/2017  . MRSA carrier 03/18/2017  . History of ventricular tachycardia 03/18/2017  . Vitamin D deficiency 01/06/2017  . Atrial fibrillation (HCC) 06/15/2016  . Depression 06/15/2016  . Varicose veins of lower extremity with inflammation 09/07/2011    Prior to Admission medications   Medication Sig Start Date End Date Taking? Authorizing Provider  acetaminophen (TYLENOL) 500 MG tablet Take 2 tablets (1,000 mg total) by mouth 2 (two) times daily. 07/16/17  Yes Jerrell Hart, Chrissie NoaWilliam, MD  albuterol (PROVENTIL HFA;VENTOLIN  HFA) 108 (90 Base) MCG/ACT inhaler Inhale 2 puffs into the lungs every 6 (six) hours as needed for wheezing or shortness of breath. 04/12/17  Yes Sullivan Blasing, Chrissie NoaWilliam, MD  aspirin 81 MG chewable tablet Chew 81 mg by mouth daily.   Yes [provider]  atorvastatin (LIPITOR) 40 MG tablet Take 1 tablet (40 mg total) by mouth daily. 04/26/17  Yes Hackney, Inetta Fermoina A, FNP  budesonide-formoterol (SYMBICORT) 160-4.5 MCG/ACT inhaler Inhale 2 puffs into the lungs 2 (two) times daily. 07/07/17  Yes Jorge Retz, Chrissie NoaWilliam, MD  carvedilol (COREG) 25 MG tablet Take 1 tablet (25 mg total) by mouth 2 (two) times daily. 04/07/17  Yes Tyrees Chopin, Chrissie NoaWilliam, MD  cetirizine (ZYRTEC) 10 MG tablet Take 10 mg by mouth daily.   Yes [provider]  Cholecalciferol (VITAMIN D3) 1000 units CAPS Take 1 capsule (1,000 Units total) by mouth daily. 04/05/17  Yes Tahja Liao, Chrissie NoaWilliam, MD  Cyanocobalamin (B-12) 1000 MCG TABS Take 1 tablet by mouth daily. 04/05/17  Yes Decoda Van, Chrissie NoaWilliam, MD  fluticasone (FLONASE) 50 MCG/ACT nasal spray Place 2 sprays into both nostrils daily.   Yes [provider]  furosemide (LASIX) 80 MG tablet Take 1 tablet (80 mg total) by mouth 2 (two) times daily. 07/16/17  Yes Biddie Sebek, Chrissie NoaWilliam, MD  gabapentin (NEURONTIN) 600 MG tablet Take 1 tablet (600 mg total) by mouth 2 (two) times daily. 07/07/17  Yes Sherol Sabas, Chrissie NoaWilliam, MD  losartan (COZAAR) 25 MG tablet Take 1 tablet (25 mg total) by mouth 2 (two) times daily. 04/07/17  Yes Davonna Ertl, Chrissie NoaWilliam, MD  omeprazole (PRILOSEC) 40 MG capsule Take 1 capsule (40 mg total) by mouth daily. 06/30/17  Yes Kairen Hallinan, Chrissie Noa, MD  ondansetron (ZOFRAN ODT) 4 MG disintegrating tablet Take 1 tablet (4 mg total) by mouth every 8 (eight) hours as needed for nausea or vomiting. 07/16/17  Yes Avonne Berkery, Chrissie Noa, MD  OXYGEN Inhale 2 L into the lungs.   Yes [provider]  spironolactone (ALDACTONE) 25 MG tablet Take 1 tablet (25 mg total) by mouth daily. 07/16/17  Yes Cameshia Cressman, Chrissie Noa, MD   mirtazapine (REMERON) 7.5 MG tablet Take 1 tablet (7.5 mg total) by mouth at bedtime. 07/21/17   Avir Deruiter, Chrissie Noa, MD  predniSONE (DELTASONE) 10 MG tablet Take 1 tablet (10 mg total) by mouth daily with breakfast. 07/21/17   Schuyler Amor, MD    Allergies  Allergen Reactions  . Sulfa Antibiotics Itching    Pt states she gets "itching inside and out"    Past Surgical History:  Procedure Laterality Date  . REPLACEMENT TOTAL KNEE    . TYMPANOSTOMY TUBE PLACEMENT Bilateral 03/19/2017    Social History   Tobacco Use  . Smoking status: Former Games developer  . Smokeless tobacco: Never Used  Substance Use Topics  . Alcohol use: No  . Drug use: No    Family History  Family history unknown: Yes    Medication list has been reviewed and updated.  Physical Examination: BP 122/82   Pulse 68   Resp 16   Ht 5\' 2"  (1.575 m)   Wt 179 lb 12.8 oz (81.6 kg)   SpO2 98%   BMI 32.89 kg/m   Physical Exam  Constitutional: She appears well-developed and well-nourished.  Cardiovascular: Normal rate, regular rhythm and normal heart sounds.  Pulmonary/Chest:  Diffuse exp rhonchi  Musculoskeletal:  1+ BLE edema  Neurological: She is alert.  Skin: Skin is warm and dry.  Psychiatric: She has a normal mood and affect. Her behavior is normal.  Nursing note and vitals reviewed.   Assessment and Plan:  1. Chronic obstructive pulmonary disease, unspecified COPD type (HCC) Poor control, trial low dose prednisone x 7d, consider long term with hospice re-referral  2. Chronic systolic heart failure (HCC) Stable on BB/ARB/Lasix/Aldactone, consider BMP next visit  3. Essential hypertension Well controlled on current regimen  4. Insomnia, unspecified type Trial Remeron 7.5 mg qhs  5. Allergic rhinitis, unspecified seasonality, unspecified trigger Well controlled on Zyrtec/Flonase  6. Gastroesophageal reflux disease, esophagitis presence not specified Well controlled on Prilosec, intolerant  taper  7. Nausea Adequate control on Zofran prn  8. Hyperlipidemia, unspecified hyperlipidemia type Well controlled on Lipitor  9. Chronic pain syndrome Adequate control on gabapentin  10. Arthritis of left acromioclavicular joint Should improve with prednisone, cont Tylenol bid  11. B12 deficiency On supplement, recheck next visit  12. Vitamin D deficiency On supplement, recheck next visit  Return in about 2 weeks (around 08/04/2017).  Dionne Ano. Kingsley Spittle MD [facility]  07/22/2017

## 2017-07-22 ENCOUNTER — Telehealth: Payer: Self-pay

## 2017-07-22 DIAGNOSIS — F17200 Nicotine dependence, unspecified, uncomplicated: Secondary | ICD-10-CM | POA: Diagnosis not present

## 2017-07-22 DIAGNOSIS — I482 Chronic atrial fibrillation: Secondary | ICD-10-CM | POA: Diagnosis not present

## 2017-07-22 DIAGNOSIS — F320 Major depressive disorder, single episode, mild: Secondary | ICD-10-CM | POA: Diagnosis not present

## 2017-07-22 DIAGNOSIS — Z515 Encounter for palliative care: Secondary | ICD-10-CM | POA: Diagnosis not present

## 2017-07-22 DIAGNOSIS — G4700 Insomnia, unspecified: Secondary | ICD-10-CM | POA: Insufficient documentation

## 2017-07-22 DIAGNOSIS — G894 Chronic pain syndrome: Secondary | ICD-10-CM | POA: Diagnosis not present

## 2017-07-22 DIAGNOSIS — I5022 Chronic systolic (congestive) heart failure: Secondary | ICD-10-CM | POA: Diagnosis not present

## 2017-07-22 DIAGNOSIS — J449 Chronic obstructive pulmonary disease, unspecified: Secondary | ICD-10-CM | POA: Diagnosis not present

## 2017-07-22 NOTE — Telephone Encounter (Signed)
Spoke to [facility] and they wanted to know whjy we can not write Rx and have them go get Oxygen for her. I explained Cardio has the Rx and is waiting on patient to go do the O2 stats on room air while walking. She has missed appt and rescheduled but now has to wait to be seen Aug 09 2017. Home Health is worried she will end up running out of her oxygen. They did a test at home and she could not walk more than one minute without Oxygen she started coughing and Wheezing bad. Advised to call Cardio and move appt up or try to get stats and call us back.

## 2017-07-25 ENCOUNTER — Encounter: Payer: Self-pay | Admitting: Family Medicine

## 2017-07-26 ENCOUNTER — Other Ambulatory Visit: Payer: Self-pay | Admitting: Family Medicine

## 2017-07-26 MED ORDER — CARVEDILOL 25 MG PO TABS: 25.0000 mg | ORAL_TABLET | Freq: Two times a day (BID) | ORAL | 3 refills | Status: DC

## 2017-07-26 NOTE — Telephone Encounter (Signed)
[facility] Message.

## 2017-08-02 ENCOUNTER — Ambulatory Visit (INDEPENDENT_AMBULATORY_CARE_PROVIDER_SITE_OTHER): Payer: Self-pay | Admitting: Family Medicine

## 2017-08-02 ENCOUNTER — Encounter: Payer: Self-pay | Admitting: Family Medicine

## 2017-08-02 VITALS — BP 126/84 | HR 78 | Resp 16 | Ht 62.0 in | Wt 179.5 lb

## 2017-08-02 DIAGNOSIS — G894 Chronic pain syndrome: Secondary | ICD-10-CM | POA: Diagnosis not present

## 2017-08-02 DIAGNOSIS — J449 Chronic obstructive pulmonary disease, unspecified: Secondary | ICD-10-CM

## 2017-08-02 DIAGNOSIS — E559 Vitamin D deficiency, unspecified: Secondary | ICD-10-CM

## 2017-08-02 DIAGNOSIS — R300 Dysuria: Secondary | ICD-10-CM

## 2017-08-02 DIAGNOSIS — E538 Deficiency of other specified B group vitamins: Secondary | ICD-10-CM | POA: Diagnosis not present

## 2017-08-02 DIAGNOSIS — I5022 Chronic systolic (congestive) heart failure: Secondary | ICD-10-CM | POA: Diagnosis not present

## 2017-08-02 MED ORDER — GABAPENTIN 600 MG PO TABS
600.0000 mg | ORAL_TABLET | Freq: Three times a day (TID) | ORAL | 0 refills | Status: DC
Start: 2017-08-02 — End: 2017-08-25

## 2017-08-02 MED ORDER — ACETAMINOPHEN 500 MG PO TABS: 1000.0000 mg | ORAL_TABLET | Freq: Three times a day (TID) | ORAL | 0 refills | Status: AC

## 2017-08-02 NOTE — Patient Instructions (Signed)
Increase gabapentin to three times daily. Increase Tylenol 500 mg to 2 tablets three times daily

## 2017-08-03 ENCOUNTER — Other Ambulatory Visit: Payer: Self-pay | Admitting: Family Medicine

## 2017-08-03 LAB — VITAMIN D 25 HYDROXY (VIT D DEFICIENCY, FRACTURES): VIT D 25 HYDROXY: 36 ng/mL (ref 30.0–100.0)

## 2017-08-03 LAB — BASIC METABOLIC PANEL
BUN/Creatinine Ratio: 21 (ref 12–28)
BUN: 37 mg/dL — ABNORMAL HIGH (ref 8–27)
CO2: 26 mmol/L (ref 20–29)
Calcium: 8.7 mg/dL (ref 8.7–10.3)
Chloride: 98 mmol/L (ref 96–106)
Creatinine, Ser: 1.76 mg/dL — ABNORMAL HIGH (ref 0.57–1.00)
GFR calc Af Amer: 33 mL/min/{1.73_m2} — ABNORMAL LOW (ref >59)
GFR calc non Af Amer: 28 mL/min/{1.73_m2} — ABNORMAL LOW (ref >59)
GLUCOSE: 84 mg/dL (ref 65–99)
POTASSIUM: 4.3 mmol/L (ref 3.5–5.2)
SODIUM: 142 mmol/L (ref 134–144)

## 2017-08-03 LAB — VITAMIN B12: VITAMIN B 12: 599 pg/mL (ref 232–1245)

## 2017-08-03 LAB — SEDIMENTATION RATE: Sed Rate: 6 mm/hr (ref 0–40)

## 2017-08-03 MED ORDER — FUROSEMIDE 80 MG PO TABS: 80.0000 mg | ORAL_TABLET | Freq: Every day | ORAL | 3 refills | Status: DC

## 2017-08-03 NOTE — Progress Notes (Signed)
Date:  08/02/2017   Name:  [name] [name]   DOB:  [birth year]   MRN:  [id number]  PCP:  Adline Potter, MD    Chief Complaint: COPD (f/u ) and Hip Pain   History of Present Illness:  This is a 72 y.o. female seen for two week f/u. Trial prednisone 10 mg daily did not seem to help much. C/o increased back/hip/shoulder/knee pain. Insomnia improved on Remeron. Palliative care NP gave Phenergan rx for nausea. Still coughing with occ green/brown phlegm. Some dysuria today. Oxygen rx sent by cards.  Review of Systems:  Review of Systems  Constitutional: Negative for chills and fever.  Cardiovascular: Negative for chest pain and leg swelling.  Neurological: Negative for syncope and light-headedness.    Patient Active Problem List   Diagnosis Date Noted  . Insomnia 07/22/2017  . Hyperlipidemia 07/01/2017  . DDD (degenerative disc disease), lumbar 04/30/2017  . B12 deficiency 04/30/2017  . Gait instability 03/31/2017  . Allergic rhinitis 03/31/2017  . Nausea 03/31/2017  . GERD (gastroesophageal reflux disease) 03/31/2017  . Chronic systolic heart failure ([place]) 03/24/2017  . HTN (hypertension) 03/24/2017  . COPD (chronic obstructive pulmonary disease) ([place]) 03/24/2017  . Chronic pain 03/24/2017  . MRSA carrier 03/18/2017  . History of ventricular tachycardia 03/18/2017  . Admission for palliative care 03/10/2017  . Vitamin D deficiency 01/06/2017  . Chronic atrial fibrillation ([place]) 06/15/2016  . Depression 06/15/2016  . Tobacco use disorder 03/24/2016  . Varicose veins of lower extremity with inflammation 09/07/2011    Prior to Admission medications   Medication Sig Start Date End Date Taking? Authorizing Provider  acetaminophen (TYLENOL) 500 MG tablet Take 2 tablets (1,000 mg total) by mouth 3 (three) times daily. 08/02/17  Yes Haset Oaxaca, Gwyndolyn Saxon, MD  albuterol (PROVENTIL HFA;VENTOLIN HFA) 108 (90 Base) MCG/ACT inhaler Inhale 2 puffs into the lungs every 6 (six) hours as needed for  wheezing or shortness of breath. 04/12/17  Yes Demiyah Fischbach, Gwyndolyn Saxon, MD  aspirin 81 MG chewable tablet Chew 81 mg by mouth daily.   Yes [provider]  atorvastatin (LIPITOR) 40 MG tablet Take 1 tablet (40 mg total) by mouth daily. 04/26/17  Yes Hackney, Otila Kluver A, FNP  budesonide-formoterol (SYMBICORT) 160-4.5 MCG/ACT inhaler Inhale 2 puffs into the lungs 2 (two) times daily. 07/07/17  Yes Ayomikun Starling, Gwyndolyn Saxon, MD  carvedilol (COREG) 25 MG tablet Take 1 tablet (25 mg total) by mouth 2 (two) times daily. 07/26/17  Yes Meylin Stenzel, Gwyndolyn Saxon, MD  cetirizine (ZYRTEC) 10 MG tablet Take 10 mg by mouth. 07/24/17 07/24/18 Yes [provider]  Cholecalciferol (VITAMIN D3) 1000 units CAPS Take by mouth. 07/24/17 07/24/18 Yes [provider]  Cyanocobalamin (B-12) 1000 MCG TABS Take 1 tablet by mouth daily. 04/05/17  Yes Toneka Fullen, Gwyndolyn Saxon, MD  fluticasone (FLONASE) 50 MCG/ACT nasal spray Place 2 sprays into both nostrils daily.   Yes [provider]  losartan (COZAAR) 25 MG tablet Take 25 mg by mouth. 07/24/17  Yes [provider]  mirtazapine (REMERON) 7.5 MG tablet Take 7.5 mg by mouth. 07/21/17  Yes [provider]  omeprazole (PRILOSEC) 40 MG capsule Take 40 mg by mouth. 07/24/17 07/24/18 Yes [provider]  OXYGEN Inhale 2 L into the lungs.   Yes [provider]  promethazine (PHENERGAN) 25 MG tablet Take 12.5 mg by mouth every 6 (six) hours as needed for nausea. 07/24/17 08/23/17 Yes [provider]  spironolactone (ALDACTONE) 25 MG tablet Take 25 mg by mouth. 07/24/17 07/24/18 Yes [provider]  furosemide (LASIX) 80 MG tablet Take 1 tablet (80 mg total) by mouth daily. 08/03/17   Jamesrobert Ohanesian, Gwyndolyn Saxon, MD  gabapentin (NEURONTIN) 600 MG tablet Take 1 tablet (600 mg total) by mouth 3 (three) times daily. 08/02/17   Adline Potter, MD    Allergies  Allergen Reactions  . Sulfa Antibiotics Itching    Pt states she gets "itching inside and out"     Past Surgical History:  Procedure Laterality Date  . REPLACEMENT TOTAL KNEE    . TYMPANOSTOMY TUBE PLACEMENT Bilateral 03/19/2017    Social History   Tobacco Use  . Smoking status: Former Research scientist (life sciences)  . Smokeless tobacco: Never Used  Substance Use Topics  . Alcohol use: No  . Drug use: No    Family History  Family history unknown: Yes    Medication list has been reviewed and updated.  Physical Examination: BP 126/84   Pulse 78   Resp 16   Ht '5\' 2"'  (1.575 m)   Wt 179 lb 8 oz (81.4 kg)   SpO2 99% Comment: 3 LITERS  BMI 32.83 kg/m   Physical Exam  Constitutional: She appears well-developed and well-nourished.  Cardiovascular: Normal rate, regular rhythm and normal heart sounds.  Pulmonary/Chest: Effort normal.  Diffuse rhonchi  Musculoskeletal: She exhibits no edema.  Neurological: She is alert.  Skin: Skin is warm and dry.  Psychiatric: She has a normal mood and affect. Her behavior is normal.  Nursing note and vitals reviewed.   Assessment and Plan:  1. Chronic systolic heart failure (HCC) Stable on BB/ARB/Lasix/Aldactone - Basic Metabolic Panel (BMET)  2. Chronic obstructive pulmonary disease, unspecified COPD type ([place]) Marginal control on Symbicort/albuterol prn, prednisone no significant help  3. Chronic pain syndrome Increase gabapentin to 600 mg tid and Tylenol to 1000 mg tid - Sed Rate (ESR)  4. Vitamin D deficiency On supplement - Vitamin D (25 hydroxy)  5. B12 deficiency On supplement - B12  Return in about 4 weeks (around 08/30/2017).  Satira Anis. [facility]  08/03/2017

## 2017-08-04 ENCOUNTER — Encounter: Payer: Self-pay | Admitting: Family

## 2017-08-04 ENCOUNTER — Encounter: Payer: Self-pay | Admitting: Family Medicine

## 2017-08-04 ENCOUNTER — Ambulatory Visit: Payer: Medicare HMO | Attending: Family

## 2017-08-04 VITALS — BP 101/76 | HR 64 | Resp 18 | Ht 62.0 in | Wt 182.0 lb

## 2017-08-04 DIAGNOSIS — J449 Chronic obstructive pulmonary disease, unspecified: Secondary | ICD-10-CM | POA: Diagnosis not present

## 2017-08-04 DIAGNOSIS — Z79899 Other long term (current) drug therapy: Secondary | ICD-10-CM | POA: Diagnosis not present

## 2017-08-04 DIAGNOSIS — Z9981 Dependence on supplemental oxygen: Secondary | ICD-10-CM | POA: Diagnosis not present

## 2017-08-04 DIAGNOSIS — Z87891 Personal history of nicotine dependence: Secondary | ICD-10-CM | POA: Diagnosis not present

## 2017-08-04 DIAGNOSIS — I110 Hypertensive heart disease with heart failure: Secondary | ICD-10-CM | POA: Insufficient documentation

## 2017-08-04 DIAGNOSIS — I509 Heart failure, unspecified: Secondary | ICD-10-CM | POA: Diagnosis not present

## 2017-08-04 DIAGNOSIS — G8929 Other chronic pain: Secondary | ICD-10-CM | POA: Diagnosis not present

## 2017-08-04 DIAGNOSIS — I5022 Chronic systolic (congestive) heart failure: Secondary | ICD-10-CM

## 2017-08-04 LAB — POCT URINALYSIS DIPSTICK
Bilirubin, UA: NEGATIVE
GLUCOSE UA: NEGATIVE
KETONES UA: NEGATIVE
LEUKOCYTES UA: NEGATIVE
Nitrite, UA: NEGATIVE
Protein, UA: NEGATIVE
RBC UA: NEGATIVE
SPEC GRAV UA: 1.01 (ref 1.010–1.025)
Urobilinogen, UA: 0.2 E.U./dL
pH, UA: 6.5 (ref 5.0–8.0)

## 2017-08-04 NOTE — Telephone Encounter (Signed)
Patient mychart message.

## 2017-08-04 NOTE — Progress Notes (Signed)
Patient ID: [name] [name], female    DOB: Dec 25, 1945, 72 y.o.   MRN: [id number]  HPI  [name] [name] is a 72 y/o female with a history of HTN, COPD, chronic pain, previous tobacco use and chronic heart failure.   Echo report from 03/15/17 reviewed and showed an EF of 25-30% along with severe MR. This is unchanged from [facility] November 2017.  Admitted 03/13/17 due to sepsis and HF. Treated with antibiotics. Cardiology consult obtained. Initially needed IV diuretics and transitioned to oral diuretics with net loss of ~6.6 liters of fluid. Oxygen continued. Discharged home after 5 days.  She presents today for a nurse visit with a chief complaint of moderate shortness of breath with minimal exertion. She describes this as chronic in nature having been present for several years with varying levels of severity. She has associated fatigue and occasional chest tightness along with this. She denies any edema or weight gain. She is able to maintain an oxygen stat of 100% with 3 liters of oxygen at rest. When on room air her oxygen stats drop down to 93% at rest when patient is engaged in conversation. Patient preformed a 2 minute walking test in the clinic on room air where her oxygen stats dropped down to 85%. Patient was also very unsteady and complained of feeling very fatigued when she has to walk with out her oxygen on. After the walk test patient was place on  3 liters of oxygen and her stats returned to normal.   Past Medical History:  Diagnosis Date  . CHF (congestive heart failure) (HCC)   . COPD (chronic obstructive pulmonary disease) (HCC)   . Hypertension   . Lung nodule    Past Surgical History:  Procedure Laterality Date  . REPLACEMENT TOTAL KNEE     No family history on file. Social History  Substance Use Topics  . Smoking status: Former Games developer  . Smokeless tobacco: Never Used  . Alcohol use No   Allergies  Allergen Reactions  . Sulfa Antibiotics Itching   Prior to Admission  medications   Medication Sig Start Date End Date Taking? Authorizing Provider  acetaminophen (TYLENOL) 500 MG tablet Take 2 tablets (1,000 mg total) by mouth every 8 (eight) hours as needed. 04/30/17  Yes Plonk, Chrissie Noa, MD  albuterol (PROVENTIL HFA;VENTOLIN HFA) 108 (90 Base) MCG/ACT inhaler Inhale 2 puffs into the lungs every 6 (six) hours as needed for wheezing or shortness of breath. 04/12/17  Yes Plonk, Chrissie Noa, MD  amiodarone (PACERONE) 100 MG tablet Take 100 mg by mouth daily.   Yes [provider]  aspirin 81 MG chewable tablet Chew 81 mg by mouth daily.   Yes [provider]  atorvastatin (LIPITOR) 40 MG tablet Take 1 tablet (40 mg total) by mouth daily. 04/26/17  Yes Hackney, Tina A, FNP  benzonatate (TESSALON) 100 MG capsule Take 100 mg by mouth every 6 (six) hours as needed. 03/25/17  Yes [provider]  budesonide-formoterol (SYMBICORT) 160-4.5 MCG/ACT inhaler Inhale 2 puffs into the lungs 2 (two) times daily.   Yes [provider]  carvedilol (COREG) 25 MG tablet Take 1 tablet (25 mg total) by mouth 2 (two) times daily. 04/07/17  Yes Plonk, Chrissie Noa, MD  Cholecalciferol (VITAMIN D3) 1000 units CAPS Take 1 capsule (1,000 Units total) by mouth daily. 04/05/17  Yes Plonk, Chrissie Noa, MD  Cyanocobalamin (B-12) 1000 MCG TABS Take 1 tablet by mouth daily. 04/05/17  Yes Plonk, Chrissie Noa, MD  DULoxetine (CYMBALTA) 30  MG capsule Take 1 capsule (30 mg total) by mouth daily. 04/30/17  Yes Plonk, Chrissie Noa, MD  fluticasone (FLONASE) 50 MCG/ACT nasal spray Place 2 sprays into both nostrils daily.   Yes [provider]  furosemide (LASIX) 80 MG tablet Take 1 tablet (80 mg total) by mouth 2 (two) times daily. 05/06/17 08/04/17 Yes Ok Anis, NP  gabapentin (NEURONTIN) 300 MG capsule Take 1 capsule (300 mg total) by mouth at bedtime. 04/30/17  Yes Plonk, Chrissie Noa, MD  losartan (COZAAR) 25 MG tablet Take 1 tablet (25 mg total) by mouth 2 (two) times daily. 04/07/17   Yes Plonk, Chrissie Noa, MD  omeprazole (PRILOSEC) 20 MG capsule Take 40 mg by mouth daily.    Yes [provider]  ondansetron (ZOFRAN) 8 MG tablet Take 1 tablet (8 mg total) by mouth every 8 (eight) hours as needed for nausea or vomiting. 05/10/17  Yes Plonk, William, MD  OXYGEN Inhale 2 L into the lungs.   Yes [provider]  potassium chloride SA (K-DUR,KLOR-CON) 20 MEQ tablet Take 1 tablet (20 mEq total) by mouth daily. 03/24/17  Yes Clarisa Kindred A, FNP  promethazine (PHENERGAN) 25 MG tablet Take 1 tablet (25 mg total) by mouth every 6 (six) hours as needed for nausea. 05/05/17  Yes Plonk, Chrissie Noa, MD   Vitals:   08/04/17 1438  BP: 101/76  Pulse: 64  Resp: 18  SpO2: 100%   Filed Weights   08/04/17 1438  Weight: 182 lb (82.6 kg)    Assessment & Plan:  1. COPD- - wears oxygen at 3L around the clock - walk test showed oxygen drop to 85%  - oxygen level maintained at 100% at 3 L Prescription for oxygen renewal to be sent to [facility].    Return on August 09, 2017 to see Clarisa Kindred FNP for your normally scheduled office visit.

## 2017-08-04 NOTE — Telephone Encounter (Signed)
Pt. message

## 2017-08-06 ENCOUNTER — Other Ambulatory Visit: Payer: Self-pay

## 2017-08-08 NOTE — Progress Notes (Signed)
Patient ID: [name] [name], female    DOB: 1945-12-09, 72 y.o.   MRN: [id number]  HPI  [name] [name] is a 72 y/o female with a history of HTN, COPD, chronic pain, previous tobacco use and chronic heart failure.   Echo report from 03/15/17 reviewed and showed an EF of 25-30% along with severe MR. This is unchanged from [facility] November 2017.  Admitted 03/13/17 due to sepsis and HF. Treated with antibiotics. Cardiology consult obtained. Initially needed IV diuretics and transitioned to oral diuretics with net loss of ~6.6 liters of fluid. Oxygen continued. Discharged home after 5 days.  She presents today for her follow-up visit with a chief complaint of moderate shortness of breath upon minimal exertion. She describes this as chronic in nature having been present for several years with varying levels of severity. She has associated fatigue, weakness, back pain, right hip pain, difficulty sleeping and gradual weight gain. She denies any edema, chest pain or abdominal distention. Wears her oxygen at 2-3L around the clock.   Past Medical History:  Diagnosis Date  . CHF (congestive heart failure) (HCC)   . COPD (chronic obstructive pulmonary disease) (HCC)   . Hypertension   . Lung nodule    Past Surgical History:  Procedure Laterality Date  . REPLACEMENT TOTAL KNEE     No family history on file. Social History  Substance Use Topics  . Smoking status: Former Games developer  . Smokeless tobacco: Never Used  . Alcohol use No   Allergies  Allergen Reactions  . Sulfa Antibiotics Itching   Prior to Admission medications   Medication Sig Start Date End Date Taking? Authorizing Provider  acetaminophen (TYLENOL) 500 MG tablet Take 2 tablets (1,000 mg total) by mouth 3 (three) times daily. 08/02/17  Yes Plonk, Chrissie Noa, MD  albuterol (PROVENTIL HFA;VENTOLIN HFA) 108 (90 Base) MCG/ACT inhaler Inhale 2 puffs into the lungs every 6 (six) hours as needed for wheezing or shortness of breath. 04/12/17  Yes Plonk, Chrissie Noa,  MD  aspirin 81 MG chewable tablet Chew 81 mg by mouth daily.   Yes [provider]  atorvastatin (LIPITOR) 40 MG tablet Take 1 tablet (40 mg total) by mouth daily. 04/26/17  Yes Monzerrat Wellen, Inetta Fermo A, FNP  budesonide-formoterol (SYMBICORT) 160-4.5 MCG/ACT inhaler Inhale 2 puffs into the lungs 2 (two) times daily. 07/07/17  Yes Plonk, Chrissie Noa, MD  carvedilol (COREG) 25 MG tablet Take 1 tablet (25 mg total) by mouth 2 (two) times daily. 07/26/17  Yes Plonk, Chrissie Noa, MD  cetirizine (ZYRTEC) 10 MG tablet Take 10 mg by mouth. 07/24/17 07/24/18 Yes [provider]  Cholecalciferol (VITAMIN D3) 1000 units CAPS Take by mouth. 07/24/17 07/24/18 Yes [provider]  Cyanocobalamin (B-12) 1000 MCG TABS Take 1 tablet by mouth daily. 04/05/17  Yes Plonk, Chrissie Noa, MD  fluticasone (FLONASE) 50 MCG/ACT nasal spray Place 2 sprays into both nostrils daily.   Yes [provider]  furosemide (LASIX) 80 MG tablet Take 1 tablet (80 mg total) by mouth daily. 08/03/17  Yes Plonk, Chrissie Noa, MD  gabapentin (NEURONTIN) 600 MG tablet Take 1 tablet (600 mg total) by mouth 3 (three) times daily. 08/02/17  Yes Plonk, Chrissie Noa, MD  losartan (COZAAR) 25 MG tablet Take 25 mg by mouth. 07/24/17  Yes [provider]  mirtazapine (REMERON) 7.5 MG tablet Take 7.5 mg by mouth. 07/21/17  Yes [provider]  omeprazole (PRILOSEC) 40 MG capsule Take 40 mg by mouth. 07/24/17 07/24/18 Yes [provider]  OXYGEN Inhale 2  L into the lungs.   Yes [provider]  promethazine (PHENERGAN) 25 MG tablet Take 12.5 mg by mouth every 6 (six) hours as needed for nausea. 07/24/17 08/23/17 Yes [provider]  spironolactone (ALDACTONE) 25 MG tablet Take 25 mg by mouth. 07/24/17 07/24/18 Yes [provider]   Review of Systems  Constitutional: Positive for fatigue. Negative for appetite change.  HENT: Positive for hearing loss. Negative for congestion, postnasal drip, ear  pain and sore throat.   Eyes: Negative.   Respiratory: Positive for shortness of breath. Negative for cough and wheezing or chest pain.   Cardiovascular:  Negative for chest pain, palpitations and leg swelling.  Gastrointestinal: Negative for abdominal distention and pain Endocrine: Negative.   Genitourinary: Negative.   Musculoskeletal: Positive for back pain & right hip pain. Negative for neck pain.  Skin: Negative.   Allergic/Immunologic: Negative.   Neurological: Positive for weakness, light-headedness. Negative for dizziness, headaches.  Hematological: Negative for adenopathy. Bruises/bleeds easily.  Psychiatric/Behavioral: Positive for sleep disturbance (not sleeping well; unsure of why). Negative for dysphoric mood. The patient is not nervous/anxious.    Vitals:   08/09/17 1344  BP: 119/66  Pulse: 75  Resp: (!) 22  SpO2: 100%  Weight: 185 lb 8 oz (84.1 kg)  Height: 5\' 2"  (1.575 m)   Wt Readings from Last 3 Encounters:  08/09/17 185 lb 8 oz (84.1 kg)  08/04/17 182 lb (82.6 kg)  08/02/17 179 lb 8 oz (81.4 kg)   Lab Results  Component Value Date   CREATININE 1.76 (H) 08/02/2017   CREATININE 1.18 (H) 07/15/2017   CREATININE 0.89 05/06/2017    Physical Exam  Constitutional: She is oriented to person, place, and time. She appears well-developed and well-nourished.  HENT:  Head: Normocephalic and atraumatic.  Right Ear: Decreased hearing is noted.  Left Ear: Decreased hearing is noted.  Neck: Normal range of motion. Neck supple. No JVD present.  Cardiovascular: Normal rate and regular rhythm.   Pulmonary/Chest: Effort normal and breath sounds normal.  Abdominal: Soft. She exhibits no distension. There is no tenderness.  Musculoskeletal: She exhibits no edema or tenderness.  Neurological: She is alert and oriented to person, place, and time.  Skin: Skin is warm and dry.  Psychiatric: She has a normal mood and affect. Her behavior is normal. Thought content normal.   Nursing note and vitals reviewed.   Assessment & Plan:  1: Chronic heart failure with reduced ejection fraction- - NYHA class III - euvolemic today - weighing daily and she was reminded to call for an overnight weight gain of >2 pounds or a weekly weight gain of >5 pounds - has recently finished a prednisone taper and says that her appetite was greatly increased during the time - admits to not getting any exercise due to the pain in her back and hip - adds salt "sometimes to eggs". Reminded to closely follow a 2000mg  sodium diet - could consider changing her losartan to entresto in the future; patient not interested today - saw cardiology Brion Aliment) 05/06/17 - patient reports receiving the flu vaccine for this season  2: HTN- - BP looks good today - saw PCP (Plonk) on 08/02/17 - BMP done 08/02/17 reviewed and shows sodium 142, potassium 4.3 and GFR 28. GFR has been steadily declining over the last few months.   3: COPD- - wears oxygen at 2-3L around the clock - recently came in for a walk test regarding her oxygen use  4: Degenerative disc disease- -  says that her back/right hip hurt so bad that she can't get comfortable sitting, standing or laying down - currently taking gabapentin 600mg  TID but without any easing of pain - advised patient to contact her PCP about pain management referral  Patient did not bring her medications nor a list. Each medication was verbally reviewed with the patient and she was encouraged to bring the bottles to every visit to confirm accuracy of list.  Return in 3 months or sooner for any questions/problems before then.

## 2017-08-09 ENCOUNTER — Other Ambulatory Visit: Payer: Self-pay

## 2017-08-09 ENCOUNTER — Ambulatory Visit: Payer: Medicare HMO | Attending: Family | Admitting: Family

## 2017-08-09 ENCOUNTER — Other Ambulatory Visit: Payer: Self-pay | Admitting: Family Medicine

## 2017-08-09 ENCOUNTER — Encounter: Payer: Self-pay | Admitting: Family

## 2017-08-09 ENCOUNTER — Encounter: Payer: Self-pay | Admitting: Family Medicine

## 2017-08-09 VITALS — BP 119/66 | HR 75 | Resp 22 | Ht 62.0 in | Wt 185.5 lb

## 2017-08-09 DIAGNOSIS — Z7982 Long term (current) use of aspirin: Secondary | ICD-10-CM | POA: Insufficient documentation

## 2017-08-09 DIAGNOSIS — I509 Heart failure, unspecified: Secondary | ICD-10-CM | POA: Insufficient documentation

## 2017-08-09 DIAGNOSIS — I5022 Chronic systolic (congestive) heart failure: Secondary | ICD-10-CM

## 2017-08-09 DIAGNOSIS — I110 Hypertensive heart disease with heart failure: Secondary | ICD-10-CM | POA: Diagnosis not present

## 2017-08-09 DIAGNOSIS — I10 Essential (primary) hypertension: Secondary | ICD-10-CM

## 2017-08-09 DIAGNOSIS — G894 Chronic pain syndrome: Secondary | ICD-10-CM

## 2017-08-09 DIAGNOSIS — Z87891 Personal history of nicotine dependence: Secondary | ICD-10-CM | POA: Diagnosis not present

## 2017-08-09 DIAGNOSIS — M5136 Other intervertebral disc degeneration, lumbar region: Secondary | ICD-10-CM

## 2017-08-09 DIAGNOSIS — Z79899 Other long term (current) drug therapy: Secondary | ICD-10-CM | POA: Insufficient documentation

## 2017-08-09 DIAGNOSIS — J449 Chronic obstructive pulmonary disease, unspecified: Secondary | ICD-10-CM | POA: Insufficient documentation

## 2017-08-09 NOTE — Patient Instructions (Signed)
Continue weighing daily and call for an overnight weight gain of > 2 pounds or a weekly weight gain of >5 pounds. 

## 2017-08-09 NOTE — Telephone Encounter (Signed)
Patient mychart message.

## 2017-08-12 DIAGNOSIS — G894 Chronic pain syndrome: Secondary | ICD-10-CM | POA: Diagnosis not present

## 2017-08-12 DIAGNOSIS — J449 Chronic obstructive pulmonary disease, unspecified: Secondary | ICD-10-CM | POA: Diagnosis not present

## 2017-08-12 DIAGNOSIS — Z515 Encounter for palliative care: Secondary | ICD-10-CM | POA: Diagnosis not present

## 2017-08-16 ENCOUNTER — Other Ambulatory Visit: Payer: Self-pay | Admitting: Family Medicine

## 2017-08-16 ENCOUNTER — Encounter: Payer: Self-pay | Admitting: Family Medicine

## 2017-08-16 MED ORDER — PROMETHAZINE HCL 25 MG PO TABS: 12.5000 mg | ORAL_TABLET | Freq: Four times a day (QID) | ORAL | 2 refills | Status: AC | PRN

## 2017-08-16 NOTE — Telephone Encounter (Signed)
Patient mychart message.

## 2017-08-19 ENCOUNTER — Encounter: Payer: Self-pay | Admitting: Family Medicine

## 2017-08-20 ENCOUNTER — Ambulatory Visit (INDEPENDENT_AMBULATORY_CARE_PROVIDER_SITE_OTHER): Payer: Medicare HMO | Admitting: Family Medicine

## 2017-08-20 ENCOUNTER — Ambulatory Visit
Admission: RE | Admit: 2017-08-20 | Discharge: 2017-08-20 | Disposition: A | Discharge status: Home / Self Care | Payer: Medicare HMO | Source: Ambulatory Visit | Attending: Family Medicine | Admitting: Family Medicine

## 2017-08-20 ENCOUNTER — Encounter: Payer: Self-pay | Admitting: Family Medicine

## 2017-08-20 VITALS — BP 140/78 | HR 63 | Resp 16 | Ht 62.0 in | Wt 180.0 lb

## 2017-08-20 DIAGNOSIS — N183 Chronic kidney disease, stage 3 unspecified: Secondary | ICD-10-CM | POA: Insufficient documentation

## 2017-08-20 DIAGNOSIS — R0789 Other chest pain: Secondary | ICD-10-CM

## 2017-08-20 DIAGNOSIS — R0781 Pleurodynia: Secondary | ICD-10-CM | POA: Diagnosis not present

## 2017-08-20 DIAGNOSIS — I5022 Chronic systolic (congestive) heart failure: Secondary | ICD-10-CM | POA: Diagnosis not present

## 2017-08-20 DIAGNOSIS — J449 Chronic obstructive pulmonary disease, unspecified: Secondary | ICD-10-CM | POA: Diagnosis not present

## 2017-08-20 DIAGNOSIS — E538 Deficiency of other specified B group vitamins: Secondary | ICD-10-CM | POA: Diagnosis not present

## 2017-08-20 DIAGNOSIS — E559 Vitamin D deficiency, unspecified: Secondary | ICD-10-CM

## 2017-08-20 MED ORDER — PREDNISONE 20 MG PO TABS: 20.0000 mg | ORAL_TABLET | Freq: Every day | ORAL | 0 refills | Status: DC

## 2017-08-20 NOTE — Telephone Encounter (Signed)
Patient mychart message.

## 2017-08-20 NOTE — Progress Notes (Signed)
Date:  08/20/2017   Name:  [name] [name]   DOB:  Nov 04, 1945   MRN:  [id number]  PCP:  Schuyler Amor, MD    Chief Complaint: Chest Pain (Pain under right breast x 4 days) and Nail Problem (Nails are breaking at middle of each nail. )   History of Present Illness:  This is a 72 y.o. female seen same day for 4d hx constant pain below R breast, hurts to move or take deep breath, no known injury. Gaba increased to tid last visit, not sure helping chronic pain. Lasix decreased to 80 mg daily due to decreased GFR, no recurrent edema, breathing stable. B12/vit D levels normal last visit.  Review of Systems:  Review of Systems  Constitutional: Negative for chills and fever.  Respiratory: Negative for cough.   Cardiovascular: Negative for leg swelling.  Gastrointestinal: Negative for nausea.  Genitourinary: Negative for difficulty urinating.  Neurological: Negative for syncope and light-headedness.    Patient Active Problem List   Diagnosis Date Noted  . CKD (chronic kidney disease) stage 3, GFR 30-59 ml/min (HCC) 08/20/2017  . Insomnia 07/22/2017  . Hyperlipidemia 07/01/2017  . DDD (degenerative disc disease), lumbar 04/30/2017  . B12 deficiency 04/30/2017  . Gait instability 03/31/2017  . Allergic rhinitis 03/31/2017  . Nausea 03/31/2017  . GERD (gastroesophageal reflux disease) 03/31/2017  . Chronic systolic heart failure (HCC) 03/24/2017  . HTN (hypertension) 03/24/2017  . COPD (chronic obstructive pulmonary disease) (HCC) 03/24/2017  . Chronic pain 03/24/2017  . MRSA carrier 03/18/2017  . History of ventricular tachycardia 03/18/2017  . Admission for palliative care 03/10/2017  . Vitamin D deficiency 01/06/2017  . Chronic atrial fibrillation (HCC) 06/15/2016  . Depression 06/15/2016  . Tobacco use disorder 03/24/2016  . Varicose veins of lower extremity with inflammation 09/07/2011    Prior to Admission medications   Medication Sig Start Date End Date Taking? Authorizing  Provider  acetaminophen (TYLENOL) 500 MG tablet Take 2 tablets (1,000 mg total) by mouth 3 (three) times daily. 08/02/17  Yes Khushboo Chuck, Chrissie Noa, MD  albuterol (PROVENTIL HFA;VENTOLIN HFA) 108 (90 Base) MCG/ACT inhaler Inhale 2 puffs into the lungs every 6 (six) hours as needed for wheezing or shortness of breath. 04/12/17  Yes Jayvyn Haselton, Chrissie Noa, MD  aspirin 81 MG chewable tablet Chew 81 mg by mouth daily.   Yes [provider]  atorvastatin (LIPITOR) 40 MG tablet Take 1 tablet (40 mg total) by mouth daily. 04/26/17  Yes Hackney, Inetta Fermo A, FNP  budesonide-formoterol (SYMBICORT) 160-4.5 MCG/ACT inhaler Inhale 2 puffs into the lungs 2 (two) times daily. 07/07/17  Yes Shayma Pfefferle, Chrissie Noa, MD  carvedilol (COREG) 25 MG tablet Take 1 tablet (25 mg total) by mouth 2 (two) times daily. 07/26/17  Yes Michaela Broski, Chrissie Noa, MD  cetirizine (ZYRTEC) 10 MG tablet Take 10 mg by mouth. 07/24/17 07/24/18 Yes [provider]  Cholecalciferol (VITAMIN D3) 1000 units CAPS Take by mouth. 07/24/17 07/24/18 Yes [provider]  Cyanocobalamin (B-12) 1000 MCG TABS Take 1 tablet by mouth daily. 04/05/17  Yes Tayte Mcwherter, Chrissie Noa, MD  fluticasone (FLONASE) 50 MCG/ACT nasal spray Place 2 sprays into both nostrils daily.   Yes [provider]  furosemide (LASIX) 80 MG tablet Take 1 tablet (80 mg total) by mouth daily. 08/03/17  Yes Dreonna Hussein, Chrissie Noa, MD  gabapentin (NEURONTIN) 600 MG tablet Take 1 tablet (600 mg total) by mouth 3 (three) times daily. 08/02/17  Yes Damaris Geers, Chrissie Noa, MD  losartan (COZAAR) 25 MG tablet Take 25 mg by mouth. 07/24/17  Yes [provider]  mirtazapine (REMERON) 7.5 MG tablet Take 7.5 mg by mouth. 07/21/17  Yes [provider]  omeprazole (PRILOSEC) 40 MG capsule Take 40 mg by mouth. 07/24/17 07/24/18 Yes [provider]  OXYGEN Inhale 4 L into the lungs.    Yes [provider]  promethazine (PHENERGAN) 25 MG tablet Take 0.5 tablets (12.5 mg total) by mouth every 6  (six) hours as needed for nausea. 08/16/17  Yes Omar Orrego, Chrissie Noa, MD  spironolactone (ALDACTONE) 25 MG tablet Take 25 mg by mouth. 07/24/17 07/24/18 Yes [provider]  predniSONE (DELTASONE) 20 MG tablet Take 1 tablet (20 mg total) by mouth daily with breakfast. 08/20/17   Schuyler Amor, MD    Allergies  Allergen Reactions  . Sulfa Antibiotics Itching    Pt states she gets "itching inside and out"    Past Surgical History:  Procedure Laterality Date  . REPLACEMENT TOTAL KNEE    . TYMPANOSTOMY TUBE PLACEMENT Bilateral 03/19/2017    Social History   Tobacco Use  . Smoking status: Former Games developer  . Smokeless tobacco: Never Used  Substance Use Topics  . Alcohol use: No  . Drug use: No    Family History  Family history unknown: Yes    Medication list has been reviewed and updated.  Physical Examination: BP 140/78   Pulse 63   Resp 16   Ht 5\' 2"  (1.575 m)   Wt 180 lb (81.6 kg)   SpO2 100%   BMI 32.92 kg/m   Physical Exam  Constitutional: She appears well-developed and well-nourished. No distress.  Cardiovascular: Normal rate, regular rhythm and normal heart sounds.  Pulmonary/Chest: Effort normal and breath sounds normal.  Musculoskeletal: She exhibits no edema.  Tender over R anterior costal margin  Neurological: She is alert.  Skin: Skin is warm and dry.  Psychiatric: She has a normal mood and affect. Her behavior is normal.  Nursing note and vitals reviewed.   Assessment and Plan:  1. Right-sided chest wall pain Prednisone 20 mg daily x 5d (avoiding NSAIDS) - DG Ribs Unilateral Right; Future  2. Chronic systolic heart failure (HCC) Stable on current regimen, saw cards last week  3. Chronic obstructive pulmonary disease, unspecified COPD type (HCC) Stable on current regimen  4. CKD (chronic kidney disease) stage 3, GFR 30-59 ml/min (HCC) On decreased Lasix, consider recheck next visit  5. Vitamin D deficiency Well controlled on  supplement  6. B12 deficiency Well controlled on supplement  Return in about 10 days (around 08/30/2017).  Dionne Ano. Kingsley Spittle MD [facility]  08/20/2017

## 2017-08-23 ENCOUNTER — Telehealth: Payer: Self-pay

## 2017-08-23 NOTE — Telephone Encounter (Signed)
Daughter  called and left message that [name] [name] is still having pain. It is no better and feels the same as it was.

## 2017-08-24 NOTE — Telephone Encounter (Signed)
Sent mychart message

## 2017-08-24 NOTE — Telephone Encounter (Signed)
Does she have a rash (shingles) over the area? Did the prednisone not help at all?

## 2017-08-25 ENCOUNTER — Other Ambulatory Visit: Payer: Self-pay | Admitting: Family Medicine

## 2017-08-25 DIAGNOSIS — I5022 Chronic systolic (congestive) heart failure: Secondary | ICD-10-CM | POA: Diagnosis not present

## 2017-08-25 DIAGNOSIS — G894 Chronic pain syndrome: Secondary | ICD-10-CM

## 2017-08-25 MED ORDER — GABAPENTIN 600 MG PO TABS: 1200.0000 mg | ORAL_TABLET | Freq: Two times a day (BID) | ORAL | 0 refills | Status: DC

## 2017-08-26 DIAGNOSIS — I5022 Chronic systolic (congestive) heart failure: Secondary | ICD-10-CM | POA: Diagnosis not present

## 2017-08-31 ENCOUNTER — Encounter: Payer: Self-pay | Admitting: Family Medicine

## 2017-09-01 ENCOUNTER — Ambulatory Visit (INDEPENDENT_AMBULATORY_CARE_PROVIDER_SITE_OTHER): Payer: Medicare Other | Admitting: Family Medicine

## 2017-09-01 VITALS — BP 128/82 | HR 77 | Temp 98.4°F | Resp 15 | Ht 62.0 in | Wt 180.0 lb

## 2017-09-01 DIAGNOSIS — I10 Essential (primary) hypertension: Secondary | ICD-10-CM

## 2017-09-01 DIAGNOSIS — J441 Chronic obstructive pulmonary disease with (acute) exacerbation: Secondary | ICD-10-CM | POA: Diagnosis not present

## 2017-09-01 DIAGNOSIS — G894 Chronic pain syndrome: Secondary | ICD-10-CM | POA: Diagnosis not present

## 2017-09-01 DIAGNOSIS — N183 Chronic kidney disease, stage 3 unspecified: Secondary | ICD-10-CM

## 2017-09-01 DIAGNOSIS — I5022 Chronic systolic (congestive) heart failure: Secondary | ICD-10-CM

## 2017-09-01 LAB — POCT INFLUENZA A/B
INFLUENZA B, POC: NEGATIVE
Influenza A, POC: NEGATIVE

## 2017-09-01 MED ORDER — ALBUTEROL SULFATE (2.5 MG/3ML) 0.083% IN NEBU: 2.5000 mg | INHALATION_SOLUTION | Freq: Four times a day (QID) | RESPIRATORY_TRACT | 1 refills | Status: DC | PRN

## 2017-09-01 MED ORDER — PROMETHAZINE-CODEINE 6.25-10 MG/5ML PO SYRP: 5.0000 mL | ORAL_SOLUTION | Freq: Four times a day (QID) | ORAL | 0 refills | Status: DC | PRN

## 2017-09-01 MED ORDER — DOXYCYCLINE HYCLATE 100 MG PO TABS: 100.0000 mg | ORAL_TABLET | Freq: Two times a day (BID) | ORAL | 0 refills | Status: DC

## 2017-09-01 NOTE — Telephone Encounter (Signed)
Patient mychart message.

## 2017-09-01 NOTE — Progress Notes (Signed)
Date:  09/01/2017   Name:  [name] [name]   DOB:  1945-08-16   MRN:  [id number]  PCP:  Schuyler Amor, MD    Chief Complaint: Cough (wheezing needs refills on nebulizer solution )   History of Present Illness:  This is a 72 y.o. female seen same day for 2d hx cough prod green phlegm with wheezing, subjective fever, and myalgias. Needs refill albuterol neb soln. C/o sternal CP when coughs. Using Symbicort. Prednisone last visit did not seem to help much but increased appetite.  Review of Systems:  Review of Systems  Constitutional: Negative for chills.  HENT: Negative for sinus pain and trouble swallowing.   Cardiovascular: Negative for leg swelling.  Genitourinary: Negative for difficulty urinating.  Neurological: Negative for syncope and light-headedness.    Patient Active Problem List   Diagnosis Date Noted  . CKD (chronic kidney disease) stage 3, GFR 30-59 ml/min (HCC) 08/20/2017  . Insomnia 07/22/2017  . Hyperlipidemia 07/01/2017  . DDD (degenerative disc disease), lumbar 04/30/2017  . B12 deficiency 04/30/2017  . Gait instability 03/31/2017  . Allergic rhinitis 03/31/2017  . Nausea 03/31/2017  . GERD (gastroesophageal reflux disease) 03/31/2017  . Chronic systolic heart failure (HCC) 03/24/2017  . HTN (hypertension) 03/24/2017  . COPD (chronic obstructive pulmonary disease) (HCC) 03/24/2017  . Chronic pain 03/24/2017  . MRSA carrier 03/18/2017  . History of ventricular tachycardia 03/18/2017  . Admission for palliative care 03/10/2017  . Vitamin D deficiency 01/06/2017  . Chronic atrial fibrillation (HCC) 06/15/2016  . Depression 06/15/2016  . Varicose veins of lower extremity with inflammation 09/07/2011    Prior to Admission medications   Medication Sig Start Date End Date Taking? Authorizing Provider  acetaminophen (TYLENOL) 500 MG tablet Take 2 tablets (1,000 mg total) by mouth 3 (three) times daily. 08/02/17  Yes Beaux Verne, Chrissie Noa, MD  albuterol (PROVENTIL  HFA;VENTOLIN HFA) 108 (90 Base) MCG/ACT inhaler Inhale 2 puffs into the lungs every 6 (six) hours as needed for wheezing or shortness of breath. 04/12/17  Yes Syvanna Ciolino, Chrissie Noa, MD  aspirin 81 MG chewable tablet Chew 81 mg by mouth daily.   Yes [provider]  atorvastatin (LIPITOR) 40 MG tablet Take 1 tablet (40 mg total) by mouth daily. 04/26/17  Yes Hackney, Inetta Fermo A, FNP  budesonide-formoterol (SYMBICORT) 160-4.5 MCG/ACT inhaler Inhale 2 puffs into the lungs 2 (two) times daily. 07/07/17  Yes Marie Chow, Chrissie Noa, MD  carvedilol (COREG) 25 MG tablet Take 1 tablet (25 mg total) by mouth 2 (two) times daily. 07/26/17  Yes Asmaa Tirpak, Chrissie Noa, MD  cetirizine (ZYRTEC) 10 MG tablet Take 10 mg by mouth. 07/24/17 07/24/18 Yes [provider]  Cholecalciferol (VITAMIN D3) 1000 units CAPS Take by mouth. 07/24/17 07/24/18 Yes [provider]  Cyanocobalamin (B-12) 1000 MCG TABS Take 1 tablet by mouth daily. 04/05/17  Yes Symone Cornman, Chrissie Noa, MD  fluticasone (FLONASE) 50 MCG/ACT nasal spray Place 2 sprays into both nostrils daily.   Yes [provider]  furosemide (LASIX) 80 MG tablet Take 1 tablet (80 mg total) by mouth daily. 08/03/17  Yes Braden Cimo, Chrissie Noa, MD  gabapentin (NEURONTIN) 600 MG tablet Take 2 tablets (1,200 mg total) by mouth 2 (two) times daily. 08/25/17  Yes Kit Brubacher, Chrissie Noa, MD  ketoconazole (NIZORAL) 2 % shampoo U TO WASH SCALP 3 TIMES PER WEEK 08/30/17  Yes [provider]  losartan (COZAAR) 25 MG tablet Take 25 mg by mouth. 07/24/17  Yes [provider]  mirtazapine (REMERON) 7.5 MG tablet Take 7.5 mg by  mouth. 07/21/17  Yes [provider]  omeprazole (PRILOSEC) 40 MG capsule Take 40 mg by mouth. 07/24/17 07/24/18 Yes [provider]  OXYGEN Inhale 4 L into the lungs.    Yes [provider]  promethazine (PHENERGAN) 25 MG tablet Take 0.5 tablets (12.5 mg total) by mouth every 6 (six) hours as needed for nausea. 08/16/17  Yes Sylvia Kondracki,  Chrissie Noa, MD  spironolactone (ALDACTONE) 25 MG tablet Take 25 mg by mouth. 07/24/17 07/24/18 Yes [provider]  albuterol (PROVENTIL) (2.5 MG/3ML) 0.083% nebulizer solution Take 3 mLs (2.5 mg total) by nebulization every 6 (six) hours as needed for wheezing or shortness of breath. 09/01/17   Peggyann Zwiefelhofer, Chrissie Noa, MD  doxycycline (VIBRA-TABS) 100 MG tablet Take 1 tablet (100 mg total) by mouth 2 (two) times daily. 09/01/17   Graycen Degan, Chrissie Noa, MD  promethazine-codeine (PHENERGAN WITH CODEINE) 6.25-10 MG/5ML syrup Take 5 mLs by mouth every 6 (six) hours as needed for cough. 09/01/17   Schuyler Amor, MD    Allergies  Allergen Reactions  . Sulfa Antibiotics Itching    Pt states she gets "itching inside and out"    Past Surgical History:  Procedure Laterality Date  . REPLACEMENT TOTAL KNEE    . TYMPANOSTOMY TUBE PLACEMENT Bilateral 03/19/2017    Social History   Tobacco Use  . Smoking status: Former Games developer  . Smokeless tobacco: Never Used  Substance Use Topics  . Alcohol use: No  . Drug use: No    Family History  Family history unknown: Yes    Medication list has been reviewed and updated.  Physical Examination: BP 128/82   Pulse 77   Temp 98.4 F (36.9 C) (Oral)   Resp 15   Ht 5\' 2"  (1.575 m)   Wt 180 lb (81.6 kg) Comment: REPORTS  SpO2 100% Comment: 4 L O2  BMI 32.92 kg/m   Physical Exam  Constitutional: She appears well-developed and well-nourished.  Cardiovascular: Normal rate, regular rhythm and normal heart sounds.  Pulmonary/Chest: Effort normal.  Diffuse end expiratory wheezes  Musculoskeletal: She exhibits no edema.  Neurological: She is alert.  Skin: Skin is warm and dry.  Psychiatric: She has a normal mood and affect. Her behavior is normal.  Nursing note and vitals reviewed.   Assessment and Plan:  1. COPD with acute exacerbation (HCC) Rapid flu negative, doxy 100 mg bid x 10d, Phenergan with codeine q6h prn cough, refill albuterol neb solution, call  if sxs worsen/persist - POCT Influenza A/B  2. Chronic systolic heart failure (HCC) On decreased Lasix - Basic Metabolic Panel (BMET)  3. CKD (chronic kidney disease) stage 3, GFR 30-59 ml/min (HCC) Mild anemia in December - CBC with Differential  4. Essential hypertension Well controlled on current regimen  5. Chronic pain syndrome Cont gabapentin  Return in about 4 weeks (around 09/29/2017).  Dionne Ano. Kingsley Spittle MD [facility]  09/01/2017

## 2017-09-02 LAB — CBC WITH DIFFERENTIAL/PLATELET
BASOS ABS: 0 10*3/uL (ref 0.0–0.2)
Basos: 0 %
EOS (ABSOLUTE): 0.2 10*3/uL (ref 0.0–0.4)
Eos: 2 %
Hematocrit: 36.4 % (ref 34.0–46.6)
Hemoglobin: 11.8 g/dL (ref 11.1–15.9)
Immature Grans (Abs): 0 10*3/uL (ref 0.0–0.1)
Immature Granulocytes: 0 %
LYMPHS ABS: 2 10*3/uL (ref 0.7–3.1)
Lymphs: 26 %
MCH: 28.1 pg (ref 26.6–33.0)
MCHC: 32.4 g/dL (ref 31.5–35.7)
MCV: 87 fL (ref 79–97)
MONOS ABS: 0.7 10*3/uL (ref 0.1–0.9)
Monocytes: 9 %
Neutrophils Absolute: 4.9 10*3/uL (ref 1.4–7.0)
Neutrophils: 63 %
PLATELETS: 155 10*3/uL (ref 150–379)
RBC: 4.2 x10E6/uL (ref 3.77–5.28)
RDW: 15.5 % — ABNORMAL HIGH (ref 12.3–15.4)
WBC: 7.8 10*3/uL (ref 3.4–10.8)

## 2017-09-02 LAB — BASIC METABOLIC PANEL
BUN / CREAT RATIO: 16 (ref 12–28)
BUN: 21 mg/dL (ref 8–27)
CALCIUM: 9.1 mg/dL (ref 8.7–10.3)
CHLORIDE: 102 mmol/L (ref 96–106)
CO2: 28 mmol/L (ref 20–29)
Creatinine, Ser: 1.3 mg/dL — ABNORMAL HIGH (ref 0.57–1.00)
GFR calc non Af Amer: 41 mL/min/{1.73_m2} — ABNORMAL LOW (ref >59)
GFR, EST AFRICAN AMERICAN: 47 mL/min/{1.73_m2} — AB (ref >59)
Glucose: 98 mg/dL (ref 65–99)
POTASSIUM: 4.2 mmol/L (ref 3.5–5.2)
SODIUM: 145 mmol/L — AB (ref 134–144)

## 2017-09-06 ENCOUNTER — Ambulatory Visit: Payer: Medicare Other | Admitting: Family Medicine

## 2017-09-07 ENCOUNTER — Encounter: Payer: Self-pay | Admitting: Family Medicine

## 2017-09-07 NOTE — Telephone Encounter (Signed)
Patient mychart message.

## 2017-09-08 ENCOUNTER — Other Ambulatory Visit: Payer: Self-pay | Admitting: Family Medicine

## 2017-09-08 MED ORDER — PROMETHAZINE-CODEINE 6.25-10 MG/5ML PO SYRP: 5.0000 mL | ORAL_SOLUTION | Freq: Four times a day (QID) | ORAL | 0 refills | Status: DC | PRN

## 2017-09-18 ENCOUNTER — Emergency Department
Admission: EM | Admit: 2017-09-18 | Discharge: 2017-09-18 | Disposition: A | Discharge status: Home / Self Care | Payer: Medicare Other | Attending: Emergency Medicine | Admitting: Emergency Medicine

## 2017-09-18 ENCOUNTER — Encounter: Payer: Self-pay | Admitting: Emergency Medicine

## 2017-09-18 ENCOUNTER — Emergency Department: Payer: Medicare Other

## 2017-09-18 ENCOUNTER — Other Ambulatory Visit: Payer: Self-pay

## 2017-09-18 DIAGNOSIS — N183 Chronic kidney disease, stage 3 (moderate): Secondary | ICD-10-CM | POA: Diagnosis not present

## 2017-09-18 DIAGNOSIS — R0600 Dyspnea, unspecified: Secondary | ICD-10-CM

## 2017-09-18 DIAGNOSIS — Z7982 Long term (current) use of aspirin: Secondary | ICD-10-CM | POA: Diagnosis not present

## 2017-09-18 DIAGNOSIS — Z79899 Other long term (current) drug therapy: Secondary | ICD-10-CM | POA: Diagnosis not present

## 2017-09-18 DIAGNOSIS — Z87891 Personal history of nicotine dependence: Secondary | ICD-10-CM | POA: Diagnosis not present

## 2017-09-18 DIAGNOSIS — I5042 Chronic combined systolic (congestive) and diastolic (congestive) heart failure: Secondary | ICD-10-CM | POA: Insufficient documentation

## 2017-09-18 DIAGNOSIS — Z96659 Presence of unspecified artificial knee joint: Secondary | ICD-10-CM | POA: Diagnosis not present

## 2017-09-18 DIAGNOSIS — I130 Hypertensive heart and chronic kidney disease with heart failure and stage 1 through stage 4 chronic kidney disease, or unspecified chronic kidney disease: Secondary | ICD-10-CM | POA: Diagnosis not present

## 2017-09-18 DIAGNOSIS — J441 Chronic obstructive pulmonary disease with (acute) exacerbation: Secondary | ICD-10-CM

## 2017-09-18 DIAGNOSIS — R0602 Shortness of breath: Secondary | ICD-10-CM | POA: Diagnosis present

## 2017-09-18 LAB — BRAIN NATRIURETIC PEPTIDE: B NATRIURETIC PEPTIDE 5: 92 pg/mL (ref 0.0–100.0)

## 2017-09-18 LAB — BASIC METABOLIC PANEL
ANION GAP: 10 (ref 5–15)
BUN: 25 mg/dL — ABNORMAL HIGH (ref 6–20)
CALCIUM: 8.9 mg/dL (ref 8.9–10.3)
CO2: 28 mmol/L (ref 22–32)
Chloride: 102 mmol/L (ref 101–111)
Creatinine, Ser: 1.23 mg/dL — ABNORMAL HIGH (ref 0.44–1.00)
GFR calc non Af Amer: 42 mL/min — ABNORMAL LOW (ref >60)
GFR, EST AFRICAN AMERICAN: 49 mL/min — AB (ref >60)
GLUCOSE: 100 mg/dL — AB (ref 65–99)
POTASSIUM: 4.2 mmol/L (ref 3.5–5.1)
Sodium: 140 mmol/L (ref 135–145)

## 2017-09-18 LAB — FIBRIN DERIVATIVES D-DIMER (ARMC ONLY): FIBRIN DERIVATIVES D-DIMER (ARMC): 792.54 ng{FEU}/mL — AB (ref 0.00–499.00)

## 2017-09-18 LAB — CBC
HEMATOCRIT: 38 % (ref 35.0–47.0)
HEMOGLOBIN: 12.3 g/dL (ref 12.0–16.0)
MCH: 27.1 pg (ref 26.0–34.0)
MCHC: 32.3 g/dL (ref 32.0–36.0)
MCV: 83.9 fL (ref 80.0–100.0)
Platelets: 146 10*3/uL — ABNORMAL LOW (ref 150–440)
RBC: 4.54 MIL/uL (ref 3.80–5.20)
RDW: 16.4 % — ABNORMAL HIGH (ref 11.5–14.5)
WBC: 5.2 10*3/uL (ref 3.6–11.0)

## 2017-09-18 LAB — INFLUENZA PANEL BY PCR (TYPE A & B)
INFLBPCR: NEGATIVE
Influenza A By PCR: NEGATIVE

## 2017-09-18 LAB — TROPONIN I

## 2017-09-18 MED ORDER — PREDNISONE 20 MG PO TABS: 40.0000 mg | ORAL_TABLET | Freq: Every day | ORAL | 0 refills | Status: DC

## 2017-09-18 MED ORDER — PROMETHAZINE HCL 25 MG PO TABS
25.0000 mg | ORAL_TABLET | Freq: Once | ORAL | Status: AC
  Administered 2017-09-18: 25 mg via ORAL
  Filled 2017-09-18: qty 1

## 2017-09-18 MED ORDER — METHYLPREDNISOLONE SODIUM SUCC 125 MG IJ SOLR
125.0000 mg | INTRAMUSCULAR | Status: AC
  Administered 2017-09-18: 125 mg via INTRAVENOUS
  Filled 2017-09-18: qty 2

## 2017-09-18 MED ORDER — AZITHROMYCIN 500 MG PO TABS
500.0000 mg | ORAL_TABLET | Freq: Once | ORAL | Status: AC
  Administered 2017-09-18: 500 mg via ORAL
  Filled 2017-09-18: qty 1

## 2017-09-18 MED ORDER — ALBUTEROL SULFATE (2.5 MG/3ML) 0.083% IN NEBU
INHALATION_SOLUTION | RESPIRATORY_TRACT | Status: AC
  Filled 2017-09-18: qty 6

## 2017-09-18 MED ORDER — IPRATROPIUM-ALBUTEROL 0.5-2.5 (3) MG/3ML IN SOLN
3.0000 mL | Freq: Once | RESPIRATORY_TRACT | Status: AC
  Administered 2017-09-18: 3 mL via RESPIRATORY_TRACT
  Filled 2017-09-18: qty 3

## 2017-09-18 MED ORDER — IOPAMIDOL (ISOVUE-370) INJECTION 76%
60.0000 mL | Freq: Once | INTRAVENOUS | Status: AC | PRN
  Administered 2017-09-18: 60 mL via INTRAVENOUS

## 2017-09-18 MED ORDER — AZITHROMYCIN 250 MG PO TABS: ORAL_TABLET | ORAL | 0 refills | Status: DC

## 2017-09-18 MED ORDER — ALBUTEROL SULFATE (2.5 MG/3ML) 0.083% IN NEBU
5.0000 mg | INHALATION_SOLUTION | Freq: Once | RESPIRATORY_TRACT | Status: AC
  Administered 2017-09-18: 5 mg via RESPIRATORY_TRACT

## 2017-09-18 NOTE — ED Notes (Signed)
Pt returned from CT and c/o feeling nauseated, she requests phenergan stating she takes it at home

## 2017-09-18 NOTE — ED Notes (Signed)
FN: pt from home with increased shortness of breath. Pt on home oxygen.

## 2017-09-18 NOTE — ED Notes (Signed)
Pt was able to get out of bed and walk about in the room on oxygen, sat remained 99-100%, she continues to feel weak but states no big change in how she is normally able to ambulate at home with her oxygen on.

## 2017-09-18 NOTE — ED Provider Notes (Signed)
[facility] Emergency Department Provider Note ____________________________________________   First MD Initiated Contact with Patient 09/18/17 1206     (approximate)  I have reviewed the triage vital signs and the nursing notes.   HISTORY  Chief Complaint Shortness of Breath    HPI [name] [name] is a 72 y.o. female notable history of congestive heart failure, EF 25%, COPD.  Patient reports she is on 4 L nasal cannula all the time at home.  She lives with her daughter-in-law who also presents with her today.  Patient reports yesterday she began experiencing a nonproductive cough, feeling of slight tightening across her chest, and increased shortness of breath.  She reports that oftentimes she waits to call EMS and rescue to the point that she is "almost unconscious".  However, she presented today for evaluation as she is not want her disease to progress.  She has not noticed any notable swelling in legs or weight gain, except for about 2 weeks ago she was on prednisone for a flareup of her "bronchitis" that caused her to gain some weight.  However she reports today nonproductive cough wheezing and increased shortness of breath.  She also reports fatigue feeling in all of her extremities.  Reports a tight feeling across her chest is been present for about a day now.  Reports similar in the past with flares of her COPD.  Past Medical History:  Diagnosis Date  . Chronic combined systolic (congestive) and diastolic (congestive) heart failure (HCC)    a. 02/2017 Echo: EF 25-30%, Gr2 DD.  Marland Kitchen COPD (chronic obstructive pulmonary disease) (HCC)    a. On supplemental O2 @ home.  Marland Kitchen Hypertension   . Lung nodule   . Narrow complex tachycardia (HCC)    a. 02/2017 - ? NSVT vs Afib-->placed on amio.  No OAC.  Marland Kitchen NICM (nonischemic cardiomyopathy) (HCC)    a. 02/2017 Echo: Ef 25-30%, mild conc LVH, Gr2 DD, sev MR, mod dil LA;  b. 02/2016 Cath [facility]): nonobs dzs.  .  Non-obstructive CAD    a. 02/2016 Cath [facility]): LM 30, LAD 20ost/p, 66m, 30d, D1 50p, D2 30, D3 20, RI 20, LCX 40p, OM1 30, OM2 20, RCA 20 diffuse, RPL2 30, RPL3 30.  Marland Kitchen Severe mitral regurgitation    a. 02/2017 Echo: Sev MR.    Patient Active Problem List   Diagnosis Date Noted  . CKD (chronic kidney disease) stage 3, GFR 30-59 ml/min (HCC) 08/20/2017  . Insomnia 07/22/2017  . Hyperlipidemia 07/01/2017  . DDD (degenerative disc disease), lumbar 04/30/2017  . B12 deficiency 04/30/2017  . Gait instability 03/31/2017  . Allergic rhinitis 03/31/2017  . Nausea 03/31/2017  . GERD (gastroesophageal reflux disease) 03/31/2017  . Chronic systolic heart failure (HCC) 03/24/2017  . HTN (hypertension) 03/24/2017  . COPD (chronic obstructive pulmonary disease) (HCC) 03/24/2017  . Chronic pain 03/24/2017  . MRSA carrier 03/18/2017  . History of ventricular tachycardia 03/18/2017  . Admission for palliative care 03/10/2017  . Vitamin D deficiency 01/06/2017  . Chronic atrial fibrillation (HCC) 06/15/2016  . Depression 06/15/2016  . Varicose veins of lower extremity with inflammation 09/07/2011    Past Surgical History:  Procedure Laterality Date  . REPLACEMENT TOTAL KNEE    . TYMPANOSTOMY TUBE PLACEMENT Bilateral 03/19/2017    Prior to Admission medications   Medication Sig Start Date End Date Taking? Authorizing Provider  acetaminophen (TYLENOL) 500 MG tablet Take 2 tablets (1,000 mg total) by mouth 3 (three) times daily. 08/02/17  Plonk, Chrissie Noa, MD  albuterol (PROVENTIL HFA;VENTOLIN HFA) 108 (90 Base) MCG/ACT inhaler Inhale 2 puffs into the lungs every 6 (six) hours as needed for wheezing or shortness of breath. 04/12/17   Plonk, Chrissie Noa, MD  albuterol (PROVENTIL) (2.5 MG/3ML) 0.083% nebulizer solution Take 3 mLs (2.5 mg total) by nebulization every 6 (six) hours as needed for wheezing or shortness of breath. 09/01/17   Plonk, Chrissie Noa, MD  aspirin 81 MG chewable tablet Chew 81 mg by mouth daily.     [provider]  atorvastatin (LIPITOR) 40 MG tablet Take 1 tablet (40 mg total) by mouth daily. 04/26/17   Delma Freeze, FNP  azithromycin (ZITHROMAX) 250 MG tablet 1 tab daily by mouth 09/18/17   Sharyn Creamer, MD  budesonide-formoterol [facility]) 160-4.5 MCG/ACT inhaler Inhale 2 puffs into the lungs 2 (two) times daily. 07/07/17   Plonk, Chrissie Noa, MD  carvedilol (COREG) 25 MG tablet Take 1 tablet (25 mg total) by mouth 2 (two) times daily. 07/26/17   Plonk, Chrissie Noa, MD  cetirizine (ZYRTEC) 10 MG tablet Take 10 mg by mouth. 07/24/17 07/24/18  [provider]  Cholecalciferol (VITAMIN D3) 1000 units CAPS Take by mouth. 07/24/17 07/24/18  [provider]  Cyanocobalamin (B-12) 1000 MCG TABS Take 1 tablet by mouth daily. 04/05/17   Plonk, Chrissie Noa, MD  doxycycline (VIBRA-TABS) 100 MG tablet Take 1 tablet (100 mg total) by mouth 2 (two) times daily. 09/01/17   Plonk, Chrissie Noa, MD  fluticasone (FLONASE) 50 MCG/ACT nasal spray Place 2 sprays into both nostrils daily.    [provider]  furosemide (LASIX) 80 MG tablet Take 1 tablet (80 mg total) by mouth daily. 08/03/17   Plonk, Chrissie Noa, MD  gabapentin (NEURONTIN) 600 MG tablet Take 2 tablets (1,200 mg total) by mouth 2 (two) times daily. 08/25/17   Plonk, Chrissie Noa, MD  ketoconazole (NIZORAL) 2 % shampoo U TO [facility] SCALP 3 TIMES PER WEEK 08/30/17   [provider]  losartan (COZAAR) 25 MG tablet Take 25 mg by mouth. 07/24/17   [provider]  mirtazapine (REMERON) 7.5 MG tablet Take 7.5 mg by mouth. 07/21/17   [provider]  omeprazole (PRILOSEC) 40 MG capsule Take 40 mg by mouth. 07/24/17 07/24/18  [provider]  OXYGEN Inhale 4 L into the lungs.     [provider]  predniSONE (DELTASONE) 20 MG tablet Take 2 tablets (40 mg total) by mouth daily with breakfast. 09/18/17   Sharyn Creamer, MD  promethazine (PHENERGAN) 25 MG tablet Take 0.5 tablets (12.5 mg total) by mouth every 6  (six) hours as needed for nausea. 08/16/17   Plonk, Chrissie Noa, MD  promethazine-codeine (PHENERGAN WITH CODEINE) 6.25-10 MG/5ML syrup Take 5 mLs by mouth every 6 (six) hours as needed for cough. 09/08/17   Plonk, Chrissie Noa, MD  spironolactone (ALDACTONE) 25 MG tablet Take 25 mg by mouth. 07/24/17 07/24/18  [provider]    Allergies Sulfa antibiotics  Family History  Family history unknown: Yes    Social History Social History   Tobacco Use  . Smoking status: Former Games developer  . Smokeless tobacco: Never Used  Substance Use Topics  . Alcohol use: No  . Drug use: No    Review of Systems Constitutional: No fever/chills reports increased fatigue Eyes: No visual changes. ENT: No sore throat. Cardiovascular: Denies chest pain but does report some feeling of tightness. Respiratory: See HPI Gastrointestinal: No abdominal pain.  No nausea, no vomiting.  No diarrhea.  No constipation. Genitourinary: Negative for  dysuria. Musculoskeletal: Negative for back pain. Skin: Negative for rash. Neurological: Negative for headaches, focal weakness or numbness.    ____________________________________________   PHYSICAL EXAM:  VITAL SIGNS: ED Triage Vitals  Enc Vitals Group     BP 09/18/17 1137 (!) 128/93     Pulse Rate 09/18/17 1137 89     Resp 09/18/17 1137 16     Temp 09/18/17 1137 (!) 97.3 F (36.3 C)     Temp Source 09/18/17 1137 Oral     SpO2 09/18/17 1137 100 %     Weight 09/18/17 1138 180 lb (81.6 kg)     Height 09/18/17 1138 5\' 2"  (1.575 m)     Head Circumference --      Peak Flow --      Pain Score 09/18/17 1145 10     Pain Loc --      Pain Edu? --      Excl. in GC? --     Constitutional: Alert and oriented.  Barrel chested in appearance, appears chronically ill.  In no acute extremities.  Normal oxygen saturation on her home 4 L nasal cannula. Eyes: Conjunctivae are normal. Head: Atraumatic. Nose: No congestion/rhinnorhea. Mouth/Throat: Mucous membranes are  moist. Neck: No stridor.   Cardiovascular: Normal rate, regular rhythm. Grossly normal heart sounds.  Good peripheral circulation. Respiratory: Normal respiratory effort.  No retractions.  Mild end expiratory wheezing noted throughout all lung fields.  No focal rales noted.  Speaks in phrases, but reports just a slight increase in her baseline shortness of breath. Gastrointestinal: Soft and nontender. No distention. Musculoskeletal: No lower extremity tenderness nor edema. Neurologic:  Normal speech and language. No gross focal neurologic deficits are appreciated.  Skin:  Skin is warm, dry and intact. No rash noted. Psychiatric: Mood and affect are normal. Speech and behavior are normal.  ____________________________________________   LABS (all labs ordered are listed, but only abnormal results are displayed)  Labs Reviewed  BASIC METABOLIC PANEL - Abnormal; Notable for the following components:      Result Value   Glucose, Bld 100 (*)    BUN 25 (*)    Creatinine, Ser 1.23 (*)    GFR calc non Af Amer 42 (*)    GFR calc Af Amer 49 (*)    All other components within normal limits  CBC - Abnormal; Notable for the following components:   RDW 16.4 (*)    Platelets 146 (*)    All other components within normal limits  FIBRIN DERIVATIVES D-DIMER ([facility] ONLY) - Abnormal; Notable for the following components:   Fibrin derivatives D-dimer ([facility]) 792.54 (*)    All other components within normal limits  TROPONIN I  BRAIN NATRIURETIC PEPTIDE  INFLUENZA PANEL BY PCR (TYPE A & B)   ____________________________________________  EKG  Reviewed and entered by 11:42 AM Ventricular rate 85 QRS 90 QTC 430 Normal sinus rhythm, occasional PVC, there is no appreciable ischemic T wave abnormalities except for a slight depression in aVL.  No appreciable abnormality compared to previous EKG in October 2018, perhaps even some slight improvement in T waves  lateral ____________________________________________  RADIOLOGY  Dg Chest 2 View  Result Date: 09/18/2017 CLINICAL DATA:  Shortness of breath. EXAM: CHEST  2 VIEW COMPARISON:  Chest x-ray dated July 15, 2017. FINDINGS: Stable mild cardiomegaly. Increased interstitial markings when compared to prior study. Emphysematous changes again noted. No focal consolidation, pleural effusion, or pneumothorax. No acute osseous abnormality. IMPRESSION: 1. Increased interstitial markings when compared to  prior study could reflect mild interstitial edema or pneumonitis. Electronically Signed   By: Obie Dredge M.D.   On: 09/18/2017 12:22    Chest x-ray reviewed, interstitial markings are increased.  Possible interstitial edema or pneumonitis.  Reviewed by me. ____________________________________________   PROCEDURES  Procedure(s) performed: None  Procedures  Critical Care performed: No  ____________________________________________   INITIAL IMPRESSION / ASSESSMENT AND PLAN / ED COURSE  Pertinent labs & imaging results that were available during my care of the patient were reviewed by me and considered in my medical decision making (see chart for details).  Patient presents for evaluation of dyspnea.  She appears relatively well on examination but does have end expiratory wheezing.  No hypoxia.  No tachypnea.  Reassuring evaluation overall, but the patient is a complex history of congestive heart failure, COPD, A. fib, multiple other comorbidities.  Suspect likely bronchitis with a mild COPD exacerbation, likely viral etiology.  Afebrile normal white count.  Chest x-ray showing slight interstitial markings which infiltrates, relatively nonspecific but may be indicative of possible viral illness and given her appearance without edema, her normal BNP fever this is not secondary to volume overload.  Clinical Course as of Sep 18 1524  Sat Sep 18, 2017  1419 Reevaluation, patient resting  comfortably.  Lungs are now clear.  Normal oxygen saturation on home oxygen.  She reports feeling much better.  Clinical context, I do not see evidence of congestive heart failure, suspect likely recurrence of COPD/bronchitis.  We will treat her with azithromycin as she has not been treated with this for several months, and I do not suspect high likelihood for pneumonia.  In addition, I will send a d-dimer to exclude pulmonary embolism.  She has no history of pulmonary embolism, blood clots, but given her presentation with dyspnea today and being relatively bedbound we will screen for this using d-dimer.  Patient and family at the bedside comfortable with this plan.  [MQ]    Clinical Course User Index [MQ] Sharyn Creamer, MD   ----------------------------------------- 3:25 PM on 09/18/2017 -----------------------------------------  Patient reporting much improvement.  Asking to be able to be discharged soon, currently eating a meal.  Lungs are clear, alert oriented appears well in no distress.  D-dimer is, however elevated, screen for low pretest probability of pulmonary embolism given her dyspnea.  Discussed with patient and family will proceed with CT angiogram to rule out PE.  This is negative, plan will be for Dr. Mayford Knife to reevaluate her in likely discharged home if she remains well with prescription for prednisone and azithromycin.  Patient and family report having good supply of nebulizer medications and will follow-up closely with Dr. Hollace Hayward.  Return precautions and treatment recommendations and follow-up discussed with the patient who is agreeable with the plan.   ____________________________________________   FINAL CLINICAL IMPRESSION(S) / ED DIAGNOSES  Final diagnoses:  Dyspnea, unspecified type  COPD exacerbation (HCC)      NEW MEDICATIONS STARTED DURING THIS VISIT:  New Prescriptions   AZITHROMYCIN (ZITHROMAX) 250 MG TABLET    1 tab daily by mouth   PREDNISONE (DELTASONE) 20  MG TABLET    Take 2 tablets (40 mg total) by mouth daily with breakfast.     Note:  This document was prepared using Dragon voice recognition software and may include unintentional dictation errors.     Sharyn Creamer, MD 09/18/17 [phone]

## 2017-09-18 NOTE — ED Triage Notes (Signed)
Pt presents to ED via POV c/o weakness and shortness of breath overnight, worse today. On 4L O2 chronically, sat 100% at this time. Cough present, states unable to cough anything up.

## 2017-09-18 NOTE — Discharge Instructions (Signed)
We believe that your symptoms are caused today by an exacerbation of your COPD, and possibly bronchitis.  Please take the prescribed medications and any medications that you have at home for your COPD.  Follow up with your doctor as recommended.  If you develop any new or worsening symptoms, including but not limited to fever, persistent vomiting, worsening shortness of breath, or other symptoms that concern you, please return to the Emergency Department immediately. ° °

## 2017-09-18 NOTE — ED Notes (Signed)
To x-ray via stgretcher.

## 2017-09-22 ENCOUNTER — Encounter: Payer: Self-pay | Admitting: Family Medicine

## 2017-09-22 NOTE — Telephone Encounter (Signed)
Patient mychart message.

## 2017-09-27 ENCOUNTER — Encounter: Payer: Self-pay | Admitting: Family Medicine

## 2017-09-27 ENCOUNTER — Other Ambulatory Visit: Payer: Self-pay | Admitting: Family Medicine

## 2017-09-27 DIAGNOSIS — G894 Chronic pain syndrome: Secondary | ICD-10-CM

## 2017-09-27 MED ORDER — GABAPENTIN 600 MG PO TABS: 1200.0000 mg | ORAL_TABLET | Freq: Two times a day (BID) | ORAL | 2 refills | Status: DC

## 2017-09-27 MED ORDER — SPIRONOLACTONE 25 MG PO TABS: 25.0000 mg | ORAL_TABLET | Freq: Every day | ORAL | 3 refills | Status: DC

## 2017-09-27 MED ORDER — MIRTAZAPINE 15 MG PO TABS: 15.0000 mg | ORAL_TABLET | Freq: Every day | ORAL | 2 refills | Status: DC

## 2017-09-27 NOTE — Telephone Encounter (Signed)
Patient mychart message.

## 2017-09-27 NOTE — Telephone Encounter (Signed)
Patient message

## 2017-09-28 ENCOUNTER — Telehealth: Payer: Self-pay

## 2017-09-28 NOTE — Telephone Encounter (Signed)
Pt is scheduled for f/u with Dr. Hollace Hayward on 09/30/17 @ 2:45. According to Medicare, pt is due for an AWV - initial. Called pt to schedule AWV @ 3:00pm w/ [facility] on 09/30/17. Note, this visit will follow Plonk's appt. LVM informing pt of this information. Advised she call the office if she had any questions or concerns.

## 2017-09-30 ENCOUNTER — Encounter: Payer: Self-pay | Admitting: Family Medicine

## 2017-09-30 ENCOUNTER — Ambulatory Visit (INDEPENDENT_AMBULATORY_CARE_PROVIDER_SITE_OTHER): Payer: Medicare Other

## 2017-09-30 ENCOUNTER — Ambulatory Visit (INDEPENDENT_AMBULATORY_CARE_PROVIDER_SITE_OTHER): Payer: Medicare Other | Admitting: Family Medicine

## 2017-09-30 VITALS — BP 128/84 | HR 78 | Temp 97.5°F | Resp 20 | Ht 62.0 in | Wt 180.0 lb

## 2017-09-30 VITALS — BP 128/84 | HR 78 | Resp 16 | Ht 62.0 in | Wt 180.0 lb

## 2017-09-30 DIAGNOSIS — J449 Chronic obstructive pulmonary disease, unspecified: Secondary | ICD-10-CM

## 2017-09-30 DIAGNOSIS — G894 Chronic pain syndrome: Secondary | ICD-10-CM

## 2017-09-30 DIAGNOSIS — I5022 Chronic systolic (congestive) heart failure: Secondary | ICD-10-CM

## 2017-09-30 DIAGNOSIS — E538 Deficiency of other specified B group vitamins: Secondary | ICD-10-CM

## 2017-09-30 DIAGNOSIS — G4700 Insomnia, unspecified: Secondary | ICD-10-CM

## 2017-09-30 DIAGNOSIS — J309 Allergic rhinitis, unspecified: Secondary | ICD-10-CM | POA: Diagnosis not present

## 2017-09-30 DIAGNOSIS — Z0000 Encounter for general adult medical examination without abnormal findings: Secondary | ICD-10-CM | POA: Diagnosis not present

## 2017-09-30 DIAGNOSIS — K219 Gastro-esophageal reflux disease without esophagitis: Secondary | ICD-10-CM

## 2017-09-30 DIAGNOSIS — E785 Hyperlipidemia, unspecified: Secondary | ICD-10-CM

## 2017-09-30 DIAGNOSIS — I10 Essential (primary) hypertension: Secondary | ICD-10-CM

## 2017-09-30 DIAGNOSIS — N183 Chronic kidney disease, stage 3 unspecified: Secondary | ICD-10-CM

## 2017-09-30 DIAGNOSIS — E559 Vitamin D deficiency, unspecified: Secondary | ICD-10-CM

## 2017-09-30 MED ORDER — TRAMADOL HCL 50 MG PO TABS: 50.0000 mg | ORAL_TABLET | Freq: Two times a day (BID) | ORAL | 0 refills | Status: DC

## 2017-09-30 NOTE — Patient Instructions (Signed)
Begin tramadol twice daily, should help with pain and cough.

## 2017-09-30 NOTE — Progress Notes (Signed)
Date:  09/30/2017   Name:  [name] [name]   DOB:  01/01/1946   MRN:  [id number]  PCP:  Schuyler Amor, MD    Chief Complaint: COPD (Seen in ER 2 weeks ago has already done steroids and 2 rounds of Abx but is still tight and coughing bad. Can not sleep due to cough. )   History of Present Illness:  This is a 72 y.o. female seen for one month f/u. Placed on doxy for bronchitis last visit, seen ED two weeks ago, placed on azith and prednisone, felt better initially but now worse, cough unimproved. Occ BLE edema. Sleeping better on Remeron. C/o hurting all over, difficult to move. Wants to stay out of hospital, has pain clinic appt in two weeks.  Review of Systems:  Review of Systems  Constitutional: Negative for chills and fever.  Cardiovascular: Negative for chest pain.  Genitourinary: Negative for difficulty urinating.  Neurological: Negative for syncope and light-headedness.    Patient Active Problem List   Diagnosis Date Noted  . CKD (chronic kidney disease) stage 3, GFR 30-59 ml/min (HCC) 08/20/2017  . Insomnia 07/22/2017  . Hyperlipidemia 07/01/2017  . DDD (degenerative disc disease), lumbar 04/30/2017  . B12 deficiency 04/30/2017  . Gait instability 03/31/2017  . Allergic rhinitis 03/31/2017  . Nausea 03/31/2017  . GERD (gastroesophageal reflux disease) 03/31/2017  . Chronic systolic heart failure (HCC) 03/24/2017  . HTN (hypertension) 03/24/2017  . COPD (chronic obstructive pulmonary disease) (HCC) 03/24/2017  . Chronic pain 03/24/2017  . MRSA carrier 03/18/2017  . History of ventricular tachycardia 03/18/2017  . Admission for palliative care 03/10/2017  . Vitamin D deficiency 01/06/2017  . Chronic atrial fibrillation (HCC) 06/15/2016  . Depression 06/15/2016  . Tobacco use disorder 03/24/2016  . Varicose veins of lower extremity with inflammation 09/07/2011    Prior to Admission medications   Medication Sig Start Date End Date Taking? Authorizing Provider   acetaminophen (TYLENOL) 500 MG tablet Take 2 tablets (1,000 mg total) by mouth 3 (three) times daily. 08/02/17  Yes Latrese Carolan, Chrissie Noa, MD  albuterol (PROVENTIL HFA;VENTOLIN HFA) 108 (90 Base) MCG/ACT inhaler Inhale 2 puffs into the lungs every 6 (six) hours as needed for wheezing or shortness of breath. 04/12/17  Yes Katianna Mcclenney, Chrissie Noa, MD  albuterol (PROVENTIL) (2.5 MG/3ML) 0.083% nebulizer solution Take 3 mLs (2.5 mg total) by nebulization every 6 (six) hours as needed for wheezing or shortness of breath. 09/01/17  Yes Kashius Dominic, Chrissie Noa, MD  aspirin 81 MG chewable tablet Chew 81 mg by mouth daily.   Yes [provider]  atorvastatin (LIPITOR) 40 MG tablet Take 1 tablet (40 mg total) by mouth daily. 04/26/17  Yes Hackney, Inetta Fermo A, FNP  budesonide-formoterol (SYMBICORT) 160-4.5 MCG/ACT inhaler Inhale 2 puffs into the lungs 2 (two) times daily. 07/07/17  Yes Gabrelle Roca, Chrissie Noa, MD  carvedilol (COREG) 25 MG tablet Take 1 tablet (25 mg total) by mouth 2 (two) times daily. 07/26/17  Yes Ory Elting, Chrissie Noa, MD  Cholecalciferol (VITAMIN D3) 1000 units CAPS Take 1 capsule by mouth daily.  07/24/17 07/24/18 Yes [provider]  Cyanocobalamin (B-12) 1000 MCG TABS Take 1 tablet by mouth daily. 04/05/17  Yes Jermar Colter, Chrissie Noa, MD  fluticasone (FLONASE) 50 MCG/ACT nasal spray Place 2 sprays into both nostrils daily.   Yes [provider]  furosemide (LASIX) 80 MG tablet Take 1 tablet (80 mg total) by mouth daily. 08/03/17  Yes Reshawn Ostlund, Chrissie Noa, MD  gabapentin (NEURONTIN) 600 MG tablet Take 2 tablets (1,200 mg total) by  mouth 2 (two) times daily. 09/27/17  Yes Thao Bauza, Chrissie Noa, MD  ketoconazole (NIZORAL) 2 % shampoo U TO WASH SCALP 3 TIMES PER WEEK 08/30/17  Yes [provider]  losartan (COZAAR) 25 MG tablet Take 25 mg by mouth. 07/24/17  Yes [provider]  mirtazapine (REMERON) 15 MG tablet Take 1 tablet (15 mg total) by mouth at bedtime. 09/27/17  Yes Salimah Martinovich, Chrissie Noa, MD  omeprazole (PRILOSEC) 40 MG  capsule Take 40 mg by mouth. 07/24/17 07/24/18 Yes [provider]  OXYGEN Inhale 4 L into the lungs.    Yes [provider]  promethazine (PHENERGAN) 25 MG tablet Take 0.5 tablets (12.5 mg total) by mouth every 6 (six) hours as needed for nausea. 08/16/17  Yes Raye Wiens, Chrissie Noa, MD  spironolactone (ALDACTONE) 25 MG tablet Take 1 tablet (25 mg total) by mouth daily. 09/27/17  Yes Naleyah Ohlinger, Chrissie Noa, MD  traMADol (ULTRAM) 50 MG tablet Take 1 tablet (50 mg total) by mouth 2 (two) times daily. 09/30/17   Schuyler Amor, MD    Allergies  Allergen Reactions  . Sulfa Antibiotics Itching    Pt states she gets "itching inside and out"    Past Surgical History:  Procedure Laterality Date  . REPLACEMENT TOTAL KNEE    . TYMPANOSTOMY TUBE PLACEMENT Bilateral 03/19/2017    Social History   Tobacco Use  . Smoking status: Former Smoker    Packs/day: 3.00    Years: 55.00    Pack years: 165.00    Types: Cigarettes    Last attempt to quit: 2018    Years since quitting: 1.1  . Smokeless tobacco: Never Used  . Tobacco comment: smoking cessation materials not required  Substance Use Topics  . Alcohol use: No  . Drug use: No    Family History  Problem Relation Age of Onset  . Heart disease Mother   . Heart disease Sister   . Cancer Sister     Medication list has been reviewed and updated.  Physical Examination: BP 128/84   Pulse 78   Resp 16   Ht 5\' 2"  (1.575 m)   Wt 180 lb (81.6 kg)   SpO2 100% Comment: 4 liters  BMI 32.92 kg/m   Physical Exam  Constitutional: She appears well-developed and well-nourished. No distress.  Cardiovascular: Normal rate, regular rhythm and normal heart sounds.  Pulmonary/Chest: Effort normal.  Diffuse exp wheezing  Musculoskeletal: She exhibits no edema.  Neurological: She is alert.  Skin: Skin is warm and dry.  Psychiatric: She has a normal mood and affect. Her behavior is normal.  Nursing note and vitals reviewed.   Assessment and  Plan:  1. Chronic systolic heart failure (HCC) Compensated currently on losartan/Coreg/Lasix/Aldactone, followed by cards  2. Chronic obstructive pulmonary disease, unspecified COPD type (HCC) Symptomatic at baseline, consider long term prednisone, will try scheduled tramadol to help with dyspnea/cough  3. CKD (chronic kidney disease) stage 3, GFR 30-59 ml/min (HCC) Stable, avoiding NSAIDS  4. Essential hypertension Well controlled on current regimen  5. Chronic pain syndrome Poor control on gabapentin, trial scheduled tramadol  6. Gastroesophageal reflux disease, esophagitis presence not specified Intolerant PPI taper  7. Allergic rhinitis, unspecified seasonality, unspecified trigger Adequate control on Flonase  8. Hyperlipidemia, unspecified hyperlipidemia type Well controlled on Lipitor  9. Insomnia, unspecified type Improved on Remeron  10. Vitamin D deficiency Well controlled on supplement  11. B12 deficiency Well controlled on supplement  Return in about 4 weeks (around 10/28/2017).  Dionne Ano. Grayton Lobo, Jr.  MD [facility]  09/30/2017

## 2017-09-30 NOTE — Patient Instructions (Signed)
[name] [name] , Thank you for taking time to come for your Medicare Wellness Visit. I appreciate your ongoing commitment to your health goals. Please review the following plan we discussed and let me know if I can assist you in the future.   Screening recommendations/referrals: Colorectal Screening: No longer required Mammogram: No longer required Bone Density: No longer required  Vision/Dental Exams: Recommended yearly ophthalmology/optometry visit for glaucoma screening and checkup Recommended yearly dental visit for hygiene and checkup  Vaccinations: Influenza vaccine: Up to date Pneumococcal vaccine: Completed series Tdap vaccine: Up to date Shingles vaccine: Please call your insurance company to determine your out of pocket expense for the Shingrix vaccine. You may also receive this vaccine at your local pharmacy or Health Dept.    Advanced directives: Advance directive discussed with you today. I have provided a copy for you to complete at home and have notarized. Once this is complete please bring a copy in to our office so we can scan it into your chart.  Conditions/risks identified: Recommend to drink at least 6-8 8oz glasses of water per day.  Next appointment: Please schedule your Annual Wellness Visit with your Nurse Health Advisor in one year.  Preventive Care 74 Years and Older, Female Preventive care refers to lifestyle choices and visits with your health care provider that can promote health and wellness. What does preventive care include?  A yearly physical exam. This is also called an annual well check.  Dental exams once or twice a year.  Routine eye exams. Ask your health care provider how often you should have your eyes checked.  Personal lifestyle choices, including:  Daily care of your teeth and gums.  Regular physical activity.  Eating a healthy diet.  Avoiding tobacco and drug use.  Limiting alcohol use.  Practicing safe sex.  Taking low-dose  aspirin every day.  Taking vitamin and mineral supplements as recommended by your health care provider. What happens during an annual well check? The services and screenings done by your health care provider during your annual well check will depend on your age, overall health, lifestyle risk factors, and family history of disease. Counseling  Your health care provider may ask you questions about your:  Alcohol use.  Tobacco use.  Drug use.  Emotional well-being.  Home and relationship well-being.  Sexual activity.  Eating habits.  History of falls.  Memory and ability to understand (cognition).  Work and work Astronomer.  Reproductive health. Screening  You may have the following tests or measurements:  Height, weight, and BMI.  Blood pressure.  Lipid and cholesterol levels. These may be checked every 5 years, or more frequently if you are over 30 years old.  Skin check.  Lung cancer screening. You may have this screening every year starting at age 72 if you have a 30-pack-year history of smoking and currently smoke or have quit within the past 15 years.  Fecal occult blood test (FOBT) of the stool. You may have this test every year starting at age 42.  Flexible sigmoidoscopy or colonoscopy. You may have a sigmoidoscopy every 5 years or a colonoscopy every 10 years starting at age 51.  Hepatitis C blood test.  Hepatitis B blood test.  Sexually transmitted disease (STD) testing.  Diabetes screening. This is done by checking your blood sugar (glucose) after you have not eaten for a while (fasting). You may have this done every 1-3 years.  Bone density scan. This is done to screen for osteoporosis. You may  have this done starting at age 63.  Mammogram. This may be done every 1-2 years. Talk to your health care provider about how often you should have regular mammograms. Talk with your health care provider about your test results, treatment options, and if  necessary, the need for more tests. Vaccines  Your health care provider may recommend certain vaccines, such as:  Influenza vaccine. This is recommended every year.  Tetanus, diphtheria, and acellular pertussis (Tdap, Td) vaccine. You may need a Td booster every 10 years.  Zoster vaccine. You may need this after age 18.  Pneumococcal 13-valent conjugate (PCV13) vaccine. One dose is recommended after age 65.  Pneumococcal polysaccharide (PPSV23) vaccine. One dose is recommended after age 15. Talk to your health care provider about which screenings and vaccines you need and how often you need them. This information is not intended to replace advice given to you by your health care provider. Make sure you discuss any questions you have with your health care provider. Document Released: 08/09/2015 Document Revised: 04/01/2016 Document Reviewed: 05/14/2015 Elsevier Interactive Patient Education  2017 [employer].  Fall Prevention in the Home Falls can cause injuries. They can happen to people of all ages. There are many things you can do to make your home safe and to help prevent falls. What can I do on the outside of my home?  Regularly fix the edges of walkways and driveways and fix any cracks.  Remove anything that might make you trip as you walk through a door, such as a raised step or threshold.  Trim any bushes or trees on the path to your home.  Use bright outdoor lighting.  Clear any walking paths of anything that might make someone trip, such as rocks or tools.  Regularly check to see if handrails are loose or broken. Make sure that both sides of any steps have handrails.  Any raised decks and porches should have guardrails on the edges.  Have any leaves, snow, or ice cleared regularly.  Use sand or salt on walking paths during winter.  Clean up any spills in your garage right away. This includes oil or grease spills. What can I do in the bathroom?  Use night  lights.  Install grab bars by the toilet and in the tub and shower. Do not use towel bars as grab bars.  Use non-skid mats or decals in the tub or shower.  If you need to sit down in the shower, use a plastic, non-slip stool.  Keep the floor dry. Clean up any water that spills on the floor as soon as it happens.  Remove soap buildup in the tub or shower regularly.  Attach bath mats securely with double-sided non-slip rug tape.  Do not have throw rugs and other things on the floor that can make you trip. What can I do in the bedroom?  Use night lights.  Make sure that you have a light by your bed that is easy to reach.  Do not use any sheets or blankets that are too big for your bed. They should not hang down onto the floor.  Have a firm chair that has side arms. You can use this for support while you get dressed.  Do not have throw rugs and other things on the floor that can make you trip. What can I do in the kitchen?  Clean up any spills right away.  Avoid walking on wet floors.  Keep items that you use a lot in  easy-to-reach places.  If you need to reach something above you, use a strong step stool that has a grab bar.  Keep electrical cords out of the way.  Do not use floor polish or wax that makes floors slippery. If you must use wax, use non-skid floor wax.  Do not have throw rugs and other things on the floor that can make you trip. What can I do with my stairs?  Do not leave any items on the stairs.  Make sure that there are handrails on both sides of the stairs and use them. Fix handrails that are broken or loose. Make sure that handrails are as long as the stairways.  Check any carpeting to make sure that it is firmly attached to the stairs. Fix any carpet that is loose or worn.  Avoid having throw rugs at the top or bottom of the stairs. If you do have throw rugs, attach them to the floor with carpet tape.  Make sure that you have a light switch at the  top of the stairs and the bottom of the stairs. If you do not have them, ask someone to add them for you. What else can I do to help prevent falls?  Wear shoes that:  Do not have high heels.  Have rubber bottoms.  Are comfortable and fit you well.  Are closed at the toe. Do not wear sandals.  If you use a stepladder:  Make sure that it is fully opened. Do not climb a closed stepladder.  Make sure that both sides of the stepladder are locked into place.  Ask someone to hold it for you, if possible.  Clearly mark and make sure that you can see:  Any grab bars or handrails.  First and last steps.  Where the edge of each step is.  Use tools that help you move around (mobility aids) if they are needed. These include:  Canes.  Walkers.  Scooters.  Crutches.  Turn on the lights when you go into a dark area. Replace any light bulbs as soon as they burn out.  Set up your furniture so you have a clear path. Avoid moving your furniture around.  If any of your floors are uneven, fix them.  If there are any pets around you, be aware of where they are.  Review your medicines with your doctor. Some medicines can make you feel dizzy. This can increase your chance of falling. Ask your doctor what other things that you can do to help prevent falls. This information is not intended to replace advice given to you by your health care provider. Make sure you discuss any questions you have with your health care provider. Document Released: 05/09/2009 Document Revised: 12/19/2015 Document Reviewed: 08/17/2014 Elsevier Interactive Patient Education  2017 [employer].

## 2017-09-30 NOTE — Progress Notes (Signed)
Subjective:   [name] [name] is a 72 y.o. female who presents for an Initial Medicare Annual Wellness Visit.  Review of Systems    N/A  Cardiac Risk Factors include: advanced age (>59men, >63 women);dyslipidemia;hypertension;obesity (BMI >30kg/m2);sedentary lifestyle;smoking/ tobacco exposure     Objective:    Today's Vitals   09/30/17 1549 09/30/17 1551  BP: 128/84   Pulse: 78   Resp: 20   Temp: (!) 97.5 F (36.4 C)   TempSrc: Oral   SpO2: 100%   Weight: 180 lb (81.6 kg)   Height: 5\' 2"  (1.575 m)   PainSc:  10-Worst pain ever   Body mass index is 32.92 kg/m.  Advanced Directives 09/30/2017 09/18/2017 05/24/2017 04/26/2017 03/24/2017 03/13/2017 06/03/2016  Does Patient Have a Medical Advance Directive? No No No No No No Yes  Does patient want to make changes to medical advance directive? Yes (MAU/Ambulatory/Procedural Areas - Information given) - - - - - -  Would patient like information on creating a medical advance directive? - No - Patient declined - - - No - Patient declined -    Current Medications (verified) Outpatient Encounter Medications as of 09/30/2017  Medication Sig  . acetaminophen (TYLENOL) 500 MG tablet Take 2 tablets (1,000 mg total) by mouth 3 (three) times daily.  Marland Kitchen albuterol (PROVENTIL HFA;VENTOLIN HFA) 108 (90 Base) MCG/ACT inhaler Inhale 2 puffs into the lungs every 6 (six) hours as needed for wheezing or shortness of breath.  Marland Kitchen albuterol (PROVENTIL) (2.5 MG/3ML) 0.083% nebulizer solution Take 3 mLs (2.5 mg total) by nebulization every 6 (six) hours as needed for wheezing or shortness of breath.  Marland Kitchen aspirin 81 MG chewable tablet Chew 81 mg by mouth daily.  Marland Kitchen atorvastatin (LIPITOR) 40 MG tablet Take 1 tablet (40 mg total) by mouth daily.  . budesonide-formoterol (SYMBICORT) 160-4.5 MCG/ACT inhaler Inhale 2 puffs into the lungs 2 (two) times daily.  . carvedilol (COREG) 25 MG tablet Take 1 tablet (25 mg total) by mouth 2 (two) times daily.  . Cholecalciferol  (VITAMIN D3) 1000 units CAPS Take 1 capsule by mouth daily.   . Cyanocobalamin (B-12) 1000 MCG TABS Take 1 tablet by mouth daily.  . fluticasone (FLONASE) 50 MCG/ACT nasal spray Place 2 sprays into both nostrils daily.  . furosemide (LASIX) 80 MG tablet Take 1 tablet (80 mg total) by mouth daily.  Marland Kitchen gabapentin (NEURONTIN) 600 MG tablet Take 2 tablets (1,200 mg total) by mouth 2 (two) times daily.  Marland Kitchen ketoconazole (NIZORAL) 2 % shampoo U TO WASH SCALP 3 TIMES PER WEEK  . losartan (COZAAR) 25 MG tablet Take 25 mg by mouth.  . mirtazapine (REMERON) 15 MG tablet Take 1 tablet (15 mg total) by mouth at bedtime.  Marland Kitchen omeprazole (PRILOSEC) 40 MG capsule Take 40 mg by mouth.  . OXYGEN Inhale 4 L into the lungs.   . promethazine (PHENERGAN) 25 MG tablet Take 0.5 tablets (12.5 mg total) by mouth every 6 (six) hours as needed for nausea.  Marland Kitchen spironolactone (ALDACTONE) 25 MG tablet Take 1 tablet (25 mg total) by mouth daily.  . [DISCONTINUED] promethazine-codeine (PHENERGAN WITH CODEINE) 6.25-10 MG/5ML syrup Take 5 mLs by mouth every 6 (six) hours as needed for cough.   No facility-administered encounter medications on file as of 09/30/2017.     Allergies (verified) Sulfa antibiotics   History: Past Medical History:  Diagnosis Date  . Chronic combined systolic (congestive) and diastolic (congestive) heart failure (HCC)    a. 02/2017 Echo: EF 25-30%,  Gr2 DD.  Marland Kitchen COPD (chronic obstructive pulmonary disease) (HCC)    a. On supplemental O2 @ home.  Marland Kitchen Hypertension   . Lung nodule   . Narrow complex tachycardia (HCC)    a. 02/2017 - ? NSVT vs Afib-->placed on amio.  No OAC.  Marland Kitchen NICM (nonischemic cardiomyopathy) (HCC)    a. 02/2017 Echo: Ef 25-30%, mild conc LVH, Gr2 DD, sev MR, mod dil LA;  b. 02/2016 Cath [facility]): nonobs dzs.  . Non-obstructive CAD    a. 02/2016 Cath [facility]): LM 30, LAD 20ost/p, 22m, 30d, D1 50p, D2 30, D3 20, RI 20, LCX 40p, OM1 30, OM2 20, RCA 20 diffuse, RPL2 30, RPL3 30.  Marland Kitchen Severe mitral  regurgitation    a. 02/2017 Echo: Sev MR.   Past Surgical History:  Procedure Laterality Date  . REPLACEMENT TOTAL KNEE    . TYMPANOSTOMY TUBE PLACEMENT Bilateral 03/19/2017   Family History  Problem Relation Age of Onset  . Heart disease Mother   . Heart disease Sister   . Cancer Sister    Social History   Socioeconomic History  . Marital status: Legally Separated    Spouse name: None  . Number of children: 2  . Years of education: None  . Highest education level: 10th grade  Social Needs  . Financial resource strain: Not hard at all  . Food insecurity - worry: Never true  . Food insecurity - inability: Never true  . Transportation needs - medical: Yes  . Transportation needs - non-medical: Yes  Occupational History  . Occupation: RETIRED  Tobacco Use  . Smoking status: Former Smoker    Packs/day: 3.00    Years: 55.00    Pack years: 165.00    Types: Cigarettes    Last attempt to quit: 2018    Years since quitting: 1.1  . Smokeless tobacco: Never Used  . Tobacco comment: smoking cessation materials not required  Substance and Sexual Activity  . Alcohol use: No  . Drug use: No  . Sexual activity: Not Currently  Other Topics Concern  . None  Social History Narrative  . None    Tobacco Counseling Counseling given: No Comment: smoking cessation materials not required   Clinical Intake:  Pre-visit preparation completed: Yes  Pain : 0-10 Pain Score: 10-Worst pain ever Pain Type: Chronic pain   BMI - recorded: 32.92 Nutritional Risks: None Diabetes: No   Interpreter Needed?: No  Information entered by :: AEversole, LPN   Activities of Daily Living In your present state of health, do you have any difficulty performing the following activities: 09/30/2017 08/09/2017  Hearing? N Y  Comment denies hearing aids -  Vision? Y Y  Comment wears eyeglasses but cannot see out of them -  Difficulty concentrating or making decisions? N N  Walking or climbing  stairs? Y Y  Comment avoids due to dyspnea; requires ramp to assist with access to home -  Dressing or bathing? Malvin Johns  Comment with assistance -  Doing errands, shopping? Malvin Johns  Comment caregiver transports to appts -  Quarry manager and eating ? N -  Comment denies dentures and unable to eat without teeeth -  Using the Toilet? N -  In the past six months, have you accidently leaked urine? Y -  Comment stress incontinence, wears pads -  Do you have problems with loss of bowel control? N -  Managing your Medications? Y -  Comment caregiver manages medications -  Managing your Finances?  Y -  Comment caregiver manages finances -  Housekeeping or managing your Housekeeping? Y -  Comment cargiver takes care of home -  Some recent data might be hidden     Immunizations and Health Maintenance Immunization History  Administered Date(s) Administered  . Influenza, High Dose Seasonal PF 04/30/2017  . Influenza, Seasonal, Injecte, Preservative Fre 10/21/2011  . Influenza,inj,Quad PF,6+ Mos 04/27/2016  . Pneumococcal Conjugate-13 06/16/2016  . Pneumococcal Polysaccharide-23 06/02/2017  . Tdap 06/26/2017   There are no preventive care reminders to display for this patient.  Patient Care Team: Schuyler Amor, MD as PCP - General (Family Medicine) Delma Freeze, FNP as Physician Assistant (Family Medicine) Creig Hines, NP as Nurse Practitioner (Nurse Practitioner)  Indicate any recent Medical Services you may have received from other than [facility] providers in the past year (date may be approximate).     Assessment:   This is a routine wellness examination for [facility].  Hearing/Vision screen Vision Screening Comments: Not established with a provider for annual eye exams. Unable to see out of her galsses  Dietary issues and exercise activities discussed: Exercise limited by: respiratory conditions(s);cardiac condition(s)  Goals    . DIET - INCREASE WATER INTAKE     Recommend  to drink at least 6-8 8oz glasses of water per day.      Depression Screen PHQ 2/9 Scores 09/30/2017 08/09/2017 08/04/2017 08/02/2017 05/24/2017 04/26/2017 03/31/2017  PHQ - 2 Score 6 3 0 0 1 3 2   PHQ- 9 Score 16 11 - - - - 8    Fall Risk Fall Risk  09/30/2017 08/09/2017 08/04/2017 08/02/2017 05/24/2017  Falls in the past year? Yes Yes No No No  Comment fell in the snow when trying to get into the car - - - -  Number falls in past yr: 1 1 - - -  Injury with Fall? No No - - -  Risk Factor Category  - - - - -  Risk for fall due to : History of fall(s);Impaired balance/gait;Impaired vision;Medication side effect - - - -  Risk for fall due to: Comment dyspnea, unable to see when ambulating; fatigue - - - -  Follow up Falls evaluation completed;Education provided;Falls prevention discussed Falls prevention discussed - - -    Is the patient's home free of loose throw rugs in walkways, pet beds, electrical cords, etc?   Yes Does the patient have any grab bars in the bathroom? No  Does the patient use a shower chair when bathing? No Does the patient have any stairs in or around the home? Yes If so, are there any handrails?  No Does the patient have adequate lighting?  Yes Does the patient use a cane, walker or w/c? Yes, w/c Does the patient use of an elevated toilet seat? Yes  Timed Get Up and Go Performed: Yes. Pt attempted to ambulate 10 feet but was unable to finish fall risk screening due to inability to see clearly out of glasses, severe dyspnea and inability to manage portable O2 tank safely. Gait unsteady with 2 person assist. May benefit from PT/OT for strengthening. Fall risk prevention has been discussed.  Community Resource Referral sent to Care Guide for installation of ramp to gain easier access to entrance of home, affordable glasses to see when ambulating and Inogen One lightweight portable O2 system with carrying case. Caregiver is also a friend who may benefit from Respite Care.  Cognitive  Function:     6CIT Screen 09/30/2017  What  Year? 0 points  What month? 0 points  What time? 3 points  Count back from 20 0 points  Months in reverse 0 points  Repeat phrase 0 points  Total Score 3    Screening Tests Health Maintenance  Topic Date Due  . Hepatitis C Screening  10/01/2018 (Originally Dec 27, 1945)  . TETANUS/TDAP  06/27/2027  . INFLUENZA VACCINE  Completed  . PNA vac Low Risk Adult  Completed  . MAMMOGRAM  Discontinued  . DEXA SCAN  Discontinued  . COLONOSCOPY  Discontinued    Qualifies for Shingles Vaccine? Yes. Due for Zostavax or Shingrix vaccine. Education has been provided regarding the importance of this vaccine. Pt has been advised to call her insurance company to determine her out of pocket expense. Advised she may also receive this vaccine at her local pharmacy or Health Dept. Verbalized acceptance and understanding.  Cancer Screenings: Lung: Low Dose CT Chest recommended if Age 65-80 years, 30 pack-year currently smoking OR have quit w/in 15years. Patient does not qualify. Breast Screenings: No longer required  Bone Density Screening: No longer required Colorectal: No longer required  Additional Screenings: Hepatitis B/HIV/Syphillis: Does not qualify Hepatitis C Screening: Does not qualify    Plan:  I have personally reviewed and addressed the Medicare Annual Wellness questionnaire and have noted the following in the patient's chart:  A. Medical and social history B. Use of alcohol, tobacco or illicit drugs  C. Current medications and supplements D. Functional ability and status E.  Nutritional status F.  Physical activity G. Advance directives H. List of other physicians I.  Hospitalizations, surgeries, and ER visits in previous 12 months J.  Vitals K. Screenings such as hearing and vision if needed, cognitive and depression L. Referrals and appointments - none  In addition, I have reviewed and discussed with patient certain preventive  protocols, quality metrics, and best practice recommendations. A written personalized care plan for preventive services as well as general preventive health recommendations were provided to patient.  Signed,  Deon Pilling, LPN Nurse Health Advisor  MD Recommendations: Due for Zostavax or Shingrix vaccine. Education has been provided regarding the importance of this vaccine. Pt has been advised to call her insurance company to determine her out of pocket expense. Advised she may also receive this vaccine at her local pharmacy or Health Dept. Verbalized acceptance and understanding.  Community Resource Referral sent to Care Guide for installation of ramp to gain easier access to entrance of home, affordable glasses to see when ambulating and Inogen One lightweight portable O2 system with carrying case. Caregiver is also a friend who may benefit from Respite Care.

## 2017-10-01 ENCOUNTER — Encounter: Payer: Self-pay | Admitting: Family Medicine

## 2017-10-01 ENCOUNTER — Other Ambulatory Visit: Payer: Self-pay | Admitting: Family Medicine

## 2017-10-01 MED ORDER — TRAMADOL HCL 50 MG PO TABS: 50.0000 mg | ORAL_TABLET | Freq: Two times a day (BID) | ORAL | 0 refills | Status: DC

## 2017-10-01 NOTE — Telephone Encounter (Signed)
Patient mychart message.

## 2017-10-05 ENCOUNTER — Other Ambulatory Visit: Payer: Self-pay | Admitting: Family Medicine

## 2017-10-07 ENCOUNTER — Telehealth: Payer: Self-pay

## 2017-10-07 NOTE — Telephone Encounter (Signed)
Tried to call and advise appointment in [facility] for xrays and see how she is after falls. No answer

## 2017-10-13 ENCOUNTER — Encounter: Payer: Self-pay | Admitting: Family Medicine

## 2017-10-13 ENCOUNTER — Ambulatory Visit (INDEPENDENT_AMBULATORY_CARE_PROVIDER_SITE_OTHER): Payer: Medicare Other | Admitting: Family Medicine

## 2017-10-13 ENCOUNTER — Ambulatory Visit
Admission: RE | Admit: 2017-10-13 | Discharge: 2017-10-13 | Disposition: A | Discharge status: Home / Self Care | Payer: Medicare Other | Source: Ambulatory Visit | Attending: Family Medicine | Admitting: Family Medicine

## 2017-10-13 VITALS — BP 124/70 | HR 72 | Ht 62.0 in | Wt 199.0 lb

## 2017-10-13 DIAGNOSIS — R937 Abnormal findings on diagnostic imaging of other parts of musculoskeletal system: Secondary | ICD-10-CM | POA: Insufficient documentation

## 2017-10-13 DIAGNOSIS — E785 Hyperlipidemia, unspecified: Secondary | ICD-10-CM

## 2017-10-13 DIAGNOSIS — G894 Chronic pain syndrome: Secondary | ICD-10-CM | POA: Diagnosis not present

## 2017-10-13 DIAGNOSIS — M25561 Pain in right knee: Secondary | ICD-10-CM

## 2017-10-13 DIAGNOSIS — J449 Chronic obstructive pulmonary disease, unspecified: Secondary | ICD-10-CM | POA: Diagnosis not present

## 2017-10-13 DIAGNOSIS — M25562 Pain in left knee: Secondary | ICD-10-CM

## 2017-10-13 DIAGNOSIS — I10 Essential (primary) hypertension: Secondary | ICD-10-CM

## 2017-10-13 DIAGNOSIS — N183 Chronic kidney disease, stage 3 unspecified: Secondary | ICD-10-CM

## 2017-10-13 DIAGNOSIS — K219 Gastro-esophageal reflux disease without esophagitis: Secondary | ICD-10-CM

## 2017-10-13 DIAGNOSIS — I5022 Chronic systolic (congestive) heart failure: Secondary | ICD-10-CM | POA: Diagnosis not present

## 2017-10-13 DIAGNOSIS — G4700 Insomnia, unspecified: Secondary | ICD-10-CM | POA: Diagnosis not present

## 2017-10-13 MED ORDER — GABAPENTIN 600 MG PO TABS: 1200.0000 mg | ORAL_TABLET | Freq: Three times a day (TID) | ORAL | 2 refills | Status: DC

## 2017-10-13 NOTE — Telephone Encounter (Signed)
Patient message

## 2017-10-13 NOTE — Progress Notes (Signed)
Date:  10/13/2017   Name:  [name] [name]   DOB:  [birth year]   MRN:  [id number]  PCP:  Adline Potter, MD    Chief Complaint: Follow-up (fall- hurt knees, feel on R) knee)   History of Present Illness:  This is a 72 y.o. female seen for two week f/u. Tramadol caused increased confusion and gait instability, fell on knees, still having pain a week later, worse with weight bearing. Also c/o L shoulder and L costal margin pain with movement, not taking Tylenol. Breathing stable, palliative NP heard wheezes this AM, resolved with albuterol neb. Insomnia improved on Remeron.  Review of Systems:  Review of Systems  Constitutional: Negative for chills and fever.  Cardiovascular: Negative for leg swelling.  Genitourinary: Negative for difficulty urinating and dysuria.  Neurological: Negative for syncope and light-headedness.    Patient Active Problem List   Diagnosis Date Noted  . CKD (chronic kidney disease) stage 3, GFR 30-59 ml/min (HCC) 08/20/2017  . Insomnia 07/22/2017  . Hyperlipidemia 07/01/2017  . DDD (degenerative disc disease), lumbar 04/30/2017  . B12 deficiency 04/30/2017  . Gait instability 03/31/2017  . Allergic rhinitis 03/31/2017  . Nausea 03/31/2017  . GERD (gastroesophageal reflux disease) 03/31/2017  . Chronic systolic heart failure ([place]) 03/24/2017  . HTN (hypertension) 03/24/2017  . COPD (chronic obstructive pulmonary disease) ([place]) 03/24/2017  . Chronic pain 03/24/2017  . MRSA carrier 03/18/2017  . History of ventricular tachycardia 03/18/2017  . Admission for palliative care 03/10/2017  . Vitamin D deficiency 01/06/2017  . Chronic atrial fibrillation ([place]) 06/15/2016  . Depression 06/15/2016  . Varicose veins of lower extremity with inflammation 09/07/2011    Prior to Admission medications   Medication Sig Start Date End Date Taking? Authorizing Provider  albuterol (PROVENTIL HFA;VENTOLIN HFA) 108 (90 Base) MCG/ACT inhaler Inhale 2 puffs into the lungs  every 6 (six) hours as needed for wheezing or shortness of breath. 04/12/17  Yes Liset Mcmonigle, Gwyndolyn Saxon, MD  albuterol (PROVENTIL) (2.5 MG/3ML) 0.083% nebulizer solution Take 3 mLs (2.5 mg total) by nebulization every 6 (six) hours as needed for wheezing or shortness of breath. 09/01/17  Yes Kiva Norland, Gwyndolyn Saxon, MD  aspirin 81 MG chewable tablet Chew 81 mg by mouth daily.   Yes [provider]  atorvastatin (LIPITOR) 40 MG tablet Take 1 tablet (40 mg total) by mouth daily. 04/26/17  Yes Hackney, Otila Kluver A, FNP  budesonide-formoterol (SYMBICORT) 160-4.5 MCG/ACT inhaler Inhale 2 puffs into the lungs 2 (two) times daily. 07/07/17  Yes Shakeera Rightmyer, Gwyndolyn Saxon, MD  carvedilol (COREG) 25 MG tablet Take 1 tablet (25 mg total) by mouth 2 (two) times daily. 07/26/17  Yes Rahkim Rabalais, Gwyndolyn Saxon, MD  Cholecalciferol (VITAMIN D3) 1000 units CAPS Take 1 capsule by mouth daily.  07/24/17 07/24/18 Yes [provider]  Cyanocobalamin (B-12) 1000 MCG TABS Take 1 tablet by mouth daily. 04/05/17  Yes Jaida Basurto, Gwyndolyn Saxon, MD  fluticasone (FLONASE) 50 MCG/ACT nasal spray Place 2 sprays into both nostrils daily.   Yes [provider]  furosemide (LASIX) 80 MG tablet Take 1 tablet (80 mg total) by mouth daily. 08/03/17  Yes Alailah Safley, Gwyndolyn Saxon, MD  gabapentin (NEURONTIN) 600 MG tablet Take 2 tablets (1,200 mg total) by mouth 3 (three) times daily. 10/13/17  Yes Liyah Higham, Gwyndolyn Saxon, MD  ketoconazole (NIZORAL) 2 % shampoo U TO [place] SCALP 3 TIMES PER WEEK 08/30/17  Yes [provider]  losartan (COZAAR) 25 MG tablet Take 25 mg by mouth. 07/24/17  Yes [provider]  mirtazapine (REMERON) 15  MG tablet Take 1 tablet (15 mg total) by mouth at bedtime. 09/27/17  Yes Hebert Dooling, Gwyndolyn Saxon, MD  omeprazole (PRILOSEC) 40 MG capsule Take 40 mg by mouth. 07/24/17 07/24/18 Yes [provider]  OXYGEN Inhale 4 L into the lungs.    Yes [provider]  promethazine (PHENERGAN) 25 MG tablet Take 0.5 tablets (12.5 mg total) by mouth every 6  (six) hours as needed for nausea. 08/16/17  Yes Kairee Kozma, Gwyndolyn Saxon, MD  spironolactone (ALDACTONE) 25 MG tablet Take 1 tablet (25 mg total) by mouth daily. 09/27/17  Yes Zavien Clubb, Gwyndolyn Saxon, MD  acetaminophen (TYLENOL) 500 MG tablet Take 2 tablets (1,000 mg total) by mouth 3 (three) times daily. Patient not taking: Reported on 10/13/2017 08/02/17   Adline Potter, MD    Allergies  Allergen Reactions  . Sulfa Antibiotics Itching    Pt states she gets "itching inside and out"  . Tramadol Hcl Other (See Comments)    Falls, confusion    Past Surgical History:  Procedure Laterality Date  . REPLACEMENT TOTAL KNEE    . TYMPANOSTOMY TUBE PLACEMENT Bilateral 03/19/2017    Social History   Tobacco Use  . Smoking status: Former Smoker    Packs/day: 3.00    Years: 55.00    Pack years: 165.00    Types: Cigarettes    Last attempt to quit: 2018    Years since quitting: 1.2  . Smokeless tobacco: Never Used  . Tobacco comment: smoking cessation materials not required  Substance Use Topics  . Alcohol use: No  . Drug use: No    Family History  Problem Relation Age of Onset  . Heart disease Mother   . Heart disease Sister   . Cancer Sister     Medication list has been reviewed and updated.  Physical Examination: BP 124/70   Pulse 72   Ht '5\' 2"'  (1.575 m)   Wt 199 lb (90.3 kg)   BMI 36.40 kg/m   Physical Exam  Constitutional: She appears well-developed and well-nourished. No distress.  Cardiovascular: Normal rate, regular rhythm and normal heart sounds.  Pulmonary/Chest: Effort normal and breath sounds normal.  Musculoskeletal: She exhibits no edema.  Neurological: She is alert.  Skin: Skin is warm and dry. She is not diaphoretic.  Psychiatric: She has a normal mood and affect. Her behavior is normal.  Nursing note and vitals reviewed.   Assessment and Plan:  1. Chronic systolic heart failure (HCC) Compensated on current regimen, cards following  2. Chronic obstructive pulmonary  disease, unspecified COPD type (HCC) Stable on O2, Symbicort, prn albuterol nebs  3. CKD (chronic kidney disease) stage 3, GFR 30-59 ml/min (HCC) Stable, eGFR 42 last month, avoiding NSAIDS  4. Chronic pain syndrome Increase gabapentin to 1200 mg tid, restart Tylenol 1000 mg tid, has [facility] pain clinic appt next week  5. Acute pain of both knees Refer [facility] orth[facility] for possible cortisone injections - DG Knee Complete 4 Views Left; Future - DG Knee Complete 4 Views Right; Future - Ambulatory referral to Orthopedic Surgery  6. Essential hypertension Well controlled on current regimen  7. Gastroesophageal reflux disease, esophagitis presence not specified Intolerant PPI taper  8. Hyperlipidemia, unspecified hyperlipidemia type Well controlled on Lipitor  9. Insomnia, unspecified type Improved on Remeron but weight up 11#, monitor  Return in about 4 weeks (around 11/10/2017).  Satira Anis. [facility]  10/13/2017

## 2017-10-15 ENCOUNTER — Telehealth: Payer: Self-pay

## 2017-10-15 NOTE — Telephone Encounter (Signed)
Discussed Home Health and Hospice with Health Advisor and patient refuses to do Hospice Home att his time so advised that we all can keep discussing and so can Social workers and [employer] and anyone else as we try to let her know the benefits as we all work with her to help her and family with concerns and well being.

## 2017-10-16 ENCOUNTER — Encounter: Payer: Self-pay | Admitting: Family Medicine

## 2017-10-17 ENCOUNTER — Encounter: Payer: Self-pay | Admitting: Family Medicine

## 2017-10-18 NOTE — Telephone Encounter (Signed)
Patient mychart

## 2017-10-18 NOTE — Telephone Encounter (Signed)
Patient mychart message.

## 2017-10-19 ENCOUNTER — Other Ambulatory Visit: Payer: Self-pay | Admitting: Family Medicine

## 2017-10-19 DIAGNOSIS — G894 Chronic pain syndrome: Secondary | ICD-10-CM

## 2017-10-19 MED ORDER — GABAPENTIN 600 MG PO TABS: 1200.0000 mg | ORAL_TABLET | Freq: Three times a day (TID) | ORAL | 2 refills | Status: DC

## 2017-10-28 ENCOUNTER — Ambulatory Visit: Payer: Medicare Other | Admitting: Family Medicine

## 2017-11-05 NOTE — Progress Notes (Signed)
Patient ID: [name] [name], female    DOB: 27-Nov-1945, 72 y.o.   MRN: [id number]  HPI  [name] [name] is a 72 y/o female with a history of HTN, COPD, chronic pain, previous tobacco use and chronic heart failure.   Echo report from 03/15/17 reviewed and showed an EF of 25-30% along with severe MR. This is unchanged from [facility] November 2017.  Was in the ED 09/18/17 due to shortness of breath where she was evaluated, treated and released.   She presents today for her follow-up visit with a chief complaint of moderate shortness of breath upon minimal exertion. She says this has been chronic in nature having been present for several years. She does feel like her breathing is worse but attributes that to her pain being worse. She has associated fatigue, weakness, difficulty sleeping, chronic pain and gradual weight gain. She denies any edema, abdominal distention, cough or wheezing. Says that she had a urine specimen obtained ~ 3 weeks ago but hasn't heard anything from the pain clinic about the results and when the pain patch will be started.   Past Medical History:  Diagnosis Date  . CHF (congestive heart failure) (HCC)   . COPD (chronic obstructive pulmonary disease) (HCC)   . Hypertension   . Lung nodule    Past Surgical History:  Procedure Laterality Date  . REPLACEMENT TOTAL KNEE     No family history on file. Social History  Substance Use Topics  . Smoking status: Former Games developer  . Smokeless tobacco: Never Used  . Alcohol use No   Allergies  Allergen Reactions  . Sulfa Antibiotics Itching   Prior to Admission medications   Medication Sig Start Date End Date Taking? Authorizing Provider  acetaminophen (TYLENOL) 500 MG tablet Take 2 tablets (1,000 mg total) by mouth 3 (three) times daily. 08/02/17  Yes Plonk, Chrissie Noa, MD  albuterol (PROVENTIL HFA;VENTOLIN HFA) 108 (90 Base) MCG/ACT inhaler Inhale 2 puffs into the lungs every 6 (six) hours as needed for wheezing or shortness of breath. 04/12/17   Yes Plonk, Chrissie Noa, MD  albuterol (PROVENTIL) (2.5 MG/3ML) 0.083% nebulizer solution Take 3 mLs (2.5 mg total) by nebulization every 6 (six) hours as needed for wheezing or shortness of breath. 09/01/17  Yes Plonk, Chrissie Noa, MD  aspirin 81 MG chewable tablet Chew 81 mg by mouth daily.   Yes [provider]  atorvastatin (LIPITOR) 40 MG tablet Take 1 tablet (40 mg total) by mouth daily. 04/26/17  Yes Kemara Quigley, Inetta Fermo A, FNP  budesonide-formoterol (SYMBICORT) 160-4.5 MCG/ACT inhaler Inhale 2 puffs into the lungs 2 (two) times daily. 07/07/17  Yes Plonk, Chrissie Noa, MD  carvedilol (COREG) 25 MG tablet Take 1 tablet (25 mg total) by mouth 2 (two) times daily. 07/26/17  Yes Plonk, Chrissie Noa, MD  Cholecalciferol (VITAMIN D3) 1000 units CAPS Take 1 capsule by mouth daily.  07/24/17 07/24/18 Yes [provider]  Cyanocobalamin (B-12) 1000 MCG TABS Take 1 tablet by mouth daily. 04/05/17  Yes Plonk, Chrissie Noa, MD  fluticasone (FLONASE) 50 MCG/ACT nasal spray Place 2 sprays into both nostrils daily.   Yes [provider]  furosemide (LASIX) 80 MG tablet Take 1 tablet (80 mg total) by mouth daily. 08/03/17  Yes Plonk, Chrissie Noa, MD  gabapentin (NEURONTIN) 600 MG tablet Take 2 tablets (1,200 mg total) by mouth 3 (three) times daily. 10/19/17  Yes Plonk, Chrissie Noa, MD  ketoconazole (NIZORAL) 2 % shampoo U TO WASH SCALP 3 TIMES PER WEEK 08/30/17  Yes [provider]  losartan (COZAAR) 25 MG tablet Take 25 mg by mouth. 07/24/17  Yes [provider]  mirtazapine (REMERON) 15 MG tablet Take 1 tablet (15 mg total) by mouth at bedtime. 09/27/17  Yes Plonk, Chrissie Noa, MD  omeprazole (PRILOSEC) 40 MG capsule Take 40 mg by mouth. 07/24/17 07/24/18 Yes [provider]  OXYGEN Inhale 4 L into the lungs.    Yes [provider]  promethazine (PHENERGAN) 25 MG tablet Take 0.5 tablets (12.5 mg total) by mouth every 6 (six) hours as needed for nausea. 08/16/17  Yes Plonk, Chrissie Noa, MD   spironolactone (ALDACTONE) 25 MG tablet Take 1 tablet (25 mg total) by mouth daily. 09/27/17  Yes Plonk, Chrissie Noa, MD    Review of Systems  Constitutional: Positive for fatigue. Negative for appetite change.  HENT: Positive for hearing loss. Negative for congestion, postnasal drip, ear pain and sore throat.   Eyes: Negative.   Respiratory: Positive for shortness of breath. Negative for cough and wheezing or chest pain.   Cardiovascular:  Negative for chest pain, palpitations and leg swelling.  Gastrointestinal: Negative for abdominal distention and pain Endocrine: Negative.   Genitourinary: Negative.   Musculoskeletal: Positive for back pain & right hip pain & both knees pain & left arm pain. Negative for neck pain.  Skin: Negative.   Allergic/Immunologic: Negative.   Neurological: Positive for weakness, light-headedness. Negative for dizziness, headaches.  Hematological: Negative for adenopathy. Bruises/bleeds easily.  Psychiatric/Behavioral: Positive for sleep disturbance (not sleeping well; unsure of why). Negative for dysphoric mood. The patient is not nervous/anxious.    Vitals:   11/08/17 1317  BP: (!) 98/47  Pulse: 73  Resp: 18  SpO2: 99%  Weight: 208 lb (94.3 kg)  Height: 5\' 2"  (1.575 m)   Wt Readings from Last 3 Encounters:  11/08/17 208 lb (94.3 kg)  10/13/17 199 lb (90.3 kg)  09/30/17 180 lb (81.6 kg)   Lab Results  Component Value Date   CREATININE 1.23 (H) 09/18/2017   CREATININE 1.30 (H) 09/01/2017   CREATININE 1.76 (H) 08/02/2017    Physical Exam  Constitutional: She is oriented to person, place, and time. She appears well-developed and well-nourished.  HENT:  Head: Normocephalic and atraumatic.  Right Ear: Decreased hearing is noted.  Left Ear: Decreased hearing is noted.  Neck: Normal range of motion. Neck supple. No JVD present.  Cardiovascular: Normal rate and regular rhythm.   Pulmonary/Chest: Effort normal and breath sounds normal.  Abdominal:  Soft. She exhibits no distension. There is no tenderness.  Musculoskeletal: She exhibits no edema or tenderness.  Neurological: She is alert and oriented to person, place, and time.  Skin: Skin is warm and dry.  Psychiatric: She has a normal mood and affect. Her behavior is normal. Thought content normal.  Nursing note and vitals reviewed.  Assessment & Plan:  1: Chronic heart failure with reduced ejection fraction- - NYHA class III - euvolemic today - weighing daily and she was reminded to call for an overnight weight gain of >2 pounds or a weekly weight gain of >5 pounds - weight up 9 pounds since 10/13/17 as she's not getting any exercise - admits to not getting any exercise due to the pain in her back, hip and knees and admits that she sits around a lot because of the pain  - adds salt "sometimes to eggs". Reminded to closely follow a 2000mg  sodium diet - could consider changing her losartan to entresto in the future if her BP allows - saw cardiology (  Brion Aliment) 05/06/17 - BNP 09/18/17 was 92.0  2: HTN- - BP on the low side today - saw PCP (Plonk) on 10/13/17 & returns 11/10/17 - BMP done 09/18/17 reviewed and shows sodium 140, potassium 4.2 and GFR 42   3: Degenerative disc disease- - currently taking gabapentin TID but without any easing of pain - went to pain clinic 10/19/17 and says that she gave a urine specimen for a possible pain patch to be started and hasn't heard anything from them. Advised her daughter-in-law to call the pain clinic back and follow-up with them - also saw orthopaedics Lenard Forth) 10/15/17  Patient did not bring her medications nor a list. Each medication was verbally reviewed with the patient and she was encouraged to bring the bottles to every visit to confirm accuracy of list.  Due to her heart failure being stable, will not make a return appointment at this time. Advised patient and her daughter-in-law that they could call back at any time to make another  appointment.

## 2017-11-08 ENCOUNTER — Encounter: Payer: Self-pay | Admitting: Family

## 2017-11-08 ENCOUNTER — Ambulatory Visit: Payer: Medicare Other | Attending: Family | Admitting: Family

## 2017-11-08 VITALS — BP 98/47 | HR 73 | Resp 18 | Ht 62.0 in | Wt 208.0 lb

## 2017-11-08 DIAGNOSIS — I110 Hypertensive heart disease with heart failure: Secondary | ICD-10-CM | POA: Diagnosis not present

## 2017-11-08 DIAGNOSIS — M5136 Other intervertebral disc degeneration, lumbar region: Secondary | ICD-10-CM

## 2017-11-08 DIAGNOSIS — R531 Weakness: Secondary | ICD-10-CM | POA: Insufficient documentation

## 2017-11-08 DIAGNOSIS — Z882 Allergy status to sulfonamides status: Secondary | ICD-10-CM | POA: Insufficient documentation

## 2017-11-08 DIAGNOSIS — I10 Essential (primary) hypertension: Secondary | ICD-10-CM

## 2017-11-08 DIAGNOSIS — J449 Chronic obstructive pulmonary disease, unspecified: Secondary | ICD-10-CM | POA: Diagnosis not present

## 2017-11-08 DIAGNOSIS — Z7982 Long term (current) use of aspirin: Secondary | ICD-10-CM | POA: Diagnosis not present

## 2017-11-08 DIAGNOSIS — M51369 Other intervertebral disc degeneration, lumbar region without mention of lumbar back pain or lower extremity pain: Secondary | ICD-10-CM

## 2017-11-08 DIAGNOSIS — I5022 Chronic systolic (congestive) heart failure: Secondary | ICD-10-CM | POA: Diagnosis present

## 2017-11-08 DIAGNOSIS — Z79899 Other long term (current) drug therapy: Secondary | ICD-10-CM | POA: Diagnosis not present

## 2017-11-08 DIAGNOSIS — Z87891 Personal history of nicotine dependence: Secondary | ICD-10-CM | POA: Diagnosis not present

## 2017-11-08 DIAGNOSIS — G8929 Other chronic pain: Secondary | ICD-10-CM | POA: Insufficient documentation

## 2017-11-08 NOTE — Patient Instructions (Signed)
Continue weighing daily and call for an overnight weight gain of > 2 pounds or a weekly weight gain of >5 pounds. 

## 2017-11-10 ENCOUNTER — Ambulatory Visit (INDEPENDENT_AMBULATORY_CARE_PROVIDER_SITE_OTHER): Payer: Medicare Other | Admitting: Family Medicine

## 2017-11-10 ENCOUNTER — Ambulatory Visit: Payer: Medicare Other | Admitting: Family Medicine

## 2017-11-10 ENCOUNTER — Other Ambulatory Visit: Payer: Self-pay | Admitting: Family Medicine

## 2017-11-10 ENCOUNTER — Encounter: Payer: Self-pay | Admitting: Family Medicine

## 2017-11-10 VITALS — BP 132/81 | HR 68 | Resp 16 | Ht 62.0 in | Wt 208.0 lb

## 2017-11-10 DIAGNOSIS — E559 Vitamin D deficiency, unspecified: Secondary | ICD-10-CM

## 2017-11-10 DIAGNOSIS — M179 Osteoarthritis of knee, unspecified: Secondary | ICD-10-CM | POA: Insufficient documentation

## 2017-11-10 DIAGNOSIS — G894 Chronic pain syndrome: Secondary | ICD-10-CM

## 2017-11-10 DIAGNOSIS — M1710 Unilateral primary osteoarthritis, unspecified knee: Secondary | ICD-10-CM | POA: Insufficient documentation

## 2017-11-10 DIAGNOSIS — G4700 Insomnia, unspecified: Secondary | ICD-10-CM | POA: Diagnosis not present

## 2017-11-10 DIAGNOSIS — E538 Deficiency of other specified B group vitamins: Secondary | ICD-10-CM

## 2017-11-10 DIAGNOSIS — I5022 Chronic systolic (congestive) heart failure: Secondary | ICD-10-CM

## 2017-11-10 DIAGNOSIS — M170 Bilateral primary osteoarthritis of knee: Secondary | ICD-10-CM

## 2017-11-10 DIAGNOSIS — N183 Chronic kidney disease, stage 3 unspecified: Secondary | ICD-10-CM

## 2017-11-10 DIAGNOSIS — I10 Essential (primary) hypertension: Secondary | ICD-10-CM | POA: Diagnosis not present

## 2017-11-10 DIAGNOSIS — J449 Chronic obstructive pulmonary disease, unspecified: Secondary | ICD-10-CM | POA: Diagnosis not present

## 2017-11-10 DIAGNOSIS — E785 Hyperlipidemia, unspecified: Secondary | ICD-10-CM | POA: Diagnosis not present

## 2017-11-10 DIAGNOSIS — K219 Gastro-esophageal reflux disease without esophagitis: Secondary | ICD-10-CM

## 2017-11-10 DIAGNOSIS — I340 Nonrheumatic mitral (valve) insufficiency: Secondary | ICD-10-CM | POA: Insufficient documentation

## 2017-11-10 MED ORDER — TRAZODONE HCL 50 MG PO TABS: 50.0000 mg | ORAL_TABLET | Freq: Every evening | ORAL | 2 refills | Status: DC | PRN

## 2017-11-10 MED ORDER — ATORVASTATIN CALCIUM 40 MG PO TABS: 40.0000 mg | ORAL_TABLET | Freq: Every day | ORAL | 3 refills | Status: DC

## 2017-11-10 NOTE — Progress Notes (Signed)
Date:  11/10/2017   Name:  [name] [name]   DOB:  [birth year]   MRN:  [id number]  PCP:  Schuyler Amor, MD    Chief Complaint: COPD (f/u-going to pain clinic awaiting buprenorphine 5 mcg/hour PTWK transdermal patch )   History of Present Illness:  This is a 72 y.o. female seen for one month f/u. Saw cards 2d ago, no med changes made. Saw ortho for B knee pain last month, told to return for cortisone shot if sxs unimproved, has not made f/u appt, agrees to HHPT. Saw pain medicine 3 weeks ago, Butrans patch recommended, to pick up from pharmacy tomorrow, remains on gabapentin. C/o pain along L costal margin radiating to L shoulder, chest well tender. COPD stable, still SOB with exertion, on continuous O2. Insomnia/mood ok on Remeron but gaining weight. Needs refill Lipitor.  Review of Systems:  Review of Systems  Constitutional: Negative for chills and fever.  Respiratory: Negative for cough.   Cardiovascular: Negative for leg swelling.  Genitourinary: Negative for difficulty urinating.  Neurological: Negative for syncope and light-headedness.    Patient Active Problem List   Diagnosis Date Noted  . Severe mitral regurgitation 11/10/2017  . CKD (chronic kidney disease) stage 3, GFR 30-59 ml/min (HCC) 08/20/2017  . Insomnia 07/22/2017  . Hyperlipidemia 07/01/2017  . DDD (degenerative disc disease), lumbar 04/30/2017  . B12 deficiency 04/30/2017  . Gait instability 03/31/2017  . Allergic rhinitis 03/31/2017  . Nausea 03/31/2017  . GERD (gastroesophageal reflux disease) 03/31/2017  . Chronic systolic heart failure (HCC) 03/24/2017  . HTN (hypertension) 03/24/2017  . COPD (chronic obstructive pulmonary disease) (HCC) 03/24/2017  . Chronic pain 03/24/2017  . MRSA carrier 03/18/2017  . History of ventricular tachycardia 03/18/2017  . Vitamin D deficiency 01/06/2017  . Chronic atrial fibrillation (HCC) 06/15/2016  . Depression 06/15/2016  . Varicose veins of lower extremity with  inflammation 09/07/2011    Prior to Admission medications   Medication Sig Start Date End Date Taking? Authorizing Provider  acetaminophen (TYLENOL) 500 MG tablet Take 2 tablets (1,000 mg total) by mouth 3 (three) times daily. 08/02/17  Yes Neville Pauls, Chrissie Noa, MD  albuterol (PROVENTIL HFA;VENTOLIN HFA) 108 (90 Base) MCG/ACT inhaler Inhale 2 puffs into the lungs every 6 (six) hours as needed for wheezing or shortness of breath. 04/12/17  Yes Kanye Depree, Chrissie Noa, MD  albuterol (PROVENTIL) (2.5 MG/3ML) 0.083% nebulizer solution Take 3 mLs (2.5 mg total) by nebulization every 6 (six) hours as needed for wheezing or shortness of breath. 09/01/17  Yes Clayburn Weekly, Chrissie Noa, MD  aspirin 81 MG chewable tablet Chew 81 mg by mouth daily.   Yes [provider]  atorvastatin (LIPITOR) 40 MG tablet Take 1 tablet (40 mg total) by mouth daily. 11/10/17  Yes Kattie Santoyo, Chrissie Noa, MD  budesonide-formoterol [facility]) 160-4.5 MCG/ACT inhaler Inhale 2 puffs into the lungs 2 (two) times daily. 07/07/17  Yes Emelda Kohlbeck, Chrissie Noa, MD  carvedilol (COREG) 25 MG tablet Take 1 tablet (25 mg total) by mouth 2 (two) times daily. 07/26/17  Yes Calena Salem, Chrissie Noa, MD  Cholecalciferol (VITAMIN D3) 1000 units CAPS Take 1 capsule by mouth daily.  07/24/17 07/24/18 Yes [provider]  Cyanocobalamin (B-12) 1000 MCG TABS Take 1 tablet by mouth daily. 04/05/17  Yes Williette Loewe, Chrissie Noa, MD  fluticasone (FLONASE) 50 MCG/ACT nasal spray Place 2 sprays into both nostrils daily.   Yes [provider]  furosemide (LASIX) 80 MG tablet Take 1 tablet (80 mg total) by mouth daily. 08/03/17  Yes Ihsan Nomura, Chrissie Noa, MD  gabapentin (NEURONTIN) 600 MG tablet Take 2 tablets (1,200 mg total) by mouth 3 (three) times daily. 10/19/17  Yes Zhanna Melin, Chrissie Noa, MD  ketoconazole (NIZORAL) 2 % shampoo U TO WASH SCALP 3 TIMES PER WEEK 08/30/17  Yes [provider]  losartan (COZAAR) 25 MG tablet Take 25 mg by mouth. 07/24/17  Yes [provider]  mirtazapine  (REMERON) 15 MG tablet Take 1 tablet (15 mg total) by mouth at bedtime. 09/27/17  Yes Samanta Gal, Chrissie Noa, MD  omeprazole (PRILOSEC) 40 MG capsule Take 40 mg by mouth. 07/24/17 07/24/18 Yes [provider]  OXYGEN Inhale 4 L into the lungs.    Yes [provider]  promethazine (PHENERGAN) 25 MG tablet Take 0.5 tablets (12.5 mg total) by mouth every 6 (six) hours as needed for nausea. 08/16/17  Yes Liya Strollo, Chrissie Noa, MD  spironolactone (ALDACTONE) 25 MG tablet Take 1 tablet (25 mg total) by mouth daily. 09/27/17  Yes Willella Harding, Chrissie Noa, MD    Allergies  Allergen Reactions  . Sulfa Antibiotics Itching    Pt states she gets "itching inside and out"  . Tramadol Hcl Other (See Comments)    Falls, confusion    Past Surgical History:  Procedure Laterality Date  . REPLACEMENT TOTAL KNEE    . TYMPANOSTOMY TUBE PLACEMENT Bilateral 03/19/2017    Social History   Tobacco Use  . Smoking status: Former Smoker    Packs/day: 3.00    Years: 55.00    Pack years: 165.00    Types: Cigarettes    Last attempt to quit: 2018    Years since quitting: 1.2  . Smokeless tobacco: Never Used  . Tobacco comment: smoking cessation materials not required  Substance Use Topics  . Alcohol use: No  . Drug use: No    Family History  Problem Relation Age of Onset  . Heart disease Mother   . Heart disease Sister   . Cancer Sister     Medication list has been reviewed and updated.  Physical Examination: BP 132/81   Pulse 68   Resp 16   Ht 5\' 2"  (1.575 m)   Wt 208 lb (94.3 kg)   SpO2 97%   BMI 38.04 kg/m   Physical Exam  Constitutional: She appears well-developed and well-nourished.  Cardiovascular: Normal rate, regular rhythm and normal heart sounds.  Pulmonary/Chest: Effort normal and breath sounds normal.  Musculoskeletal: She exhibits no edema.  Tender over L sternal margain  Neurological: She is alert.  Skin: Skin is warm and dry.  Psychiatric: She has a normal mood and affect. Her  behavior is normal.  Nursing note and vitals reviewed.   Assessment and Plan:  1. Chronic systolic heart failure (HCC) Stable on BB/ARB/Lasix/Aldactone/statin/asa, cards following  2. Chronic obstructive pulmonary disease, unspecified COPD type (HCC) Stable on Symbicort/albuterol/continuous O2  3. CKD (chronic kidney disease) stage 3, GFR 30-59 ml/min (HCC) Stable in Feb, avoiding NSAIDS  4. Chronic pain syndrome Pending Butrans patch per pain clinic, continue gabapentin/Tylenol tid, refer for HHPT  5. Essential hypertension Well controlled on current regimen  6. Hyperlipidemia, unspecified hyperlipidemia type Well controlled on Lipitor  7. Gastroesophageal reflux disease, esophagitis presence not specified Intolerant PPI taper  8. Primary osteoarthritis of both knees Schedule ortho f/u for cortisone injection, HHPT referral sent  9. Insomnia, unspecified type Well controlled on Remeron but gaining weight (9# since last visit, 29# since Dec), intolerant Cymbalta, consider change to trazodone  10. Vitamin D deficiency Well controlled on supplement  11. B12 deficiency  Well controlled on supplement  Return in about 1 month (around 12/08/2017).  Dionne Ano. Kingsley Spittle MD [facility]  11/10/2017

## 2017-11-16 ENCOUNTER — Telehealth: Payer: Self-pay

## 2017-11-16 ENCOUNTER — Other Ambulatory Visit: Payer: Self-pay

## 2017-11-16 NOTE — Telephone Encounter (Signed)
[name] [name] called to get approval on continuation for PT. Called [phone] and left message on vm giving the "ok" for PT

## 2017-11-19 ENCOUNTER — Telehealth: Payer: Self-pay

## 2017-11-19 NOTE — Telephone Encounter (Signed)
Bill called to get verbal on PT and OT for pt- "ok to start"

## 2017-11-22 ENCOUNTER — Ambulatory Visit (INDEPENDENT_AMBULATORY_CARE_PROVIDER_SITE_OTHER): Payer: Medicare Other | Admitting: Family Medicine

## 2017-11-22 ENCOUNTER — Encounter: Payer: Self-pay | Admitting: Family Medicine

## 2017-11-22 VITALS — BP 112/62 | HR 76 | Ht 62.0 in | Wt 210.0 lb

## 2017-11-22 DIAGNOSIS — J209 Acute bronchitis, unspecified: Secondary | ICD-10-CM

## 2017-11-22 DIAGNOSIS — M940 Chondrocostal junction syndrome [Tietze]: Secondary | ICD-10-CM

## 2017-11-22 DIAGNOSIS — J440 Chronic obstructive pulmonary disease with acute lower respiratory infection: Secondary | ICD-10-CM | POA: Diagnosis not present

## 2017-11-22 MED ORDER — IPRATROPIUM-ALBUTEROL 0.5-2.5 (3) MG/3ML IN SOLN: 3.0000 mL | Freq: Once | RESPIRATORY_TRACT | Status: DC

## 2017-11-22 MED ORDER — PREDNISONE 10 MG PO TABS: 10.0000 mg | ORAL_TABLET | Freq: Every day | ORAL | 0 refills | Status: DC

## 2017-11-22 MED ORDER — GUAIFENESIN-CODEINE 100-10 MG/5ML PO SYRP: 5.0000 mL | ORAL_SOLUTION | Freq: Three times a day (TID) | ORAL | 0 refills | Status: DC | PRN

## 2017-11-22 MED ORDER — DOXYCYCLINE HYCLATE 100 MG PO TABS: 100.0000 mg | ORAL_TABLET | Freq: Two times a day (BID) | ORAL | 0 refills | Status: DC

## 2017-11-22 NOTE — Patient Instructions (Signed)

## 2017-11-22 NOTE — Progress Notes (Signed)
Name: [name] [name]   MRN: [id number]    DOB: August 02, 1945   Date:11/22/2017       Progress Note  Subjective  Chief Complaint  Chief Complaint  Patient presents with  . Cough    coughing and wheezing, dizziness- brown color when she can get something up    Cough  This is a new problem. The current episode started yesterday. The problem has been gradually worsening. The problem occurs every few minutes. The cough is productive of purulent sputum (brownish). Associated symptoms include chest pain, ear congestion, a fever, headaches, nasal congestion, postnasal drip, shortness of breath and wheezing. Pertinent negatives include no chills, ear pain, heartburn, hemoptysis, myalgias, rash, rhinorrhea, sore throat, sweats or weight loss. The symptoms are aggravated by pollens. She has tried a beta-agonist inhaler and steroid inhaler for the symptoms. Her past medical history is significant for COPD. There is no history of environmental allergies.    No problem-specific Assessment & Plan notes found for this encounter.   Past Medical History:  Diagnosis Date  . Chronic combined systolic (congestive) and diastolic (congestive) heart failure (HCC)    a. 02/2017 Echo: EF 25-30%, Gr2 DD.  Marland Kitchen COPD (chronic obstructive pulmonary disease) (HCC)    a. On supplemental O2 @ home.  Marland Kitchen Hypertension   . Lung nodule   . Narrow complex tachycardia (HCC)    a. 02/2017 - ? NSVT vs Afib-->placed on amio.  No OAC.  Marland Kitchen NICM (nonischemic cardiomyopathy) (HCC)    a. 02/2017 Echo: Ef 25-30%, mild conc LVH, Gr2 DD, sev MR, mod dil LA;  b. 02/2016 Cath [facility]): nonobs dzs.  . Non-obstructive CAD    a. 02/2016 Cath [facility]): LM 30, LAD 20ost/p, 37m, 30d, D1 50p, D2 30, D3 20, RI 20, LCX 40p, OM1 30, OM2 20, RCA 20 diffuse, RPL2 30, RPL3 30.  Marland Kitchen Severe mitral regurgitation    a. 02/2017 Echo: Sev MR.    Past Surgical History:  Procedure Laterality Date  . REPLACEMENT TOTAL KNEE    . TYMPANOSTOMY TUBE PLACEMENT Bilateral  03/19/2017    Family History  Problem Relation Age of Onset  . Heart disease Mother   . Heart disease Sister   . Cancer Sister     Social History   Socioeconomic History  . Marital status: Legally Separated    Spouse name: Not on file  . Number of children: 2  . Years of education: Not on file  . Highest education level: 10th grade  Occupational History  . Occupation: RETIRED  Social Needs  . Financial resource strain: Not hard at all  . Food insecurity:    Worry: Never true    Inability: Never true  . Transportation needs:    Medical: Yes    Non-medical: Yes  Tobacco Use  . Smoking status: Former Smoker    Packs/day: 3.00    Years: 55.00    Pack years: 165.00    Types: Cigarettes    Last attempt to quit: 2018    Years since quitting: 1.3  . Smokeless tobacco: Never Used  . Tobacco comment: smoking cessation materials not required  Substance and Sexual Activity  . Alcohol use: No  . Drug use: No  . Sexual activity: Not Currently  Lifestyle  . Physical activity:    Days per week: 0 days    Minutes per session: 0 min  . Stress: Very much  Relationships  . Social connections:    Talks on phone: Patient refused  Gets together: Patient refused    Attends religious service: Patient refused    Active member of club or organization: Patient refused    Attends meetings of clubs or organizations: Patient refused    Relationship status: Separated  . Intimate partner violence:    Fear of current or ex partner: No    Emotionally abused: No    Physically abused: No    Forced sexual activity: No  Other Topics Concern  . Not on file  Social History Narrative  . Not on file    Allergies  Allergen Reactions  . Sulfa Antibiotics Itching    Pt states she gets "itching inside and out"  . Cymbalta [Duloxetine Hcl] Nausea Only  . Tramadol Hcl Other (See Comments)    Falls, confusion    Outpatient Medications Prior to Visit  Medication Sig Dispense Refill  .  acetaminophen (TYLENOL) 500 MG tablet Take 2 tablets (1,000 mg total) by mouth 3 (three) times daily. 30 tablet 0  . albuterol (PROVENTIL HFA;VENTOLIN HFA) 108 (90 Base) MCG/ACT inhaler Inhale 2 puffs into the lungs every 6 (six) hours as needed for wheezing or shortness of breath. 1 Inhaler 3  . albuterol (PROVENTIL) (2.5 MG/3ML) 0.083% nebulizer solution Take 3 mLs (2.5 mg total) by nebulization every 6 (six) hours as needed for wheezing or shortness of breath. 150 mL 1  . aspirin 81 MG chewable tablet Chew 81 mg by mouth daily.    Marland Kitchen atorvastatin (LIPITOR) 40 MG tablet Take 1 tablet (40 mg total) by mouth daily. 90 tablet 3  . budesonide-formoterol (SYMBICORT) 160-4.5 MCG/ACT inhaler Inhale 2 puffs into the lungs 2 (two) times daily. 1 Inhaler 2  . buprenorphine (BUTRANS - DOSED MCG/HR) 5 MCG/HR PTWK patch Pain management/ [facility]  1  . carvedilol (COREG) 25 MG tablet Take 1 tablet (25 mg total) by mouth 2 (two) times daily. 180 tablet 3  . Cholecalciferol (VITAMIN D3) 1000 units CAPS Take 1 capsule by mouth daily.     . Cyanocobalamin (B-12) 1000 MCG TABS Take 1 tablet by mouth daily. 30 tablet   . fluticasone (FLONASE) 50 MCG/ACT nasal spray Place 2 sprays into both nostrils daily.    . furosemide (LASIX) 80 MG tablet Take 1 tablet (80 mg total) by mouth daily. 225 tablet 3  . gabapentin (NEURONTIN) 600 MG tablet Take 2 tablets (1,200 mg total) by mouth 3 (three) times daily. 180 tablet 2  . ketoconazole (NIZORAL) 2 % shampoo U TO WASH SCALP 3 TIMES PER WEEK  5  . losartan (COZAAR) 25 MG tablet Take 25 mg by mouth.    Marland Kitchen omeprazole (PRILOSEC) 40 MG capsule Take 40 mg by mouth.    . OXYGEN Inhale 4 L into the lungs.     . promethazine (PHENERGAN) 25 MG tablet Take 0.5 tablets (12.5 mg total) by mouth every 6 (six) hours as needed for nausea. 30 tablet 2  . spironolactone (ALDACTONE) 25 MG tablet Take 1 tablet (25 mg total) by mouth daily. 90 tablet 3  . traZODone (DESYREL) 50 MG tablet Take 1 tablet  (50 mg total) by mouth at bedtime as needed for sleep. 30 tablet 2   No facility-administered medications prior to visit.     Review of Systems  Constitutional: Positive for fever. Negative for chills, malaise/fatigue and weight loss.  HENT: Positive for postnasal drip. Negative for ear discharge, ear pain, rhinorrhea and sore throat.   Eyes: Negative for blurred vision.  Respiratory: Positive for cough, shortness  of breath and wheezing. Negative for hemoptysis and sputum production.   Cardiovascular: Positive for chest pain. Negative for palpitations and leg swelling.  Gastrointestinal: Negative for abdominal pain, blood in stool, constipation, diarrhea, heartburn, melena and nausea.  Genitourinary: Negative for dysuria, frequency, hematuria and urgency.  Musculoskeletal: Negative for back pain, joint pain, myalgias and neck pain.  Skin: Negative for rash.  Neurological: Positive for headaches. Negative for dizziness, tingling, sensory change and focal weakness.  Endo/Heme/Allergies: Negative for environmental allergies and polydipsia. Does not bruise/bleed easily.  Psychiatric/Behavioral: Negative for depression and suicidal ideas. The patient is not nervous/anxious and does not have insomnia.      Objective  Vitals:   11/22/17 1447  BP: 112/62  Pulse: 76  SpO2: 97%  Weight: 210 lb (95.3 kg)  Height: 5\' 2"  (1.575 m)    Physical Exam  Constitutional: No distress.  HENT:  Head: Normocephalic and atraumatic.  Right Ear: External ear normal.  Left Ear: External ear normal.  Nose: Nose normal.  Mouth/Throat: Oropharynx is clear and moist.  Eyes: Pupils are equal, round, and reactive to light. Conjunctivae and EOM are normal. Right eye exhibits no discharge. Left eye exhibits no discharge.  Neck: Normal range of motion. Neck supple. No JVD present. No thyromegaly present.  Cardiovascular: Normal rate, regular rhythm, S1 normal, S2 normal, normal heart sounds and intact distal  pulses. PMI is not displaced. Exam reveals no gallop, no S3, no S4 and no friction rub.  No murmur heard. Pulmonary/Chest: Effort normal. Stridor present. No respiratory distress. She has wheezes. She has no rales. She exhibits tenderness.    Abdominal: Soft. Bowel sounds are normal. She exhibits no mass. There is no tenderness. There is no guarding.  Musculoskeletal: Normal range of motion. She exhibits no edema.  Lymphadenopathy:    She has no cervical adenopathy.  Neurological: She is alert. She has normal reflexes.  Skin: Skin is warm and dry. She is not diaphoretic.  Nursing note and vitals reviewed.     Assessment & Plan  Problem List Items Addressed This Visit      Respiratory   RESOLVED: COPD with acute bronchitis (HCC) - Primary   Relevant Medications   doxycycline (VIBRA-TABS) 100 MG tablet   predniSONE (DELTASONE) 10 MG tablet   ipratropium-albuterol (DUONEB) 0.5-2.5 (3) MG/3ML nebulizer solution 3 mL   guaiFENesin-codeine (ROBITUSSIN AC) 100-10 MG/5ML syrup    Other Visit Diagnoses    Costochondritis       Relevant Medications   predniSONE (DELTASONE) 10 MG tablet      Meds ordered this encounter  Medications  . doxycycline (VIBRA-TABS) 100 MG tablet    Sig: Take 1 tablet (100 mg total) by mouth 2 (two) times daily.    Dispense:  20 tablet    Refill:  0  . predniSONE (DELTASONE) 10 MG tablet    Sig: Take 1 tablet (10 mg total) by mouth daily with breakfast.    Dispense:  30 tablet    Refill:  0  . ipratropium-albuterol (DUONEB) 0.5-2.5 (3) MG/3ML nebulizer solution 3 mL  . guaiFENesin-codeine (ROBITUSSIN AC) 100-10 MG/5ML syrup    Sig: Take 5 mLs by mouth 3 (three) times daily as needed for cough.    Dispense:  150 mL    Refill:  0      Dr. Elizabeth Sauer [facility]  11/22/17

## 2017-11-29 ENCOUNTER — Encounter: Payer: Self-pay | Admitting: Family Medicine

## 2017-12-07 ENCOUNTER — Encounter: Payer: Self-pay | Admitting: Family Medicine

## 2017-12-07 ENCOUNTER — Telehealth: Payer: Self-pay

## 2017-12-07 ENCOUNTER — Other Ambulatory Visit
Admission: RE | Admit: 2017-12-07 | Discharge: 2017-12-07 | Disposition: A | Discharge status: Home / Self Care | Payer: Medicare Other | Source: Ambulatory Visit | Attending: Internal Medicine | Admitting: Internal Medicine

## 2017-12-07 ENCOUNTER — Ambulatory Visit (INDEPENDENT_AMBULATORY_CARE_PROVIDER_SITE_OTHER): Payer: Medicare Other | Admitting: Family Medicine

## 2017-12-07 VITALS — BP 128/80 | HR 72 | Ht 62.0 in | Wt 210.0 lb

## 2017-12-07 DIAGNOSIS — E785 Hyperlipidemia, unspecified: Secondary | ICD-10-CM | POA: Insufficient documentation

## 2017-12-07 DIAGNOSIS — I480 Paroxysmal atrial fibrillation: Secondary | ICD-10-CM

## 2017-12-07 DIAGNOSIS — R2681 Unsteadiness on feet: Secondary | ICD-10-CM

## 2017-12-07 DIAGNOSIS — J309 Allergic rhinitis, unspecified: Secondary | ICD-10-CM

## 2017-12-07 DIAGNOSIS — F5101 Primary insomnia: Secondary | ICD-10-CM

## 2017-12-07 DIAGNOSIS — J449 Chronic obstructive pulmonary disease, unspecified: Secondary | ICD-10-CM | POA: Diagnosis not present

## 2017-12-07 DIAGNOSIS — N183 Chronic kidney disease, stage 3 unspecified: Secondary | ICD-10-CM

## 2017-12-07 DIAGNOSIS — I10 Essential (primary) hypertension: Secondary | ICD-10-CM

## 2017-12-07 DIAGNOSIS — I340 Nonrheumatic mitral (valve) insufficiency: Secondary | ICD-10-CM

## 2017-12-07 DIAGNOSIS — K219 Gastro-esophageal reflux disease without esophagitis: Secondary | ICD-10-CM

## 2017-12-07 DIAGNOSIS — I5043 Acute on chronic combined systolic (congestive) and diastolic (congestive) heart failure: Secondary | ICD-10-CM | POA: Diagnosis not present

## 2017-12-07 LAB — LIPID PANEL
CHOLESTEROL: 160 mg/dL (ref 0–200)
HDL: 68 mg/dL (ref >40)
LDL CALC: 72 mg/dL (ref 0–99)
Total CHOL/HDL Ratio: 2.4 RATIO
Triglycerides: 101 mg/dL (ref <150)
VLDL: 20 mg/dL (ref 0–40)

## 2017-12-07 LAB — RENAL FUNCTION PANEL
Albumin: 4.1 g/dL (ref 3.5–5.0)
Anion gap: 14 (ref 5–15)
BUN: 31 mg/dL — AB (ref 6–20)
CALCIUM: 8.7 mg/dL — AB (ref 8.9–10.3)
CO2: 27 mmol/L (ref 22–32)
CREATININE: 1.16 mg/dL — AB (ref 0.44–1.00)
Chloride: 98 mmol/L — ABNORMAL LOW (ref 101–111)
GFR, EST AFRICAN AMERICAN: 52 mL/min — AB (ref >60)
GFR, EST NON AFRICAN AMERICAN: 45 mL/min — AB (ref >60)
Glucose, Bld: 106 mg/dL — ABNORMAL HIGH (ref 65–99)
Phosphorus: 4 mg/dL (ref 2.5–4.6)
Potassium: 4.3 mmol/L (ref 3.5–5.1)
SODIUM: 139 mmol/L (ref 135–145)

## 2017-12-07 MED ORDER — ATORVASTATIN CALCIUM 40 MG PO TABS: 40.0000 mg | ORAL_TABLET | Freq: Every day | ORAL | 1 refills | Status: AC

## 2017-12-07 MED ORDER — LOSARTAN POTASSIUM 25 MG PO TABS: 25.0000 mg | ORAL_TABLET | Freq: Every day | ORAL | 1 refills | Status: AC

## 2017-12-07 MED ORDER — BUDESONIDE-FORMOTEROL FUMARATE 160-4.5 MCG/ACT IN AERO: 2.0000 | INHALATION_SPRAY | Freq: Two times a day (BID) | RESPIRATORY_TRACT | 6 refills | Status: DC

## 2017-12-07 MED ORDER — FUROSEMIDE 80 MG PO TABS: 80.0000 mg | ORAL_TABLET | Freq: Every day | ORAL | 1 refills | Status: DC

## 2017-12-07 MED ORDER — CARVEDILOL 25 MG PO TABS: 25.0000 mg | ORAL_TABLET | Freq: Two times a day (BID) | ORAL | 3 refills | Status: AC

## 2017-12-07 MED ORDER — TRAZODONE HCL 50 MG PO TABS: 50.0000 mg | ORAL_TABLET | Freq: Every evening | ORAL | 1 refills | Status: DC | PRN

## 2017-12-07 MED ORDER — ALBUTEROL SULFATE HFA 108 (90 BASE) MCG/ACT IN AERS: 2.0000 | INHALATION_SPRAY | Freq: Four times a day (QID) | RESPIRATORY_TRACT | 6 refills | Status: AC | PRN

## 2017-12-07 MED ORDER — OMEPRAZOLE 40 MG PO CPDR: 40.0000 mg | DELAYED_RELEASE_CAPSULE | Freq: Every day | ORAL | 1 refills | Status: DC

## 2017-12-07 MED ORDER — FLUTICASONE PROPIONATE 50 MCG/ACT NA SUSP: 2.0000 | Freq: Every day | NASAL | 11 refills | Status: DC

## 2017-12-07 MED ORDER — SPIRONOLACTONE 25 MG PO TABS: 25.0000 mg | ORAL_TABLET | Freq: Every day | ORAL | 1 refills | Status: DC

## 2017-12-07 NOTE — Progress Notes (Signed)
Name: [name] [name]   MRN: [id number]    DOB: 1945/08/26   Date:12/07/2017       Progress Note  Subjective  Chief Complaint  Chief Complaint  Patient presents with  . COPD  . Hyperlipidemia  . Hypertension  . Congestive Heart Failure  . Gastroesophageal Reflux    COPD  There is no chest tightness, cough, difficulty breathing, frequent throat clearing, hemoptysis, hoarse voice, shortness of breath, sputum production or wheezing. This is a chronic problem. The current episode started more than 1 year ago. The problem occurs intermittently. The problem has been waxing and waning. Associated symptoms include dyspnea on exertion. Pertinent negatives include no appetite change, chest pain, ear congestion, ear pain, fever, headaches, heartburn, malaise/fatigue, myalgias, nasal congestion, orthopnea, PND, postnasal drip, rhinorrhea, sneezing, sore throat, sweats, trouble swallowing or weight loss. Her symptoms are aggravated by nothing. Her past medical history is significant for COPD.  Hyperlipidemia  This is a chronic problem. The current episode started more than 1 year ago. The problem is controlled. Recent lipid tests were reviewed and are normal. She has no history of chronic renal disease, diabetes, hypothyroidism, liver disease, obesity or nephrotic syndrome. There are no known factors aggravating her hyperlipidemia. Pertinent negatives include no chest pain, focal weakness, myalgias or shortness of breath. Current antihyperlipidemic treatment includes statins. There are no compliance problems.   Hypertension  This is a chronic problem. The current episode started more than 1 year ago. The problem has been waxing and waning since onset. Pertinent negatives include no anxiety, blurred vision, chest pain, headaches, malaise/fatigue, neck pain, orthopnea, palpitations, peripheral edema, PND, shortness of breath or sweats. There are no known risk factors for coronary artery disease. Past treatments  include diuretics, beta blockers and angiotensin blockers. The current treatment provides moderate improvement. There are no compliance problems.  Hypertensive end-organ damage includes heart failure. There is no history of angina, kidney disease, CAD/MI, CVA, left ventricular hypertrophy or PVD. There is no history of chronic renal disease.  Congestive Heart Failure  Presents for follow-up visit. Pertinent negatives include no abdominal pain, chest pain, chest pressure, claudication, edema, fatigue, muscle weakness, near-syncope, nocturia, orthopnea, palpitations, paroxysmal nocturnal dyspnea, shortness of breath or unexpected weight change. The symptoms have been stable. There is no history of anemia. Compliance problems include adherence to diet.  Compliance with diet is 51-75%. Compliance with exercise is 0-25%. Compliance with medications is 76-100%.  Gastroesophageal Reflux  She reports no abdominal pain, no belching, no chest pain, no choking, no coughing, no dysphagia, no globus sensation, no heartburn, no hoarse voice, no nausea, no sore throat, no stridor or no wheezing. This is a new problem. The problem has been waxing and waning. The symptoms are aggravated by certain foods. Pertinent negatives include no anemia, fatigue, melena, muscle weakness, orthopnea or weight loss.    No problem-specific Assessment & Plan notes found for this encounter.   Past Medical History:  Diagnosis Date  . Chronic combined systolic (congestive) and diastolic (congestive) heart failure (HCC)    a. 02/2017 Echo: EF 25-30%, Gr2 DD.  Marland Kitchen COPD (chronic obstructive pulmonary disease) (HCC)    a. On supplemental O2 @ home.  Marland Kitchen Hypertension   . Lung nodule   . Narrow complex tachycardia (HCC)    a. 02/2017 - ? NSVT vs Afib-->placed on amio.  No OAC.  Marland Kitchen NICM (nonischemic cardiomyopathy) (HCC)    a. 02/2017 Echo: Ef 25-30%, mild conc LVH, Gr2 DD, sev MR, mod dil LA;  b. 02/2016 Cath [facility]): nonobs dzs.  .  Non-obstructive CAD    a. 02/2016 [facility]): LM 30, LAD 20ost/p, 84m, 30d, D1 50p, D2 30, D3 20, RI 20, LCX 40p, OM1 30, OM2 20, RCA 20 diffuse, RPL2 30, RPL3 30.  Marland Kitchen Severe mitral regurgitation    a. 02/2017 Echo: Sev MR.    Past Surgical History:  Procedure Laterality Date  . REPLACEMENT TOTAL KNEE    . TYMPANOSTOMY TUBE PLACEMENT Bilateral 03/19/2017    Family History  Problem Relation Age of Onset  . Heart disease Mother   . Heart disease Sister   . Cancer Sister     Social History   Socioeconomic History  . Marital status: Legally Separated    Spouse name: Not on file  . Number of children: 2  . Years of education: Not on file  . Highest education level: 10th grade  Occupational History  . Occupation: RETIRED  Social Needs  . Financial resource strain: Not hard at all  . Food insecurity:    Worry: Never true    Inability: Never true  . Transportation needs:    Medical: Yes    Non-medical: Yes  Tobacco Use  . Smoking status: Former Smoker    Packs/day: 3.00    Years: 55.00    Pack years: 165.00    Types: Cigarettes    Last attempt to quit: 2018    Years since quitting: 1.3  . Smokeless tobacco: Never Used  . Tobacco comment: smoking cessation materials not required  Substance and Sexual Activity  . Alcohol use: No  . Drug use: No  . Sexual activity: Not Currently  Lifestyle  . Physical activity:    Days per week: 0 days    Minutes per session: 0 min  . Stress: Very much  Relationships  . Social connections:    Talks on phone: Patient refused    Gets together: Patient refused    Attends religious service: Patient refused    Active member of club or organization: Patient refused    Attends meetings of clubs or organizations: Patient refused    Relationship status: Separated  . Intimate partner violence:    Fear of current or ex partner: No    Emotionally abused: No    Physically abused: No    Forced sexual activity: No  Other Topics Concern  . Not  on file  Social History Narrative  . Not on file    Allergies  Allergen Reactions  . Sulfa Antibiotics Itching    Pt states she gets "itching inside and out"  . Cymbalta [Duloxetine Hcl] Nausea Only  . Tramadol Hcl Other (See Comments)    Falls, confusion    Outpatient Medications Prior to Visit  Medication Sig Dispense Refill  . acetaminophen (TYLENOL) 500 MG tablet Take 2 tablets (1,000 mg total) by mouth 3 (three) times daily. 30 tablet 0  . albuterol (PROVENTIL) (2.5 MG/3ML) 0.083% nebulizer solution Take 3 mLs (2.5 mg total) by nebulization every 6 (six) hours as needed for wheezing or shortness of breath. 150 mL 1  . aspirin 81 MG chewable tablet Chew 81 mg by mouth daily.    . Cholecalciferol (VITAMIN D3) 1000 units CAPS Take 1 capsule by mouth daily.     . Cyanocobalamin (B-12) 1000 MCG TABS Take 1 tablet by mouth daily. 30 tablet   . ketoconazole (NIZORAL) 2 % shampoo U TO WASH SCALP 3 TIMES PER WEEK  5  . OXYGEN Inhale 4  L into the lungs.     . predniSONE (DELTASONE) 10 MG tablet Take 1 tablet (10 mg total) by mouth daily with breakfast. 30 tablet 0  . promethazine (PHENERGAN) 25 MG tablet Take 0.5 tablets (12.5 mg total) by mouth every 6 (six) hours as needed for nausea. 30 tablet 2  . albuterol (PROVENTIL HFA;VENTOLIN HFA) 108 (90 Base) MCG/ACT inhaler Inhale 2 puffs into the lungs every 6 (six) hours as needed for wheezing or shortness of breath. 1 Inhaler 3  . atorvastatin (LIPITOR) 40 MG tablet Take 1 tablet (40 mg total) by mouth daily. 90 tablet 3  . budesonide-formoterol (SYMBICORT) 160-4.5 MCG/ACT inhaler Inhale 2 puffs into the lungs 2 (two) times daily. 1 Inhaler 2  . carvedilol (COREG) 25 MG tablet Take 1 tablet (25 mg total) by mouth 2 (two) times daily. 180 tablet 3  . fluticasone (FLONASE) 50 MCG/ACT nasal spray Place 2 sprays into both nostrils daily.    . furosemide (LASIX) 80 MG tablet Take 1 tablet (80 mg total) by mouth daily. 225 tablet 3  . gabapentin  (NEURONTIN) 600 MG tablet Take 2 tablets (1,200 mg total) by mouth 3 (three) times daily. 180 tablet 2  . losartan (COZAAR) 25 MG tablet Take 25 mg by mouth.    Marland Kitchen omeprazole (PRILOSEC) 40 MG capsule Take 40 mg by mouth.    . spironolactone (ALDACTONE) 25 MG tablet Take 1 tablet (25 mg total) by mouth daily. 90 tablet 3  . traZODone (DESYREL) 50 MG tablet Take 1 tablet (50 mg total) by mouth at bedtime as needed for sleep. 30 tablet 2  . buprenorphine (BUTRANS - DOSED MCG/HR) 5 MCG/HR PTWK patch Pain management/ [facility]  1  . doxycycline (VIBRA-TABS) 100 MG tablet Take 1 tablet (100 mg total) by mouth 2 (two) times daily. 20 tablet 0  . guaiFENesin-codeine (ROBITUSSIN AC) 100-10 MG/5ML syrup Take 5 mLs by mouth 3 (three) times daily as needed for cough. 150 mL 0   Facility-Administered Medications Prior to Visit  Medication Dose Route Frequency Provider Last Rate Last Dose  . ipratropium-albuterol (DUONEB) 0.5-2.5 (3) MG/3ML nebulizer solution 3 mL  3 mL Nebulization Once Duanne Limerick, MD        Review of Systems  Constitutional: Negative for appetite change, chills, fatigue, fever, malaise/fatigue, unexpected weight change and weight loss.  HENT: Negative for ear discharge, ear pain, hoarse voice, postnasal drip, rhinorrhea, sneezing, sore throat and trouble swallowing.   Eyes: Negative for blurred vision.  Respiratory: Negative for cough, hemoptysis, sputum production, choking, shortness of breath and wheezing.   Cardiovascular: Positive for dyspnea on exertion. Negative for chest pain, palpitations, orthopnea, claudication, leg swelling, PND and near-syncope.  Gastrointestinal: Negative for abdominal pain, blood in stool, constipation, diarrhea, dysphagia, heartburn, melena and nausea.  Genitourinary: Negative for dysuria, frequency, hematuria, nocturia and urgency.  Musculoskeletal: Negative for back pain, joint pain, myalgias, muscle weakness and neck pain.  Skin: Negative for rash.   Neurological: Negative for dizziness, tingling, sensory change, focal weakness and headaches.  Endo/Heme/Allergies: Negative for environmental allergies and polydipsia. Does not bruise/bleed easily.  Psychiatric/Behavioral: Negative for depression and suicidal ideas. The patient is not nervous/anxious and does not have insomnia.      Objective  Vitals:   12/07/17 1416  BP: 128/80  Pulse: 72  Weight: 210 lb (95.3 kg)  Height: 5\' 2"  (1.575 m)    Physical Exam  Constitutional: She is oriented to person, place, and time. She appears well-developed and well-nourished.  HENT:  Head: Normocephalic.  Right Ear: External ear normal.  Left Ear: External ear normal.  Mouth/Throat: Oropharynx is clear and moist.  Eyes: Pupils are equal, round, and reactive to light. Conjunctivae and EOM are normal. Lids are everted and swept, no foreign bodies found. Left eye exhibits no hordeolum. No foreign body present in the left eye. Right conjunctiva is not injected. Left conjunctiva is not injected. No scleral icterus.  Neck: Normal range of motion. Neck supple. No JVD present. No tracheal deviation present. No thyromegaly present.  Cardiovascular: Normal rate, regular rhythm, normal heart sounds and intact distal pulses. Exam reveals no gallop and no friction rub.  No murmur heard. Pulmonary/Chest: Effort normal and breath sounds normal. No respiratory distress. She has no wheezes. She has no rales.  Abdominal: Soft. Bowel sounds are normal. She exhibits no mass. There is no hepatosplenomegaly. There is no tenderness. There is no rebound and no guarding.  Musculoskeletal: Normal range of motion. She exhibits no edema or tenderness.  Lymphadenopathy:    She has no cervical adenopathy.  Neurological: She is alert and oriented to person, place, and time. She has normal strength. She displays normal reflexes. No cranial nerve deficit.  Skin: Skin is warm. No rash noted.  Psychiatric: She has a normal mood  and affect. Her mood appears not anxious. She does not exhibit a depressed mood.  Nursing note and vitals reviewed.     Assessment & Plan  Problem List Items Addressed This Visit      Cardiovascular and Mediastinum   HTN (hypertension) (Chronic)   Relevant Medications   furosemide (LASIX) 80 MG tablet   atorvastatin (LIPITOR) 40 MG tablet   spironolactone (ALDACTONE) 25 MG tablet   carvedilol (COREG) 25 MG tablet   losartan (COZAAR) 25 MG tablet   RESOLVED: Acute on chronic combined systolic and diastolic CHF (congestive heart failure) (HCC) - Primary   Relevant Medications   furosemide (LASIX) 80 MG tablet   atorvastatin (LIPITOR) 40 MG tablet   spironolactone (ALDACTONE) 25 MG tablet   carvedilol (COREG) 25 MG tablet   losartan (COZAAR) 25 MG tablet   Severe mitral regurgitation   Relevant Medications   furosemide (LASIX) 80 MG tablet   atorvastatin (LIPITOR) 40 MG tablet   spironolactone (ALDACTONE) 25 MG tablet   carvedilol (COREG) 25 MG tablet   losartan (COZAAR) 25 MG tablet     Respiratory   Allergic rhinitis   Relevant Medications   fluticasone (FLONASE) 50 MCG/ACT nasal spray   COPD (chronic obstructive pulmonary disease) (HCC)   Relevant Medications   fluticasone (FLONASE) 50 MCG/ACT nasal spray   albuterol (PROVENTIL HFA;VENTOLIN HFA) 108 (90 Base) MCG/ACT inhaler   budesonide-formoterol (SYMBICORT) 160-4.5 MCG/ACT inhaler     Digestive   GERD (gastroesophageal reflux disease)   Relevant Medications   omeprazole (PRILOSEC) 40 MG capsule     Genitourinary   CKD (chronic kidney disease) stage 3, GFR 30-59 ml/min (HCC)   Relevant Orders   Renal Function Panel     Other   Gait instability   Hyperlipidemia   Relevant Medications   furosemide (LASIX) 80 MG tablet   atorvastatin (LIPITOR) 40 MG tablet   spironolactone (ALDACTONE) 25 MG tablet   carvedilol (COREG) 25 MG tablet   losartan (COZAAR) 25 MG tablet   Other Relevant Orders   Lipid panel    Insomnia   Relevant Medications   traZODone (DESYREL) 50 MG tablet    Other Visit Diagnoses    Paroxysmal  atrial fibrillation (HCC)   (Chronic)     Relevant Medications   furosemide (LASIX) 80 MG tablet   atorvastatin (LIPITOR) 40 MG tablet   spironolactone (ALDACTONE) 25 MG tablet   carvedilol (COREG) 25 MG tablet   losartan (COZAAR) 25 MG tablet      Meds ordered this encounter  Medications  . furosemide (LASIX) 80 MG tablet    Sig: Take 1 tablet (80 mg total) by mouth daily.    Dispense:  90 tablet    Refill:  1  . traZODone (DESYREL) 50 MG tablet    Sig: Take 1 tablet (50 mg total) by mouth at bedtime as needed for sleep.    Dispense:  90 tablet    Refill:  1  . atorvastatin (LIPITOR) 40 MG tablet    Sig: Take 1 tablet (40 mg total) by mouth daily.    Dispense:  90 tablet    Refill:  1  . omeprazole (PRILOSEC) 40 MG capsule    Sig: Take 1 capsule (40 mg total) by mouth daily.    Dispense:  90 capsule    Refill:  1  . spironolactone (ALDACTONE) 25 MG tablet    Sig: Take 1 tablet (25 mg total) by mouth daily.    Dispense:  90 tablet    Refill:  1  . carvedilol (COREG) 25 MG tablet    Sig: Take 1 tablet (25 mg total) by mouth 2 (two) times daily.    Dispense:  180 tablet    Refill:  3  . losartan (COZAAR) 25 MG tablet    Sig: Take 1 tablet (25 mg total) by mouth daily.    Dispense:  90 tablet    Refill:  1  . fluticasone (FLONASE) 50 MCG/ACT nasal spray    Sig: Place 2 sprays into both nostrils daily.    Dispense:  16 g    Refill:  11  . albuterol (PROVENTIL HFA;VENTOLIN HFA) 108 (90 Base) MCG/ACT inhaler    Sig: Inhale 2 puffs into the lungs every 6 (six) hours as needed for wheezing or shortness of breath.    Dispense:  1 Inhaler    Refill:  6  . budesonide-formoterol (SYMBICORT) 160-4.5 MCG/ACT inhaler    Sig: Inhale 2 puffs into the lungs 2 (two) times daily.    Dispense:  1 Inhaler    Refill:  6   I spent 45 minutes with this patient, More than 50% of  that time was spent in face to face education, counseling and care coordination.   Dr. Hayden [facility]  12/07/17

## 2017-12-07 NOTE — Telephone Encounter (Signed)
Sandy from [facility] called asking for verbal for home health aide twice a week for pt

## 2017-12-12 ENCOUNTER — Encounter: Payer: Self-pay | Admitting: Family Medicine

## 2017-12-13 ENCOUNTER — Other Ambulatory Visit: Payer: Self-pay

## 2017-12-13 DIAGNOSIS — R110 Nausea: Secondary | ICD-10-CM

## 2017-12-13 DIAGNOSIS — F5101 Primary insomnia: Secondary | ICD-10-CM

## 2017-12-13 MED ORDER — TRAZODONE HCL 50 MG PO TABS: 50.0000 mg | ORAL_TABLET | Freq: Every evening | ORAL | 0 refills | Status: DC | PRN

## 2017-12-24 ENCOUNTER — Other Ambulatory Visit: Payer: Self-pay

## 2017-12-24 ENCOUNTER — Encounter: Payer: Self-pay | Admitting: Family Medicine

## 2017-12-24 DIAGNOSIS — R69 Illness, unspecified: Secondary | ICD-10-CM

## 2017-12-24 DIAGNOSIS — G4700 Insomnia, unspecified: Secondary | ICD-10-CM

## 2017-12-28 ENCOUNTER — Encounter: Payer: Self-pay | Admitting: Family Medicine

## 2017-12-28 ENCOUNTER — Telehealth: Payer: Self-pay

## 2017-12-28 NOTE — Telephone Encounter (Signed)
Patient's daughter-n-law called wanting a return call to [facility] (pt). I returned the call and pt wanted to know "Why does Dr. Yetta Barre not want to see me?" I explained to [facility] that it wasn't that Dr Yetta Barre didn't want to take care of her, but that she is on some meds that Dr Yetta Barre does not feel comfortable prescribing for her. She said, "Tylenol and Gabapentin don't help me." I explained to her that the only medications left that she hasn't tried are controlled meds. Dr. Yetta Barre will not prescribe them because with the dizziness that she complained of, adding a controlled med only increases chance for falls. She wanted the med to help at night, "what difference does it make if I'm in the bed?" I explained to her that "If you have to get up in the middle of the night to go to the bathroom and the medicine has not had a chance to get out of your system, you may fall, resulting in a hip fracture." That is why we cannot prescribe those medicines. She said, "I'm going to go back to [place] and see my doctors, these doctors up here are just stupid." I did wish her well and apologized that we could not meet her expectations. She handed the phone to [place] and they hung up.

## 2017-12-29 ENCOUNTER — Ambulatory Visit
Admission: EM | Admit: 2017-12-29 | Discharge: 2017-12-29 | Disposition: A | Discharge status: Home / Self Care | Payer: Medicare Other | Attending: Family Medicine | Admitting: Family Medicine

## 2017-12-29 ENCOUNTER — Telehealth: Payer: Self-pay

## 2017-12-29 ENCOUNTER — Other Ambulatory Visit: Payer: Self-pay

## 2017-12-29 ENCOUNTER — Ambulatory Visit: Payer: Medicare Other

## 2017-12-29 DIAGNOSIS — R0789 Other chest pain: Secondary | ICD-10-CM | POA: Diagnosis not present

## 2017-12-29 DIAGNOSIS — E785 Hyperlipidemia, unspecified: Secondary | ICD-10-CM | POA: Insufficient documentation

## 2017-12-29 DIAGNOSIS — Z79899 Other long term (current) drug therapy: Secondary | ICD-10-CM | POA: Insufficient documentation

## 2017-12-29 DIAGNOSIS — G8929 Other chronic pain: Secondary | ICD-10-CM | POA: Insufficient documentation

## 2017-12-29 DIAGNOSIS — K219 Gastro-esophageal reflux disease without esophagitis: Secondary | ICD-10-CM | POA: Insufficient documentation

## 2017-12-29 DIAGNOSIS — I5042 Chronic combined systolic (congestive) and diastolic (congestive) heart failure: Secondary | ICD-10-CM | POA: Diagnosis not present

## 2017-12-29 DIAGNOSIS — I129 Hypertensive chronic kidney disease with stage 1 through stage 4 chronic kidney disease, or unspecified chronic kidney disease: Secondary | ICD-10-CM | POA: Diagnosis not present

## 2017-12-29 DIAGNOSIS — J441 Chronic obstructive pulmonary disease with (acute) exacerbation: Secondary | ICD-10-CM | POA: Diagnosis not present

## 2017-12-29 DIAGNOSIS — F329 Major depressive disorder, single episode, unspecified: Secondary | ICD-10-CM | POA: Insufficient documentation

## 2017-12-29 DIAGNOSIS — E538 Deficiency of other specified B group vitamins: Secondary | ICD-10-CM | POA: Insufficient documentation

## 2017-12-29 DIAGNOSIS — I482 Chronic atrial fibrillation: Secondary | ICD-10-CM | POA: Insufficient documentation

## 2017-12-29 DIAGNOSIS — Z7982 Long term (current) use of aspirin: Secondary | ICD-10-CM | POA: Diagnosis not present

## 2017-12-29 DIAGNOSIS — I340 Nonrheumatic mitral (valve) insufficiency: Secondary | ICD-10-CM | POA: Diagnosis not present

## 2017-12-29 DIAGNOSIS — Z7951 Long term (current) use of inhaled steroids: Secondary | ICD-10-CM | POA: Insufficient documentation

## 2017-12-29 DIAGNOSIS — M5136 Other intervertebral disc degeneration, lumbar region: Secondary | ICD-10-CM | POA: Insufficient documentation

## 2017-12-29 DIAGNOSIS — G4700 Insomnia, unspecified: Secondary | ICD-10-CM | POA: Diagnosis not present

## 2017-12-29 DIAGNOSIS — M940 Chondrocostal junction syndrome [Tietze]: Secondary | ICD-10-CM

## 2017-12-29 DIAGNOSIS — E559 Vitamin D deficiency, unspecified: Secondary | ICD-10-CM | POA: Diagnosis not present

## 2017-12-29 DIAGNOSIS — N183 Chronic kidney disease, stage 3 (moderate): Secondary | ICD-10-CM | POA: Diagnosis not present

## 2017-12-29 DIAGNOSIS — Z87891 Personal history of nicotine dependence: Secondary | ICD-10-CM | POA: Diagnosis not present

## 2017-12-29 DIAGNOSIS — I428 Other cardiomyopathies: Secondary | ICD-10-CM | POA: Insufficient documentation

## 2017-12-29 DIAGNOSIS — I2510 Atherosclerotic heart disease of native coronary artery without angina pectoris: Secondary | ICD-10-CM | POA: Diagnosis not present

## 2017-12-29 MED ORDER — PREDNISONE 20 MG PO TABS: ORAL_TABLET | ORAL | 0 refills | Status: DC

## 2017-12-29 NOTE — Telephone Encounter (Signed)
Was speaking with Daughter inlaw and patient was on other end wanting a refill on her Rx Remeron 15 mg. Patient was changed to Trazodone 50 mg. Patient wants refill on Remeron but Dr. Yetta Barre is not wanting to prescribe that medication. Patient wants to see another provider to get Rx but in the meantime wants her to refill this med.

## 2017-12-29 NOTE — ED Triage Notes (Signed)
Pt reports she started with chest pain after lunch. Has been having congestion starting yesterday. Pain 10/10. Pt states she was feeling SOB today as well.

## 2017-12-29 NOTE — ED Provider Notes (Signed)
[facility]    CSN: [id number] Arrival date & time: 12/29/17  1940     History   Chief Complaint Chief Complaint  Patient presents with  . Chest Pain    HPI [name] [name] is a 72 y.o. female.   72 yo female with a h/o COPD presents with a c/o shortness of breath, cough and "sharp" chest pains that are worse with deep breathing and movement. Denies any fevers or chills. States has had some wheezing as well. Has been using nebulizer and inhalers at home.   The history is provided by the patient.  Chest Pain    Past Medical History:  Diagnosis Date  . Chronic combined systolic (congestive) and diastolic (congestive) heart failure (HCC)    a. 02/2017 Echo: EF 25-30%, Gr2 DD.  Marland Kitchen COPD (chronic obstructive pulmonary disease) (HCC)    a. On supplemental O2 @ home.  Marland Kitchen Hypertension   . Lung nodule   . Narrow complex tachycardia (HCC)    a. 02/2017 - ? NSVT vs Afib-->placed on amio.  No OAC.  Marland Kitchen NICM (nonischemic cardiomyopathy) (HCC)    a. 02/2017 Echo: Ef 25-30%, mild conc LVH, Gr2 DD, sev MR, mod dil LA;  b. 02/2016 Cath [facility]): nonobs dzs.  . Non-obstructive CAD    a. 02/2016 Cath [facility]): LM 30, LAD 20ost/p, 78m, 30d, D1 50p, D2 30, D3 20, RI 20, LCX 40p, OM1 30, OM2 20, RCA 20 diffuse, RPL2 30, RPL3 30.  Marland Kitchen Severe mitral regurgitation    a. 02/2017 Echo: Sev MR.    Patient Active Problem List   Diagnosis Date Noted  . Severe mitral regurgitation 11/10/2017  . OA (osteoarthritis) of knee 11/10/2017  . CKD (chronic kidney disease) stage 3, GFR 30-59 ml/min (HCC) 08/20/2017  . Insomnia 07/22/2017  . Hyperlipidemia 07/01/2017  . DDD (degenerative disc disease), lumbar 04/30/2017  . B12 deficiency 04/30/2017  . Gait instability 03/31/2017  . Allergic rhinitis 03/31/2017  . Nausea 03/31/2017  . GERD (gastroesophageal reflux disease) 03/31/2017  . Chronic systolic heart failure (HCC) 03/24/2017  . HTN (hypertension) 03/24/2017  . COPD (chronic obstructive pulmonary  disease) (HCC) 03/24/2017  . Chronic pain 03/24/2017  . MRSA carrier 03/18/2017  . History of ventricular tachycardia 03/18/2017  . Vitamin D deficiency 01/06/2017  . Chronic atrial fibrillation (HCC) 06/15/2016  . Depression 06/15/2016  . Varicose veins of lower extremity with inflammation 09/07/2011    Past Surgical History:  Procedure Laterality Date  . REPLACEMENT TOTAL KNEE    . TYMPANOSTOMY TUBE PLACEMENT Bilateral 03/19/2017    OB History   None      Home Medications    Prior to Admission medications   Medication Sig Start Date End Date Taking? Authorizing Provider  acetaminophen (TYLENOL) 500 MG tablet Take 2 tablets (1,000 mg total) by mouth 3 (three) times daily. 08/02/17   Plonk, Chrissie Noa, MD  albuterol (PROVENTIL HFA;VENTOLIN HFA) 108 (90 Base) MCG/ACT inhaler Inhale 2 puffs into the lungs every 6 (six) hours as needed for wheezing or shortness of breath. 12/07/17   Duanne Limerick, MD  albuterol (PROVENTIL) (2.5 MG/3ML) 0.083% nebulizer solution Take 3 mLs (2.5 mg total) by nebulization every 6 (six) hours as needed for wheezing or shortness of breath. 09/01/17   Plonk, Chrissie Noa, MD  aspirin 81 MG chewable tablet Chew 81 mg by mouth daily.    [provider]  atorvastatin (LIPITOR) 40 MG tablet Take 1 tablet (40 mg total) by mouth daily. 12/07/17  Duanne Limerick, MD  budesonide-formoterol [facility]) 160-4.5 MCG/ACT inhaler Inhale 2 puffs into the lungs 2 (two) times daily. 12/07/17   Duanne Limerick, MD  buprenorphine Lavera Guise - DOSED MCG/HR) 5 MCG/HR PTWK patch Pain management/ [facility] 11/09/17   [provider]  carvedilol (COREG) 25 MG tablet Take 1 tablet (25 mg total) by mouth 2 (two) times daily. 12/07/17   Duanne Limerick, MD  Cholecalciferol (VITAMIN D3) 1000 units CAPS Take 1 capsule by mouth daily.  07/24/17 07/24/18  [provider]  Cyanocobalamin (B-12) 1000 MCG TABS Take 1 tablet by mouth daily. 04/05/17   Plonk, Chrissie Noa, MD  fluticasone  (FLONASE) 50 MCG/ACT nasal spray Place 2 sprays into both nostrils daily. 12/07/17   Duanne Limerick, MD  furosemide (LASIX) 80 MG tablet Take 1 tablet (80 mg total) by mouth daily. 12/07/17   Duanne Limerick, MD  ketoconazole (NIZORAL) 2 % shampoo U TO [facility] SCALP 3 TIMES PER WEEK 08/30/17   [provider]  losartan (COZAAR) 25 MG tablet Take 1 tablet (25 mg total) by mouth daily. 12/07/17   Duanne Limerick, MD  omeprazole (PRILOSEC) 40 MG capsule Take 1 capsule (40 mg total) by mouth daily. 12/07/17 12/07/18  Duanne Limerick, MD  OXYGEN Inhale 4 L into the lungs.     [provider]  predniSONE (DELTASONE) 20 MG tablet 2 tabs po qd x 3 days, then 1 tabs po qd x 2 days, then half a tab po qd x 2 days 12/29/17   Payton Mccallum, MD  promethazine (PHENERGAN) 25 MG tablet Take 0.5 tablets (12.5 mg total) by mouth every 6 (six) hours as needed for nausea. 08/16/17   Plonk, Chrissie Noa, MD  spironolactone (ALDACTONE) 25 MG tablet Take 1 tablet (25 mg total) by mouth daily. 12/07/17   Duanne Limerick, MD  traZODone (DESYREL) 50 MG tablet Take 1 tablet (50 mg total) by mouth at bedtime as needed for sleep. 12/13/17   Duanne Limerick, MD    Family History Family History  Problem Relation Age of Onset  . Heart disease Mother   . Heart disease Sister   . Cancer Sister     Social History Social History   Tobacco Use  . Smoking status: Former Smoker    Packs/day: 3.00    Years: 55.00    Pack years: 165.00    Types: Cigarettes    Last attempt to quit: 2018    Years since quitting: 1.4  . Smokeless tobacco: Never Used  . Tobacco comment: smoking cessation materials not required  Substance Use Topics  . Alcohol use: No  . Drug use: No     Allergies   Sulfa antibiotics; Cymbalta [duloxetine hcl]; and Tramadol hcl   Review of Systems Review of Systems  Cardiovascular: Positive for chest pain.     Physical Exam Triage Vital Signs ED Triage Vitals  Enc Vitals Group     BP 12/29/17  1954 (!) 112/55     Pulse Rate 12/29/17 1954 71     Resp 12/29/17 1954 (!) 24     Temp 12/29/17 1954 97.9 F (36.6 C)     Temp Source 12/29/17 1954 Oral     SpO2 12/29/17 1954 100 %     Weight 12/29/17 1951 204 lb (92.5 kg)     Height 12/29/17 1951 5\' 2"  (1.575 m)     Head Circumference --      Peak Flow --  Pain Score 12/29/17 1951 10     Pain Loc --      Pain Edu? --      Excl. in GC? --    No data found.  Updated Vital Signs BP (!) 112/55 (BP Location: Right Arm)   Pulse 71   Temp 97.9 F (36.6 C) (Oral)   Resp (!) 24   Ht 5\' 2"  (1.575 m)   Wt 204 lb (92.5 kg)   SpO2 100%   BMI 37.31 kg/m   Visual Acuity Right Eye Distance:   Left Eye Distance:   Bilateral Distance:    Right Eye Near:   Left Eye Near:    Bilateral Near:     Physical Exam  Constitutional: She appears well-developed and well-nourished. No distress.  HENT:  Head: Normocephalic and atraumatic.  Nose: Rhinorrhea present. No mucosal edema, nose lacerations, sinus tenderness, nasal deformity, septal deviation or nasal septal hematoma. No epistaxis.  No foreign bodies. Right sinus exhibits no maxillary sinus tenderness and no frontal sinus tenderness. Left sinus exhibits no maxillary sinus tenderness and no frontal sinus tenderness.  Mouth/Throat: Uvula is midline, oropharynx is clear and moist and mucous membranes are normal. No oropharyngeal exudate.  Eyes: Pupils are equal, round, and reactive to light. Conjunctivae and EOM are normal. Right eye exhibits no discharge. Left eye exhibits no discharge. No scleral icterus.  Neck: Normal range of motion. Neck supple. No thyromegaly present.  Cardiovascular: Normal rate, regular rhythm and normal heart sounds.  Pulmonary/Chest: Effort normal. No stridor. No respiratory distress. She has no wheezes. She has no rales. She exhibits tenderness (reproducible).  Decrease breath sounds throughout  Lymphadenopathy:    She has no cervical adenopathy.  Skin: She  is not diaphoretic.  Nursing note and vitals reviewed.    UC Treatments / Results  Labs (all labs ordered are listed, but only abnormal results are displayed) Labs Reviewed - No data to display  EKG None  Radiology No results found.  Procedures ED EKG Date/Time: 12/29/2017 9:03 PM Performed by: Payton Mccallum, MD Authorized by: Payton Mccallum, MD   ECG reviewed by ED Physician in the absence of a cardiologist: yes   Previous ECG:    Previous ECG:  Compared to current   Similarity:  No change Interpretation:    Interpretation: abnormal   Rate:    ECG rate assessment: normal   Rhythm:    Rhythm: sinus rhythm   Ectopy:    Ectopy: none   QRS:    QRS axis:  Normal Conduction:    Conduction: normal   ST segments:    ST segments:  Normal T waves:    T waves: normal   Q waves:    Q waves:  V1   (including critical care time)  Medications Ordered in UC Medications - No data to display  Initial Impression / Assessment and Plan / UC Course  I have reviewed the triage vital signs and the nursing notes.  Pertinent labs & imaging results that were available during my care of the patient were reviewed by me and considered in my medical decision making (see chart for details).      Final Clinical Impressions(s) / UC Diagnoses   Final diagnoses:  COPD exacerbation (HCC)  Chest wall pain  Costochondritis   Discharge Instructions   None    ED Prescriptions    Medication Sig Dispense Auth. Provider   predniSONE (DELTASONE) 20 MG tablet 2 tabs po qd x 3 days, then 1  tabs po qd x 2 days, then half a tab po qd x 2 days 9 tablet Olita Takeshita, Pamala Hurry, MD     1. ekg/x-ray results and diagnosis reviewed with patient 2. rx as per orders above; reviewed possible side effects, interactions, risks and benefits  3. Recommend continue current home chronic medications 4. Follow-up prn if symptoms worsen or don't improve   Controlled Substance Prescriptions [place] Controlled  Substance Registry consulted? Not Applicable   Payton Mccallum, MD 01/05/18 [phone]

## 2017-12-30 NOTE — Telephone Encounter (Signed)
Spoke to pt concerning the issue of not taking something for sleep other than the Trazodone that was prescribed for her on May 20th. I offered to call the pharmacy to see "why the pharmacy is not filling her medicine." Quentin Ore medicine is not working." I explained that I "needed to talk to [place] as long as she was able to communicate about her medications and understand." I then explained to [place], Brunetta Jeans, that Dr Yetta Barre would prescribe the Trazodone until she could get in with another doctor. She told me she didn't know any other doctors around here. I gave her [facility], told her it was in the [place] parking lot. Also told her about [facility], told her they were located on [address]. I again apologized that we can't prescribe multiple meds for her that will increase risk for falls. The 2 hung up.

## 2018-01-01 ENCOUNTER — Encounter: Payer: Self-pay | Admitting: Gastroenterology

## 2018-01-03 ENCOUNTER — Ambulatory Visit: Payer: Medicare Other | Admitting: Gastroenterology

## 2018-01-10 ENCOUNTER — Other Ambulatory Visit: Payer: Self-pay

## 2018-01-25 ENCOUNTER — Other Ambulatory Visit: Payer: Self-pay

## 2018-02-27 IMAGING — DX DG CHEST 1V PORT
1 series · 1 of 1 positions shown · non-contrast
Comparison: 11/27/2014

CLINICAL DATA: 70-year-old with respiratory distress.

EXAM:
PORTABLE CHEST 1 VIEW

[chest ap]
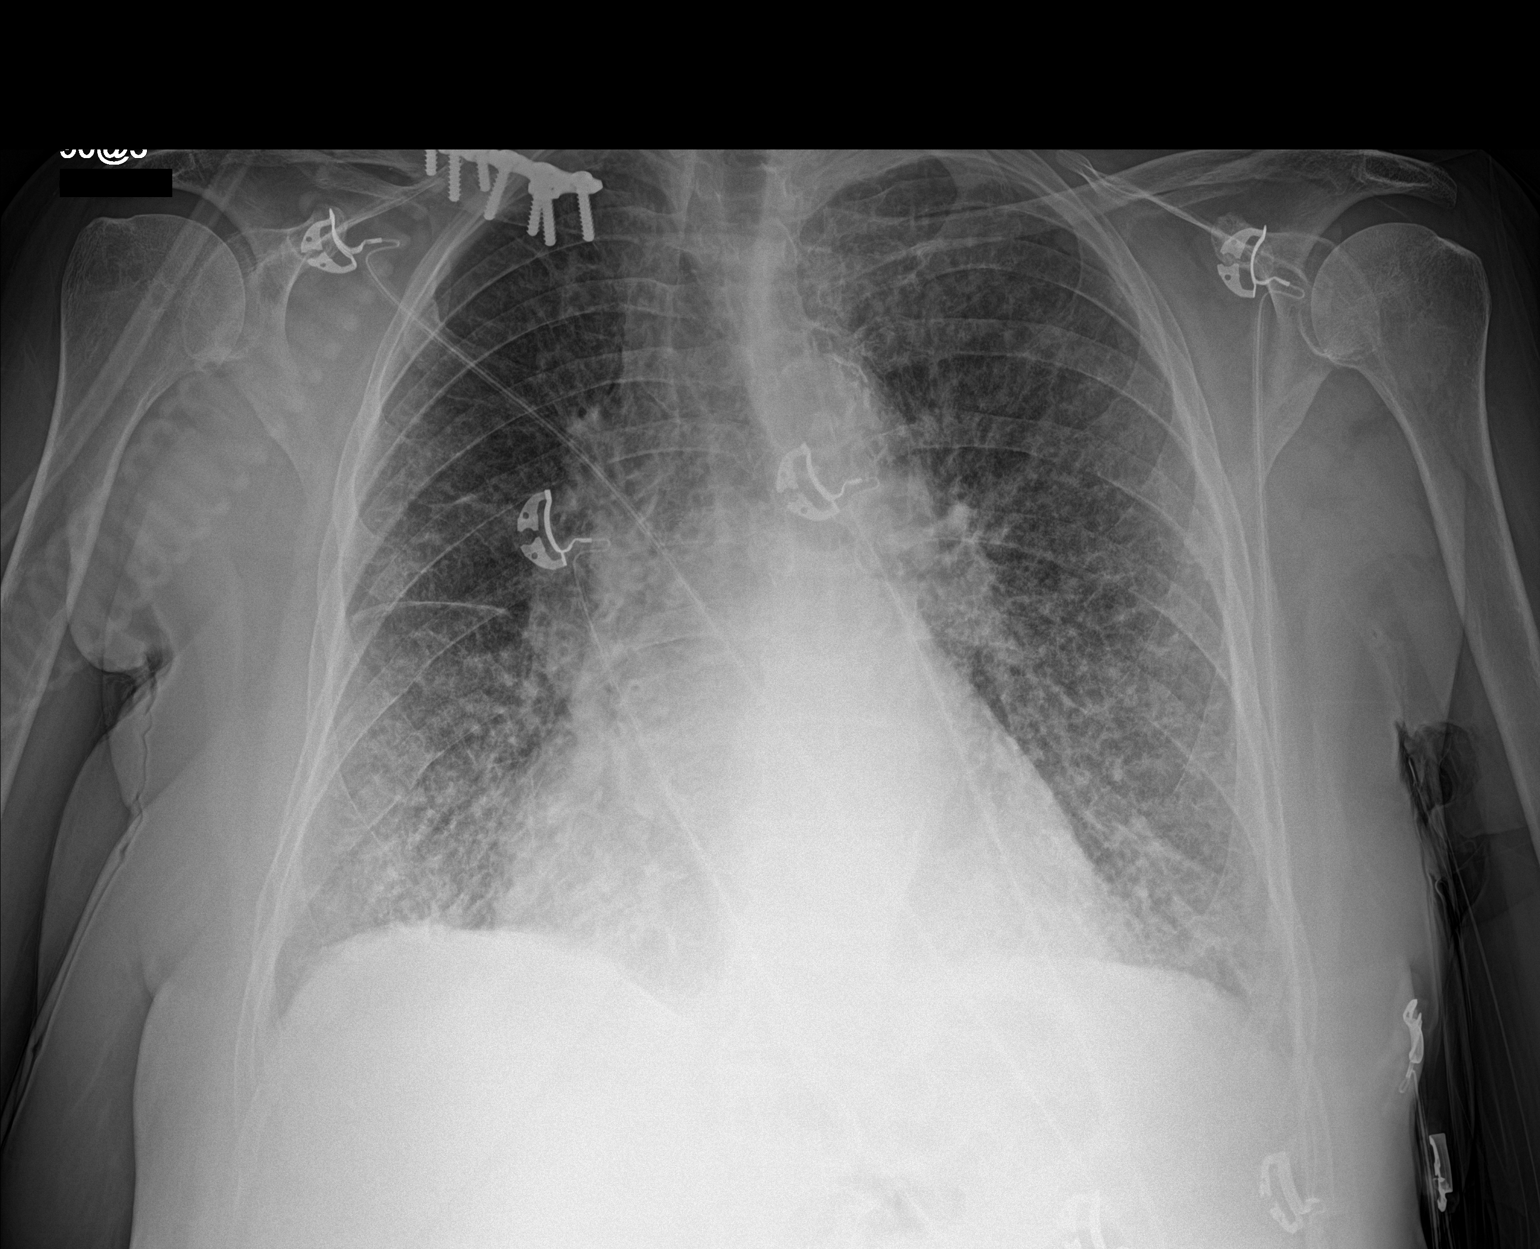

[1 of 1 positions shown; findings below may reference images not displayed]

FINDINGS: There is diffuse interstitial thickening throughout both lungs. No
focal areas of consolidation. Heart size is mildly enlarged but
stable. Atherosclerotic calcifications at the aortic arch. Surgical
plate in the right clavicle. Negative for a pneumothorax. Trachea is
midline. There is stable scarring at the left lung apex.
IMPRESSION: Diffuse enlargement of interstitial markings in both lungs. Findings
are most suggestive for interstitial pulmonary edema. Atypical
pneumonia would be in the differential.

## 2018-04-04 ENCOUNTER — Other Ambulatory Visit: Payer: Self-pay

## 2018-04-04 ENCOUNTER — Emergency Department: Payer: Medicare Other

## 2018-04-04 ENCOUNTER — Emergency Department
Admission: EM | Admit: 2018-04-04 | Discharge: 2018-04-05 | Disposition: A | Discharge status: Home / Self Care | Payer: Medicare Other | Attending: Emergency Medicine | Admitting: Emergency Medicine

## 2018-04-04 ENCOUNTER — Encounter: Payer: Self-pay | Admitting: Emergency Medicine

## 2018-04-04 DIAGNOSIS — Z87891 Personal history of nicotine dependence: Secondary | ICD-10-CM | POA: Diagnosis not present

## 2018-04-04 DIAGNOSIS — J441 Chronic obstructive pulmonary disease with (acute) exacerbation: Secondary | ICD-10-CM | POA: Insufficient documentation

## 2018-04-04 DIAGNOSIS — Z79899 Other long term (current) drug therapy: Secondary | ICD-10-CM | POA: Diagnosis not present

## 2018-04-04 DIAGNOSIS — I5042 Chronic combined systolic (congestive) and diastolic (congestive) heart failure: Secondary | ICD-10-CM | POA: Diagnosis not present

## 2018-04-04 DIAGNOSIS — N183 Chronic kidney disease, stage 3 (moderate): Secondary | ICD-10-CM | POA: Diagnosis not present

## 2018-04-04 DIAGNOSIS — R0602 Shortness of breath: Secondary | ICD-10-CM | POA: Diagnosis present

## 2018-04-04 DIAGNOSIS — I130 Hypertensive heart and chronic kidney disease with heart failure and stage 1 through stage 4 chronic kidney disease, or unspecified chronic kidney disease: Secondary | ICD-10-CM | POA: Diagnosis not present

## 2018-04-04 LAB — BASIC METABOLIC PANEL
ANION GAP: 9 (ref 5–15)
BUN: 36 mg/dL — ABNORMAL HIGH (ref 8–23)
CALCIUM: 7.9 mg/dL — AB (ref 8.9–10.3)
CO2: 24 mmol/L (ref 22–32)
Chloride: 106 mmol/L (ref 98–111)
Creatinine, Ser: 1.34 mg/dL — ABNORMAL HIGH (ref 0.44–1.00)
GFR calc non Af Amer: 38 mL/min — ABNORMAL LOW (ref >60)
GFR, EST AFRICAN AMERICAN: 44 mL/min — AB (ref >60)
GLUCOSE: 105 mg/dL — AB (ref 70–99)
Potassium: 4.1 mmol/L (ref 3.5–5.1)
Sodium: 139 mmol/L (ref 135–145)

## 2018-04-04 LAB — CBC
HCT: 39.1 % (ref 35.0–47.0)
HEMOGLOBIN: 12.8 g/dL (ref 12.0–16.0)
MCH: 30 pg (ref 26.0–34.0)
MCHC: 32.8 g/dL (ref 32.0–36.0)
MCV: 91.5 fL (ref 80.0–100.0)
Platelets: 180 10*3/uL (ref 150–440)
RBC: 4.27 MIL/uL (ref 3.80–5.20)
RDW: 16.2 % — ABNORMAL HIGH (ref 11.5–14.5)
WBC: 11.9 10*3/uL — ABNORMAL HIGH (ref 3.6–11.0)

## 2018-04-04 LAB — BRAIN NATRIURETIC PEPTIDE: B Natriuretic Peptide: 111 pg/mL — ABNORMAL HIGH (ref 0.0–100.0)

## 2018-04-04 LAB — TROPONIN I: Troponin I: <0.03 ng/mL (ref <0.03)

## 2018-04-04 NOTE — ED Provider Notes (Signed)
[facility] Emergency Department Provider Note  ____________________________________________   First MD Initiated Contact with Patient 04/04/18 2252     (approximate)  I have reviewed the triage vital signs and the nursing notes.   HISTORY  Chief Complaint Shortness of Breath   HPI [name] [name] is a 72 y.o. female the emergency department via EMS with 3 days of shortness of breath.  The patient has a complex past medical history including both COPD and CHF.  She reports she just recently completed a course of prednisone and her symptoms have become worse.  She has been coughing up brown sputum.  She does wear 4 L of oxygen at home chronically.  No fevers or chills.  Her symptoms are worse with exertion improved with rest.  She is able to lie completely flat.  She has not gained any weight.  No chest pain at this time.    Past Medical History:  Diagnosis Date  . Chronic combined systolic (congestive) and diastolic (congestive) heart failure (HCC)    a. 02/2017 Echo: EF 25-30%, Gr2 DD.  Marland Kitchen COPD (chronic obstructive pulmonary disease) (HCC)    a. On supplemental O2 @ home.  Marland Kitchen Hypertension   . Lung nodule   . Narrow complex tachycardia (HCC)    a. 02/2017 - ? NSVT vs Afib-->placed on amio.  No OAC.  Marland Kitchen NICM (nonischemic cardiomyopathy) (HCC)    a. 02/2017 Echo: Ef 25-30%, mild conc LVH, Gr2 DD, sev MR, mod dil LA;  b. 02/2016 Cath [facility]): nonobs dzs.  . Non-obstructive CAD    a. 02/2016 Cath [facility]): LM 30, LAD 20ost/p, 100m, 30d, D1 50p, D2 30, D3 20, RI 20, LCX 40p, OM1 30, OM2 20, RCA 20 diffuse, RPL2 30, RPL3 30.  Marland Kitchen Severe mitral regurgitation    a. 02/2017 Echo: Sev MR.    Patient Active Problem List   Diagnosis Date Noted  . Severe mitral regurgitation 11/10/2017  . OA (osteoarthritis) of knee 11/10/2017  . CKD (chronic kidney disease) stage 3, GFR 30-59 ml/min (HCC) 08/20/2017  . Insomnia 07/22/2017  . Hyperlipidemia 07/01/2017  . DDD (degenerative  disc disease), lumbar 04/30/2017  . B12 deficiency 04/30/2017  . Gait instability 03/31/2017  . Allergic rhinitis 03/31/2017  . Nausea 03/31/2017  . GERD (gastroesophageal reflux disease) 03/31/2017  . Chronic systolic heart failure (HCC) 03/24/2017  . HTN (hypertension) 03/24/2017  . COPD (chronic obstructive pulmonary disease) (HCC) 03/24/2017  . Chronic pain 03/24/2017  . MRSA carrier 03/18/2017  . History of ventricular tachycardia 03/18/2017  . Vitamin D deficiency 01/06/2017  . Chronic atrial fibrillation (HCC) 06/15/2016  . Depression 06/15/2016  . Varicose veins of lower extremity with inflammation 09/07/2011    Past Surgical History:  Procedure Laterality Date  . REPLACEMENT TOTAL KNEE    . TYMPANOSTOMY TUBE PLACEMENT Bilateral 03/19/2017    Prior to Admission medications   Medication Sig Start Date End Date Taking? Authorizing Provider  acetaminophen (TYLENOL) 500 MG tablet Take 2 tablets (1,000 mg total) by mouth 3 (three) times daily. 08/02/17   Plonk, Chrissie Noa, MD  albuterol (PROVENTIL HFA;VENTOLIN HFA) 108 (90 Base) MCG/ACT inhaler Inhale 2 puffs into the lungs every 6 (six) hours as needed for wheezing or shortness of breath. 12/07/17   Duanne Limerick, MD  albuterol (PROVENTIL) (2.5 MG/3ML) 0.083% nebulizer solution Take 3 mLs (2.5 mg total) by nebulization every 6 (six) hours as needed for wheezing or shortness of breath. 09/01/17   Schuyler Amor, MD  aspirin 81 MG chewable tablet Chew 81 mg by mouth daily.    [provider]  atorvastatin (LIPITOR) 40 MG tablet Take 1 tablet (40 mg total) by mouth daily. 12/07/17   Duanne Limerick, MD  budesonide-formoterol (SYMBICORT) 160-4.5 MCG/ACT inhaler Inhale 2 puffs into the lungs 2 (two) times daily. 12/07/17   Duanne Limerick, MD  buprenorphine Lavera Guise - DOSED MCG/HR) 5 MCG/HR PTWK patch Pain management/ [facility] 11/09/17   [provider]  carvedilol (COREG) 25 MG tablet Take 1 tablet (25 mg total) by mouth 2 (two)  times daily. 12/07/17   Duanne Limerick, MD  Cholecalciferol (VITAMIN D3) 1000 units CAPS Take 1 capsule by mouth daily.  07/24/17 07/24/18  [provider]  Cyanocobalamin (B-12) 1000 MCG TABS Take 1 tablet by mouth daily. 04/05/17   Plonk, Chrissie Noa, MD  doxycycline (VIBRA-TABS) 100 MG tablet Take 1 tablet (100 mg total) by mouth 2 (two) times daily for 7 days. 04/05/18 04/12/18  Merrily Brittle, MD  fluticasone (FLONASE) 50 MCG/ACT nasal spray Place 2 sprays into both nostrils daily. 12/07/17   Duanne Limerick, MD  furosemide (LASIX) 80 MG tablet Take 1 tablet (80 mg total) by mouth daily. 12/07/17   Duanne Limerick, MD  ketoconazole (NIZORAL) 2 % shampoo U TO [facility] SCALP 3 TIMES PER WEEK 08/30/17   [provider]  losartan (COZAAR) 25 MG tablet Take 1 tablet (25 mg total) by mouth daily. 12/07/17   Duanne Limerick, MD  omeprazole (PRILOSEC) 40 MG capsule Take 1 capsule (40 mg total) by mouth daily. 12/07/17 12/07/18  Duanne Limerick, MD  OXYGEN Inhale 4 L into the lungs.     [provider]  predniSONE (DELTASONE) 50 MG tablet Take 1 tablet (50 mg total) by mouth daily for 4 days. 04/05/18 04/09/18  Merrily Brittle, MD  promethazine (PHENERGAN) 25 MG tablet Take 0.5 tablets (12.5 mg total) by mouth every 6 (six) hours as needed for nausea. 08/16/17   Plonk, Chrissie Noa, MD  spironolactone (ALDACTONE) 25 MG tablet Take 1 tablet (25 mg total) by mouth daily. 12/07/17   Duanne Limerick, MD  traZODone (DESYREL) 50 MG tablet Take 1 tablet (50 mg total) by mouth at bedtime as needed for sleep. 12/13/17   Duanne Limerick, MD    Allergies Sulfa antibiotics; Cymbalta [duloxetine hcl]; and Tramadol hcl  Family History  Problem Relation Age of Onset  . Heart disease Mother   . Heart disease Sister   . Cancer Sister     Social History Social History   Tobacco Use  . Smoking status: Former Smoker    Packs/day: 3.00    Years: 55.00    Pack years: 165.00    Types: Cigarettes    Last  attempt to quit: 2018    Years since quitting: 1.6  . Smokeless tobacco: Never Used  . Tobacco comment: smoking cessation materials not required  Substance Use Topics  . Alcohol use: No  . Drug use: No    Review of Systems Constitutional: No fever/chills Eyes: No visual changes. ENT: No sore throat. Cardiovascular: Denies chest pain. Respiratory: Positive for shortness of breath. Gastrointestinal: No abdominal pain.  No nausea, no vomiting.  No diarrhea.  No constipation. Genitourinary: Negative for dysuria. Musculoskeletal: Negative for back pain. Skin: Negative for rash. Neurological: Negative for headaches, focal weakness or numbness.   ____________________________________________   PHYSICAL EXAM:  VITAL SIGNS: ED Triage Vitals  Enc Vitals Group  BP 04/04/18 2143 132/68     Pulse Rate 04/04/18 2143 96     Resp 04/04/18 2143 (!) 21     Temp 04/04/18 2143 97.7 F (36.5 C)     Temp Source 04/04/18 2143 Oral     SpO2 04/04/18 2143 100 %     Weight 04/04/18 2144 205 lb (93 kg)     Height 04/04/18 2144 5\' 2"  (1.575 m)     Head Circumference --      Peak Flow --      Pain Score 04/04/18 2144 10     Pain Loc --      Pain Edu? --      Excl. in GC? --     Constitutional: Alert and oriented x4 appears quite short of breath using mild accessory muscles and speaking in short sentences Eyes: PERRL EOMI. Head: Atraumatic. Nose: No congestion/rhinnorhea. Mouth/Throat: No trismus Neck: No stridor.  Able to lie completely flat with no JVD Cardiovascular: Normal rate, regular rhythm. Grossly normal heart sounds.  Good peripheral circulation. Respiratory: Increased respiratory effort with expiratory wheeze throughout lung sounds are equal bilaterally Gastrointestinal: Soft nontender Musculoskeletal: No lower extremity edema   Neurologic:  Normal speech and language. No gross focal neurologic deficits are appreciated. Skin:  Skin is warm, dry and intact. No rash  noted. Psychiatric: Mood and affect are normal. Speech and behavior are normal.    ____________________________________________   DIFFERENTIAL includes but not limited to  COPD exacerbation, pulmonary embolism, pulmonary edema ____________________________________________   LABS (all labs ordered are listed, but only abnormal results are displayed)  Labs Reviewed  BASIC METABOLIC PANEL - Abnormal; Notable for the following components:      Result Value   Glucose, Bld 105 (*)    BUN 36 (*)    Creatinine, Ser 1.34 (*)    Calcium 7.9 (*)    GFR calc non Af Amer 38 (*)    GFR calc Af Amer 44 (*)    All other components within normal limits  CBC - Abnormal; Notable for the following components:   WBC 11.9 (*)    RDW 16.2 (*)    All other components within normal limits  BRAIN NATRIURETIC PEPTIDE - Abnormal; Notable for the following components:   B Natriuretic Peptide 111.0 (*)    All other components within normal limits  TROPONIN I    Lab work reviewed by me shows slightly elevated white count which is nonspecific could be secondary to recent steroids.  Beta and atretic peptide is almost normal __________________________________________  EKG  Normal sinus rhythm at 76 leftward axis no acute ST changes poor R wave progression abnormal EKG ____________________________________________  RADIOLOGY  X-ray reviewed by me with no acute disease ____________________________________________   PROCEDURES  Procedure(s) performed: no  Procedures  Critical Care performed: no  ____________________________________________   INITIAL IMPRESSION / ASSESSMENT AND PLAN / ED COURSE  Pertinent labs & imaging results that were available during my care of the patient were reviewed by me and considered in my medical decision making (see chart for details).   As part of my medical decision making, I reviewed the following data within the electronic MEDICAL RECORD NUMBER History obtained  from family if available, nursing notes, old chart and ekg, as well as notes from prior ED visits.  Patient comes to the emergency department hypoxic on room air although using her normal 4 L and saturating 99 to 100%.  She does sound quite wheezy.  I appreciate  that she has both CHF and COPD however she appears clinically euvolemic now and I think this is more likely pulmonary.  After 3 duo nebs and steroids her symptoms are significantly improved and she would like to go home.  I will discharge her home with a short burst of steroids as well as doxycycline.  Strict return precautions have been given.      ____________________________________________   FINAL CLINICAL IMPRESSION(S) / ED DIAGNOSES  Final diagnoses:  COPD exacerbation (HCC)      NEW MEDICATIONS STARTED DURING THIS VISIT:  Discharge Medication List as of 04/05/2018  1:37 AM    START taking these medications   Details  doxycycline (VIBRA-TABS) 100 MG tablet Take 1 tablet (100 mg total) by mouth 2 (two) times daily for 7 days., Starting Tue 04/05/2018, Until Tue 04/12/2018, Print         Note:  This document was prepared using Dragon voice recognition software and may include unintentional dictation errors.     Merrily Brittle, MD 04/08/18 [phone]

## 2018-04-04 NOTE — ED Triage Notes (Signed)
Pt via ems from home with sob x 3 days. Pt has hx of of copd and chf; states she has been coughing up brown sputum doday. Pt wears 4L O2 chronically. She reports that she just finished a round of prednisone yesterday. Pt alert & oriented with NAD noted.

## 2018-04-05 MED ORDER — SODIUM CHLORIDE 0.9 % IV BOLUS
500.0000 mL | Freq: Once | INTRAVENOUS | Status: AC
  Administered 2018-04-05: 500 mL via INTRAVENOUS

## 2018-04-05 MED ORDER — METHYLPREDNISOLONE SODIUM SUCC 125 MG IJ SOLR
125.0000 mg | Freq: Once | INTRAMUSCULAR | Status: AC
  Administered 2018-04-05: 125 mg via INTRAVENOUS
  Filled 2018-04-05: qty 2

## 2018-04-05 MED ORDER — IPRATROPIUM-ALBUTEROL 0.5-2.5 (3) MG/3ML IN SOLN
3.0000 mL | Freq: Once | RESPIRATORY_TRACT | Status: AC
  Administered 2018-04-05: 3 mL via RESPIRATORY_TRACT
  Filled 2018-04-05: qty 3

## 2018-04-05 MED ORDER — DOXYCYCLINE HYCLATE 100 MG PO TABS
100.0000 mg | ORAL_TABLET | Freq: Once | ORAL | Status: AC
  Administered 2018-04-05: 100 mg via ORAL
  Filled 2018-04-05: qty 1

## 2018-04-05 MED ORDER — PREDNISONE 50 MG PO TABS: 50.0000 mg | ORAL_TABLET | Freq: Every day | ORAL | 0 refills | Status: AC

## 2018-04-05 MED ORDER — DOXYCYCLINE HYCLATE 100 MG PO TABS: 100.0000 mg | ORAL_TABLET | Freq: Two times a day (BID) | ORAL | 0 refills | Status: AC

## 2018-04-05 NOTE — Discharge Instructions (Signed)
It was a pleasure to take care of you today, and thank you for coming to our emergency department.  If you have any questions or concerns before leaving please ask the nurse to grab me and I'm more than happy to go through your aftercare instructions again.  If you were prescribed any opioid pain medication today such as Norco, Vicodin, Percocet, morphine, hydrocodone, or oxycodone please make sure you do not drive when you are taking this medication as it can alter your ability to drive safely.  If you have any concerns once you are home that you are not improving or are in fact getting worse before you can make it to your follow-up appointment, please do not hesitate to call 911 and come back for further evaluation.  Merrily Brittle, MD  Results for orders placed or performed during the hospital encounter of 04/04/18  Basic metabolic panel  Result Value Ref Range   Sodium 139 135 - 145 mmol/L   Potassium 4.1 3.5 - 5.1 mmol/L   Chloride 106 98 - 111 mmol/L   CO2 24 22 - 32 mmol/L   Glucose, Bld 105 (H) 70 - 99 mg/dL   BUN 36 (H) 8 - 23 mg/dL   Creatinine, Ser 8.41 (H) 0.44 - 1.00 mg/dL   Calcium 7.9 (L) 8.9 - 10.3 mg/dL   GFR calc non Af Amer 38 (L) >60 mL/min   GFR calc Af Amer 44 (L) >60 mL/min   Anion gap 9 5 - 15  CBC  Result Value Ref Range   WBC 11.9 (H) 3.6 - 11.0 K/uL   RBC 4.27 3.80 - 5.20 MIL/uL   Hemoglobin 12.8 12.0 - 16.0 g/dL   HCT 32.4 40.1 - 02.7 %   MCV 91.5 80.0 - 100.0 fL   MCH 30.0 26.0 - 34.0 pg   MCHC 32.8 32.0 - 36.0 g/dL   RDW 25.3 (H) 66.4 - 40.3 %   Platelets 180 150 - 440 K/uL  Troponin I  Result Value Ref Range   Troponin I <0.03 <0.03 ng/mL  Brain natriuretic peptide  Result Value Ref Range   B Natriuretic Peptide 111.0 (H) 0.0 - 100.0 pg/mL   Dg Chest 2 View  Result Date: 04/04/2018 CLINICAL DATA:  Shortness of breath EXAM: CHEST - 2 VIEW COMPARISON:  12/29/2017 FINDINGS: Surgical plate and screw fixation of the right clavicle. No focal airspace  disease. No pleural effusion. Mild cardiomegaly with aortic atherosclerosis. No pneumothorax. IMPRESSION: No active cardiopulmonary disease.  Mild cardiomegaly. Electronically Signed   By: Jasmine Pang M.D.   On: 04/04/2018 22:32

## 2018-04-05 NOTE — ED Notes (Signed)
Pt alert.  meds given.  Iv fluids infusing.  Family with pt.

## 2018-04-13 ENCOUNTER — Ambulatory Visit
Admission: RE | Admit: 2018-04-13 | Discharge: 2018-04-13 | Disposition: A | Discharge status: Home / Self Care | Payer: Medicare Other | Source: Ambulatory Visit | Attending: Internal Medicine | Admitting: Internal Medicine

## 2018-04-13 ENCOUNTER — Other Ambulatory Visit: Payer: Self-pay | Admitting: Internal Medicine

## 2018-04-13 DIAGNOSIS — M7989 Other specified soft tissue disorders: Secondary | ICD-10-CM

## 2018-04-19 ENCOUNTER — Other Ambulatory Visit: Payer: Self-pay

## 2018-04-19 ENCOUNTER — Ambulatory Visit
Admission: EM | Admit: 2018-04-19 | Discharge: 2018-04-19 | Disposition: A | Discharge status: Home / Self Care | Payer: Medicare Other | Attending: Family Medicine | Admitting: Family Medicine

## 2018-04-19 DIAGNOSIS — N183 Chronic kidney disease, stage 3 (moderate): Secondary | ICD-10-CM | POA: Diagnosis not present

## 2018-04-19 DIAGNOSIS — Z7982 Long term (current) use of aspirin: Secondary | ICD-10-CM | POA: Insufficient documentation

## 2018-04-19 DIAGNOSIS — M5136 Other intervertebral disc degeneration, lumbar region: Secondary | ICD-10-CM | POA: Diagnosis not present

## 2018-04-19 DIAGNOSIS — I130 Hypertensive heart and chronic kidney disease with heart failure and stage 1 through stage 4 chronic kidney disease, or unspecified chronic kidney disease: Secondary | ICD-10-CM | POA: Insufficient documentation

## 2018-04-19 DIAGNOSIS — I2510 Atherosclerotic heart disease of native coronary artery without angina pectoris: Secondary | ICD-10-CM | POA: Diagnosis not present

## 2018-04-19 DIAGNOSIS — F329 Major depressive disorder, single episode, unspecified: Secondary | ICD-10-CM | POA: Diagnosis not present

## 2018-04-19 DIAGNOSIS — E559 Vitamin D deficiency, unspecified: Secondary | ICD-10-CM | POA: Diagnosis not present

## 2018-04-19 DIAGNOSIS — R0602 Shortness of breath: Secondary | ICD-10-CM | POA: Insufficient documentation

## 2018-04-19 DIAGNOSIS — E785 Hyperlipidemia, unspecified: Secondary | ICD-10-CM | POA: Insufficient documentation

## 2018-04-19 DIAGNOSIS — Z79899 Other long term (current) drug therapy: Secondary | ICD-10-CM | POA: Diagnosis not present

## 2018-04-19 DIAGNOSIS — I5042 Chronic combined systolic (congestive) and diastolic (congestive) heart failure: Secondary | ICD-10-CM | POA: Diagnosis not present

## 2018-04-19 DIAGNOSIS — I482 Chronic atrial fibrillation: Secondary | ICD-10-CM | POA: Diagnosis not present

## 2018-04-19 DIAGNOSIS — E538 Deficiency of other specified B group vitamins: Secondary | ICD-10-CM | POA: Insufficient documentation

## 2018-04-19 DIAGNOSIS — R600 Localized edema: Secondary | ICD-10-CM | POA: Diagnosis not present

## 2018-04-19 DIAGNOSIS — Z87891 Personal history of nicotine dependence: Secondary | ICD-10-CM | POA: Diagnosis not present

## 2018-04-19 DIAGNOSIS — K219 Gastro-esophageal reflux disease without esophagitis: Secondary | ICD-10-CM | POA: Diagnosis not present

## 2018-04-19 DIAGNOSIS — Z882 Allergy status to sulfonamides status: Secondary | ICD-10-CM | POA: Diagnosis not present

## 2018-04-19 DIAGNOSIS — Z888 Allergy status to other drugs, medicaments and biological substances status: Secondary | ICD-10-CM | POA: Insufficient documentation

## 2018-04-19 DIAGNOSIS — J449 Chronic obstructive pulmonary disease, unspecified: Secondary | ICD-10-CM | POA: Insufficient documentation

## 2018-04-19 DIAGNOSIS — R05 Cough: Secondary | ICD-10-CM

## 2018-04-19 MED ORDER — ALBUTEROL SULFATE (2.5 MG/3ML) 0.083% IN NEBU
2.5000 mg | INHALATION_SOLUTION | Freq: Once | RESPIRATORY_TRACT | Status: AC
  Administered 2018-04-19: 2.5 mg via RESPIRATORY_TRACT

## 2018-04-19 NOTE — Discharge Instructions (Addendum)
Go directly to emergency room as discussed.  °

## 2018-04-19 NOTE — ED Triage Notes (Addendum)
Patient complains of cough, shortness of breath, chest pain that started today (states it burns all in chest), states that she almost passed out at her home today. Patient complains of bilateral leg swelling.

## 2018-04-19 NOTE — ED Provider Notes (Signed)
[facility] ____________________________________________  Time seen: Approximately 6:34 PM  I have reviewed the triage vital signs and the nursing notes.   HISTORY  Chief Complaint Shortness of Breath   HPI  [name] [name] is a 72 y.o. female presenting with daughter at bedside for evaluation shortness of breath, chest pain described as burning, with accompanying cough and nasal congestion.  Reports also sneezing and feeling flushed.  States symptoms started last night but developed chest discomfort today with increasing shortness of breath.  4 L nasal cannula O2 at baseline, and reports this is continued but continues with shortness of breath.  Reports near syncopal episode this afternoon while ambulating to the bathroom with oxygen on, stating this is atypical for her.  Denies baseline lower extremity edema, but reports the last 2 days her legs have been very swollen.  Recently had right lower extreme the ultrasound, and per patient negative DVT study.  Used albuterol inhaler prior to coming to urgent care without improvement of shortness of breath.  Denies other aggravating alleviating factors except states that she called her primary doctor's office today and they sent in Ceftin which did not change anything.  Denies other aggravating alleviating factors. Denies recent sickness. Denies recent antibiotic use.   Barbette Reichmann, MD: PCP   Past Medical History:  Diagnosis Date  . Chronic combined systolic (congestive) and diastolic (congestive) heart failure (HCC)    a. 02/2017 Echo: EF 25-30%, Gr2 DD.  Marland Kitchen COPD (chronic obstructive pulmonary disease) (HCC)    a. On supplemental O2 @ home.  Marland Kitchen Hypertension   . Lung nodule   . Narrow complex tachycardia (HCC)    a. 02/2017 - ? NSVT vs Afib-->placed on amio.  No OAC.  Marland Kitchen NICM (nonischemic cardiomyopathy) (HCC)    a. 02/2017 Echo: Ef 25-30%, mild conc LVH, Gr2 DD, sev MR, mod dil LA;  b. 02/2016 Cath [facility]): nonobs dzs.  .  Non-obstructive CAD    a. 02/2016 Cath [facility]): LM 30, LAD 20ost/p, 26m, 30d, D1 50p, D2 30, D3 20, RI 20, LCX 40p, OM1 30, OM2 20, RCA 20 diffuse, RPL2 30, RPL3 30.  Marland Kitchen Severe mitral regurgitation    a. 02/2017 Echo: Sev MR.    Patient Active Problem List   Diagnosis Date Noted  . Severe mitral regurgitation 11/10/2017  . OA (osteoarthritis) of knee 11/10/2017  . CKD (chronic kidney disease) stage 3, GFR 30-59 ml/min (HCC) 08/20/2017  . Insomnia 07/22/2017  . Hyperlipidemia 07/01/2017  . DDD (degenerative disc disease), lumbar 04/30/2017  . B12 deficiency 04/30/2017  . Gait instability 03/31/2017  . Allergic rhinitis 03/31/2017  . Nausea 03/31/2017  . GERD (gastroesophageal reflux disease) 03/31/2017  . Chronic systolic heart failure (HCC) 03/24/2017  . HTN (hypertension) 03/24/2017  . COPD (chronic obstructive pulmonary disease) (HCC) 03/24/2017  . Chronic pain 03/24/2017  . MRSA carrier 03/18/2017  . History of ventricular tachycardia 03/18/2017  . Vitamin D deficiency 01/06/2017  . Chronic atrial fibrillation (HCC) 06/15/2016  . Depression 06/15/2016  . Varicose veins of lower extremity with inflammation 09/07/2011    Past Surgical History:  Procedure Laterality Date  . REPLACEMENT TOTAL KNEE    . TYMPANOSTOMY TUBE PLACEMENT Bilateral 03/19/2017      Current Facility-Administered Medications:  .  ipratropium-albuterol (DUONEB) 0.5-2.5 (3) MG/3ML nebulizer solution 3 mL, 3 mL, Nebulization, Once, Jones, Deanna C, MD  Current Outpatient Medications:  .  acetaminophen (TYLENOL) 500 MG tablet, Take 2 tablets (1,000 mg total) by mouth 3 (three)  times daily., Disp: 30 tablet, Rfl: 0 .  albuterol (PROVENTIL HFA;VENTOLIN HFA) 108 (90 Base) MCG/ACT inhaler, Inhale 2 puffs into the lungs every 6 (six) hours as needed for wheezing or shortness of breath., Disp: 1 Inhaler, Rfl: 6 .  albuterol (PROVENTIL) (2.5 MG/3ML) 0.083% nebulizer solution, Take 3 mLs (2.5 mg total) by nebulization  every 6 (six) hours as needed for wheezing or shortness of breath., Disp: 150 mL, Rfl: 1 .  aspirin 81 MG chewable tablet, Chew 81 mg by mouth daily., Disp: , Rfl:  .  atorvastatin (LIPITOR) 40 MG tablet, Take 1 tablet (40 mg total) by mouth daily., Disp: 90 tablet, Rfl: 1 .  budesonide-formoterol (SYMBICORT) 160-4.5 MCG/ACT inhaler, Inhale 2 puffs into the lungs 2 (two) times daily., Disp: 1 Inhaler, Rfl: 6 .  carvedilol (COREG) 25 MG tablet, Take 1 tablet (25 mg total) by mouth 2 (two) times daily., Disp: 180 tablet, Rfl: 3 .  Cholecalciferol (VITAMIN D3) 1000 units CAPS, Take 1 capsule by mouth daily. , Disp: , Rfl:  .  Cyanocobalamin (B-12) 1000 MCG TABS, Take 1 tablet by mouth daily., Disp: 30 tablet, Rfl:  .  fluticasone (FLONASE) 50 MCG/ACT nasal spray, Place 2 sprays into both nostrils daily., Disp: 16 g, Rfl: 11 .  furosemide (LASIX) 80 MG tablet, Take 1 tablet (80 mg total) by mouth daily., Disp: 90 tablet, Rfl: 1 .  ketoconazole (NIZORAL) 2 % shampoo, U TO WASH SCALP 3 TIMES PER WEEK, Disp: , Rfl: 5 .  losartan (COZAAR) 25 MG tablet, Take 1 tablet (25 mg total) by mouth daily., Disp: 90 tablet, Rfl: 1 .  mirtazapine (REMERON) 15 MG tablet, , Disp: , Rfl: 3 .  omeprazole (PRILOSEC) 40 MG capsule, Take 1 capsule (40 mg total) by mouth daily., Disp: 90 capsule, Rfl: 1 .  OXYGEN, Inhale 4 L into the lungs. , Disp: , Rfl:  .  predniSONE (DELTASONE) 10 MG tablet, , Disp: , Rfl: 0 .  promethazine (PHENERGAN) 25 MG tablet, Take 0.5 tablets (12.5 mg total) by mouth every 6 (six) hours as needed for nausea., Disp: 30 tablet, Rfl: 2 .  spironolactone (ALDACTONE) 25 MG tablet, Take 1 tablet (25 mg total) by mouth daily., Disp: 90 tablet, Rfl: 1 .  traMADol (ULTRAM) 50 MG tablet, , Disp: , Rfl: 2 .  traZODone (DESYREL) 50 MG tablet, Take 1 tablet (50 mg total) by mouth at bedtime as needed for sleep., Disp: 90 tablet, Rfl: 0  Allergies Sulfa antibiotics; Cymbalta [duloxetine hcl]; and Tramadol  hcl  Family History  Problem Relation Age of Onset  . Heart disease Mother   . Heart disease Sister   . Cancer Sister     Social History Social History   Tobacco Use  . Smoking status: Former Smoker    Packs/day: 3.00    Years: 55.00    Pack years: 165.00    Types: Cigarettes    Last attempt to quit: 2018    Years since quitting: 1.7  . Smokeless tobacco: Never Used  . Tobacco comment: smoking cessation materials not required  Substance Use Topics  . Alcohol use: No  . Drug use: No    Review of Systems Constitutional: No fever ENT: No sore throat. Cardiovascular: positive chest pain. Respiratory: positive shortness of breath. Gastrointestinal: States some suprapubic tenderness today.  Denies other abdominal pain. Genitourinary: Negative for dysuria. Musculoskeletal: Negative for back pain. Skin: Negative for rash.   ____________________________________________   PHYSICAL EXAM:  VITAL SIGNS: ED  Triage Vitals  Enc Vitals Group     BP 04/19/18 1806 138/79     Pulse Rate 04/19/18 1806 100     Resp 04/19/18 1806 18     Temp 04/19/18 1829 98.3 F (36.8 C)     Temp Source 04/19/18 1829 Oral     SpO2 04/19/18 1806 100 %     Weight 04/19/18 1804 205 lb (93 kg)     Height 04/19/18 1804 5\' 2"  (1.575 m)     Head Circumference --      Peak Flow --      Pain Score 04/19/18 1805 10     Pain Loc --      Pain Edu? --      Excl. in GC? --     Constitutional: Alert and oriented. Well appearing and in no acute distress. Eyes: Conjunctivae are normal.  ENT      Head: Normocephalic and atraumatic.      Nose: No congestion/rhinnorhea.      Mouth/Throat: Mucous membranes are moist.Oropharynx non-erythematous. Neck: No stridor. Supple without meningismus.  Hematological/Lymphatic/Immunilogical: No cervical lymphadenopathy. Cardiovascular: Normal rate, regular rhythm. Grossly normal heart sounds.  Good peripheral circulation. Respiratory: Normal respiratory effort  without tachypnea nor retractions.  Mild scattered wheezing.  No rhonchi. Gastrointestinal: Minimal suprapubic tenderness.  Abdomen otherwise soft and nontender. Musculoskeletal: Bilateral lower extremities 2+ pitting edema.  Bilateral pedal pulses equal and easily palpated. Neurologic:  Normal speech and language. Speech is normal. No gait instability.  Skin:  Skin is warm, dry and intact. No rash noted. Psychiatric: Mood and affect are normal. Speech and behavior are normal. Patient exhibits appropriate insight and judgment   ___________________________________________   LABS (all labs ordered are listed, but only abnormal results are displayed)  Labs Reviewed - No data to display ____________________________________________  EKG  ED ECG REPORT I, Renford Dills, the attending provider and Dr Judd Gaudier, personally viewed and interpreted this ECG.   Date: 04/19/2018  EKG Time: 1810  Rate: 91  Rhythm: sinus rhythm with PACs  Axis: normal   Intervals:none  ST&T Change: no ST elevation  RADIOLOGY  No results found. ____________________________________________   PROCEDURES Procedures   INITIAL IMPRESSION / ASSESSMENT AND PLAN / ED COURSE  Pertinent labs & imaging results that were available during my care of the patient were reviewed by me and considered in my medical decision making (see chart for details).  Overall well-appearing patient.  Daughter at bedside.  Suspect current viral illness with acute COPD versus CHF exacerbation.  With multiple comorbidities, and current complaints and complaints of chest pain recommend further evaluation in emergency room at this time.  Albuterol treatment given once in urgent care.  Patient and patient daughter reports that she will take her directly [facility].  Herbert Seta RN triage nurse [facility] called and given report.  Patient stable at time of discharge.   ____________________________________________   FINAL CLINICAL  IMPRESSION(S) / ED DIAGNOSES  Final diagnoses:  SOB (shortness of breath)  Lower extremity edema     ED Discharge Orders    None       Note: This dictation was prepared with Dragon dictation along with smaller phrase technology. Any transcriptional errors that result from this process are unintentional.         Renford Dills, NP 04/19/18 1843

## 2018-05-28 ENCOUNTER — Observation Stay
Admission: EM | Admit: 2018-05-28 | Discharge: 2018-05-29 | Disposition: A | Discharge status: Home / Self Care | Payer: Medicare Other | Attending: Internal Medicine | Admitting: Internal Medicine

## 2018-05-28 ENCOUNTER — Other Ambulatory Visit: Payer: Self-pay

## 2018-05-28 ENCOUNTER — Emergency Department: Payer: Medicare Other

## 2018-05-28 ENCOUNTER — Encounter: Payer: Self-pay | Admitting: Emergency Medicine

## 2018-05-28 DIAGNOSIS — Z9981 Dependence on supplemental oxygen: Secondary | ICD-10-CM | POA: Diagnosis not present

## 2018-05-28 DIAGNOSIS — N183 Chronic kidney disease, stage 3 (moderate): Secondary | ICD-10-CM | POA: Diagnosis not present

## 2018-05-28 DIAGNOSIS — J441 Chronic obstructive pulmonary disease with (acute) exacerbation: Secondary | ICD-10-CM | POA: Insufficient documentation

## 2018-05-28 DIAGNOSIS — I2510 Atherosclerotic heart disease of native coronary artery without angina pectoris: Secondary | ICD-10-CM | POA: Insufficient documentation

## 2018-05-28 DIAGNOSIS — Z882 Allergy status to sulfonamides status: Secondary | ICD-10-CM | POA: Insufficient documentation

## 2018-05-28 DIAGNOSIS — K219 Gastro-esophageal reflux disease without esophagitis: Secondary | ICD-10-CM | POA: Diagnosis not present

## 2018-05-28 DIAGNOSIS — Z79899 Other long term (current) drug therapy: Secondary | ICD-10-CM | POA: Diagnosis not present

## 2018-05-28 DIAGNOSIS — E785 Hyperlipidemia, unspecified: Secondary | ICD-10-CM | POA: Diagnosis not present

## 2018-05-28 DIAGNOSIS — Z7982 Long term (current) use of aspirin: Secondary | ICD-10-CM | POA: Insufficient documentation

## 2018-05-28 DIAGNOSIS — I130 Hypertensive heart and chronic kidney disease with heart failure and stage 1 through stage 4 chronic kidney disease, or unspecified chronic kidney disease: Secondary | ICD-10-CM | POA: Diagnosis not present

## 2018-05-28 DIAGNOSIS — Z87891 Personal history of nicotine dependence: Secondary | ICD-10-CM | POA: Insufficient documentation

## 2018-05-28 DIAGNOSIS — E538 Deficiency of other specified B group vitamins: Secondary | ICD-10-CM | POA: Insufficient documentation

## 2018-05-28 DIAGNOSIS — G4700 Insomnia, unspecified: Secondary | ICD-10-CM | POA: Insufficient documentation

## 2018-05-28 DIAGNOSIS — Z22322 Carrier or suspected carrier of Methicillin resistant Staphylococcus aureus: Secondary | ICD-10-CM | POA: Diagnosis not present

## 2018-05-28 DIAGNOSIS — Z888 Allergy status to other drugs, medicaments and biological substances status: Secondary | ICD-10-CM | POA: Insufficient documentation

## 2018-05-28 DIAGNOSIS — I428 Other cardiomyopathies: Secondary | ICD-10-CM | POA: Diagnosis not present

## 2018-05-28 DIAGNOSIS — Z7952 Long term (current) use of systemic steroids: Secondary | ICD-10-CM | POA: Insufficient documentation

## 2018-05-28 DIAGNOSIS — J440 Chronic obstructive pulmonary disease with acute lower respiratory infection: Secondary | ICD-10-CM

## 2018-05-28 DIAGNOSIS — F329 Major depressive disorder, single episode, unspecified: Secondary | ICD-10-CM | POA: Diagnosis not present

## 2018-05-28 DIAGNOSIS — I10 Essential (primary) hypertension: Secondary | ICD-10-CM | POA: Diagnosis present

## 2018-05-28 DIAGNOSIS — Z8249 Family history of ischemic heart disease and other diseases of the circulatory system: Secondary | ICD-10-CM | POA: Insufficient documentation

## 2018-05-28 DIAGNOSIS — I340 Nonrheumatic mitral (valve) insufficiency: Secondary | ICD-10-CM | POA: Insufficient documentation

## 2018-05-28 DIAGNOSIS — M5136 Other intervertebral disc degeneration, lumbar region: Secondary | ICD-10-CM | POA: Insufficient documentation

## 2018-05-28 DIAGNOSIS — J9621 Acute and chronic respiratory failure with hypoxia: Secondary | ICD-10-CM | POA: Diagnosis not present

## 2018-05-28 DIAGNOSIS — J209 Acute bronchitis, unspecified: Secondary | ICD-10-CM

## 2018-05-28 DIAGNOSIS — I5022 Chronic systolic (congestive) heart failure: Secondary | ICD-10-CM | POA: Diagnosis present

## 2018-05-28 DIAGNOSIS — I5042 Chronic combined systolic (congestive) and diastolic (congestive) heart failure: Secondary | ICD-10-CM | POA: Diagnosis not present

## 2018-05-28 DIAGNOSIS — E559 Vitamin D deficiency, unspecified: Secondary | ICD-10-CM | POA: Diagnosis not present

## 2018-05-28 DIAGNOSIS — Z7951 Long term (current) use of inhaled steroids: Secondary | ICD-10-CM | POA: Diagnosis not present

## 2018-05-28 HISTORY — DX: Unspecified asthma, uncomplicated: J45.909

## 2018-05-28 LAB — BASIC METABOLIC PANEL
ANION GAP: 9 (ref 5–15)
BUN: 16 mg/dL (ref 8–23)
CALCIUM: 8.8 mg/dL — AB (ref 8.9–10.3)
CO2: 31 mmol/L (ref 22–32)
Chloride: 103 mmol/L (ref 98–111)
Creatinine, Ser: 1.27 mg/dL — ABNORMAL HIGH (ref 0.44–1.00)
GFR calc non Af Amer: 41 mL/min — ABNORMAL LOW (ref >60)
GFR, EST AFRICAN AMERICAN: 47 mL/min — AB (ref >60)
Glucose, Bld: 108 mg/dL — ABNORMAL HIGH (ref 70–99)
Potassium: 4.1 mmol/L (ref 3.5–5.1)
Sodium: 143 mmol/L (ref 135–145)

## 2018-05-28 LAB — CBC WITH DIFFERENTIAL/PLATELET
Abs Immature Granulocytes: 0.03 10*3/uL (ref 0.00–0.07)
Basophils Absolute: 0 10*3/uL (ref 0.0–0.1)
Basophils Relative: 1 %
EOS ABS: 0.2 10*3/uL (ref 0.0–0.5)
EOS PCT: 2 %
HCT: 35.5 % — ABNORMAL LOW (ref 36.0–46.0)
HEMOGLOBIN: 10.9 g/dL — AB (ref 12.0–15.0)
IMMATURE GRANULOCYTES: 1 %
LYMPHS PCT: 19 %
Lymphs Abs: 1.2 10*3/uL (ref 0.7–4.0)
MCH: 29.5 pg (ref 26.0–34.0)
MCHC: 30.7 g/dL (ref 30.0–36.0)
MCV: 95.9 fL (ref 80.0–100.0)
Monocytes Absolute: 0.4 10*3/uL (ref 0.1–1.0)
Monocytes Relative: 6 %
NEUTROS PCT: 71 %
NRBC: 0 % (ref 0.0–0.2)
Neutro Abs: 4.5 10*3/uL (ref 1.7–7.7)
Platelets: 199 10*3/uL (ref 150–400)
RBC: 3.7 MIL/uL — AB (ref 3.87–5.11)
RDW: 14.5 % (ref 11.5–15.5)
WBC: 6.3 10*3/uL (ref 4.0–10.5)

## 2018-05-28 LAB — TROPONIN I: Troponin I: 0.04 ng/mL (ref <0.03)

## 2018-05-28 LAB — BRAIN NATRIURETIC PEPTIDE: B NATRIURETIC PEPTIDE 5: 120 pg/mL — AB (ref 0.0–100.0)

## 2018-05-28 LAB — FIBRIN DERIVATIVES D-DIMER (ARMC ONLY): FIBRIN DERIVATIVES D-DIMER (ARMC): 737.5 ng{FEU}/mL — AB (ref 0.00–499.00)

## 2018-05-28 MED ORDER — IPRATROPIUM-ALBUTEROL 0.5-2.5 (3) MG/3ML IN SOLN
3.0000 mL | Freq: Once | RESPIRATORY_TRACT | Status: AC
  Administered 2018-05-28: 3 mL via RESPIRATORY_TRACT
  Filled 2018-05-28: qty 3

## 2018-05-28 MED ORDER — IOPAMIDOL (ISOVUE-370) INJECTION 76%
60.0000 mL | Freq: Once | INTRAVENOUS | Status: AC | PRN
  Administered 2018-05-28: 60 mL via INTRAVENOUS

## 2018-05-28 MED ORDER — METHYLPREDNISOLONE SODIUM SUCC 125 MG IJ SOLR
125.0000 mg | Freq: Once | INTRAMUSCULAR | Status: AC
  Administered 2018-05-28: 125 mg via INTRAVENOUS
  Filled 2018-05-28: qty 2

## 2018-05-28 NOTE — ED Notes (Signed)
Patient given PO fluids per patient request with MD Don Perking approval.  Called lab to request phlebotomist.

## 2018-05-28 NOTE — ED Provider Notes (Signed)
[facility] Emergency Department Provider Note  ____________________________________________  Time seen: Approximately 7:15 PM  I have reviewed the triage vital signs and the nursing notes.   HISTORY  Chief Complaint Shortness of Breath   HPI [name] [name] is a 72 y.o. female with a history of CHF with EF of 20 to 30%, COPD on 4 L nasal cannula, hypertension, CAD who presents for evaluation of shortness of breath.  Patient reports that she has had cough productive of brown sputum and chest congestion for couple of days.  This morning she woke up with shortness of breath.  She has been using her inhalers around-the-clock with no significant relief.  Also complaining of intermittent sharp L sided chest pain that is presents when she coughs but no pain at this time. No fever, chills, hemoptysis, leg pain or swelling, prior history of PE or DVT.  Patient reports that her shortness of breath is moderate at rest and severe with minimal exertion.  No abdominal pain, nausea, vomiting, body aches.  Per EMS patient was hypoxic on 4 L nasal cannula satting 83% which improved when they increase her to 6 L.   Past Medical History:  Diagnosis Date  . Asthma   . Chronic combined systolic (congestive) and diastolic (congestive) heart failure (HCC)    a. 02/2017 Echo: EF 25-30%, Gr2 DD.  Marland Kitchen COPD (chronic obstructive pulmonary disease) (HCC)    a. On supplemental O2 @ home.  Marland Kitchen Hypertension   . Lung nodule   . Narrow complex tachycardia (HCC)    a. 02/2017 - ? NSVT vs Afib-->placed on amio.  No OAC.  Marland Kitchen NICM (nonischemic cardiomyopathy) (HCC)    a. 02/2017 Echo: Ef 25-30%, mild conc LVH, Gr2 DD, sev MR, mod dil LA;  b. 02/2016 Cath [facility]): nonobs dzs.  . Non-obstructive CAD    a. 02/2016 Cath [facility]): LM 30, LAD 20ost/p, 43m, 30d, D1 50p, D2 30, D3 20, RI 20, LCX 40p, OM1 30, OM2 20, RCA 20 diffuse, RPL2 30, RPL3 30.  Marland Kitchen Severe mitral regurgitation    a. 02/2017 Echo: Sev MR.     Patient Active Problem List   Diagnosis Date Noted  . COPD with acute exacerbation (HCC) 05/29/2018  . Severe mitral regurgitation 11/10/2017  . OA (osteoarthritis) of knee 11/10/2017  . CKD (chronic kidney disease) stage 3, GFR 30-59 ml/min (HCC) 08/20/2017  . Insomnia 07/22/2017  . Hyperlipidemia 07/01/2017  . DDD (degenerative disc disease), lumbar 04/30/2017  . B12 deficiency 04/30/2017  . Gait instability 03/31/2017  . Allergic rhinitis 03/31/2017  . Nausea 03/31/2017  . GERD (gastroesophageal reflux disease) 03/31/2017  . Chronic systolic heart failure (HCC) 03/24/2017  . HTN (hypertension) 03/24/2017  . COPD (chronic obstructive pulmonary disease) (HCC) 03/24/2017  . Chronic pain 03/24/2017  . MRSA carrier 03/18/2017  . History of ventricular tachycardia 03/18/2017  . Vitamin D deficiency 01/06/2017  . Chronic atrial fibrillation 06/15/2016  . Depression 06/15/2016  . Varicose veins of lower extremity with inflammation 09/07/2011    Past Surgical History:  Procedure Laterality Date  . REPLACEMENT TOTAL KNEE    . TYMPANOSTOMY TUBE PLACEMENT Bilateral 03/19/2017    Prior to Admission medications   Medication Sig Start Date End Date Taking? Authorizing Provider  acetaminophen (TYLENOL) 500 MG tablet Take 2 tablets (1,000 mg total) by mouth 3 (three) times daily. 08/02/17  Yes Plonk, Chrissie Noa, MD  albuterol (PROVENTIL HFA;VENTOLIN HFA) 108 (90 Base) MCG/ACT inhaler Inhale 2 puffs into the lungs every  6 (six) hours as needed for wheezing or shortness of breath. 12/07/17  Yes Duanne Limerick, MD  albuterol (PROVENTIL) (2.5 MG/3ML) 0.083% nebulizer solution Take 3 mLs (2.5 mg total) by nebulization every 6 (six) hours as needed for wheezing or shortness of breath. 09/01/17  Yes Plonk, Chrissie Noa, MD  aspirin 81 MG chewable tablet Chew 81 mg by mouth daily.   Yes [provider]  atorvastatin (LIPITOR) 40 MG tablet Take 1 tablet (40 mg total) by mouth daily. 12/07/17  Yes  Duanne Limerick, MD  budesonide-formoterol (SYMBICORT) 160-4.5 MCG/ACT inhaler Inhale 2 puffs into the lungs 2 (two) times daily. 12/07/17  Yes Duanne Limerick, MD  carvedilol (COREG) 25 MG tablet Take 1 tablet (25 mg total) by mouth 2 (two) times daily. 12/07/17  Yes Duanne Limerick, MD  Cholecalciferol (VITAMIN D3) 1000 units CAPS Take 1 capsule by mouth daily.  07/24/17 07/24/18 Yes [provider]  Cyanocobalamin (B-12) 1000 MCG TABS Take 1 tablet by mouth daily. 04/05/17  Yes Plonk, Chrissie Noa, MD  fluticasone (FLONASE) 50 MCG/ACT nasal spray Place 2 sprays into both nostrils daily. 12/07/17  Yes Duanne Limerick, MD  gabapentin (NEURONTIN) 600 MG tablet Take 1,200 mg by mouth 2 (two) times daily. 04/28/18  Yes [provider]  ketoconazole (NIZORAL) 2 % shampoo Apply 1 application topically 3 (three) times a week.  08/30/17  Yes [provider]  losartan (COZAAR) 25 MG tablet Take 1 tablet (25 mg total) by mouth daily. 12/07/17  Yes Duanne Limerick, MD  mirtazapine (REMERON) 15 MG tablet Take 15 mg by mouth at bedtime.  03/24/18  Yes [provider]  omeprazole (PRILOSEC) 40 MG capsule Take 1 capsule (40 mg total) by mouth daily. 12/07/17 12/07/18 Yes Duanne Limerick, MD  promethazine (PHENERGAN) 25 MG tablet Take 0.5 tablets (12.5 mg total) by mouth every 6 (six) hours as needed for nausea. 08/16/17  Yes Plonk, Chrissie Noa, MD  sertraline (ZOLOFT) 50 MG tablet Take 50 mg by mouth daily. 04/22/18  Yes [provider]  spironolactone (ALDACTONE) 25 MG tablet Take 1 tablet (25 mg total) by mouth daily. 12/07/17  Yes Duanne Limerick, MD  torsemide (DEMADEX) 20 MG tablet Take 20 mg by mouth daily. 04/22/18  Yes [provider]  traMADol (ULTRAM) 50 MG tablet Take 50 mg by mouth 2 (two) times daily.  04/13/18  Yes [provider]  traZODone (DESYREL) 50 MG tablet Take 1 tablet (50 mg total) by mouth at bedtime as needed for sleep. 12/13/17  Yes Duanne Limerick,  MD  OXYGEN Inhale 4 L into the lungs.     [provider]    Allergies Sulfa antibiotics; Cymbalta [duloxetine hcl]; and Tramadol hcl  Family History  Problem Relation Age of Onset  . Heart disease Mother   . Heart disease Sister   . Cancer Sister     Social History Social History   Tobacco Use  . Smoking status: Former Smoker    Packs/day: 3.00    Years: 55.00    Pack years: 165.00    Types: Cigarettes    Last attempt to quit: 2018    Years since quitting: 1.8  . Smokeless tobacco: Never Used  . Tobacco comment: smoking cessation materials not required  Substance Use Topics  . Alcohol use: No  . Drug use: No    Review of Systems  Constitutional: Negative for fever. Eyes: Negative for visual changes. ENT: Negative for sore throat. Neck:  No neck pain  Cardiovascular: + chest pain. Respiratory: + shortness of breath and cough Gastrointestinal: Negative for abdominal pain, vomiting or diarrhea. Genitourinary: Negative for dysuria. Musculoskeletal: Negative for back pain. Skin: Negative for rash. Neurological: Negative for headaches, weakness or numbness. Psych: No SI or HI  ____________________________________________   PHYSICAL EXAM:  VITAL SIGNS: ED Triage Vitals  Enc Vitals Group     BP 05/28/18 1853 112/85     Pulse Rate 05/28/18 1853 (!) 105     Resp 05/28/18 1846 17     Temp 05/28/18 1853 98.2 F (36.8 C)     Temp Source 05/28/18 1853 Oral     SpO2 05/28/18 1853 100 %     Weight 05/28/18 1846 205 lb 0.4 oz (93 kg)     Height --      Head Circumference --      Peak Flow --      Pain Score 05/28/18 1846 8     Pain Loc --      Pain Edu? --      Excl. in GC? --     Constitutional: Alert and oriented. Well appearing and in no apparent distress. HEENT:      Head: Normocephalic and atraumatic.         Eyes: Conjunctivae are normal. Sclera is non-icteric.       Mouth/Throat: Mucous membranes are moist.       Neck: Supple with no signs  of meningismus. Cardiovascular: Tachycardic with regular rhythm. No murmurs, gallops, or rubs. 2+ symmetrical distal pulses are present in all extremities. No JVD. Respiratory: Increased work of breathing, satting 96% on 6 L nasal cannula, diffuse expiratory wheezes and decreased air movement bilaterally  gastrointestinal: Soft, non tender, and non distended with positive bowel sounds. No rebound or guarding. Musculoskeletal: Nontender with normal range of motion in all extremities. No edema, cyanosis, or erythema of extremities. Neurologic: Normal speech and language. Face is symmetric. Moving all extremities. No gross focal neurologic deficits are appreciated. Skin: Skin is warm, dry and intact. No rash noted. Psychiatric: Mood and affect are normal. Speech and behavior are normal.  ____________________________________________   LABS (all labs ordered are listed, but only abnormal results are displayed)  Labs Reviewed  CBC WITH DIFFERENTIAL/PLATELET - Abnormal; Notable for the following components:      Result Value   RBC 3.70 (*)    Hemoglobin 10.9 (*)    HCT 35.5 (*)    All other components within normal limits  BASIC METABOLIC PANEL - Abnormal; Notable for the following components:   Glucose, Bld 108 (*)    Creatinine, Ser 1.27 (*)    Calcium 8.8 (*)    GFR calc non Af Amer 41 (*)    GFR calc Af Amer 47 (*)    All other components within normal limits  TROPONIN I - Abnormal; Notable for the following components:   Troponin I 0.04 (*)    All other components within normal limits  BRAIN NATRIURETIC PEPTIDE - Abnormal; Notable for the following components:   B Natriuretic Peptide 120.0 (*)    All other components within normal limits  FIBRIN DERIVATIVES D-DIMER ([facility] ONLY) - Abnormal; Notable for the following components:   Fibrin derivatives D-dimer [facility]) 737.50 (*)    All other components within normal limits  TROPONIN I - Abnormal; Notable for the following components:    Troponin I 0.05 (*)    All other components within normal limits   ____________________________________________  EKG  ED ECG REPORT I, Nita Sickle, the attending physician, personally viewed and interpreted this ECG.  Sinus tachycardia, rate of 107, normal intervals, normal axis, no ST elevations or depressions, Q wave in anterior leads.  No significant changes when compared to prior. ____________________________________________  RADIOLOGY  I have personally reviewed the images performed during this visit and I agree with the Radiologist's read.   Interpretation by Radiologist:  Dg Chest 2 View  Result Date: 05/28/2018 CLINICAL DATA:  Shortness of breath. EXAM: CHEST - 2 VIEW COMPARISON:  Chest x-ray dated April 04, 2018. FINDINGS: Stable cardiomegaly. Normal pulmonary vascularity. Diffuse mildly coarsened interstitial markings are similar to prior studies and likely smoking-related. The lungs remain hyperinflated. No focal consolidation, pleural effusion, or pneumothorax. No acute osseous abnormality. IMPRESSION: 1.  No active cardiopulmonary disease.  Stable cardiomegaly. 2. COPD. Electronically Signed   By: Obie Dredge M.D.   On: 05/28/2018 20:04   Ct Angio Chest Pe W And/or Wo Contrast  Result Date: 05/28/2018 CLINICAL DATA:  72 year old female with difficulty breathing. Concern for pulmonary embolism. EXAM: CT ANGIOGRAPHY CHEST WITH CONTRAST TECHNIQUE: Multidetector CT imaging of the chest was performed using the standard protocol during bolus administration of intravenous contrast. Multiplanar CT image reconstructions and MIPs were obtained to evaluate the vascular anatomy. CONTRAST:  60mL ISOVUE-370 IOPAMIDOL (ISOVUE-370) INJECTION 76% COMPARISON:  Chest radiograph dated 05/28/2018 and CT dated 09/18/2017 FINDINGS: Cardiovascular: Borderline cardiomegaly. No pericardial effusion. There is 3 vessel coronary vascular calcification. There is advanced atherosclerotic  calcification of the thoracic aorta. There is decreased opacification of the proximal left subclavian artery which may be related to a degree of stenosis or atherosclerotic changes. This is similar to prior CT and likely chronic. There is no CT evidence of pulmonary embolism. Mediastinum/Nodes: No hilar or mediastinal adenopathy. Esophagus and the thyroid gland are grossly unremarkable. No mediastinal fluid collection. Lungs/Pleura: There is background of mild to moderate emphysema. There is no focal consolidation, pleural effusion, or pneumothorax. Several subpleural nodules measure up to 4 mm in the right lower lobe (series 6, image 64). Left lung base atelectatic changes/scarring noted. The central airways are patent. Upper Abdomen: Subcentimeter hypodense focus in the left lobe of the liver is not well characterized. The visualized upper abdomen is otherwise unremarkable. Musculoskeletal: No chest wall abnormality. No acute or significant osseous findings. T5 and T6 hemangioma. Review of the MIP images confirms the above findings. IMPRESSION: 1. No acute intrathoracic pathology. No CT evidence of pulmonary embolism. 2. Mild to moderate emphysema. No focal consolidation. 3. Several subpleural nodules measuring up to 4 mm in the right lower lobe. 4. Advanced atherosclerotic calcification of the thoracic aorta. Electronically Signed   By: Elgie Collard M.D.   On: 05/28/2018 22:13      ____________________________________________   PROCEDURES  Procedure(s) performed: None Procedures Critical Care performed: yes  CRITICAL CARE Performed by: Nita Sickle  ?  Total critical care time: 35 min  Critical care time was exclusive of separately billable procedures and treating other patients.  Critical care was necessary to treat or prevent imminent or life-threatening deterioration.  Critical care was time spent personally by me on the following activities: development of treatment plan with  patient and/or surrogate as well as nursing, discussions with consultants, evaluation of patient's response to treatment, examination of patient, obtaining history from patient or surrogate, ordering and performing treatments and interventions, ordering and review of laboratory studies, ordering and review of radiographic studies, pulse oximetry and re-evaluation of patient's condition.  ____________________________________________   INITIAL IMPRESSION / ASSESSMENT AND PLAN / ED COURSE  72 y.o. female with a history of CHF with EF of 20 to 30%, COPD on 4 L nasal cannula, hypertension, CAD who presents for evaluation of shortness of breath, productive cough, and chest pain. Ddx COPD exacerbation versus pneumonia versus CHF versus pneumothorax versus ACS versus PE.  EKG with no acute ischemic changes.  Chest x-ray and labs are pending including d-dimer.  Will start patient on 3 DuoNeb's and Solu-Medrol.    _________________________ 1:46 AM on 05/29/2018 -----------------------------------------  CT negative for PE or infiltrates.  Patient continued with a 6 L nasal cannula requirement and troponin trending up from 0.04-0.05 therefore recommendation for inpatient care.   As part of my medical decision making, I reviewed the following data within the electronic MEDICAL RECORD NUMBER Nursing notes reviewed and incorporated, Labs reviewed , EKG interpreted , Old EKG reviewed, Old chart reviewed, Radiograph reviewed , Discussed with admitting physician , Notes from prior ED visits and [place] Controlled Substance Database    Pertinent labs & imaging results that were available during my care of the patient were reviewed by me and considered in my medical decision making (see chart for details).    ____________________________________________   FINAL CLINICAL IMPRESSION(S) / ED DIAGNOSES  Final diagnoses:  Acute on chronic respiratory failure with hypoxia (HCC)      NEW MEDICATIONS STARTED DURING  THIS VISIT:  ED Discharge Orders    None       Note:  This document was prepared using Dragon voice recognition software and may include unintentional dictation errors.    Nita Sickle, MD 05/29/18 [phone]

## 2018-05-28 NOTE — ED Notes (Signed)
Danelle Earthly RN to bedside to attempt PIV insertion

## 2018-05-28 NOTE — ED Notes (Signed)
IV access attempted by this RN x 1 and by EMS x 1. Unable to obtain access. Dr. Don Perking to try by U/S.

## 2018-05-28 NOTE — ED Triage Notes (Signed)
Pt to ED via [facility] from home for breathing difficulty. Per EMS pt has been having trouble breathing since 1000. Pt states that symptoms have not gotten worse but have not gotten any better either. Pt was given 1 albuterol treatment at home by family prior to EMS arrival. Pt states that Shortness of breath is worse with activity. Pt has non-productive cough. Pt has hx/o CHF and COPD. Pt is on 4 liter of O2 chronically. When FD arrived pts SpO2 was 83%, pts O2 was increased to 6 liters with improvement in SpO2 to 95-96%. Pt is in NAD at this time.

## 2018-05-29 ENCOUNTER — Observation Stay: Admit: 2018-05-29 | Payer: Medicare Other

## 2018-05-29 LAB — BASIC METABOLIC PANEL
ANION GAP: 13 (ref 5–15)
BUN: 17 mg/dL (ref 8–23)
CHLORIDE: 101 mmol/L (ref 98–111)
CO2: 27 mmol/L (ref 22–32)
Calcium: 9.4 mg/dL (ref 8.9–10.3)
Creatinine, Ser: 1.25 mg/dL — ABNORMAL HIGH (ref 0.44–1.00)
GFR calc Af Amer: 48 mL/min — ABNORMAL LOW (ref >60)
GFR, EST NON AFRICAN AMERICAN: 41 mL/min — AB (ref >60)
Glucose, Bld: 179 mg/dL — ABNORMAL HIGH (ref 70–99)
Potassium: 4.5 mmol/L (ref 3.5–5.1)
SODIUM: 141 mmol/L (ref 135–145)

## 2018-05-29 LAB — TROPONIN I
TROPONIN I: 0.03 ng/mL — AB (ref <0.03)
TROPONIN I: 0.05 ng/mL — AB (ref <0.03)
Troponin I: 0.03 ng/mL (ref <0.03)

## 2018-05-29 LAB — CBC
HEMATOCRIT: 34.6 % — AB (ref 36.0–46.0)
HEMOGLOBIN: 10.9 g/dL — AB (ref 12.0–15.0)
MCH: 29.3 pg (ref 26.0–34.0)
MCHC: 31.5 g/dL (ref 30.0–36.0)
MCV: 93 fL (ref 80.0–100.0)
Platelets: 186 10*3/uL (ref 150–400)
RBC: 3.72 MIL/uL — AB (ref 3.87–5.11)
RDW: 14.5 % (ref 11.5–15.5)
WBC: 4.7 10*3/uL (ref 4.0–10.5)
nRBC: 0 % (ref 0.0–0.2)

## 2018-05-29 LAB — MRSA PCR SCREENING: MRSA BY PCR: NEGATIVE

## 2018-05-29 MED ORDER — LEVALBUTEROL HCL 1.25 MG/0.5ML IN NEBU
1.2500 mg | INHALATION_SOLUTION | Freq: Four times a day (QID) | RESPIRATORY_TRACT | Status: DC | PRN
  Filled 2018-05-29: qty 0.5

## 2018-05-29 MED ORDER — AZITHROMYCIN 250 MG PO TABS
500.0000 mg | ORAL_TABLET | Freq: Every day | ORAL | Status: DC
  Administered 2018-05-29: 500 mg via ORAL
  Filled 2018-05-29: qty 2

## 2018-05-29 MED ORDER — PREDNISONE 10 MG PO TABS: 50.0000 mg | ORAL_TABLET | Freq: Every day | ORAL | 0 refills | Status: DC

## 2018-05-29 MED ORDER — ENOXAPARIN SODIUM 40 MG/0.4ML ~~LOC~~ SOLN: 40.0000 mg | SUBCUTANEOUS | Status: DC

## 2018-05-29 MED ORDER — METHYLPREDNISOLONE SODIUM SUCC 125 MG IJ SOLR
60.0000 mg | Freq: Four times a day (QID) | INTRAMUSCULAR | Status: DC
  Administered 2018-05-29 (×2): 60 mg via INTRAVENOUS
  Filled 2018-05-29 (×2): qty 2

## 2018-05-29 MED ORDER — FUROSEMIDE 10 MG/ML IJ SOLN
40.0000 mg | Freq: Once | INTRAMUSCULAR | Status: AC
  Administered 2018-05-29: 40 mg via INTRAVENOUS
  Filled 2018-05-29: qty 4

## 2018-05-29 MED ORDER — ATORVASTATIN CALCIUM 20 MG PO TABS
40.0000 mg | ORAL_TABLET | Freq: Every day | ORAL | Status: DC
  Administered 2018-05-29: 40 mg via ORAL
  Filled 2018-05-29: qty 2

## 2018-05-29 MED ORDER — METHYLPREDNISOLONE SODIUM SUCC 125 MG IJ SOLR: 60.0000 mg | Freq: Two times a day (BID) | INTRAMUSCULAR | Status: DC

## 2018-05-29 MED ORDER — SERTRALINE HCL 50 MG PO TABS: 50.0000 mg | ORAL_TABLET | Freq: Every day | ORAL | Status: DC

## 2018-05-29 MED ORDER — ACETAMINOPHEN 325 MG PO TABS
650.0000 mg | ORAL_TABLET | Freq: Four times a day (QID) | ORAL | Status: DC | PRN
  Administered 2018-05-29 (×2): 650 mg via ORAL
  Filled 2018-05-29: qty 2

## 2018-05-29 MED ORDER — ONDANSETRON HCL 4 MG PO TABS
4.0000 mg | ORAL_TABLET | Freq: Four times a day (QID) | ORAL | Status: DC | PRN
  Administered 2018-05-29: 4 mg via ORAL
  Filled 2018-05-29: qty 1

## 2018-05-29 MED ORDER — FUROSEMIDE 40 MG PO TABS
80.0000 mg | ORAL_TABLET | Freq: Every day | ORAL | Status: DC
  Administered 2018-05-29: 80 mg via ORAL
  Filled 2018-05-29: qty 2

## 2018-05-29 MED ORDER — ONDANSETRON HCL 4 MG/2ML IJ SOLN: 4.0000 mg | Freq: Four times a day (QID) | INTRAMUSCULAR | Status: DC | PRN

## 2018-05-29 MED ORDER — GABAPENTIN 600 MG PO TABS: 1200.0000 mg | ORAL_TABLET | Freq: Two times a day (BID) | ORAL | Status: DC

## 2018-05-29 MED ORDER — PANTOPRAZOLE SODIUM 40 MG PO TBEC
40.0000 mg | DELAYED_RELEASE_TABLET | Freq: Every day | ORAL | Status: DC
  Administered 2018-05-29: 40 mg via ORAL
  Filled 2018-05-29: qty 1

## 2018-05-29 MED ORDER — IPRATROPIUM-ALBUTEROL 0.5-2.5 (3) MG/3ML IN SOLN: 3.0000 mL | Freq: Four times a day (QID) | RESPIRATORY_TRACT | Status: DC | PRN

## 2018-05-29 MED ORDER — IPRATROPIUM-ALBUTEROL 0.5-2.5 (3) MG/3ML IN SOLN: 3.0000 mL | Freq: Four times a day (QID) | RESPIRATORY_TRACT | 1 refills | Status: DC | PRN

## 2018-05-29 MED ORDER — ACETAMINOPHEN 650 MG RE SUPP: 650.0000 mg | Freq: Four times a day (QID) | RECTAL | Status: DC | PRN

## 2018-05-29 MED ORDER — MOMETASONE FURO-FORMOTEROL FUM 200-5 MCG/ACT IN AERO
2.0000 | INHALATION_SPRAY | Freq: Two times a day (BID) | RESPIRATORY_TRACT | Status: DC
  Administered 2018-05-29: 2 via RESPIRATORY_TRACT
  Filled 2018-05-29: qty 8.8

## 2018-05-29 MED ORDER — ASPIRIN 81 MG PO CHEW
81.0000 mg | CHEWABLE_TABLET | Freq: Every day | ORAL | Status: DC
  Administered 2018-05-29: 81 mg via ORAL
  Filled 2018-05-29: qty 1

## 2018-05-29 MED ORDER — TORSEMIDE 20 MG PO TABS
20.0000 mg | ORAL_TABLET | Freq: Every day | ORAL | Status: DC
  Filled 2018-05-29: qty 1

## 2018-05-29 NOTE — Progress Notes (Signed)
Discharge order received. Patient is alert and oriented. Vital signs stable . No signs of acute distress. Discharge instructions given. Patient verbalized understanding. No other issues noted at this time.   

## 2018-05-29 NOTE — Discharge Summary (Signed)
[facility] [place] [facility]   PATIENT NAME: [name] [name]    MR#:  [id number]  DATE OF BIRTH:  August 22, 1945  DATE OF ADMISSION:  05/28/2018 ADMITTING PHYSICIAN: Oralia Manis, MD  DATE OF DISCHARGE: 05/29/2018  PRIMARY CARE PHYSICIAN: Barbette Reichmann, MD    ADMISSION DIAGNOSIS:  Acute on chronic respiratory failure with hypoxia (HCC) [J96.21]  DISCHARGE DIAGNOSIS:  Acute on chronic hypoxic respiratory failure secondary to COPD exacerbation  SECONDARY DIAGNOSIS:   Past Medical History:  Diagnosis Date  . Asthma   . Chronic combined systolic (congestive) and diastolic (congestive) heart failure (HCC)    a. 02/2017 Echo: EF 25-30%, Gr2 DD.  Marland Kitchen COPD (chronic obstructive pulmonary disease) (HCC)    a. On supplemental O2 @ home.  Marland Kitchen Hypertension   . Lung nodule   . Narrow complex tachycardia (HCC)    a. 02/2017 - ? NSVT vs Afib-->placed on amio.  No OAC.  Marland Kitchen NICM (nonischemic cardiomyopathy) (HCC)    a. 02/2017 Echo: Ef 25-30%, mild conc LVH, Gr2 DD, sev MR, mod dil LA;  b. 02/2016 Cath [facility]): nonobs dzs.  . Non-obstructive CAD    a. 02/2016 Cath [facility]): LM 30, LAD 20ost/p, 78m, 30d, D1 50p, D2 30, D3 20, RI 20, LCX 40p, OM1 30, OM2 20, RCA 20 diffuse, RPL2 30, RPL3 30.  Marland Kitchen Severe mitral regurgitation    a. 02/2017 Echo: Sev MR.    HOSPITAL COURSE:   [name] [name]  is a 72 y.o. female who presents with chief complaint as above.  Patient presents with progressive shortness of breath over the course of the last 24 hours.  She has a history of COPD and has been hospitalized with exacerbation in the past.  She states it has been about a year since she was last admitted for COPD exacerbation.  *COPD with acute exacerbation (HCC)  --IV Solu-Medrol, duo nebs,PRN antitussive, continue home meds -PO steroid taper. Patient currently not wheezing. She is having mild headache -no indication for antibiotics  *  Chronic systolic heart failure (HCC) -continue home  medications, patient has very mildly elevated troponins and  feel this is likely due to demand ischemia in the setting of her COPD flare.   -patient currently having no chest pain. Her troponin's are flat.   *  HTN (hypertension) -home dose antihypertensives  *  GERD (gastroesophageal reflux disease) -Home dose PPI   * Hyperlipidemia -Home dose antilipid  Patient overall feels at baseline. She would like to go home later today. Family agreeable with patient will see Dr. Marcello Fennel as outpatient on her scheduled appointment November 8  CONSULTS OBTAINED:    DRUG ALLERGIES:   Allergies  Allergen Reactions  . Sulfa Antibiotics Itching    Pt states she gets "itching inside and out"  . Cymbalta [Duloxetine Hcl] Nausea Only  . Tramadol Hcl Other (See Comments)    Falls, confusion    DISCHARGE MEDICATIONS:   Allergies as of 05/29/2018      Reactions   Sulfa Antibiotics Itching   Pt states she gets "itching inside and out"   Cymbalta [duloxetine Hcl] Nausea Only   Tramadol Hcl Other (See Comments)   Falls, confusion      Medication List    TAKE these medications   acetaminophen 500 MG tablet Commonly known as:  TYLENOL Take 2 tablets (1,000 mg total) by mouth 3 (three) times daily.   albuterol (2.5 MG/3ML) 0.083% nebulizer solution Commonly known as:  PROVENTIL Take 3  mLs (2.5 mg total) by nebulization every 6 (six) hours as needed for wheezing or shortness of breath.   albuterol 108 (90 Base) MCG/ACT inhaler Commonly known as:  PROVENTIL HFA;VENTOLIN HFA Inhale 2 puffs into the lungs every 6 (six) hours as needed for wheezing or shortness of breath.   aspirin 81 MG chewable tablet Chew 81 mg by mouth daily.   atorvastatin 40 MG tablet Commonly known as:  LIPITOR Take 1 tablet (40 mg total) by mouth daily.   B-12 1000 MCG Tabs Take 1 tablet by mouth daily.   budesonide-formoterol 160-4.5 MCG/ACT inhaler Commonly known as:  SYMBICORT Inhale 2 puffs into the lungs 2  (two) times daily.   carvedilol 25 MG tablet Commonly known as:  COREG Take 1 tablet (25 mg total) by mouth 2 (two) times daily.   fluticasone 50 MCG/ACT nasal spray Commonly known as:  FLONASE Place 2 sprays into both nostrils daily.   gabapentin 600 MG tablet Commonly known as:  NEURONTIN Take 1,200 mg by mouth 2 (two) times daily.   ketoconazole 2 % shampoo Commonly known as:  NIZORAL Apply 1 application topically 3 (three) times a week.   losartan 25 MG tablet Commonly known as:  COZAAR Take 1 tablet (25 mg total) by mouth daily.   mirtazapine 15 MG tablet Commonly known as:  REMERON Take 15 mg by mouth at bedtime.   omeprazole 40 MG capsule Commonly known as:  PRILOSEC Take 1 capsule (40 mg total) by mouth daily.   OXYGEN Inhale 4 L into the lungs.   predniSONE 10 MG tablet Commonly known as:  DELTASONE Take 5 tablets (50 mg total) by mouth daily with breakfast. Take 50 mg daily taper by 10 mg daily then stop   promethazine 25 MG tablet Commonly known as:  PHENERGAN Take 0.5 tablets (12.5 mg total) by mouth every 6 (six) hours as needed for nausea.   sertraline 50 MG tablet Commonly known as:  ZOLOFT Take 50 mg by mouth daily.   spironolactone 25 MG tablet Commonly known as:  ALDACTONE Take 1 tablet (25 mg total) by mouth daily.   torsemide 20 MG tablet Commonly known as:  DEMADEX Take 20 mg by mouth daily.   traMADol 50 MG tablet Commonly known as:  ULTRAM Take 50 mg by mouth 2 (two) times daily.   traZODone 50 MG tablet Commonly known as:  DESYREL Take 1 tablet (50 mg total) by mouth at bedtime as needed for sleep.   Vitamin D3 1000 units Caps Take 1 capsule by mouth daily.       If you experience worsening of your admission symptoms, develop shortness of breath, life threatening emergency, suicidal or homicidal thoughts you must seek medical attention immediately by calling 911 or calling your MD immediately  if symptoms less severe.  You  Must read complete instructions/literature along with all the possible adverse reactions/side effects for all the Medicines you take and that have been prescribed to you. Take any new Medicines after you have completely understood and accept all the possible adverse reactions/side effects.   Please note  You were cared for by a hospitalist during your hospital stay. If you have any questions about your discharge medications or the care you received while you were in the hospital after you are discharged, you can call the unit and asked to speak with the hospitalist on call if the hospitalist that took care of you is not available. Once you are discharged, your primary care  physician will handle any further medical issues. Please note that NO REFILLS for any discharge medications will be authorized once you are discharged, as it is imperative that you return to your primary care physician (or establish a relationship with a primary care physician if you do not have one) for your aftercare needs so that they can reassess your need for medications and monitor your lab values. Today   SUBJECTIVE   Mild headache. Better. Currently not wheezing VITAL SIGNS:  Blood pressure (!) 101/53, pulse 88, temperature 97.7 F (36.5 C), temperature source Oral, resp. rate 20, weight 93 kg, SpO2 94 %.  I/O:    Intake/Output Summary (Last 24 hours) at 05/29/2018 1012 Last data filed at 05/29/2018 0500 Gross per 24 hour  Intake -  Output 750 ml  Net -750 ml    PHYSICAL EXAMINATION:  GENERAL:  72 y.o.-year-old patient lying in the bed with no acute distress. Morbidly obese EYES: Pupils equal, round, reactive to light and accommodation. No scleral icterus. Extraocular muscles intact.  HEENT: Head atraumatic, normocephalic. Oropharynx and nasopharynx clear.  NECK:  Supple, no jugular venous distention. No thyroid enlargement, no tenderness.  LUNGS: Normal breath sounds bilaterally, no wheezing, rales,rhonchi or  crepitation. No use of accessory muscles of respiration.  CARDIOVASCULAR: S1, S2 normal. No murmurs, rubs, or gallops.  ABDOMEN: Soft, non-tender, non-distended. Bowel sounds present. No organomegaly or mass.  EXTREMITIES: No pedal edema, cyanosis, or clubbing.  NEUROLOGIC: Cranial nerves II through XII are intact. Muscle strength 5/5 in all extremities. Sensation intact. Gait not checked.  PSYCHIATRIC: The patient is alert and oriented x 3.  SKIN: No obvious rash, lesion, or ulcer.   DATA REVIEW:   CBC  Recent Labs  Lab 05/29/18 0504  WBC 4.7  HGB 10.9*  HCT 34.6*  PLT 186    Chemistries  Recent Labs  Lab 05/29/18 0504  NA 141  K 4.5  CL 101  CO2 27  GLUCOSE 179*  BUN 17  CREATININE 1.25*  CALCIUM 9.4    Microbiology Results   Recent Results (from the past 240 hour(s))  MRSA PCR Screening     Status: None   Collection Time: 05/29/18  2:45 AM  Result Value Ref Range Status   MRSA by PCR NEGATIVE NEGATIVE Final    Comment:        The GeneXpert MRSA Assay (FDA approved for NASAL specimens only), is one component of a comprehensive MRSA colonization surveillance program. It is not intended to diagnose MRSA infection nor to guide or monitor treatment for MRSA infections. Performed at [facility], [address], [place], [place] [address]     RADIOLOGY:  Dg Chest 2 View  Result Date: 05/28/2018 CLINICAL DATA:  Shortness of breath. EXAM: CHEST - 2 VIEW COMPARISON:  Chest x-ray dated April 04, 2018. FINDINGS: Stable cardiomegaly. Normal pulmonary vascularity. Diffuse mildly coarsened interstitial markings are similar to prior studies and likely smoking-related. The lungs remain hyperinflated. No focal consolidation, pleural effusion, or pneumothorax. No acute osseous abnormality. IMPRESSION: 1.  No active cardiopulmonary disease.  Stable cardiomegaly. 2. COPD. Electronically Signed   By: Obie Dredge M.D.   On: 05/28/2018 20:04   Ct Angio Chest  Pe W And/or Wo Contrast  Result Date: 05/28/2018 CLINICAL DATA:  72 year old female with difficulty breathing. Concern for pulmonary embolism. EXAM: CT ANGIOGRAPHY CHEST WITH CONTRAST TECHNIQUE: Multidetector CT imaging of the chest was performed using the standard protocol during bolus administration of intravenous contrast. Multiplanar CT image  reconstructions and MIPs were obtained to evaluate the vascular anatomy. CONTRAST:  87mL ISOVUE-370 IOPAMIDOL (ISOVUE-370) INJECTION 76% COMPARISON:  Chest radiograph dated 05/28/2018 and CT dated 09/18/2017 FINDINGS: Cardiovascular: Borderline cardiomegaly. No pericardial effusion. There is 3 vessel coronary vascular calcification. There is advanced atherosclerotic calcification of the thoracic aorta. There is decreased opacification of the proximal left subclavian artery which may be related to a degree of stenosis or atherosclerotic changes. This is similar to prior CT and likely chronic. There is no CT evidence of pulmonary embolism. Mediastinum/Nodes: No hilar or mediastinal adenopathy. Esophagus and the thyroid gland are grossly unremarkable. No mediastinal fluid collection. Lungs/Pleura: There is background of mild to moderate emphysema. There is no focal consolidation, pleural effusion, or pneumothorax. Several subpleural nodules measure up to 4 mm in the right lower lobe (series 6, image 64). Left lung base atelectatic changes/scarring noted. The central airways are patent. Upper Abdomen: Subcentimeter hypodense focus in the left lobe of the liver is not well characterized. The visualized upper abdomen is otherwise unremarkable. Musculoskeletal: No chest wall abnormality. No acute or significant osseous findings. T5 and T6 hemangioma. Review of the MIP images confirms the above findings. IMPRESSION: 1. No acute intrathoracic pathology. No CT evidence of pulmonary embolism. 2. Mild to moderate emphysema. No focal consolidation. 3. Several subpleural nodules  measuring up to 4 mm in the right lower lobe. 4. Advanced atherosclerotic calcification of the thoracic aorta. Electronically Signed   By: Elgie Collard M.D.   On: 05/28/2018 22:13     Management plans discussed with the patient, family and they are in agreement.  CODE STATUS:     Code Status Orders  (From admission, onward)         Start     Ordered   05/29/18 0208  Full code  Continuous     05/29/18 0207        Code Status History    Date Active Date Inactive Code Status Order ID Comments User Context   03/13/2017 1205 03/18/2017 1809 Full Code [id number]  Gracelyn Nurse, MD Inpatient   06/03/2016 1045 06/05/2016 1630 Full Code [id number]  Shaune Pollack, MD Inpatient      TOTAL TIME TAKING CARE OF THIS PATIENT: 40* minutes.    Enedina Finner M.D on 05/29/2018 at 10:12 AM  Between 7am to 6pm - Pager - [phone] After 6pm go to [email or web address] - Social research officer, government  [facility]  Office  [phone]  CC: Primary care physician; Barbette Reichmann, MD

## 2018-05-29 NOTE — H&P (Signed)
[facility] - [place] at [facility]   PATIENT NAME: [name] [name]    MR#:  [id number]  DATE OF BIRTH:  01/02/1946  DATE OF ADMISSION:  05/28/2018  PRIMARY CARE PHYSICIAN: Barbette Reichmann, MD   REQUESTING/REFERRING PHYSICIAN: Don Perking, MD  CHIEF COMPLAINT:   Chief Complaint  Patient presents with  . Shortness of Breath    HISTORY OF PRESENT ILLNESS:  [name] [name]  is a 72 y.o. female who presents with chief complaint as above.  Patient presents with progressive shortness of breath over the course of the last 24 hours.  She has a history of COPD and has been hospitalized with exacerbation in the past.  She states it has been about a year since she was last admitted for COPD exacerbation.  She experienced significant wheezing at home, and did not feel like her medications were helping to control her symptoms today.  Here in the ED her work-up is consistent with COPD exacerbation.  She also has a history of heart failure, though she does not have edema on her x-ray tonight.  Her troponins were very mildly elevated.  Hospitalist were called for admission and further evaluation and treatment  PAST MEDICAL HISTORY:   Past Medical History:  Diagnosis Date  . Asthma   . Chronic combined systolic (congestive) and diastolic (congestive) heart failure (HCC)    a. 02/2017 Echo: EF 25-30%, Gr2 DD.  Marland Kitchen COPD (chronic obstructive pulmonary disease) (HCC)    a. On supplemental O2 @ home.  Marland Kitchen Hypertension   . Lung nodule   . Narrow complex tachycardia (HCC)    a. 02/2017 - ? NSVT vs Afib-->placed on amio.  No OAC.  Marland Kitchen NICM (nonischemic cardiomyopathy) (HCC)    a. 02/2017 Echo: Ef 25-30%, mild conc LVH, Gr2 DD, sev MR, mod dil LA;  b. 02/2016 Cath [facility]): nonobs dzs.  . Non-obstructive CAD    a. 02/2016 Cath [facility]): LM 30, LAD 20ost/p, 18m, 30d, D1 50p, D2 30, D3 20, RI 20, LCX 40p, OM1 30, OM2 20, RCA 20 diffuse, RPL2 30, RPL3 30.  Marland Kitchen Severe mitral regurgitation    a. 02/2017 Echo: Sev  MR.     PAST SURGICAL HISTORY:   Past Surgical History:  Procedure Laterality Date  . REPLACEMENT TOTAL KNEE    . TYMPANOSTOMY TUBE PLACEMENT Bilateral 03/19/2017     SOCIAL HISTORY:   Social History   Tobacco Use  . Smoking status: Former Smoker    Packs/day: 3.00    Years: 55.00    Pack years: 165.00    Types: Cigarettes    Last attempt to quit: 2018    Years since quitting: 1.8  . Smokeless tobacco: Never Used  . Tobacco comment: smoking cessation materials not required  Substance Use Topics  . Alcohol use: No     FAMILY HISTORY:   Family History  Problem Relation Age of Onset  . Heart disease Mother   . Heart disease Sister   . Cancer Sister      DRUG ALLERGIES:   Allergies  Allergen Reactions  . Sulfa Antibiotics Itching    Pt states she gets "itching inside and out"  . Cymbalta [Duloxetine Hcl] Nausea Only  . Tramadol Hcl Other (See Comments)    Falls, confusion    MEDICATIONS AT HOME:   Prior to Admission medications   Medication Sig Start Date End Date Taking? Authorizing Provider  acetaminophen (TYLENOL) 500 MG tablet Take 2 tablets (1,000 mg total) by mouth 3 (  three) times daily. 08/02/17   Plonk, Chrissie Noa, MD  albuterol (PROVENTIL HFA;VENTOLIN HFA) 108 (90 Base) MCG/ACT inhaler Inhale 2 puffs into the lungs every 6 (six) hours as needed for wheezing or shortness of breath. 12/07/17   Duanne Limerick, MD  albuterol (PROVENTIL) (2.5 MG/3ML) 0.083% nebulizer solution Take 3 mLs (2.5 mg total) by nebulization every 6 (six) hours as needed for wheezing or shortness of breath. 09/01/17   Plonk, Chrissie Noa, MD  aspirin 81 MG chewable tablet Chew 81 mg by mouth daily.    [provider]  atorvastatin (LIPITOR) 40 MG tablet Take 1 tablet (40 mg total) by mouth daily. 12/07/17   Duanne Limerick, MD  budesonide-formoterol (SYMBICORT) 160-4.5 MCG/ACT inhaler Inhale 2 puffs into the lungs 2 (two) times daily. 12/07/17   Duanne Limerick, MD  carvedilol  (COREG) 25 MG tablet Take 1 tablet (25 mg total) by mouth 2 (two) times daily. 12/07/17   Duanne Limerick, MD  Cholecalciferol (VITAMIN D3) 1000 units CAPS Take 1 capsule by mouth daily.  07/24/17 07/24/18  [provider]  Cyanocobalamin (B-12) 1000 MCG TABS Take 1 tablet by mouth daily. 04/05/17   Plonk, Chrissie Noa, MD  fluticasone (FLONASE) 50 MCG/ACT nasal spray Place 2 sprays into both nostrils daily. 12/07/17   Duanne Limerick, MD  furosemide (LASIX) 80 MG tablet Take 1 tablet (80 mg total) by mouth daily. 12/07/17   Duanne Limerick, MD  ketoconazole (NIZORAL) 2 % shampoo U TO [facility] SCALP 3 TIMES PER WEEK 08/30/17   [provider]  losartan (COZAAR) 25 MG tablet Take 1 tablet (25 mg total) by mouth daily. 12/07/17   Duanne Limerick, MD  mirtazapine (REMERON) 15 MG tablet  03/24/18   [provider]  omeprazole (PRILOSEC) 40 MG capsule Take 1 capsule (40 mg total) by mouth daily. 12/07/17 12/07/18  Duanne Limerick, MD  OXYGEN Inhale 4 L into the lungs.     [provider]  predniSONE (DELTASONE) 10 MG tablet  04/13/18   [provider]  promethazine (PHENERGAN) 25 MG tablet Take 0.5 tablets (12.5 mg total) by mouth every 6 (six) hours as needed for nausea. 08/16/17   Plonk, Chrissie Noa, MD  spironolactone (ALDACTONE) 25 MG tablet Take 1 tablet (25 mg total) by mouth daily. 12/07/17   Duanne Limerick, MD  traMADol Janean Sark) 50 MG tablet  04/13/18   [provider]  traZODone (DESYREL) 50 MG tablet Take 1 tablet (50 mg total) by mouth at bedtime as needed for sleep. 12/13/17   Duanne Limerick, MD    REVIEW OF SYSTEMS:  Review of Systems  Constitutional: Negative for chills, fever, malaise/fatigue and weight loss.  HENT: Negative for ear pain, hearing loss and tinnitus.   Eyes: Negative for blurred vision, double vision, pain and redness.  Respiratory: Positive for cough, shortness of breath and wheezing. Negative for hemoptysis.   Cardiovascular: Negative for  chest pain, palpitations, orthopnea and leg swelling.  Gastrointestinal: Negative for abdominal pain, constipation, diarrhea, nausea and vomiting.  Genitourinary: Negative for dysuria, frequency and hematuria.  Musculoskeletal: Negative for back pain, joint pain and neck pain.  Skin:       No acne, rash, or lesions  Neurological: Negative for dizziness, tremors, focal weakness and weakness.  Endo/Heme/Allergies: Negative for polydipsia. Does not bruise/bleed easily.  Psychiatric/Behavioral: Negative for depression. The patient is not nervous/anxious and does not have insomnia.      VITAL SIGNS:   Vitals:  05/28/18 2200 05/28/18 2230 05/28/18 2300 05/28/18 2330  BP: 99/63 121/61 108/60 (!) 102/51  Pulse: (!) 103 (!) 116    Resp:      Temp:      TempSrc:      SpO2: 99% 92%    Weight:       Wt Readings from Last 3 Encounters:  05/28/18 93 kg  04/19/18 93 kg  04/04/18 93 kg    PHYSICAL EXAMINATION:  Physical Exam  Vitals reviewed. Constitutional: She is oriented to person, place, and time. She appears well-developed and well-nourished. No distress.  HENT:  Head: Normocephalic and atraumatic.  Mouth/Throat: Oropharynx is clear and moist.  Eyes: Pupils are equal, round, and reactive to light. Conjunctivae and EOM are normal. No scleral icterus.  Neck: Normal range of motion. Neck supple. No JVD present. No thyromegaly present.  Cardiovascular: Regular rhythm and intact distal pulses. Exam reveals no gallop and no friction rub.  No murmur heard. Tachycardic  Respiratory: Effort normal. No respiratory distress. She has wheezes. She has no rales.  GI: Soft. Bowel sounds are normal. She exhibits no distension. There is no tenderness.  Musculoskeletal: Normal range of motion. She exhibits no edema.  No arthritis, no gout  Lymphadenopathy:    She has no cervical adenopathy.  Neurological: She is alert and oriented to person, place, and time. No cranial nerve deficit.  No  dysarthria, no aphasia  Skin: Skin is warm and dry. No rash noted. No erythema.  Psychiatric: She has a normal mood and affect. Her behavior is normal. Judgment and thought content normal.    LABORATORY PANEL:   CBC Recent Labs  Lab 05/28/18 1951  WBC 6.3  HGB 10.9*  HCT 35.5*  PLT 199   ------------------------------------------------------------------------------------------------------------------  Chemistries  Recent Labs  Lab 05/28/18 1951  NA 143  K 4.1  CL 103  CO2 31  GLUCOSE 108*  BUN 16  CREATININE 1.27*  CALCIUM 8.8*   ------------------------------------------------------------------------------------------------------------------  Cardiac Enzymes Recent Labs  Lab 05/29/18 0000  TROPONINI 0.05*   ------------------------------------------------------------------------------------------------------------------  RADIOLOGY:  Dg Chest 2 View  Result Date: 05/28/2018 CLINICAL DATA:  Shortness of breath. EXAM: CHEST - 2 VIEW COMPARISON:  Chest x-ray dated April 04, 2018. FINDINGS: Stable cardiomegaly. Normal pulmonary vascularity. Diffuse mildly coarsened interstitial markings are similar to prior studies and likely smoking-related. The lungs remain hyperinflated. No focal consolidation, pleural effusion, or pneumothorax. No acute osseous abnormality. IMPRESSION: 1.  No active cardiopulmonary disease.  Stable cardiomegaly. 2. COPD. Electronically Signed   By: Obie Dredge M.D.   On: 05/28/2018 20:04   Ct Angio Chest Pe W And/or Wo Contrast  Result Date: 05/28/2018 CLINICAL DATA:  72 year old female with difficulty breathing. Concern for pulmonary embolism. EXAM: CT ANGIOGRAPHY CHEST WITH CONTRAST TECHNIQUE: Multidetector CT imaging of the chest was performed using the standard protocol during bolus administration of intravenous contrast. Multiplanar CT image reconstructions and MIPs were obtained to evaluate the vascular anatomy. CONTRAST:  41mL ISOVUE-370  IOPAMIDOL (ISOVUE-370) INJECTION 76% COMPARISON:  Chest radiograph dated 05/28/2018 and CT dated 09/18/2017 FINDINGS: Cardiovascular: Borderline cardiomegaly. No pericardial effusion. There is 3 vessel coronary vascular calcification. There is advanced atherosclerotic calcification of the thoracic aorta. There is decreased opacification of the proximal left subclavian artery which may be related to a degree of stenosis or atherosclerotic changes. This is similar to prior CT and likely chronic. There is no CT evidence of pulmonary embolism. Mediastinum/Nodes: No hilar or mediastinal adenopathy. Esophagus and the thyroid gland are  grossly unremarkable. No mediastinal fluid collection. Lungs/Pleura: There is background of mild to moderate emphysema. There is no focal consolidation, pleural effusion, or pneumothorax. Several subpleural nodules measure up to 4 mm in the right lower lobe (series 6, image 64). Left lung base atelectatic changes/scarring noted. The central airways are patent. Upper Abdomen: Subcentimeter hypodense focus in the left lobe of the liver is not well characterized. The visualized upper abdomen is otherwise unremarkable. Musculoskeletal: No chest wall abnormality. No acute or significant osseous findings. T5 and T6 hemangioma. Review of the MIP images confirms the above findings. IMPRESSION: 1. No acute intrathoracic pathology. No CT evidence of pulmonary embolism. 2. Mild to moderate emphysema. No focal consolidation. 3. Several subpleural nodules measuring up to 4 mm in the right lower lobe. 4. Advanced atherosclerotic calcification of the thoracic aorta. Electronically Signed   By: Elgie Collard M.D.   On: 05/28/2018 22:13    EKG:   Orders placed or performed during the hospital encounter of 05/28/18  . ED EKG  . ED EKG  . EKG 12-Lead  . EKG 12-Lead  . EKG 12-Lead  . EKG 12-Lead    IMPRESSION AND PLAN:  Principal Problem:   COPD with acute exacerbation (HCC) -IV Solu-Medrol,  duo nebs, azithromycin, PRN antitussive, continue home meds Active Problems:   Chronic systolic heart failure (HCC) -continue home medications, patient has very mildly elevated troponins and I feel this is likely due to demand ischemia in the setting of her COPD flare.  We will trend her cardiac enzymes tonight.  If they do not elevate further I do not feel that she would need a cardiology consult, but if she has any change or develops any cardiac symptoms then cardiology could be consulted.   HTN (hypertension) -home dose antihypertensives   GERD (gastroesophageal reflux disease) -Home dose PPI   Hyperlipidemia -Home dose antilipid  Chart review performed and case discussed with ED provider. Labs, imaging and/or ECG reviewed by provider and discussed with patient/family. Management plans discussed with the patient and/or family.  DVT PROPHYLAXIS: SubQ lovenox   GI PROPHYLAXIS:  PPI   ADMISSION STATUS: Observation  CODE STATUS: Full Code Status History    Date Active Date Inactive Code Status Order ID Comments User Context   03/13/2017 1205 03/18/2017 1809 Full Code [id number]  Gracelyn Nurse, MD Inpatient   06/03/2016 1045 06/05/2016 1630 Full Code [id number]  Shaune Pollack, MD Inpatient      TOTAL TIME TAKING CARE OF THIS PATIENT: 40 minutes.   Dallys Nowakowski FIELDING 05/29/2018, 1:14 AM  [facility]  Office  [phone]  CC: Primary care physician; Barbette Reichmann, MD  Note:  This document was prepared using Dragon voice recognition software and may include unintentional dictation errors.

## 2018-05-29 NOTE — Clinical Social Work Note (Signed)
CSW received consult for assistance at home. CSW has updated the [facility]. CSW is signing off. Please consult should needs arise.  Argentina Ponder, MSW, Theresia Majors [phone]

## 2018-05-29 NOTE — Discharge Instructions (Signed)
Use your oxygen and nebulizer as before. °

## 2018-06-09 ENCOUNTER — Ambulatory Visit: Payer: Medicare Other | Admitting: Family Medicine

## 2018-06-16 ENCOUNTER — Emergency Department: Payer: Medicare Other

## 2018-06-16 ENCOUNTER — Other Ambulatory Visit: Payer: Self-pay

## 2018-06-16 ENCOUNTER — Inpatient Hospital Stay
Admission: EM | Admit: 2018-06-16 | Discharge: 2018-06-19 | DRG: 190 | Disposition: A | Discharge status: Home Health | Payer: Medicare Other | Attending: Family Medicine | Admitting: Family Medicine

## 2018-06-16 DIAGNOSIS — Z882 Allergy status to sulfonamides status: Secondary | ICD-10-CM | POA: Diagnosis not present

## 2018-06-16 DIAGNOSIS — I509 Heart failure, unspecified: Secondary | ICD-10-CM

## 2018-06-16 DIAGNOSIS — J209 Acute bronchitis, unspecified: Secondary | ICD-10-CM | POA: Diagnosis present

## 2018-06-16 DIAGNOSIS — J440 Chronic obstructive pulmonary disease with acute lower respiratory infection: Secondary | ICD-10-CM | POA: Diagnosis present

## 2018-06-16 DIAGNOSIS — E785 Hyperlipidemia, unspecified: Secondary | ICD-10-CM | POA: Diagnosis present

## 2018-06-16 DIAGNOSIS — Z7982 Long term (current) use of aspirin: Secondary | ICD-10-CM | POA: Diagnosis not present

## 2018-06-16 DIAGNOSIS — J9621 Acute and chronic respiratory failure with hypoxia: Secondary | ICD-10-CM | POA: Diagnosis present

## 2018-06-16 DIAGNOSIS — M5136 Other intervertebral disc degeneration, lumbar region: Secondary | ICD-10-CM | POA: Diagnosis present

## 2018-06-16 DIAGNOSIS — N183 Chronic kidney disease, stage 3 (moderate): Secondary | ICD-10-CM | POA: Diagnosis present

## 2018-06-16 DIAGNOSIS — I2510 Atherosclerotic heart disease of native coronary artery without angina pectoris: Secondary | ICD-10-CM | POA: Diagnosis present

## 2018-06-16 DIAGNOSIS — I482 Chronic atrial fibrillation, unspecified: Secondary | ICD-10-CM | POA: Diagnosis present

## 2018-06-16 DIAGNOSIS — Z885 Allergy status to narcotic agent status: Secondary | ICD-10-CM

## 2018-06-16 DIAGNOSIS — Z7951 Long term (current) use of inhaled steroids: Secondary | ICD-10-CM | POA: Diagnosis not present

## 2018-06-16 DIAGNOSIS — Z96659 Presence of unspecified artificial knee joint: Secondary | ICD-10-CM | POA: Diagnosis present

## 2018-06-16 DIAGNOSIS — Z809 Family history of malignant neoplasm, unspecified: Secondary | ICD-10-CM

## 2018-06-16 DIAGNOSIS — K219 Gastro-esophageal reflux disease without esophagitis: Secondary | ICD-10-CM | POA: Diagnosis present

## 2018-06-16 DIAGNOSIS — Z8249 Family history of ischemic heart disease and other diseases of the circulatory system: Secondary | ICD-10-CM

## 2018-06-16 DIAGNOSIS — I340 Nonrheumatic mitral (valve) insufficiency: Secondary | ICD-10-CM | POA: Diagnosis present

## 2018-06-16 DIAGNOSIS — I130 Hypertensive heart and chronic kidney disease with heart failure and stage 1 through stage 4 chronic kidney disease, or unspecified chronic kidney disease: Secondary | ICD-10-CM | POA: Diagnosis present

## 2018-06-16 DIAGNOSIS — J441 Chronic obstructive pulmonary disease with (acute) exacerbation: Secondary | ICD-10-CM | POA: Diagnosis present

## 2018-06-16 DIAGNOSIS — I5042 Chronic combined systolic (congestive) and diastolic (congestive) heart failure: Secondary | ICD-10-CM | POA: Diagnosis present

## 2018-06-16 DIAGNOSIS — Z79899 Other long term (current) drug therapy: Secondary | ICD-10-CM

## 2018-06-16 DIAGNOSIS — Z888 Allergy status to other drugs, medicaments and biological substances status: Secondary | ICD-10-CM | POA: Diagnosis not present

## 2018-06-16 DIAGNOSIS — R0600 Dyspnea, unspecified: Secondary | ICD-10-CM

## 2018-06-16 DIAGNOSIS — I428 Other cardiomyopathies: Secondary | ICD-10-CM | POA: Diagnosis present

## 2018-06-16 LAB — BASIC METABOLIC PANEL
ANION GAP: 7 (ref 5–15)
BUN: 15 mg/dL (ref 8–23)
CALCIUM: 8.4 mg/dL — AB (ref 8.9–10.3)
CO2: 32 mmol/L (ref 22–32)
Chloride: 102 mmol/L (ref 98–111)
Creatinine, Ser: 1.2 mg/dL — ABNORMAL HIGH (ref 0.44–1.00)
GFR calc Af Amer: 50 mL/min — ABNORMAL LOW (ref >60)
GFR calc non Af Amer: 43 mL/min — ABNORMAL LOW (ref >60)
Glucose, Bld: 114 mg/dL — ABNORMAL HIGH (ref 70–99)
POTASSIUM: 4.4 mmol/L (ref 3.5–5.1)
Sodium: 141 mmol/L (ref 135–145)

## 2018-06-16 LAB — CBC
HCT: 35.3 % — ABNORMAL LOW (ref 36.0–46.0)
HEMOGLOBIN: 11 g/dL — AB (ref 12.0–15.0)
MCH: 29.5 pg (ref 26.0–34.0)
MCHC: 31.2 g/dL (ref 30.0–36.0)
MCV: 94.6 fL (ref 80.0–100.0)
PLATELETS: 163 10*3/uL (ref 150–400)
RBC: 3.73 MIL/uL — AB (ref 3.87–5.11)
RDW: 14.3 % (ref 11.5–15.5)
WBC: 6.3 10*3/uL (ref 4.0–10.5)
nRBC: 0 % (ref 0.0–0.2)

## 2018-06-16 LAB — TROPONIN I: Troponin I: <0.03 ng/mL (ref <0.03)

## 2018-06-16 MED ORDER — SENNOSIDES-DOCUSATE SODIUM 8.6-50 MG PO TABS: 1.0000 | ORAL_TABLET | Freq: Every evening | ORAL | Status: DC | PRN

## 2018-06-16 MED ORDER — SPIRONOLACTONE 25 MG PO TABS
25.0000 mg | ORAL_TABLET | Freq: Every day | ORAL | Status: DC
  Administered 2018-06-17 – 2018-06-19 (×3): 25 mg via ORAL
  Filled 2018-06-16 (×3): qty 1

## 2018-06-16 MED ORDER — ACETAMINOPHEN 650 MG RE SUPP: 650.0000 mg | Freq: Four times a day (QID) | RECTAL | Status: DC | PRN

## 2018-06-16 MED ORDER — SODIUM CHLORIDE 0.9 % IV SOLN: 250.0000 mL | INTRAVENOUS | Status: DC | PRN

## 2018-06-16 MED ORDER — ASPIRIN 81 MG PO CHEW
81.0000 mg | CHEWABLE_TABLET | Freq: Every day | ORAL | Status: DC
  Administered 2018-06-17 – 2018-06-19 (×3): 81 mg via ORAL
  Filled 2018-06-16 (×3): qty 1

## 2018-06-16 MED ORDER — SERTRALINE HCL 50 MG PO TABS
50.0000 mg | ORAL_TABLET | Freq: Every day | ORAL | Status: DC
  Administered 2018-06-17 – 2018-06-19 (×3): 50 mg via ORAL
  Filled 2018-06-16 (×3): qty 1

## 2018-06-16 MED ORDER — FUROSEMIDE 10 MG/ML IJ SOLN
60.0000 mg | Freq: Once | INTRAMUSCULAR | Status: AC
  Administered 2018-06-16: 60 mg via INTRAVENOUS
  Filled 2018-06-16: qty 8

## 2018-06-16 MED ORDER — SODIUM CHLORIDE 0.9% FLUSH: 3.0000 mL | INTRAVENOUS | Status: DC | PRN

## 2018-06-16 MED ORDER — ATORVASTATIN CALCIUM 20 MG PO TABS
40.0000 mg | ORAL_TABLET | Freq: Every day | ORAL | Status: DC
  Administered 2018-06-17 – 2018-06-19 (×3): 40 mg via ORAL
  Filled 2018-06-16 (×3): qty 2

## 2018-06-16 MED ORDER — VITAMIN D 25 MCG (1000 UNIT) PO TABS
1000.0000 [IU] | ORAL_TABLET | Freq: Every day | ORAL | Status: DC
  Administered 2018-06-17 – 2018-06-19 (×3): 1000 [IU] via ORAL
  Filled 2018-06-16 (×3): qty 1

## 2018-06-16 MED ORDER — ACETAMINOPHEN 325 MG PO TABS: 650.0000 mg | ORAL_TABLET | Freq: Four times a day (QID) | ORAL | Status: DC | PRN

## 2018-06-16 MED ORDER — TRAMADOL HCL 50 MG PO TABS
50.0000 mg | ORAL_TABLET | Freq: Two times a day (BID) | ORAL | Status: DC
  Administered 2018-06-16 – 2018-06-19 (×6): 50 mg via ORAL
  Filled 2018-06-16 (×6): qty 1

## 2018-06-16 MED ORDER — PANTOPRAZOLE SODIUM 40 MG PO TBEC
40.0000 mg | DELAYED_RELEASE_TABLET | Freq: Every day | ORAL | Status: DC
  Administered 2018-06-17 – 2018-06-19 (×3): 40 mg via ORAL
  Filled 2018-06-16 (×3): qty 1

## 2018-06-16 MED ORDER — METHYLPREDNISOLONE SODIUM SUCC 125 MG IJ SOLR
125.0000 mg | Freq: Once | INTRAMUSCULAR | Status: AC
  Administered 2018-06-16: 125 mg via INTRAVENOUS
  Filled 2018-06-16: qty 2

## 2018-06-16 MED ORDER — TRAZODONE HCL 50 MG PO TABS
50.0000 mg | ORAL_TABLET | Freq: Every evening | ORAL | Status: DC | PRN
  Filled 2018-06-16: qty 1

## 2018-06-16 MED ORDER — VITAMIN B-12 1000 MCG PO TABS
1000.0000 ug | ORAL_TABLET | Freq: Every day | ORAL | Status: DC
  Administered 2018-06-17 – 2018-06-19 (×3): 1000 ug via ORAL
  Filled 2018-06-16 (×3): qty 1

## 2018-06-16 MED ORDER — IPRATROPIUM-ALBUTEROL 0.5-2.5 (3) MG/3ML IN SOLN
3.0000 mL | Freq: Once | RESPIRATORY_TRACT | Status: AC
  Administered 2018-06-16: 3 mL via RESPIRATORY_TRACT
  Filled 2018-06-16: qty 3

## 2018-06-16 MED ORDER — LOSARTAN POTASSIUM 50 MG PO TABS
25.0000 mg | ORAL_TABLET | Freq: Every day | ORAL | Status: DC
  Administered 2018-06-17 – 2018-06-19 (×3): 25 mg via ORAL
  Filled 2018-06-16 (×3): qty 1

## 2018-06-16 MED ORDER — IPRATROPIUM-ALBUTEROL 0.5-2.5 (3) MG/3ML IN SOLN
3.0000 mL | Freq: Four times a day (QID) | RESPIRATORY_TRACT | Status: DC
  Administered 2018-06-17 – 2018-06-19 (×8): 3 mL via RESPIRATORY_TRACT
  Filled 2018-06-16 (×10): qty 3

## 2018-06-16 MED ORDER — ONDANSETRON HCL 4 MG/2ML IJ SOLN: 4.0000 mg | Freq: Four times a day (QID) | INTRAMUSCULAR | Status: DC | PRN

## 2018-06-16 MED ORDER — ONDANSETRON HCL 4 MG PO TABS: 4.0000 mg | ORAL_TABLET | Freq: Four times a day (QID) | ORAL | Status: DC | PRN

## 2018-06-16 MED ORDER — MIRTAZAPINE 15 MG PO TABS
15.0000 mg | ORAL_TABLET | Freq: Every day | ORAL | Status: DC
  Administered 2018-06-16 – 2018-06-18 (×3): 15 mg via ORAL
  Filled 2018-06-16 (×3): qty 1

## 2018-06-16 MED ORDER — METHYLPREDNISOLONE SODIUM SUCC 125 MG IJ SOLR
60.0000 mg | Freq: Four times a day (QID) | INTRAMUSCULAR | Status: DC
  Administered 2018-06-17 – 2018-06-19 (×9): 60 mg via INTRAVENOUS
  Filled 2018-06-16 (×10): qty 2

## 2018-06-16 MED ORDER — CARVEDILOL 25 MG PO TABS
25.0000 mg | ORAL_TABLET | Freq: Two times a day (BID) | ORAL | Status: DC
  Administered 2018-06-17 – 2018-06-19 (×5): 25 mg via ORAL
  Filled 2018-06-16 (×5): qty 1

## 2018-06-16 MED ORDER — TORSEMIDE 20 MG PO TABS
20.0000 mg | ORAL_TABLET | Freq: Every day | ORAL | Status: DC
  Administered 2018-06-17 – 2018-06-19 (×3): 20 mg via ORAL
  Filled 2018-06-16 (×3): qty 1

## 2018-06-16 MED ORDER — SODIUM CHLORIDE 0.9% FLUSH
3.0000 mL | Freq: Two times a day (BID) | INTRAVENOUS | Status: DC
  Administered 2018-06-16 – 2018-06-19 (×6): 3 mL via INTRAVENOUS

## 2018-06-16 MED ORDER — GABAPENTIN 600 MG PO TABS
1200.0000 mg | ORAL_TABLET | Freq: Two times a day (BID) | ORAL | Status: DC
  Administered 2018-06-16 – 2018-06-19 (×6): 1200 mg via ORAL
  Filled 2018-06-16 (×6): qty 2

## 2018-06-16 MED ORDER — ENOXAPARIN SODIUM 40 MG/0.4ML ~~LOC~~ SOLN
40.0000 mg | Freq: Two times a day (BID) | SUBCUTANEOUS | Status: DC
  Administered 2018-06-16 – 2018-06-19 (×6): 40 mg via SUBCUTANEOUS
  Filled 2018-06-16 (×6): qty 0.4

## 2018-06-16 NOTE — Progress Notes (Signed)
Anticoagulation monitoring(Lovenox):  72 yo female ordered Lovenox 40 mg Q24h  Filed Weights   06/16/18 1956  Weight: 232 lb (105.2 kg)   BMI 42.42   Lab Results  Component Value Date   CREATININE 1.20 (H) 06/16/2018   CREATININE 1.25 (H) 05/29/2018   CREATININE 1.27 (H) 05/28/2018   Estimated Creatinine Clearance: 46.8 mL/min (A) (by C-G formula based on SCr of 1.2 mg/dL (H)). Hemoglobin & Hematocrit     Component Value Date/Time   HGB 11.0 (L) 06/16/2018 2001   HGB 11.8 09/01/2017 1539   HCT 35.3 (L) 06/16/2018 2001   HCT 36.4 09/01/2017 1539     Per Protocol for Patient with estCrcl > 30 ml/min and BMI > 40, will transition to Lovenox 40 mg Q12h.

## 2018-06-16 NOTE — Progress Notes (Signed)
Advanced care plan.  Purpose of the Encounter: CODE STATUS  Parties in Attendance: Patient and family  Patient's Decision Capacity: Good  Subjective/Patient's story: Presented to emergency room for shortness of breath and wheezing   Objective/Medical story Patient has COPD exacerbation Needs nebulization treatments and IV steroids   Goals of care determination:  Advance care directives and goals of care discussed Patient wants everything done which includes CPR, intubation ventilator the need arises   CODE STATUS: Full code   Time spent discussing advanced care planning: 16 minutes

## 2018-06-16 NOTE — ED Provider Notes (Signed)
[facility] Emergency Department Provider Note  Time seen: 9:07 PM  I have reviewed the triage vital signs and the nursing notes.   HISTORY  Chief Complaint Shortness of Breath    HPI [name] [name] is a 72 y.o. female with a past medical history of asthma, COPD, CHF on 4 L of oxygen 24/7 presents to the emergency department for difficulty breathing.  According to the patient since this morning she has been experiencing significant difficulty breathing.  EMS states upon arrival patient had a 4 L oxygen saturation of 70%.  Patient states she has been expensing chest pain/tightness moderate in severity over the day today as well.  Denies any increased lower extremity edema.  Denies abdominal pain.  States frequent cough much more than normal.  Denies fever.  States occasional sputum production.   Past Medical History:  Diagnosis Date  . Asthma   . Chronic combined systolic (congestive) and diastolic (congestive) heart failure (HCC)    a. 02/2017 Echo: EF 25-30%, Gr2 DD.  Marland Kitchen COPD (chronic obstructive pulmonary disease) (HCC)    a. On supplemental O2 @ home.  Marland Kitchen Hypertension   . Lung nodule   . Narrow complex tachycardia (HCC)    a. 02/2017 - ? NSVT vs Afib-->placed on amio.  No OAC.  Marland Kitchen NICM (nonischemic cardiomyopathy) (HCC)    a. 02/2017 Echo: Ef 25-30%, mild conc LVH, Gr2 DD, sev MR, mod dil LA;  b. 02/2016 Cath [facility]): nonobs dzs.  . Non-obstructive CAD    a. 02/2016 Cath [facility]): LM 30, LAD 20ost/p, 4m, 30d, D1 50p, D2 30, D3 20, RI 20, LCX 40p, OM1 30, OM2 20, RCA 20 diffuse, RPL2 30, RPL3 30.  Marland Kitchen Severe mitral regurgitation    a. 02/2017 Echo: Sev MR.    Patient Active Problem List   Diagnosis Date Noted  . COPD with acute exacerbation (HCC) 05/29/2018  . Severe mitral regurgitation 11/10/2017  . OA (osteoarthritis) of knee 11/10/2017  . CKD (chronic kidney disease) stage 3, GFR 30-59 ml/min (HCC) 08/20/2017  . Insomnia 07/22/2017  . Hyperlipidemia  07/01/2017  . DDD (degenerative disc disease), lumbar 04/30/2017  . B12 deficiency 04/30/2017  . Gait instability 03/31/2017  . Allergic rhinitis 03/31/2017  . Nausea 03/31/2017  . GERD (gastroesophageal reflux disease) 03/31/2017  . Chronic systolic heart failure (HCC) 03/24/2017  . HTN (hypertension) 03/24/2017  . COPD (chronic obstructive pulmonary disease) (HCC) 03/24/2017  . Chronic pain 03/24/2017  . MRSA carrier 03/18/2017  . History of ventricular tachycardia 03/18/2017  . Vitamin D deficiency 01/06/2017  . Chronic atrial fibrillation 06/15/2016  . Depression 06/15/2016  . Varicose veins of lower extremity with inflammation 09/07/2011    Past Surgical History:  Procedure Laterality Date  . REPLACEMENT TOTAL KNEE    . TYMPANOSTOMY TUBE PLACEMENT Bilateral 03/19/2017    Prior to Admission medications   Medication Sig Start Date End Date Taking? Authorizing Provider  acetaminophen (TYLENOL) 500 MG tablet Take 2 tablets (1,000 mg total) by mouth 3 (three) times daily. 08/02/17   Plonk, Chrissie Noa, MD  albuterol (PROVENTIL HFA;VENTOLIN HFA) 108 (90 Base) MCG/ACT inhaler Inhale 2 puffs into the lungs every 6 (six) hours as needed for wheezing or shortness of breath. 12/07/17   Duanne Limerick, MD  albuterol (PROVENTIL) (2.5 MG/3ML) 0.083% nebulizer solution Take 3 mLs (2.5 mg total) by nebulization every 6 (six) hours as needed for wheezing or shortness of breath. 09/01/17   Schuyler Amor, MD  aspirin 81 MG chewable  tablet Chew 81 mg by mouth daily.    [provider]  atorvastatin (LIPITOR) 40 MG tablet Take 1 tablet (40 mg total) by mouth daily. 12/07/17   Duanne Limerick, MD  budesonide-formoterol (SYMBICORT) 160-4.5 MCG/ACT inhaler Inhale 2 puffs into the lungs 2 (two) times daily. 12/07/17   Duanne Limerick, MD  carvedilol (COREG) 25 MG tablet Take 1 tablet (25 mg total) by mouth 2 (two) times daily. 12/07/17   Duanne Limerick, MD  Cholecalciferol (VITAMIN D3) 1000 units CAPS  Take 1 capsule by mouth daily.  07/24/17 07/24/18  [provider]  Cyanocobalamin (B-12) 1000 MCG TABS Take 1 tablet by mouth daily. 04/05/17   Plonk, Chrissie Noa, MD  fluticasone (FLONASE) 50 MCG/ACT nasal spray Place 2 sprays into both nostrils daily. 12/07/17   Duanne Limerick, MD  gabapentin (NEURONTIN) 600 MG tablet Take 1,200 mg by mouth 2 (two) times daily. 04/28/18   [provider]  ketoconazole (NIZORAL) 2 % shampoo Apply 1 application topically 3 (three) times a week.  08/30/17   [provider]  losartan (COZAAR) 25 MG tablet Take 1 tablet (25 mg total) by mouth daily. 12/07/17   Duanne Limerick, MD  mirtazapine (REMERON) 15 MG tablet Take 15 mg by mouth at bedtime.  03/24/18   [provider]  omeprazole (PRILOSEC) 40 MG capsule Take 1 capsule (40 mg total) by mouth daily. 12/07/17 12/07/18  Duanne Limerick, MD  OXYGEN Inhale 4 L into the lungs.     [provider]  predniSONE (DELTASONE) 10 MG tablet Take 5 tablets (50 mg total) by mouth daily with breakfast. Take 50 mg daily taper by 10 mg daily then stop 05/29/18   Enedina Finner, MD  promethazine (PHENERGAN) 25 MG tablet Take 0.5 tablets (12.5 mg total) by mouth every 6 (six) hours as needed for nausea. 08/16/17   Plonk, Chrissie Noa, MD  sertraline (ZOLOFT) 50 MG tablet Take 50 mg by mouth daily. 04/22/18   [provider]  spironolactone (ALDACTONE) 25 MG tablet Take 1 tablet (25 mg total) by mouth daily. 12/07/17   Duanne Limerick, MD  torsemide (DEMADEX) 20 MG tablet Take 20 mg by mouth daily. 04/22/18   [provider]  traMADol (ULTRAM) 50 MG tablet Take 50 mg by mouth 2 (two) times daily.  04/13/18   [provider]  traZODone (DESYREL) 50 MG tablet Take 1 tablet (50 mg total) by mouth at bedtime as needed for sleep. 12/13/17   Duanne Limerick, MD    Allergies  Allergen Reactions  . Sulfa Antibiotics Itching    Pt states she gets "itching inside and out"  . Cymbalta  [Duloxetine Hcl] Nausea Only  . Tramadol Hcl Other (See Comments)    Falls, confusion    Family History  Problem Relation Age of Onset  . Heart disease Mother   . Heart disease Sister   . Cancer Sister     Social History Social History   Tobacco Use  . Smoking status: Former Smoker    Packs/day: 3.00    Years: 55.00    Pack years: 165.00    Types: Cigarettes    Last attempt to quit: 2018    Years since quitting: 1.8  . Smokeless tobacco: Never Used  . Tobacco comment: smoking cessation materials not required  Substance Use Topics  . Alcohol use: No  . Drug use: No    Review of Systems Constitutional: Negative for fever. Cardiovascular: Moderate discomfort/tightness  today Respiratory: Significant shortness of breath today.  Positive for cough with occasional sputum production Gastrointestinal: Negative for abdominal pain, vomiting Genitourinary: Negative for urinary compaints Musculoskeletal: Negative for leg pain or swelling Skin: Negative for skin complaints  Neurological: Negative for headache All other ROS negative  ____________________________________________   PHYSICAL EXAM:  VITAL SIGNS: ED Triage Vitals  Enc Vitals Group     BP 06/16/18 1954 123/75     Pulse Rate 06/16/18 1954 77     Resp 06/16/18 1954 20     Temp 06/16/18 1954 98.5 F (36.9 C)     Temp Source 06/16/18 1954 Oral     SpO2 06/16/18 1954 100 %     Weight 06/16/18 1956 232 lb (105.2 kg)     Height 06/16/18 1956 5\' 2"  (1.575 m)     Head Circumference --      Peak Flow --      Pain Score 06/16/18 1956 0     Pain Loc --      Pain Edu? --      Excl. in GC? --    Constitutional: Alert and oriented.  Mild distress due to increased work of breathing. Eyes: Normal exam ENT   Head: Normocephalic and atraumatic   Mouth/Throat: Mucous membranes are moist. Cardiovascular: Normal rate, regular rhythm. No murmur Respiratory: Patient is tachypneic around 20 to 25 breaths/min with  diffuse expiratory wheeze Gastrointestinal: Soft and nontender. No distention.   Musculoskeletal: Nontender with normal range of motion in all extremities. No lower extremity tenderness or edema. Neurologic:  Normal speech and language. No gross focal neurologic deficits  Skin:  Skin is warm, dry and intact.  Psychiatric: Mood and affect are normal.   ____________________________________________    EKG  EKG reviewed and interpreted by myself shows a normal sinus rhythm at 78 bpm with a narrow QRS, normal axis, largely normal intervals nonspecific ST changes  ____________________________________________    RADIOLOGY  Chest x-ray consistent with interstitial edema  ____________________________________________   INITIAL IMPRESSION / ASSESSMENT AND PLAN / ED COURSE  Pertinent labs & imaging results that were available during my care of the patient were reviewed by me and considered in my medical decision making (see chart for details).  Patient presents to the emergency department for increased work of breathing, wheeze on examination.  Patient noted to be 70% by EMS on 4 L.  Currently is on 6 L of oxygen satting in the mid upper 90s however with minimal exertion in the bed the patient desats to 85% with a good waveform.  Patient has diffuse expiratory wheeze on examination we will dose DuoNeb's and Solu-Medrol.  Chest x-ray is read as interstitial edema we will dose IV Lasix.  Patient's troponin is negative.  Given the patient's interstitial edema with hypoxia with minimal exertion as well as diffuse wheezing patient will be admitted to the hospital service for continued treatment.  Patient agreeable to plan of care.  ____________________________________________   FINAL CLINICAL IMPRESSION(S) / ED DIAGNOSES  Dyspnea Hypoxia COPD exacerbation CHF    Minna Antis, MD 06/16/18 2110

## 2018-06-16 NOTE — ED Triage Notes (Addendum)
Pt arrives to ED via [facility] from home with c/o [facility] x2 days. Pt with h/x of COPD and CHF, reports taking OTC Mucinex without relief. EMS reports pt's O2 sats originally around 70% on 4L at home (states pt's O2 tubing was extremely long), improved to 100% when placed on 6L by EMS. EMS states pt self-administered 2 Albuterol t/x's prior to EMS arrival. No c/o N/V/D, no fever. Pt is A&O, in NAD, RR even, regular and unlabored on 4L O2 with sats at 100%.

## 2018-06-16 NOTE — ED Notes (Addendum)
Pt reporting ohead pain at this time and requesting pain medication. Admitting MD at bedside and aware of request.

## 2018-06-16 NOTE — H&P (Signed)
[facility] - [place] at [facility]   PATIENT NAME: [name] [name]    MR#:  [id number]  DATE OF BIRTH:  1946/06/09  DATE OF ADMISSION:  06/16/2018  PRIMARY CARE PHYSICIAN: Barbette Reichmann, MD   REQUESTING/REFERRING PHYSICIAN:   CHIEF COMPLAINT:   Chief Complaint  Patient presents with  . Shortness of Breath    HISTORY OF PRESENT ILLNESS: Genowefa Morga  is a 72 y.o. female with a known history of COPD, chronic systolic and diastolic heart failure, hypertension, nonischemic cardiomyopathy presented to the emergency room with increased shortness of breath and wheezing for the last few days.  Patient has occasional dry cough.  Patient was evaluated in the emergency room was found to be COPD exacerbation.  Patient received IV steroids and nebulization treatments.  Hospitalist service was consulted.  Patient is on home oxygen.  PAST MEDICAL HISTORY:   Past Medical History:  Diagnosis Date  . Asthma   . Chronic combined systolic (congestive) and diastolic (congestive) heart failure (HCC)    a. 02/2017 Echo: EF 25-30%, Gr2 DD.  Marland Kitchen COPD (chronic obstructive pulmonary disease) (HCC)    a. On supplemental O2 @ home.  Marland Kitchen Hypertension   . Lung nodule   . Narrow complex tachycardia (HCC)    a. 02/2017 - ? NSVT vs Afib-->placed on amio.  No OAC.  Marland Kitchen NICM (nonischemic cardiomyopathy) (HCC)    a. 02/2017 Echo: Ef 25-30%, mild conc LVH, Gr2 DD, sev MR, mod dil LA;  b. 02/2016 Cath [facility]): nonobs dzs.  . Non-obstructive CAD    a. 02/2016 Cath [facility]): LM 30, LAD 20ost/p, 106m, 30d, D1 50p, D2 30, D3 20, RI 20, LCX 40p, OM1 30, OM2 20, RCA 20 diffuse, RPL2 30, RPL3 30.  Marland Kitchen Severe mitral regurgitation    a. 02/2017 Echo: Sev MR.    PAST SURGICAL HISTORY:  Past Surgical History:  Procedure Laterality Date  . REPLACEMENT TOTAL KNEE    . TYMPANOSTOMY TUBE PLACEMENT Bilateral 03/19/2017    SOCIAL HISTORY:  Social History   Tobacco Use  . Smoking status: Former Smoker    Packs/day:  3.00    Years: 55.00    Pack years: 165.00    Types: Cigarettes    Last attempt to quit: 2018    Years since quitting: 1.8  . Smokeless tobacco: Never Used  . Tobacco comment: smoking cessation materials not required  Substance Use Topics  . Alcohol use: No    FAMILY HISTORY:  Family History  Problem Relation Age of Onset  . Heart disease Mother   . Heart disease Sister   . Cancer Sister     DRUG ALLERGIES:  Allergies  Allergen Reactions  . Sulfa Antibiotics Itching    Pt states she gets "itching inside and out"  . Cymbalta [Duloxetine Hcl] Nausea Only  . Tramadol Hcl Other (See Comments)    Falls, confusion    REVIEW OF SYSTEMS:   CONSTITUTIONAL: No fever, fatigue or weakness.  EYES: No blurred or double vision.  EARS, NOSE, AND THROAT: No tinnitus or ear pain.  RESPIRATORY: Has cough, shortness of breath, wheezing  No hemoptysis.  CARDIOVASCULAR: No chest pain, orthopnea, edema.  GASTROINTESTINAL: No nausea, vomiting, diarrhea or abdominal pain.  GENITOURINARY: No dysuria, hematuria.  ENDOCRINE: No polyuria, nocturia,  HEMATOLOGY: No anemia, easy bruising or bleeding SKIN: No rash or lesion. MUSCULOSKELETAL: No joint pain or arthritis.   NEUROLOGIC: No tingling, numbness, weakness.  PSYCHIATRY: No anxiety or depression.   MEDICATIONS  AT HOME:  Prior to Admission medications   Medication Sig Start Date End Date Taking? Authorizing Provider  acetaminophen (TYLENOL) 500 MG tablet Take 2 tablets (1,000 mg total) by mouth 3 (three) times daily. 08/02/17   Plonk, Chrissie Noa, MD  albuterol (PROVENTIL HFA;VENTOLIN HFA) 108 (90 Base) MCG/ACT inhaler Inhale 2 puffs into the lungs every 6 (six) hours as needed for wheezing or shortness of breath. 12/07/17   Duanne Limerick, MD  albuterol (PROVENTIL) (2.5 MG/3ML) 0.083% nebulizer solution Take 3 mLs (2.5 mg total) by nebulization every 6 (six) hours as needed for wheezing or shortness of breath. 09/01/17   Plonk, Chrissie Noa, MD   aspirin 81 MG chewable tablet Chew 81 mg by mouth daily.    [provider]  atorvastatin (LIPITOR) 40 MG tablet Take 1 tablet (40 mg total) by mouth daily. 12/07/17   Duanne Limerick, MD  budesonide-formoterol (SYMBICORT) 160-4.5 MCG/ACT inhaler Inhale 2 puffs into the lungs 2 (two) times daily. 12/07/17   Duanne Limerick, MD  carvedilol (COREG) 25 MG tablet Take 1 tablet (25 mg total) by mouth 2 (two) times daily. 12/07/17   Duanne Limerick, MD  Cholecalciferol (VITAMIN D3) 1000 units CAPS Take 1 capsule by mouth daily.  07/24/17 07/24/18  [provider]  Cyanocobalamin (B-12) 1000 MCG TABS Take 1 tablet by mouth daily. 04/05/17   Plonk, Chrissie Noa, MD  fluticasone (FLONASE) 50 MCG/ACT nasal spray Place 2 sprays into both nostrils daily. 12/07/17   Duanne Limerick, MD  gabapentin (NEURONTIN) 600 MG tablet Take 1,200 mg by mouth 2 (two) times daily. 04/28/18   [provider]  ketoconazole (NIZORAL) 2 % shampoo Apply 1 application topically 3 (three) times a week.  08/30/17   [provider]  losartan (COZAAR) 25 MG tablet Take 1 tablet (25 mg total) by mouth daily. 12/07/17   Duanne Limerick, MD  mirtazapine (REMERON) 15 MG tablet Take 15 mg by mouth at bedtime.  03/24/18   [provider]  omeprazole (PRILOSEC) 40 MG capsule Take 1 capsule (40 mg total) by mouth daily. 12/07/17 12/07/18  Duanne Limerick, MD  OXYGEN Inhale 4 L into the lungs.     [provider]  predniSONE (DELTASONE) 10 MG tablet Take 5 tablets (50 mg total) by mouth daily with breakfast. Take 50 mg daily taper by 10 mg daily then stop 05/29/18   Enedina Finner, MD  promethazine (PHENERGAN) 25 MG tablet Take 0.5 tablets (12.5 mg total) by mouth every 6 (six) hours as needed for nausea. 08/16/17   Plonk, Chrissie Noa, MD  sertraline (ZOLOFT) 50 MG tablet Take 50 mg by mouth daily. 04/22/18   [provider]  spironolactone (ALDACTONE) 25 MG tablet Take 1 tablet (25 mg total) by mouth daily.  12/07/17   Duanne Limerick, MD  torsemide (DEMADEX) 20 MG tablet Take 20 mg by mouth daily. 04/22/18   [provider]  traMADol (ULTRAM) 50 MG tablet Take 50 mg by mouth 2 (two) times daily.  04/13/18   [provider]  traZODone (DESYREL) 50 MG tablet Take 1 tablet (50 mg total) by mouth at bedtime as needed for sleep. 12/13/17   Duanne Limerick, MD      PHYSICAL EXAMINATION:   VITAL SIGNS: Blood pressure 123/75, pulse 77, temperature 98.5 F (36.9 C), temperature source Oral, resp. rate 20, height 5\' 2"  (1.575 m), weight 105.2 kg, SpO2 100 %.  GENERAL:  72 y.o.-year-old patient lying in the bed with  no acute distress.  EYES: Pupils equal, round, reactive to light and accommodation. No scleral icterus. Extraocular muscles intact.  HEENT: Head atraumatic, normocephalic. Oropharynx and nasopharynx clear.  NECK:  Supple, no jugular venous distention. No thyroid enlargement, no tenderness.  LUNGS: Normal breath sounds bilaterally, no wheezing, rales,rhonchi or crepitation. No use of accessory muscles of respiration.  CARDIOVASCULAR: S1, S2 normal. No murmurs, rubs, or gallops.  ABDOMEN: Soft, nontender, nondistended. Bowel sounds present. No organomegaly or mass.  EXTREMITIES: No pedal edema, cyanosis, or clubbing.  NEUROLOGIC: Cranial nerves II through XII are intact. Muscle strength 5/5 in all extremities. Sensation intact. Gait not checked.  PSYCHIATRIC: The patient is alert and oriented x 3.  SKIN: No obvious rash, lesion, or ulcer.   LABORATORY PANEL:   CBC Recent Labs  Lab 06/16/18 2001  WBC 6.3  HGB 11.0*  HCT 35.3*  PLT 163  MCV 94.6  MCH 29.5  MCHC 31.2  RDW 14.3   ------------------------------------------------------------------------------------------------------------------  Chemistries  Recent Labs  Lab 06/16/18 2001  NA 141  K 4.4  CL 102  CO2 32  GLUCOSE 114*  BUN 15  CREATININE 1.20*  CALCIUM 8.4*    ------------------------------------------------------------------------------------------------------------------ estimated creatinine clearance is 46.8 mL/min (A) (by C-G formula based on SCr of 1.2 mg/dL (H)). ------------------------------------------------------------------------------------------------------------------ No results for input(s): TSH, T4TOTAL, T3FREE, THYROIDAB in the last 72 hours.  Invalid input(s): FREET3   Coagulation profile No results for input(s): INR, PROTIME in the last 168 hours. ------------------------------------------------------------------------------------------------------------------- No results for input(s): DDIMER in the last 72 hours. -------------------------------------------------------------------------------------------------------------------  Cardiac Enzymes Recent Labs  Lab 06/16/18 2001  TROPONINI <0.03   ------------------------------------------------------------------------------------------------------------------ Invalid input(s): POCBNP  ---------------------------------------------------------------------------------------------------------------  Urinalysis    Component Value Date/Time   COLORURINE YELLOW (A) 03/13/2017 0947   APPEARANCEUR HAZY (A) 03/13/2017 0947   APPEARANCEUR Clear 04/21/2014 1836   LABSPEC 1.005 03/13/2017 0947   LABSPEC 1.009 04/21/2014 1836   PHURINE 6.0 03/13/2017 0947   GLUCOSEU NEGATIVE 03/13/2017 0947   GLUCOSEU Negative 04/21/2014 1836   HGBUR SMALL (A) 03/13/2017 0947   BILIRUBINUR neg 08/04/2017 0942   BILIRUBINUR Negative 04/21/2014 1836   KETONESUR NEGATIVE 03/13/2017 0947   PROTEINUR neg 08/04/2017 0942   PROTEINUR NEGATIVE 03/13/2017 0947   UROBILINOGEN 0.2 08/04/2017 0942   NITRITE neg 08/04/2017 0942   NITRITE NEGATIVE 03/13/2017 0947   LEUKOCYTESUR Negative 08/04/2017 0942   LEUKOCYTESUR Negative 04/21/2014 1836     RADIOLOGY: Dg Chest 2 View  Result Date:  06/16/2018 CLINICAL DATA:  Dyspnea x2 days. EXAM: CHEST - 2 VIEW COMPARISON:  CT 05/28/2018 and CXR 05/28/2018 FINDINGS: Stable cardiomegaly with interstitial edema. Tortuous atherosclerotic aorta is identified. There is bibasilar atelectasis. No effusion. Mild degenerative change along the dorsal spine. And screw fixation of the right midclavicle. Osteoarthritis of the [facility] and glenohumeral joints bilaterally. IMPRESSION: Stable cardiomegaly with interstitial edema. Bibasilar atelectasis. Electronically Signed   By: Tollie Eth M.D.   On: 06/16/2018 20:35    EKG: Orders placed or performed during the hospital encounter of 06/16/18  . ED EKG  . ED EKG  . EKG 12-Lead  . EKG 12-Lead    IMPRESSION AND PLAN: 72 year old female patient with history of chronic systolic and diastolic heart failure, COPD on home oxygen, hypertension, nonischemic cardiomyopathy presented to the emergency room with increased shortness of breath and wheezing  -Acute COPD exacerbation Admit patient to medical floor IV Solu-Medrol 60 mg every 6 hourly Aggressive nebulization treatments Oxygen via nasal cannula  -Combined chronic systolic and diastolic  heart failure Stable Continue medical management and diuresis  -DVT prophylaxis with subcu Lovenox daily  -History of nonischemic cardiomyopathy Continue aspirin, statin, losartan and beta-blocker  All the records are reviewed and case discussed with ED provider. Management plans discussed with the patient, family and they are in agreement.  CODE STATUS: Full code Code Status History    Date Active Date Inactive Code Status Order ID Comments User Context   05/29/2018 0208 05/29/2018 1534 Full Code [id number]  Oralia Manis, MD ED   03/13/2017 1205 03/18/2017 1809 Full Code [id number]  Gracelyn Nurse, MD Inpatient   06/03/2016 1045 06/05/2016 1630 Full Code [id number]  Shaune Pollack, MD Inpatient       TOTAL TIME TAKING CARE OF THIS PATIENT: 53 minutes.    Ihor Austin M.D on 06/16/2018 at 9:46 PM  Between 7am to 6pm - Pager - [phone]  After 6pm go to [email or web address] - password [email or web address] [facility]  [facility]  Office  [phone]  CC: Primary care physician; Barbette Reichmann, MD

## 2018-06-17 ENCOUNTER — Other Ambulatory Visit: Payer: Self-pay

## 2018-06-17 LAB — BASIC METABOLIC PANEL
ANION GAP: 8 (ref 5–15)
BUN: 16 mg/dL (ref 8–23)
CHLORIDE: 103 mmol/L (ref 98–111)
CO2: 31 mmol/L (ref 22–32)
Calcium: 8.3 mg/dL — ABNORMAL LOW (ref 8.9–10.3)
Creatinine, Ser: 1.08 mg/dL — ABNORMAL HIGH (ref 0.44–1.00)
GFR calc Af Amer: 57 mL/min — ABNORMAL LOW (ref >60)
GFR calc non Af Amer: 49 mL/min — ABNORMAL LOW (ref >60)
Glucose, Bld: 195 mg/dL — ABNORMAL HIGH (ref 70–99)
Potassium: 3.7 mmol/L (ref 3.5–5.1)
SODIUM: 142 mmol/L (ref 135–145)

## 2018-06-17 LAB — CBC
HEMATOCRIT: 33.8 % — AB (ref 36.0–46.0)
HEMOGLOBIN: 10.6 g/dL — AB (ref 12.0–15.0)
MCH: 29.2 pg (ref 26.0–34.0)
MCHC: 31.4 g/dL (ref 30.0–36.0)
MCV: 93.1 fL (ref 80.0–100.0)
NRBC: 0 % (ref 0.0–0.2)
Platelets: 162 10*3/uL (ref 150–400)
RBC: 3.63 MIL/uL — AB (ref 3.87–5.11)
RDW: 13.8 % (ref 11.5–15.5)
WBC: 5.1 10*3/uL (ref 4.0–10.5)

## 2018-06-17 LAB — TROPONIN I
Troponin I: <0.03 ng/mL (ref <0.03)
Troponin I: <0.03 ng/mL (ref <0.03)

## 2018-06-17 MED ORDER — GUAIFENESIN ER 600 MG PO TB12
600.0000 mg | ORAL_TABLET | Freq: Two times a day (BID) | ORAL | Status: DC
  Administered 2018-06-17 – 2018-06-19 (×5): 600 mg via ORAL
  Filled 2018-06-17 (×5): qty 1

## 2018-06-17 MED ORDER — GUAIFENESIN-CODEINE 100-10 MG/5ML PO SOLN: 10.0000 mL | Freq: Four times a day (QID) | ORAL | Status: DC | PRN

## 2018-06-17 MED ORDER — BUDESONIDE 0.25 MG/2ML IN SUSP
0.2500 mg | Freq: Two times a day (BID) | RESPIRATORY_TRACT | Status: DC
  Administered 2018-06-17 – 2018-06-19 (×4): 0.25 mg via RESPIRATORY_TRACT
  Filled 2018-06-17 (×4): qty 2

## 2018-06-17 MED ORDER — PHENOL 1.4 % MT LIQD
1.0000 | OROMUCOSAL | Status: DC | PRN
  Administered 2018-06-17: 12:00:00 1 via OROMUCOSAL
  Filled 2018-06-17: qty 177

## 2018-06-17 MED ORDER — ORAL CARE MOUTH RINSE
15.0000 mL | Freq: Two times a day (BID) | OROMUCOSAL | Status: DC
  Administered 2018-06-17 – 2018-06-19 (×4): 15 mL via OROMUCOSAL

## 2018-06-17 NOTE — Progress Notes (Signed)
Pt refused 0200 hr tx 

## 2018-06-17 NOTE — Progress Notes (Signed)
[employer] - [place] at [facility]                                                                                                                                                                                  Patient Demographics   [name] [name], is a 72 y.o. female, DOB - 1946/07/18, XLK:[id number]  Admit date - 06/16/2018   Admitting Physician Ihor Austin, MD  Outpatient Primary MD for the patient is Barbette Reichmann, MD   LOS - 1  Subjective: Patient admitted with shortness of breath continues to have shortness of breath and wheezing Complains of some pain in the upper abdomen related to cough   Review of Systems:   CONSTITUTIONAL: No documented fever. No fatigue, weakness. No weight gain, no weight loss.  EYES: No blurry or double vision.  ENT: No tinnitus. No postnasal drip. No redness of the oropharynx.  RESPIRATORY: Positive cough, no wheeze, no hemoptysis.  Positive dyspnea.  CARDIOVASCULAR: No chest pain. No orthopnea. No palpitations. No syncope.  GASTROINTESTINAL: No nausea, no vomiting or diarrhea. No abdominal pain. No melena or hematochezia.  GENITOURINARY: No dysuria or hematuria.  ENDOCRINE: No polyuria or nocturia. No heat or cold intolerance.  HEMATOLOGY: No anemia. No bruising. No bleeding.  INTEGUMENTARY: No rashes. No lesions.  MUSCULOSKELETAL: No arthritis. No swelling. No gout.  NEUROLOGIC: No numbness, tingling, or ataxia. No seizure-type activity.  PSYCHIATRIC: No anxiety. No insomnia. No ADD.    Vitals:   Vitals:   06/17/18 0754 06/17/18 1152 06/17/18 1335 06/17/18 1437  BP:  (!) 119/58  111/79  Pulse:  77  90  Resp:  20  20  Temp:  98.8 F (37.1 C)  98 F (36.7 C)  TempSrc:  Oral  Oral  SpO2: 94% 95% 95% 94%  Weight:      Height:        Wt Readings from Last 3 Encounters:  06/16/18 105.2 kg  05/28/18 93 kg  04/19/18 93 kg     Intake/Output Summary (Last 24 hours) at 06/17/2018 1530 Last data filed at 06/17/2018  1036 Gross per 24 hour  Intake 240 ml  Output 800 ml  Net -560 ml    Physical Exam:   GENERAL: Pleasant-appearing in no apparent distress.  HEAD, EYES, EARS, NOSE AND THROAT: Atraumatic, normocephalic. Extraocular muscles are intact. Pupils equal and reactive to light. Sclerae anicteric. No conjunctival injection. No oro-pharyngeal erythema.  NECK: Supple. There is no jugular venous distention. No bruits, no lymphadenopathy, no thyromegaly.  HEART: Regular rate and rhythm,. No murmurs, no rubs, no clicks.  LUNGS: Bilateral wheezing throughout both lungs without any accessory muscle usage ABDOMEN: Soft, flat, nontender, nondistended.  Has good bowel sounds. No hepatosplenomegaly appreciated.  EXTREMITIES: No evidence of any cyanosis, clubbing, or peripheral edema.  +2 pedal and radial pulses bilaterally.  NEUROLOGIC: The patient is alert, awake, and oriented x3 with no focal motor or sensory deficits appreciated bilaterally.  SKIN: Moist and warm with no rashes appreciated.  Psych: Not anxious, depressed LN: No inguinal LN enlargement    Antibiotics   Anti-infectives (From admission, onward)   None      Medications   Scheduled Meds: . aspirin  81 mg Oral Daily  . atorvastatin  40 mg Oral Daily  . carvedilol  25 mg Oral BID  . cholecalciferol  1,000 Units Oral Daily  . enoxaparin (LOVENOX) injection  40 mg Subcutaneous Q12H  . gabapentin  1,200 mg Oral BID  . ipratropium-albuterol  3 mL Nebulization Q6H  . losartan  25 mg Oral Daily  . mouth rinse  15 mL Mouth Rinse BID  . methylPREDNISolone (SOLU-MEDROL) injection  60 mg Intravenous Q6H  . mirtazapine  15 mg Oral QHS  . pantoprazole  40 mg Oral Daily  . sertraline  50 mg Oral Daily  . sodium chloride flush  3 mL Intravenous Q12H  . spironolactone  25 mg Oral Daily  . torsemide  20 mg Oral Daily  . traMADol  50 mg Oral BID  . vitamin B-12  1,000 mcg Oral Daily   Continuous Infusions: . sodium chloride     PRN  Meds:.sodium chloride, acetaminophen **OR** acetaminophen, ondansetron **OR** ondansetron (ZOFRAN) IV, phenol, senna-docusate, sodium chloride flush, traZODone   Data Review:   Micro Results No results found for this or any previous visit (from the past 240 hour(s)).  Radiology Reports Dg Chest 2 View  Result Date: 06/16/2018 CLINICAL DATA:  Dyspnea x2 days. EXAM: CHEST - 2 VIEW COMPARISON:  CT 05/28/2018 and CXR 05/28/2018 FINDINGS: Stable cardiomegaly with interstitial edema. Tortuous atherosclerotic aorta is identified. There is bibasilar atelectasis. No effusion. Mild degenerative change along the dorsal spine. And screw fixation of the right midclavicle. Osteoarthritis of the [facility] and glenohumeral joints bilaterally. IMPRESSION: Stable cardiomegaly with interstitial edema. Bibasilar atelectasis. Electronically Signed   By: Tollie Eth M.D.   On: 06/16/2018 20:35   Dg Chest 2 View  Result Date: 05/28/2018 CLINICAL DATA:  Shortness of breath. EXAM: CHEST - 2 VIEW COMPARISON:  Chest x-ray dated April 04, 2018. FINDINGS: Stable cardiomegaly. Normal pulmonary vascularity. Diffuse mildly coarsened interstitial markings are similar to prior studies and likely smoking-related. The lungs remain hyperinflated. No focal consolidation, pleural effusion, or pneumothorax. No acute osseous abnormality. IMPRESSION: 1.  No active cardiopulmonary disease.  Stable cardiomegaly. 2. COPD. Electronically Signed   By: Obie Dredge M.D.   On: 05/28/2018 20:04   Ct Angio Chest Pe W And/or Wo Contrast  Result Date: 05/28/2018 CLINICAL DATA:  72 year old female with difficulty breathing. Concern for pulmonary embolism. EXAM: CT ANGIOGRAPHY CHEST WITH CONTRAST TECHNIQUE: Multidetector CT imaging of the chest was performed using the standard protocol during bolus administration of intravenous contrast. Multiplanar CT image reconstructions and MIPs were obtained to evaluate the vascular anatomy. CONTRAST:  61mL  ISOVUE-370 IOPAMIDOL (ISOVUE-370) INJECTION 76% COMPARISON:  Chest radiograph dated 05/28/2018 and CT dated 09/18/2017 FINDINGS: Cardiovascular: Borderline cardiomegaly. No pericardial effusion. There is 3 vessel coronary vascular calcification. There is advanced atherosclerotic calcification of the thoracic aorta. There is decreased opacification of the proximal left subclavian artery which may be related to a degree of stenosis or atherosclerotic changes. This is similar to  prior CT and likely chronic. There is no CT evidence of pulmonary embolism. Mediastinum/Nodes: No hilar or mediastinal adenopathy. Esophagus and the thyroid gland are grossly unremarkable. No mediastinal fluid collection. Lungs/Pleura: There is background of mild to moderate emphysema. There is no focal consolidation, pleural effusion, or pneumothorax. Several subpleural nodules measure up to 4 mm in the right lower lobe (series 6, image 64). Left lung base atelectatic changes/scarring noted. The central airways are patent. Upper Abdomen: Subcentimeter hypodense focus in the left lobe of the liver is not well characterized. The visualized upper abdomen is otherwise unremarkable. Musculoskeletal: No chest wall abnormality. No acute or significant osseous findings. T5 and T6 hemangioma. Review of the MIP images confirms the above findings. IMPRESSION: 1. No acute intrathoracic pathology. No CT evidence of pulmonary embolism. 2. Mild to moderate emphysema. No focal consolidation. 3. Several subpleural nodules measuring up to 4 mm in the right lower lobe. 4. Advanced atherosclerotic calcification of the thoracic aorta. Electronically Signed   By: Elgie Collard M.D.   On: 05/28/2018 22:13     CBC Recent Labs  Lab 06/16/18 2001 06/17/18 0423  WBC 6.3 5.1  HGB 11.0* 10.6*  HCT 35.3* 33.8*  PLT 163 162  MCV 94.6 93.1  MCH 29.5 29.2  MCHC 31.2 31.4  RDW 14.3 13.8    Chemistries  Recent Labs  Lab 06/16/18 2001 06/17/18 0423  NA  141 142  K 4.4 3.7  CL 102 103  CO2 32 31  GLUCOSE 114* 195*  BUN 15 16  CREATININE 1.20* 1.08*  CALCIUM 8.4* 8.3*   ------------------------------------------------------------------------------------------------------------------ estimated creatinine clearance is 52 mL/min (A) (by C-G formula based on SCr of 1.08 mg/dL (H)). ------------------------------------------------------------------------------------------------------------------ No results for input(s): HGBA1C in the last 72 hours. ------------------------------------------------------------------------------------------------------------------ No results for input(s): CHOL, HDL, LDLCALC, TRIG, CHOLHDL, LDLDIRECT in the last 72 hours. ------------------------------------------------------------------------------------------------------------------ No results for input(s): TSH, T4TOTAL, T3FREE, THYROIDAB in the last 72 hours.  Invalid input(s): FREET3 ------------------------------------------------------------------------------------------------------------------ No results for input(s): VITAMINB12, FOLATE, FERRITIN, TIBC, IRON, RETICCTPCT in the last 72 hours.  Coagulation profile No results for input(s): INR, PROTIME in the last 168 hours.  No results for input(s): DDIMER in the last 72 hours.  Cardiac Enzymes Recent Labs  Lab 06/16/18 2247 06/17/18 0423 06/17/18 1037  TROPONINI <0.03 <0.03 <0.03   ------------------------------------------------------------------------------------------------------------------ Invalid input(s): POCBNP    Assessment & Plan   IMPRESSION AND PLAN: 72 year old female patient with history of chronic systolic and diastolic heart failure, COPD on home oxygen, hypertension, nonischemic cardiomyopathy presented to the emergency room with increased shortness of breath and wheezing  -Acute COPD exacerbation Continue IV Solu-Medrol 60 mg every 6 hourly Continue nebulization  treatments Oxygen via nasal cannula I will add Pulmicort nebs Mucinex Oral doxycycline for acute bronchitis  -Combined chronic systolic and diastolic heart failure Stable Continue medical management and diuresis  -History of nonischemic cardiomyopathy Continue aspirin, statin, losartan and beta-blocker  -DVT prophylaxis with low     Code Status Orders  (From admission, onward)         Start     Ordered   06/16/18 2215  Full code  Continuous     06/16/18 2214        Code Status History    Date Active Date Inactive Code Status Order ID Comments User Context   05/29/2018 0208 05/29/2018 1534 Full Code [id number]  Oralia Manis, MD ED   03/13/2017 1205 03/18/2017 1809 Full Code [id number]  Gracelyn Nurse, MD Inpatient   06/03/2016  1045 06/05/2016 1630 Full Code [id number]  Shaune Pollack, MD Inpatient           Consults none  DVT Prophylaxis  Lovenox     Lab Results  Component Value Date   PLT 162 06/17/2018     Time Spent in minutes   Greater than 50% of time spent in care coordination and counseling patient regarding the condition and plan of care.   Auburn Bilberry M.D on 06/17/2018 at 3:30 PM  Between 7am to 6pm - Pager - [phone]  After 6pm go to [email or web address] - Social research officer, government  [employer]   Office  [phone]

## 2018-06-17 NOTE — Care Management Note (Signed)
Case Management Note  Patient Details  Name: [name] [name] MRN: [id number] Date of Birth: 22-Sep-1945  Subjective/Objective:                 Admitted from home with exacerbation of COPD/chf.  Has chronic home oxygen. Current with her PCP- Dr Marcello Fennel and no issues with transportation or obtaining medications.  has access to scales   Action/Plan:  Would benefit from HF/COPD protocols and maybe outpatient palliaitve  Expected Discharge Date:                  Expected Discharge Plan:     In-House Referral:     Discharge planning Services     Post Acute Care Choice:    Choice offered to:     DME Arranged:    DME Agency:     HH Arranged:    HH Agency:     Status of Service:     If discussed at [employer], dates discussed:    Additional Comments:  Eber Hong, RN 06/17/2018, 7:03 PM

## 2018-06-18 MED ORDER — POLYETHYLENE GLYCOL 3350 17 GM/SCOOP PO POWD
1.0000 | Freq: Once | ORAL | Status: DC
  Filled 2018-06-18: qty 255

## 2018-06-18 MED ORDER — SENNOSIDES-DOCUSATE SODIUM 8.6-50 MG PO TABS
2.0000 | ORAL_TABLET | Freq: Every day | ORAL | Status: DC
  Administered 2018-06-18: 2 via ORAL
  Filled 2018-06-18: qty 2

## 2018-06-18 MED ORDER — POLYETHYLENE GLYCOL 3350 17 G PO PACK
17.0000 g | PACK | Freq: Once | ORAL | Status: AC
  Administered 2018-06-18: 20:00:00 17 g via ORAL
  Filled 2018-06-18: qty 1

## 2018-06-18 NOTE — Plan of Care (Signed)
  Problem: Respiratory: Goal: Ability to maintain a clear airway will improve Outcome: Progressing   Problem: Activity: Goal: Risk for activity intolerance will decrease Outcome: Progressing

## 2018-06-18 NOTE — Plan of Care (Signed)
  Problem: Respiratory: Goal: Ability to maintain a clear airway will improve Outcome: Progressing Expiratory wheezes bil upper lobes Goal: Levels of oxygenation will improve Outcome: Progressing  Pt on chronic 4L O2

## 2018-06-18 NOTE — Progress Notes (Signed)
[employer] - [place] at [facility]                                                                                                                                                                                  Patient Demographics   [name] [name], is a 72 y.o. female, DOB - [birth year], ZOX:[id number]  Admit date - 06/16/2018   Admitting Physician Ihor Austin, MD  Outpatient Primary MD for the patient is Barbette Reichmann, MD   LOS - 2  Subjective: Continues to complain of intermittent shortness of breath, improved from admission  Review of Systems:   CONSTITUTIONAL: No documented fever. No fatigue, weakness. No weight gain, no weight loss.  EYES: No blurry or double vision.  ENT: No tinnitus. No postnasal drip. No redness of the oropharynx.  RESPIRATORY: Positive cough, no wheeze, no hemoptysis.  Positive dyspnea.  CARDIOVASCULAR: No chest pain. No orthopnea. No palpitations. No syncope.  GASTROINTESTINAL: No nausea, no vomiting or diarrhea. No abdominal pain. No melena or hematochezia.  GENITOURINARY: No dysuria or hematuria.  ENDOCRINE: No polyuria or nocturia. No heat or cold intolerance.  HEMATOLOGY: No anemia. No bruising. No bleeding.  INTEGUMENTARY: No rashes. No lesions.  MUSCULOSKELETAL: No arthritis. No swelling. No gout.  NEUROLOGIC: No numbness, tingling, or ataxia. No seizure-type activity.  PSYCHIATRIC: No anxiety. No insomnia. No ADD.    Vitals:   Vitals:   06/17/18 2007 06/17/18 2255 06/18/18 0210 06/18/18 0421  BP: (!) 114/51 (!) 116/59  (!) 107/47  Pulse: 78 78  66  Resp: 18   17  Temp: 98 F (36.7 C)   97.6 F (36.4 C)  TempSrc: Oral   Oral  SpO2: 97% 95% 97% 97%  Weight:      Height:        Wt Readings from Last 3 Encounters:  06/16/18 105.2 kg  05/28/18 93 kg  04/19/18 93 kg     Intake/Output Summary (Last 24 hours) at 06/18/2018 1207 Last data filed at 06/18/2018 1007 Gross per 24 hour  Intake 700 ml  Output 1000 ml   Net -300 ml    Physical Exam:   GENERAL: Pleasant-appearing in no apparent distress.  HEAD, EYES, EARS, NOSE AND THROAT: Atraumatic, normocephalic. Extraocular muscles are intact. Pupils equal and reactive to light. Sclerae anicteric. No conjunctival injection. No oro-pharyngeal erythema.  NECK: Supple. There is no jugular venous distention. No bruits, no lymphadenopathy, no thyromegaly.  HEART: Regular rate and rhythm,. No murmurs, no rubs, no clicks.  LUNGS: Bilateral wheezing throughout both lungs without any accessory muscle usage ABDOMEN: Soft, flat, nontender, nondistended. Has good bowel sounds. No hepatosplenomegaly appreciated.  EXTREMITIES: No evidence of any  cyanosis, clubbing, or peripheral edema.  +2 pedal and radial pulses bilaterally.  NEUROLOGIC: The patient is alert, awake, and oriented x3 with no focal motor or sensory deficits appreciated bilaterally.  SKIN: Moist and warm with no rashes appreciated.  Psych: Not anxious, depressed LN: No inguinal LN enlargement    Antibiotics   Anti-infectives (From admission, onward)   None      Medications   Scheduled Meds: . aspirin  81 mg Oral Daily  . atorvastatin  40 mg Oral Daily  . budesonide (PULMICORT) nebulizer solution  0.25 mg Nebulization BID  . carvedilol  25 mg Oral BID  . cholecalciferol  1,000 Units Oral Daily  . enoxaparin (LOVENOX) injection  40 mg Subcutaneous Q12H  . gabapentin  1,200 mg Oral BID  . guaiFENesin  600 mg Oral BID  . ipratropium-albuterol  3 mL Nebulization Q6H  . losartan  25 mg Oral Daily  . mouth rinse  15 mL Mouth Rinse BID  . methylPREDNISolone (SOLU-MEDROL) injection  60 mg Intravenous Q6H  . mirtazapine  15 mg Oral QHS  . pantoprazole  40 mg Oral Daily  . polyethylene glycol powder  1 Container Oral Once  . senna-docusate  2 tablet Oral QHS  . sertraline  50 mg Oral Daily  . sodium chloride flush  3 mL Intravenous Q12H  . spironolactone  25 mg Oral Daily  . torsemide  20 mg  Oral Daily  . traMADol  50 mg Oral BID  . vitamin B-12  1,000 mcg Oral Daily   Continuous Infusions: . sodium chloride     PRN Meds:.sodium chloride, acetaminophen **OR** acetaminophen, guaiFENesin-codeine, ondansetron **OR** ondansetron (ZOFRAN) IV, phenol, sodium chloride flush, traZODone   Data Review:   Micro Results No results found for this or any previous visit (from the past 240 hour(s)).  Radiology Reports Dg Chest 2 View  Result Date: 06/16/2018 CLINICAL DATA:  Dyspnea x2 days. EXAM: CHEST - 2 VIEW COMPARISON:  CT 05/28/2018 and CXR 05/28/2018 FINDINGS: Stable cardiomegaly with interstitial edema. Tortuous atherosclerotic aorta is identified. There is bibasilar atelectasis. No effusion. Mild degenerative change along the dorsal spine. And screw fixation of the right midclavicle. Osteoarthritis of the [facility] and glenohumeral joints bilaterally. IMPRESSION: Stable cardiomegaly with interstitial edema. Bibasilar atelectasis. Electronically Signed   By: Tollie Eth M.D.   On: 06/16/2018 20:35   Dg Chest 2 View  Result Date: 05/28/2018 CLINICAL DATA:  Shortness of breath. EXAM: CHEST - 2 VIEW COMPARISON:  Chest x-ray dated April 04, 2018. FINDINGS: Stable cardiomegaly. Normal pulmonary vascularity. Diffuse mildly coarsened interstitial markings are similar to prior studies and likely smoking-related. The lungs remain hyperinflated. No focal consolidation, pleural effusion, or pneumothorax. No acute osseous abnormality. IMPRESSION: 1.  No active cardiopulmonary disease.  Stable cardiomegaly. 2. COPD. Electronically Signed   By: Obie Dredge M.D.   On: 05/28/2018 20:04   Ct Angio Chest Pe W And/or Wo Contrast  Result Date: 05/28/2018 CLINICAL DATA:  72 year old female with difficulty breathing. Concern for pulmonary embolism. EXAM: CT ANGIOGRAPHY CHEST WITH CONTRAST TECHNIQUE: Multidetector CT imaging of the chest was performed using the standard protocol during bolus administration  of intravenous contrast. Multiplanar CT image reconstructions and MIPs were obtained to evaluate the vascular anatomy. CONTRAST:  52mL ISOVUE-370 IOPAMIDOL (ISOVUE-370) INJECTION 76% COMPARISON:  Chest radiograph dated 05/28/2018 and CT dated 09/18/2017 FINDINGS: Cardiovascular: Borderline cardiomegaly. No pericardial effusion. There is 3 vessel coronary vascular calcification. There is advanced atherosclerotic calcification of the thoracic aorta. There is  decreased opacification of the proximal left subclavian artery which may be related to a degree of stenosis or atherosclerotic changes. This is similar to prior CT and likely chronic. There is no CT evidence of pulmonary embolism. Mediastinum/Nodes: No hilar or mediastinal adenopathy. Esophagus and the thyroid gland are grossly unremarkable. No mediastinal fluid collection. Lungs/Pleura: There is background of mild to moderate emphysema. There is no focal consolidation, pleural effusion, or pneumothorax. Several subpleural nodules measure up to 4 mm in the right lower lobe (series 6, image 64). Left lung base atelectatic changes/scarring noted. The central airways are patent. Upper Abdomen: Subcentimeter hypodense focus in the left lobe of the liver is not well characterized. The visualized upper abdomen is otherwise unremarkable. Musculoskeletal: No chest wall abnormality. No acute or significant osseous findings. T5 and T6 hemangioma. Review of the MIP images confirms the above findings. IMPRESSION: 1. No acute intrathoracic pathology. No CT evidence of pulmonary embolism. 2. Mild to moderate emphysema. No focal consolidation. 3. Several subpleural nodules measuring up to 4 mm in the right lower lobe. 4. Advanced atherosclerotic calcification of the thoracic aorta. Electronically Signed   By: Elgie Collard M.D.   On: 05/28/2018 22:13     CBC Recent Labs  Lab 06/16/18 2001 06/17/18 0423  WBC 6.3 5.1  HGB 11.0* 10.6*  HCT 35.3* 33.8*  PLT 163 162   MCV 94.6 93.1  MCH 29.5 29.2  MCHC 31.2 31.4  RDW 14.3 13.8    Chemistries  Recent Labs  Lab 06/16/18 2001 06/17/18 0423  NA 141 142  K 4.4 3.7  CL 102 103  CO2 32 31  GLUCOSE 114* 195*  BUN 15 16  CREATININE 1.20* 1.08*  CALCIUM 8.4* 8.3*   ------------------------------------------------------------------------------------------------------------------ estimated creatinine clearance is 52 mL/min (A) (by C-G formula based on SCr of 1.08 mg/dL (H)). ------------------------------------------------------------------------------------------------------------------ No results for input(s): HGBA1C in the last 72 hours. ------------------------------------------------------------------------------------------------------------------ No results for input(s): CHOL, HDL, LDLCALC, TRIG, CHOLHDL, LDLDIRECT in the last 72 hours. ------------------------------------------------------------------------------------------------------------------ No results for input(s): TSH, T4TOTAL, T3FREE, THYROIDAB in the last 72 hours.  Invalid input(s): FREET3 ------------------------------------------------------------------------------------------------------------------ No results for input(s): VITAMINB12, FOLATE, FERRITIN, TIBC, IRON, RETICCTPCT in the last 72 hours.  Coagulation profile No results for input(s): INR, PROTIME in the last 168 hours.  No results for input(s): DDIMER in the last 72 hours.  Cardiac Enzymes Recent Labs  Lab 06/16/18 2247 06/17/18 0423 06/17/18 1037  TROPONINI <0.03 <0.03 <0.03   ------------------------------------------------------------------------------------------------------------------ Invalid input(s): POCBNP    Assessment & Plan   IMPRESSION AND PLAN: 72 year old female patient with history of chronic systolic and diastolic heart failure, COPD on home oxygen, hypertension, nonischemic cardiomyopathy presented to the emergency room with increased  shortness of breath and wheezing  *Acute On COPD exacerbation Resolving Continue IV Solu-Medrol with tapering as tolerated, aggressive pulmonary toilet with bronchodilator therapy, inhaled corticosteroids, empiric doxycycline, and wean O2 as tolerated  On 4 L via nasal cannula continuous at home   *Acute on chronic hypoxic respiratory failure  Secondary to above  Plan of care per above   *Combined chronic systolic and diastolic heart failure Stable Continue medical management and diuresis  *History of nonischemic cardiomyopathy Stable Continue aspirin, statin, losartan and beta-blocker      Code Status Orders  (From admission, onward)         Start     Ordered   06/16/18 2215  Full code  Continuous     06/16/18 2214        Code  Status History    Date Active Date Inactive Code Status Order ID Comments User Context   05/29/2018 0208 05/29/2018 1534 Full Code [id number]  Oralia Manis, MD ED   03/13/2017 1205 03/18/2017 1809 Full Code [id number]  Gracelyn Nurse, MD Inpatient   06/03/2016 1045 06/05/2016 1630 Full Code [id number]  Shaune Pollack, MD Inpatient           Consults none  DVT Prophylaxis  Lovenox     Lab Results  Component Value Date   PLT 162 06/17/2018     Time Spent in minutes   Greater than 50% of time spent in care coordination and counseling patient regarding the condition and plan of care.   Evelena Asa Brylan Dec M.D on 06/18/2018 at 12:07 PM  Between 7am to 6pm - Pager - [phone]  After 6pm go to [email or web address] - Social research officer, government  [employer]   Office  [phone]

## 2018-06-18 NOTE — Progress Notes (Signed)
Family Meeting Note  Advance Directive:yes  Today a meeting took place with the Patient.  Patient is able to participate   The following clinical team members were present during this meeting:MD  The following were discussed:Patient's diagnosis: Acute on chronic hypoxic respiratory failure, Patient's progosis: Unable to determine and Goals for treatment: Full Code  Additional follow-up to be provided: prn  Time spent during discussion:20 minutes  Montell D Salary, MD  

## 2018-06-19 MED ORDER — GUAIFENESIN ER 600 MG PO TB12: 600.0000 mg | ORAL_TABLET | Freq: Two times a day (BID) | ORAL | 0 refills | Status: DC

## 2018-06-19 MED ORDER — ALUM & MAG HYDROXIDE-SIMETH 200-200-20 MG/5ML PO SUSP
30.0000 mL | Freq: Four times a day (QID) | ORAL | Status: DC | PRN
  Administered 2018-06-19: 13:00:00 30 mL via ORAL
  Filled 2018-06-19: qty 30

## 2018-06-19 MED ORDER — PREDNISONE 50 MG PO TABS: 50.0000 mg | ORAL_TABLET | Freq: Every day | ORAL | 0 refills | Status: DC

## 2018-06-19 NOTE — Care Management Note (Signed)
Case Management Note  Patient Details  Name: [name] [name] MRN: [id number] Date of Birth: 13-Feb-1946  Subjective/Objective:   Patient to be discharged per MD order. Orders in place for home health services. Patient agreeable and agrees she would benefit from COPD/CHF protocol. Familiar with [facility] and prefers to use them. Signed forms by MD given to [facility] with [facility]. Referral confirmed for services. Patient has wheelchair, chronic O2, walker and bedside commode for DME. No further needs. Family to transport.                   Action/Plan:   Expected Discharge Date:  06/19/18               Expected Discharge Plan:  Home w Home Health Services  In-House Referral:     Discharge planning Services  CM Consult  Post Acute Care Choice:  Home Health Choice offered to:  Patient  DME Arranged:    DME Agency:     HH Arranged:  RN, PT, Nurse's Aide, Social Work [employer] Agency:  [facility]  Status of Service:  Completed, signed off  If discussed at [employer], dates discussed:    Additional Comments:  Virgel Manifold, RN 06/19/2018, 10:19 AM

## 2018-06-19 NOTE — Progress Notes (Addendum)
MD order received to discharge pt home today with home health; Josh with Case Management previously established home health services with [facility] (see separate note); verbally reviewed AVS with pt, gave Rxs to pt; no questions voiced at this time; pt's discharge pending her getting dressed

## 2018-06-19 NOTE — Progress Notes (Signed)
Pt discharged via wheelchair by nursing to the visitor's entrance 

## 2018-06-19 NOTE — Discharge Summary (Signed)
[facility] [place] at [facility]   PATIENT NAME: [name] [name]    MR#:  [id number]  DATE OF BIRTH:  Feb 20, 1946  DATE OF ADMISSION:  06/16/2018 ADMITTING PHYSICIAN: Ihor Austin, MD  DATE OF DISCHARGE: No discharge date for patient encounter.  PRIMARY CARE PHYSICIAN: Barbette Reichmann, MD    ADMISSION DIAGNOSIS:  COPD exacerbation (HCC) [J44.1] Dyspnea, unspecified type [R06.00] Congestive heart failure, unspecified HF chronicity, unspecified heart failure type (HCC) [I50.9]  DISCHARGE DIAGNOSIS:  Active Problems:   COPD exacerbation (HCC)   SECONDARY DIAGNOSIS:   Past Medical History:  Diagnosis Date  . Asthma   . Chronic combined systolic (congestive) and diastolic (congestive) heart failure (HCC)    a. 02/2017 Echo: EF 25-30%, Gr2 DD.  Marland Kitchen COPD (chronic obstructive pulmonary disease) (HCC)    a. On supplemental O2 @ home.  Marland Kitchen Hypertension   . Lung nodule   . Narrow complex tachycardia (HCC)    a. 02/2017 - ? NSVT vs Afib-->placed on amio.  No OAC.  Marland Kitchen NICM (nonischemic cardiomyopathy) (HCC)    a. 02/2017 Echo: Ef 25-30%, mild conc LVH, Gr2 DD, sev MR, mod dil LA;  b. 02/2016 Cath [facility]): nonobs dzs.  . Non-obstructive CAD    a. 02/2016 Cath [facility]): LM 30, LAD 20ost/p, 9m, 30d, D1 50p, D2 30, D3 20, RI 20, LCX 40p, OM1 30, OM2 20, RCA 20 diffuse, RPL2 30, RPL3 30.  Marland Kitchen Severe mitral regurgitation    a. 02/2017 Echo: Sev MR.    HOSPITAL COURSE:   72 year old female patient with history of chronic systolic and diastolic heart failure, COPD on home oxygen, hypertension, nonischemic cardiomyopathy presented to the emergency room with increased shortness of breath and wheezing  *Acute On COPD exacerbation Resolved Treated with IV Solu-Medrol with tapering as tolerated, provided aggressive pulmonary toilet with bronchodilator therapy, inhaled corticosteroids, empiric doxycycline, and patient was successfully weaned to her O2 requirement of 4 L via nasal  cannula continuous  We will arrange for outpatient pulmonary follow-up in 1 week status post discharge to establish care    *Acute on chronic hypoxic respiratory failure  Secondary to above  Plan of care per above   *Combined chronic systolic and diastolic heart failure Stable Continued medical management and diuresis  *History of nonischemic cardiomyopathy Stable Continued aspirin, statin, losartan and beta-blocker   DISCHARGE CONDITIONS:   stable  CONSULTS OBTAINED:    DRUG ALLERGIES:   Allergies  Allergen Reactions  . Sulfa Antibiotics Itching    Pt states she gets "itching inside and out"  . Cymbalta [Duloxetine Hcl] Nausea Only  . Tramadol Hcl Other (See Comments)    Falls, confusion    DISCHARGE MEDICATIONS:   Allergies as of 06/19/2018      Reactions   Sulfa Antibiotics Itching   Pt states she gets "itching inside and out"   Cymbalta [duloxetine Hcl] Nausea Only   Tramadol Hcl Other (See Comments)   Falls, confusion      Medication List    TAKE these medications   acetaminophen 500 MG tablet Commonly known as:  TYLENOL Take 2 tablets (1,000 mg total) by mouth 3 (three) times daily.   albuterol 108 (90 Base) MCG/ACT inhaler Commonly known as:  PROVENTIL HFA;VENTOLIN HFA Inhale 2 puffs into the lungs every 6 (six) hours as needed for wheezing or shortness of breath.   aspirin 81 MG chewable tablet Chew 81 mg by mouth daily.   atorvastatin 40 MG tablet Commonly known as:  LIPITOR Take 1 tablet (40 mg total) by mouth daily.   B-12 1000 MCG Tabs Take 1 tablet by mouth daily.   budesonide-formoterol 160-4.5 MCG/ACT inhaler Commonly known as:  SYMBICORT Inhale 2 puffs into the lungs 2 (two) times daily.   carvedilol 25 MG tablet Commonly known as:  COREG Take 1 tablet (25 mg total) by mouth 2 (two) times daily.   fluticasone 50 MCG/ACT nasal spray Commonly known as:  FLONASE Place 2 sprays into both nostrils daily.   gabapentin 600 MG  tablet Commonly known as:  NEURONTIN Take 1,200 mg by mouth 2 (two) times daily.   guaiFENesin 600 MG 12 hr tablet Commonly known as:  MUCINEX Take 1 tablet (600 mg total) by mouth 2 (two) times daily.   losartan 25 MG tablet Commonly known as:  COZAAR Take 1 tablet (25 mg total) by mouth daily.   mirtazapine 30 MG tablet Commonly known as:  REMERON Take 30 mg by mouth at bedtime.   omeprazole 40 MG capsule Commonly known as:  PRILOSEC Take 1 capsule (40 mg total) by mouth daily.   OXYGEN Inhale 4 L into the lungs.   pantoprazole 40 MG tablet Commonly known as:  PROTONIX Take 40 mg by mouth 2 (two) times daily.   predniSONE 50 MG tablet Commonly known as:  DELTASONE Take 1 tablet (50 mg total) by mouth daily with breakfast. 1 p.o. daily What changed:    medication strength  additional instructions   promethazine 25 MG tablet Commonly known as:  PHENERGAN Take 0.5 tablets (12.5 mg total) by mouth every 6 (six) hours as needed for nausea.   sertraline 50 MG tablet Commonly known as:  ZOLOFT Take 50 mg by mouth daily.   spironolactone 25 MG tablet Commonly known as:  ALDACTONE Take 1 tablet (25 mg total) by mouth daily.   torsemide 20 MG tablet Commonly known as:  DEMADEX Take 20 mg by mouth daily.   traMADol 50 MG tablet Commonly known as:  ULTRAM Take 50 mg by mouth 2 (two) times daily.   traZODone 50 MG tablet Commonly known as:  DESYREL Take 1 tablet (50 mg total) by mouth at bedtime as needed for sleep.   Vitamin D3 25 MCG (1000 UT) Caps Take 1 capsule by mouth daily.        DISCHARGE INSTRUCTIONS:      If you experience worsening of your admission symptoms, develop shortness of breath, life threatening emergency, suicidal or homicidal thoughts you must seek medical attention immediately by calling 911 or calling your MD immediately  if symptoms less severe.  You Must read complete instructions/literature along with all the possible adverse  reactions/side effects for all the Medicines you take and that have been prescribed to you. Take any new Medicines after you have completely understood and accept all the possible adverse reactions/side effects.   Please note  You were cared for by a hospitalist during your hospital stay. If you have any questions about your discharge medications or the care you received while you were in the hospital after you are discharged, you can call the unit and asked to speak with the hospitalist on call if the hospitalist that took care of you is not available. Once you are discharged, your primary care physician will handle any further medical issues. Please note that NO REFILLS for any discharge medications will be authorized once you are discharged, as it is imperative that you return to your primary care physician (or establish  a relationship with a primary care physician if you do not have one) for your aftercare needs so that they can reassess your need for medications and monitor your lab values.    Today   CHIEF COMPLAINT:   Chief Complaint  Patient presents with  . Shortness of Breath    HISTORY OF PRESENT ILLNESS:  72 y.o. female with a known history of COPD, chronic systolic and diastolic heart failure, hypertension, nonischemic cardiomyopathy presented to the emergency room with increased shortness of breath and wheezing for the last few days.  Patient has occasional dry cough.  Patient was evaluated in the emergency room was found to be COPD exacerbation.  Patient received IV steroids and nebulization treatments.  Hospitalist service was consulted.  Patient is on home oxygen.   VITAL SIGNS:  Blood pressure (!) 106/48, pulse 75, temperature 97.9 F (36.6 C), temperature source Oral, resp. rate 19, height 5\' 2"  (1.575 m), weight 105.2 kg, SpO2 98 %.  I/O:    Intake/Output Summary (Last 24 hours) at 06/19/2018 0929 Last data filed at 06/19/2018 0834 Gross per 24 hour  Intake 820 ml   Output 950 ml  Net -130 ml    PHYSICAL EXAMINATION:  GENERAL:  72 y.o.-year-old patient lying in the bed with no acute distress.  EYES: Pupils equal, round, reactive to light and accommodation. No scleral icterus. Extraocular muscles intact.  HEENT: Head atraumatic, normocephalic. Oropharynx and nasopharynx clear.  NECK:  Supple, no jugular venous distention. No thyroid enlargement, no tenderness.  LUNGS: Normal breath sounds bilaterally, no wheezing, rales,rhonchi or crepitation. No use of accessory muscles of respiration.  CARDIOVASCULAR: S1, S2 normal. No murmurs, rubs, or gallops.  ABDOMEN: Soft, non-tender, non-distended. Bowel sounds present. No organomegaly or mass.  EXTREMITIES: No pedal edema, cyanosis, or clubbing.  NEUROLOGIC: Cranial nerves II through XII are intact. Muscle strength 5/5 in all extremities. Sensation intact. Gait not checked.  PSYCHIATRIC: The patient is alert and oriented x 3.  SKIN: No obvious rash, lesion, or ulcer.   DATA REVIEW:   CBC Recent Labs  Lab 06/17/18 0423  WBC 5.1  HGB 10.6*  HCT 33.8*  PLT 162    Chemistries  Recent Labs  Lab 06/17/18 0423  NA 142  K 3.7  CL 103  CO2 31  GLUCOSE 195*  BUN 16  CREATININE 1.08*  CALCIUM 8.3*    Cardiac Enzymes Recent Labs  Lab 06/17/18 1037  TROPONINI <0.03    Microbiology Results  Results for orders placed or performed during the hospital encounter of 05/28/18  MRSA PCR Screening     Status: None   Collection Time: 05/29/18  2:45 AM  Result Value Ref Range Status   MRSA by PCR NEGATIVE NEGATIVE Final    Comment:        The GeneXpert MRSA Assay (FDA approved for NASAL specimens only), is one component of a comprehensive MRSA colonization surveillance program. It is not intended to diagnose MRSA infection nor to guide or monitor treatment for MRSA infections. Performed at [facility], [address] [place], [place] [address]     RADIOLOGY:  No results  found.  EKG:   Orders placed or performed during the hospital encounter of 06/16/18  . ED EKG  . ED EKG  . EKG 12-Lead  . EKG 12-Lead      Management plans discussed with the patient, family and they are in agreement.  CODE STATUS:     Code Status Orders  (From admission,  onward)         Start     Ordered   06/16/18 2215  Full code  Continuous     06/16/18 2214        Code Status History    Date Active Date Inactive Code Status Order ID Comments User Context   05/29/2018 0208 05/29/2018 1534 Full Code [id number]  Oralia Manis, MD ED   03/13/2017 1205 03/18/2017 1809 Full Code [id number]  Gracelyn Nurse, MD Inpatient   06/03/2016 1045 06/05/2016 1630 Full Code [id number]  Shaune Pollack, MD Inpatient      TOTAL TIME TAKING CARE OF THIS PATIENT: 40 minutes.    Evelena Asa  M.D on 06/19/2018 at 9:29 AM  Between 7am to 6pm - Pager - [phone]  After 6pm go to [email or web address] - password [email or web address]  [facility]  Office  [phone]  CC: Primary care physician; Barbette Reichmann, MD   Note: This dictation was prepared with Dragon dictation along with smaller phrase technology. Any transcriptional errors that result from this process are unintentional.

## 2018-06-22 ENCOUNTER — Ambulatory Visit (INDEPENDENT_AMBULATORY_CARE_PROVIDER_SITE_OTHER): Payer: Medicare Other | Admitting: Gastroenterology

## 2018-06-22 ENCOUNTER — Encounter: Payer: Self-pay | Admitting: Gastroenterology

## 2018-06-22 DIAGNOSIS — R112 Nausea with vomiting, unspecified: Secondary | ICD-10-CM

## 2018-06-22 MED ORDER — ONDANSETRON 4 MG PO TBDP: 4.0000 mg | ORAL_TABLET | Freq: Three times a day (TID) | ORAL | 1 refills | Status: DC | PRN

## 2018-06-22 NOTE — Progress Notes (Signed)
Gastroenterology Consultation  Referring Provider:     Barbette Reichmann, MD Primary Care Physician:  Barbette Reichmann, MD Primary Gastroenterologist:  Dr. Servando Snare     Reason for Consultation:     Nausea        HPI:   [name] [name] is a 72 y.o. y/o female referred for consultation & management of nausea by Dr. Barbette Reichmann, MD.  This patient comes in today with chronic nausea.  The patient appears to have had an appointment back in May with me but called in during the weekend to cancel the appointment.  Patient has a history of hypertension, congestive heart failure, COPD on home oxygen and cardiomyopathy.  She was recently in the hospital with COPD exacerbation and discharged on 24 November.  The patient has had multiple visits to the hospital for shortness of breath and COPD exacerbation with acute on chronic respiratory failure.  The patient reports that her nausea is present throughout the day.  The patient takes approximately 2 Phenergan's per day for the nausea.  It is not associated with eating or drinking.  She also denies any change in bowel habits.  The patient has not had a colonoscopy in over 15 years because she states that she has such bad lungs and COPD.  Patient also reports that she has her gallbladder in and denies any heartburn.  Past Medical History:  Diagnosis Date  . Asthma   . Chronic combined systolic (congestive) and diastolic (congestive) heart failure (HCC)    a. 02/2017 Echo: EF 25-30%, Gr2 DD.  Marland Kitchen COPD (chronic obstructive pulmonary disease) (HCC)    a. On supplemental O2 @ home.  Marland Kitchen Hypertension   . Lung nodule   . Narrow complex tachycardia (HCC)    a. 02/2017 - ? NSVT vs Afib-->placed on amio.  No OAC.  Marland Kitchen NICM (nonischemic cardiomyopathy) (HCC)    a. 02/2017 Echo: Ef 25-30%, mild conc LVH, Gr2 DD, sev MR, mod dil LA;  b. 02/2016 Cath [facility]): nonobs dzs.  . Non-obstructive CAD    a. 02/2016 Cath [facility]): LM 30, LAD 20ost/p, 45m, 30d, D1 50p, D2 30, D3 20, RI  20, LCX 40p, OM1 30, OM2 20, RCA 20 diffuse, RPL2 30, RPL3 30.  Marland Kitchen Severe mitral regurgitation    a. 02/2017 Echo: Sev MR.    Past Surgical History:  Procedure Laterality Date  . REPLACEMENT TOTAL KNEE    . TYMPANOSTOMY TUBE PLACEMENT Bilateral 03/19/2017    Prior to Admission medications   Medication Sig Start Date End Date Taking? Authorizing Provider  acetaminophen (TYLENOL) 500 MG tablet Take 2 tablets (1,000 mg total) by mouth 3 (three) times daily. 08/02/17   Plonk, Chrissie Noa, MD  albuterol (PROVENTIL HFA;VENTOLIN HFA) 108 (90 Base) MCG/ACT inhaler Inhale 2 puffs into the lungs every 6 (six) hours as needed for wheezing or shortness of breath. 12/07/17   Duanne Limerick, MD  aspirin 81 MG chewable tablet Chew 81 mg by mouth daily.    [provider]  atorvastatin (LIPITOR) 40 MG tablet Take 1 tablet (40 mg total) by mouth daily. 12/07/17   Duanne Limerick, MD  budesonide-formoterol (SYMBICORT) 160-4.5 MCG/ACT inhaler Inhale 2 puffs into the lungs 2 (two) times daily. 12/07/17   Duanne Limerick, MD  carvedilol (COREG) 25 MG tablet Take 1 tablet (25 mg total) by mouth 2 (two) times daily. 12/07/17   Duanne Limerick, MD  Cholecalciferol (VITAMIN D3) 1000 units CAPS Take 1 capsule by mouth daily.  07/24/17 07/24/18  [provider]  Cyanocobalamin (B-12) 1000 MCG TABS Take 1 tablet by mouth daily. 04/05/17   Plonk, Chrissie Noa, MD  fluticasone (FLONASE) 50 MCG/ACT nasal spray Place 2 sprays into both nostrils daily. 12/07/17   Duanne Limerick, MD  gabapentin (NEURONTIN) 600 MG tablet Take 1,200 mg by mouth 2 (two) times daily. 04/28/18   [provider]  guaiFENesin (MUCINEX) 600 MG 12 hr tablet Take 1 tablet (600 mg total) by mouth 2 (two) times daily. 06/19/18   Salary, Evelena Asa, MD  losartan (COZAAR) 25 MG tablet Take 1 tablet (25 mg total) by mouth daily. 12/07/17   Duanne Limerick, MD  mirtazapine (REMERON) 30 MG tablet Take 30 mg by mouth at bedtime.  03/24/18   [provider]  omeprazole (PRILOSEC) 40 MG capsule Take 1 capsule (40 mg total) by mouth daily. Patient not taking: Reported on 06/17/2018 12/07/17 12/07/18  Duanne Limerick, MD  OXYGEN Inhale 4 L into the lungs.     [provider]  pantoprazole (PROTONIX) 40 MG tablet Take 40 mg by mouth 2 (two) times daily. 06/03/18   [provider]  predniSONE (DELTASONE) 50 MG tablet Take 1 tablet (50 mg total) by mouth daily with breakfast. 1 p.o. daily 06/19/18   Salary, Evelena Asa, MD  promethazine (PHENERGAN) 25 MG tablet Take 0.5 tablets (12.5 mg total) by mouth every 6 (six) hours as needed for nausea. 08/16/17   Plonk, Chrissie Noa, MD  sertraline (ZOLOFT) 50 MG tablet Take 50 mg by mouth daily. 04/22/18   [provider]  spironolactone (ALDACTONE) 25 MG tablet Take 1 tablet (25 mg total) by mouth daily. 12/07/17   Duanne Limerick, MD  torsemide (DEMADEX) 20 MG tablet Take 20 mg by mouth daily. 04/22/18   [provider]  traMADol (ULTRAM) 50 MG tablet Take 50 mg by mouth 2 (two) times daily.  04/13/18   [provider]  traZODone (DESYREL) 50 MG tablet Take 1 tablet (50 mg total) by mouth at bedtime as needed for sleep. 12/13/17   Duanne Limerick, MD    Family History  Problem Relation Age of Onset  . Heart disease Mother   . Heart disease Sister   . Cancer Sister      Social History   Tobacco Use  . Smoking status: Former Smoker    Packs/day: 3.00    Years: 55.00    Pack years: 165.00    Types: Cigarettes    Last attempt to quit: 2018    Years since quitting: 1.9  . Smokeless tobacco: Never Used  . Tobacco comment: smoking cessation materials not required  Substance Use Topics  . Alcohol use: No  . Drug use: No    Allergies as of 06/22/2018 - Review Complete 06/16/2018  Allergen Reaction Noted  . Sulfa antibiotics Itching 01/02/2015  . Cymbalta [duloxetine hcl] Nausea Only 11/10/2017  . Tramadol hcl Other (See Comments) 10/05/2017    Review  of Systems:    All systems reviewed and negative except where noted in HPI.   Physical Exam:  There were no vitals taken for this visit. No LMP recorded. Patient has had a hysterectomy. General:   Alert,  Well-developed, well-nourished, pleasant and cooperative in NAD patient on nasal cannula oxygen Head:  Normocephalic and atraumatic. Eyes:  Sclera clear, no icterus.   Conjunctiva pink. Ears:  Normal auditory acuity. Nose:  No deformity, discharge, or lesions. Mouth:  No deformity or lesions,oropharynx pink &  moist. Neck:  Supple; no masses or thyromegaly. Lungs:  Respirations even and unlabored.  Clear throughout to auscultation.   Positive rhonchi. No acute distress. Heart:  Regular rate and rhythm; no murmurs, clicks, rubs, or gallops. Abdomen:  Normal bowel sounds.  No bruits.  Soft, non-tender and non-distended without masses, hepatosplenomegaly or hernias noted.  No guarding or rebound tenderness.  Negative Carnett sign.   Rectal:  Deferred.  Msk:  Symmetrical without gross deformities.  Good, equal movement & strength bilaterally. Pulses:  Normal pulses noted. Extremities:  No clubbing or edema.  No cyanosis. Neurologic:  Alert and oriented x3;  grossly normal neurologically. Skin:  Intact without significant lesions or rashes.  No jaundice. Lymph Nodes:  No significant cervical adenopathy. Psych:  Alert and cooperative. Normal mood and affect.  Imaging Studies: Dg Chest 2 View  Result Date: 06/16/2018 CLINICAL DATA:  Dyspnea x2 days. EXAM: CHEST - 2 VIEW COMPARISON:  CT 05/28/2018 and CXR 05/28/2018 FINDINGS: Stable cardiomegaly with interstitial edema. Tortuous atherosclerotic aorta is identified. There is bibasilar atelectasis. No effusion. Mild degenerative change along the dorsal spine. And screw fixation of the right midclavicle. Osteoarthritis of the [facility] and glenohumeral joints bilaterally. IMPRESSION: Stable cardiomegaly with interstitial edema. Bibasilar atelectasis.  Electronically Signed   By: Tollie Eth M.D.   On: 06/16/2018 20:35   Dg Chest 2 View  Result Date: 05/28/2018 CLINICAL DATA:  Shortness of breath. EXAM: CHEST - 2 VIEW COMPARISON:  Chest x-ray dated April 04, 2018. FINDINGS: Stable cardiomegaly. Normal pulmonary vascularity. Diffuse mildly coarsened interstitial markings are similar to prior studies and likely smoking-related. The lungs remain hyperinflated. No focal consolidation, pleural effusion, or pneumothorax. No acute osseous abnormality. IMPRESSION: 1.  No active cardiopulmonary disease.  Stable cardiomegaly. 2. COPD. Electronically Signed   By: Obie Dredge M.D.   On: 05/28/2018 20:04   Ct Angio Chest Pe W And/or Wo Contrast  Result Date: 05/28/2018 CLINICAL DATA:  72 year old female with difficulty breathing. Concern for pulmonary embolism. EXAM: CT ANGIOGRAPHY CHEST WITH CONTRAST TECHNIQUE: Multidetector CT imaging of the chest was performed using the standard protocol during bolus administration of intravenous contrast. Multiplanar CT image reconstructions and MIPs were obtained to evaluate the vascular anatomy. CONTRAST:  60mL ISOVUE-370 IOPAMIDOL (ISOVUE-370) INJECTION 76% COMPARISON:  Chest radiograph dated 05/28/2018 and CT dated 09/18/2017 FINDINGS: Cardiovascular: Borderline cardiomegaly. No pericardial effusion. There is 3 vessel coronary vascular calcification. There is advanced atherosclerotic calcification of the thoracic aorta. There is decreased opacification of the proximal left subclavian artery which may be related to a degree of stenosis or atherosclerotic changes. This is similar to prior CT and likely chronic. There is no CT evidence of pulmonary embolism. Mediastinum/Nodes: No hilar or mediastinal adenopathy. Esophagus and the thyroid gland are grossly unremarkable. No mediastinal fluid collection. Lungs/Pleura: There is background of mild to moderate emphysema. There is no focal consolidation, pleural effusion, or  pneumothorax. Several subpleural nodules measure up to 4 mm in the right lower lobe (series 6, image 64). Left lung base atelectatic changes/scarring noted. The central airways are patent. Upper Abdomen: Subcentimeter hypodense focus in the left lobe of the liver is not well characterized. The visualized upper abdomen is otherwise unremarkable. Musculoskeletal: No chest wall abnormality. No acute or significant osseous findings. T5 and T6 hemangioma. Review of the MIP images confirms the above findings. IMPRESSION: 1. No acute intrathoracic pathology. No CT evidence of pulmonary embolism. 2. Mild to moderate emphysema. No focal consolidation. 3. Several subpleural nodules measuring up to  4 mm in the right lower lobe. 4. Advanced atherosclerotic calcification of the thoracic aorta. Electronically Signed   By: Elgie Collard M.D.   On: 05/28/2018 22:13    Assessment and Plan:   Redonna Schatz is a 72 y.o. y/o female who comes in today with chronic nausea for approximately 1 year.  The patient states that her nausea is not associated with any GI function is eating drinking or moving her bowels.  The patient states that the nausea is present throughout the day and is relieved somewhat by Phenergan.  The patient has significant respiratory impairment and will be switched over to Zofran since the Phenergan can cause her to become sleepy.  The patient also will be set up for an upper GI series to look for any sign of reflux or peptic ulcer disease.  She will also be set up for a gallbladder ultrasound to rule out any gallbladder pathology and a gastric emptying study to look for any sign of gastroparesis as the cause of her chronic nausea.  If these are negative it is likely that something outside of the GI tract is causing her chronic nausea such as her chronic medical conditions or even the medication she is taking.  The patient is already on Protonix twice a day and has not been helped with acid suppression in  the past.  The patient has been explained the plan and agrees with it.  [name] Minium, MD. Clementeen Graham    Note: This dictation was prepared with Dragon dictation along with smaller phrase technology. Any transcriptional errors that result from this process are unintentional.

## 2018-06-22 NOTE — Patient Instructions (Addendum)
You are scheduled for a RUQ abdominal US at [facility] on Thursday, Dec 19th at 9:00am. Please arrive at the [facility] registration desk at 8:45am.   You are scheduled for a gastric emptying study at [facility] on Thursday, Dec 19th.   You are scheduled for a upper GI series on Friday, Dec 6th at 9:30am. Please arrive at the medical mall registration desk at 9:15am.    If you need to reschedule this appointment for any reason, please contact central scheduling at [phone].

## 2018-06-25 ENCOUNTER — Other Ambulatory Visit: Payer: Self-pay

## 2018-06-25 ENCOUNTER — Emergency Department
Admission: EM | Admit: 2018-06-25 | Discharge: 2018-06-25 | Disposition: A | Discharge status: Home / Self Care | Payer: Medicare Other | Attending: Emergency Medicine | Admitting: Emergency Medicine

## 2018-06-25 DIAGNOSIS — N183 Chronic kidney disease, stage 3 (moderate): Secondary | ICD-10-CM | POA: Diagnosis not present

## 2018-06-25 DIAGNOSIS — Z7982 Long term (current) use of aspirin: Secondary | ICD-10-CM | POA: Insufficient documentation

## 2018-06-25 DIAGNOSIS — I130 Hypertensive heart and chronic kidney disease with heart failure and stage 1 through stage 4 chronic kidney disease, or unspecified chronic kidney disease: Secondary | ICD-10-CM | POA: Diagnosis not present

## 2018-06-25 DIAGNOSIS — I5042 Chronic combined systolic (congestive) and diastolic (congestive) heart failure: Secondary | ICD-10-CM | POA: Insufficient documentation

## 2018-06-25 DIAGNOSIS — Z79899 Other long term (current) drug therapy: Secondary | ICD-10-CM | POA: Insufficient documentation

## 2018-06-25 DIAGNOSIS — J449 Chronic obstructive pulmonary disease, unspecified: Secondary | ICD-10-CM | POA: Insufficient documentation

## 2018-06-25 DIAGNOSIS — Z87891 Personal history of nicotine dependence: Secondary | ICD-10-CM | POA: Insufficient documentation

## 2018-06-25 DIAGNOSIS — A0811 Acute gastroenteropathy due to Norwalk agent: Secondary | ICD-10-CM | POA: Insufficient documentation

## 2018-06-25 DIAGNOSIS — R197 Diarrhea, unspecified: Secondary | ICD-10-CM | POA: Diagnosis not present

## 2018-06-25 DIAGNOSIS — R1110 Vomiting, unspecified: Secondary | ICD-10-CM | POA: Diagnosis present

## 2018-06-25 LAB — COMPREHENSIVE METABOLIC PANEL
ALT: 18 U/L (ref 0–44)
ANION GAP: 11 (ref 5–15)
AST: 20 U/L (ref 15–41)
Albumin: 3.5 g/dL (ref 3.5–5.0)
Alkaline Phosphatase: 43 U/L (ref 38–126)
BUN: 35 mg/dL — ABNORMAL HIGH (ref 8–23)
CHLORIDE: 104 mmol/L (ref 98–111)
CO2: 29 mmol/L (ref 22–32)
CREATININE: 1.15 mg/dL — AB (ref 0.44–1.00)
Calcium: 7.5 mg/dL — ABNORMAL LOW (ref 8.9–10.3)
GFR, EST AFRICAN AMERICAN: 54 mL/min — AB (ref >60)
GFR, EST NON AFRICAN AMERICAN: 47 mL/min — AB (ref >60)
Glucose, Bld: 123 mg/dL — ABNORMAL HIGH (ref 70–99)
Potassium: 4.8 mmol/L (ref 3.5–5.1)
SODIUM: 144 mmol/L (ref 135–145)
Total Bilirubin: 0.8 mg/dL (ref 0.3–1.2)
Total Protein: 6.5 g/dL (ref 6.5–8.1)

## 2018-06-25 LAB — GASTROINTESTINAL PANEL BY PCR, STOOL (REPLACES STOOL CULTURE)
ADENOVIRUS F40/41: NOT DETECTED
Astrovirus: NOT DETECTED
CAMPYLOBACTER SPECIES: NOT DETECTED
CRYPTOSPORIDIUM: NOT DETECTED
CYCLOSPORA CAYETANENSIS: NOT DETECTED
ENTEROPATHOGENIC E COLI (EPEC): NOT DETECTED
Entamoeba histolytica: NOT DETECTED
Enteroaggregative E coli (EAEC): NOT DETECTED
Enterotoxigenic E coli (ETEC): NOT DETECTED
Giardia lamblia: NOT DETECTED
Norovirus GI/GII: DETECTED — AB
PLESIMONAS SHIGELLOIDES: NOT DETECTED
ROTAVIRUS A: NOT DETECTED
SALMONELLA SPECIES: NOT DETECTED
SHIGA LIKE TOXIN PRODUCING E COLI (STEC): NOT DETECTED
SHIGELLA/ENTEROINVASIVE E COLI (EIEC): NOT DETECTED
Sapovirus (I, II, IV, and V): NOT DETECTED
VIBRIO SPECIES: NOT DETECTED
Vibrio cholerae: NOT DETECTED
YERSINIA ENTEROCOLITICA: NOT DETECTED

## 2018-06-25 LAB — CBC WITH DIFFERENTIAL/PLATELET
Abs Immature Granulocytes: 0.16 10*3/uL — ABNORMAL HIGH (ref 0.00–0.07)
Basophils Absolute: 0 10*3/uL (ref 0.0–0.1)
Basophils Relative: 0 %
EOS ABS: 0.1 10*3/uL (ref 0.0–0.5)
EOS PCT: 1 %
HCT: 42.2 % (ref 36.0–46.0)
HEMOGLOBIN: 13 g/dL (ref 12.0–15.0)
IMMATURE GRANULOCYTES: 2 %
LYMPHS ABS: 0.5 10*3/uL — AB (ref 0.7–4.0)
LYMPHS PCT: 6 %
MCH: 29.3 pg (ref 26.0–34.0)
MCHC: 30.8 g/dL (ref 30.0–36.0)
MCV: 95 fL (ref 80.0–100.0)
MONOS PCT: 6 %
Monocytes Absolute: 0.5 10*3/uL (ref 0.1–1.0)
NEUTROS PCT: 85 %
NRBC: 0.2 % (ref 0.0–0.2)
Neutro Abs: 7.5 10*3/uL (ref 1.7–7.7)
Platelets: 195 10*3/uL (ref 150–400)
RBC: 4.44 MIL/uL (ref 3.87–5.11)
RDW: 14.3 % (ref 11.5–15.5)
WBC: 8.8 10*3/uL (ref 4.0–10.5)

## 2018-06-25 LAB — TROPONIN I: TROPONIN I: 0.03 ng/mL — AB (ref <0.03)

## 2018-06-25 MED ORDER — LOPERAMIDE HCL 2 MG PO CAPS
4.0000 mg | ORAL_CAPSULE | Freq: Once | ORAL | Status: AC
  Administered 2018-06-25: 4 mg via ORAL
  Filled 2018-06-25: qty 2

## 2018-06-25 MED ORDER — ONDANSETRON 4 MG PO TBDP: 4.0000 mg | ORAL_TABLET | Freq: Three times a day (TID) | ORAL | 0 refills | Status: DC | PRN

## 2018-06-25 MED ORDER — LOPERAMIDE HCL 2 MG PO TABS: 2.0000 mg | ORAL_TABLET | Freq: Four times a day (QID) | ORAL | 0 refills | Status: DC | PRN

## 2018-06-25 MED ORDER — ONDANSETRON HCL 4 MG/2ML IJ SOLN
4.0000 mg | Freq: Once | INTRAMUSCULAR | Status: AC
  Administered 2018-06-25: 4 mg via INTRAVENOUS
  Filled 2018-06-25: qty 2

## 2018-06-25 MED ORDER — PROMETHAZINE HCL 25 MG/ML IJ SOLN
12.5000 mg | Freq: Once | INTRAMUSCULAR | Status: AC
  Administered 2018-06-25: 12.5 mg via INTRAVENOUS
  Filled 2018-06-25: qty 1

## 2018-06-25 MED ORDER — SODIUM CHLORIDE 0.9 % IV BOLUS
1000.0000 mL | Freq: Once | INTRAVENOUS | Status: AC
  Administered 2018-06-25: 1000 mL via INTRAVENOUS

## 2018-06-25 NOTE — ED Notes (Signed)
Pt verbalized she was incontinent and could not ambulate to the restroom. Pt able to roll and provide minimal assistance in changing the sheets and pts depends. Pt cleaned.

## 2018-06-25 NOTE — ED Notes (Signed)
Pt wad given SL Zofran by ems prior to hospital. Ems states pt has not had any emesis since medication. Pt still complaining of nausea at this time

## 2018-06-25 NOTE — ED Notes (Signed)
Date and time results received: 06/25/18   Test: troponin  Critical Value: 0.03  Name of Provider Notified: Paduchowski  Orders Received? Or Actions Taken?: MD notified

## 2018-06-25 NOTE — ED Triage Notes (Signed)
Pt arrives via ems from home with report of N/V/D since 4 am. Pt reports she was around niece on thanksgiving with same symptoms. Pt states her bed sheets had to be changed 3 Times this morning due to urgency with diarrhea. No acute distress noted at this time.

## 2018-06-25 NOTE — ED Provider Notes (Signed)
[facility] Emergency Department Provider Note  Time seen: 7:56 AM  I have reviewed the triage vital signs and the nursing notes.   HISTORY  Chief Complaint Emesis and Diarrhea   HPI [name] [name] is a 72 y.o. female with a past medical history of CHF, COPD, CAD, presents to the emergency department for nausea vomiting diarrhea.  According to the patient on Thanksgiving (2 days ago) her niece had similar symptoms.  This morning around 4:00 she awoke with nausea, dry heaving and several episodes of very loose diarrhea.  Patient's son awoke this morning with the same symptoms.  Patient denies any fever chest pain or trouble breathing.  States she had 3 accidents in her bed due to diarrhea so she came to the emergency department.  Positive for abdominal bloating but denies any pain.   Past Medical History:  Diagnosis Date  . Asthma   . Chronic combined systolic (congestive) and diastolic (congestive) heart failure (HCC)    a. 02/2017 Echo: EF 25-30%, Gr2 DD.  Marland Kitchen COPD (chronic obstructive pulmonary disease) (HCC)    a. On supplemental O2 @ home.  Marland Kitchen Hypertension   . Lung nodule   . Narrow complex tachycardia (HCC)    a. 02/2017 - ? NSVT vs Afib-->placed on amio.  No OAC.  Marland Kitchen NICM (nonischemic cardiomyopathy) (HCC)    a. 02/2017 Echo: Ef 25-30%, mild conc LVH, Gr2 DD, sev MR, mod dil LA;  b. 02/2016 Cath [facility]): nonobs dzs.  . Non-obstructive CAD    a. 02/2016 Cath [facility]): LM 30, LAD 20ost/p, 65m, 30d, D1 50p, D2 30, D3 20, RI 20, LCX 40p, OM1 30, OM2 20, RCA 20 diffuse, RPL2 30, RPL3 30.  Marland Kitchen Severe mitral regurgitation    a. 02/2017 Echo: Sev MR.    Patient Active Problem List   Diagnosis Date Noted  . COPD exacerbation (HCC) 06/16/2018  . COPD with acute exacerbation (HCC) 05/29/2018  . Severe mitral regurgitation 11/10/2017  . OA (osteoarthritis) of knee 11/10/2017  . CKD (chronic kidney disease) stage 3, GFR 30-59 ml/min (HCC) 08/20/2017  . Insomnia 07/22/2017   . Hyperlipidemia 07/01/2017  . DDD (degenerative disc disease), lumbar 04/30/2017  . B12 deficiency 04/30/2017  . Gait instability 03/31/2017  . Allergic rhinitis 03/31/2017  . Nausea 03/31/2017  . GERD (gastroesophageal reflux disease) 03/31/2017  . Chronic systolic heart failure (HCC) 03/24/2017  . HTN (hypertension) 03/24/2017  . COPD (chronic obstructive pulmonary disease) (HCC) 03/24/2017  . Chronic pain 03/24/2017  . MRSA carrier 03/18/2017  . History of ventricular tachycardia 03/18/2017  . Vitamin D deficiency 01/06/2017  . Chronic atrial fibrillation 06/15/2016  . Depression 06/15/2016  . Varicose veins of lower extremity with inflammation 09/07/2011    Past Surgical History:  Procedure Laterality Date  . REPLACEMENT TOTAL KNEE    . TYMPANOSTOMY TUBE PLACEMENT Bilateral 03/19/2017    Prior to Admission medications   Medication Sig Start Date End Date Taking? Authorizing Provider  acetaminophen (TYLENOL) 500 MG tablet Take 2 tablets (1,000 mg total) by mouth 3 (three) times daily. 08/02/17   Plonk, Chrissie Noa, MD  albuterol (PROVENTIL HFA;VENTOLIN HFA) 108 (90 Base) MCG/ACT inhaler Inhale 2 puffs into the lungs every 6 (six) hours as needed for wheezing or shortness of breath. 12/07/17   Duanne Limerick, MD  aspirin 81 MG chewable tablet Chew 81 mg by mouth daily.    [provider]  atorvastatin (LIPITOR) 40 MG tablet Take 1 tablet (40 mg total) by mouth  daily. 12/07/17   Duanne Limerick, MD  budesonide-formoterol [facility]) 160-4.5 MCG/ACT inhaler Inhale 2 puffs into the lungs 2 (two) times daily. 12/07/17   Duanne Limerick, MD  carvedilol (COREG) 25 MG tablet Take 1 tablet (25 mg total) by mouth 2 (two) times daily. 12/07/17   Duanne Limerick, MD  Cholecalciferol (VITAMIN D3) 1000 units CAPS Take 1 capsule by mouth daily.  07/24/17 07/24/18  [provider]  Cyanocobalamin (B-12) 1000 MCG TABS Take 1 tablet by mouth daily. 04/05/17   Plonk, Chrissie Noa, MD   fluticasone (FLONASE) 50 MCG/ACT nasal spray Place 2 sprays into both nostrils daily. 12/07/17   Duanne Limerick, MD  gabapentin (NEURONTIN) 600 MG tablet Take 1,200 mg by mouth 2 (two) times daily. 04/28/18   [provider]  guaiFENesin (MUCINEX) 600 MG 12 hr tablet Take 1 tablet (600 mg total) by mouth 2 (two) times daily. 06/19/18   Salary, Evelena Asa, MD  losartan (COZAAR) 25 MG tablet Take 1 tablet (25 mg total) by mouth daily. 12/07/17   Duanne Limerick, MD  mirtazapine (REMERON) 30 MG tablet Take 30 mg by mouth at bedtime.  03/24/18   [provider]  omeprazole (PRILOSEC) 40 MG capsule Take 1 capsule (40 mg total) by mouth daily. Patient not taking: Reported on 06/17/2018 12/07/17 12/07/18  Duanne Limerick, MD  ondansetron (ZOFRAN ODT) 4 MG disintegrating tablet Take 1 tablet (4 mg total) by mouth every 8 (eight) hours as needed for nausea or vomiting. 06/22/18   Midge Minium, MD  OXYGEN Inhale 4 L into the lungs.     [provider]  pantoprazole (PROTONIX) 40 MG tablet Take 40 mg by mouth 2 (two) times daily. 06/03/18   [provider]  predniSONE (DELTASONE) 50 MG tablet Take 1 tablet (50 mg total) by mouth daily with breakfast. 1 p.o. daily 06/19/18   Salary, Evelena Asa, MD  promethazine (PHENERGAN) 25 MG tablet Take 0.5 tablets (12.5 mg total) by mouth every 6 (six) hours as needed for nausea. 08/16/17   Plonk, Chrissie Noa, MD  sertraline (ZOLOFT) 50 MG tablet Take 50 mg by mouth daily. 04/22/18   [provider]  spironolactone (ALDACTONE) 25 MG tablet Take 1 tablet (25 mg total) by mouth daily. 12/07/17   Duanne Limerick, MD  torsemide (DEMADEX) 20 MG tablet Take 20 mg by mouth daily. 04/22/18   [provider]  traMADol (ULTRAM) 50 MG tablet Take 50 mg by mouth 2 (two) times daily.  04/13/18   [provider]  traZODone (DESYREL) 50 MG tablet Take 1 tablet (50 mg total) by mouth at bedtime as needed for sleep. 12/13/17   Duanne Limerick,  MD    Allergies  Allergen Reactions  . Sulfa Antibiotics Itching    Pt states she gets "itching inside and out"  . Cymbalta [Duloxetine Hcl] Nausea Only  . Tramadol Hcl Other (See Comments)    Falls, confusion    Family History  Problem Relation Age of Onset  . Heart disease Mother   . Heart disease Sister   . Cancer Sister     Social History Social History   Tobacco Use  . Smoking status: Former Smoker    Packs/day: 3.00    Years: 55.00    Pack years: 165.00    Types: Cigarettes    Last attempt to quit: 2018    Years since quitting: 1.9  . Smokeless tobacco: Never Used  . Tobacco comment: smoking cessation  materials not required  Substance Use Topics  . Alcohol use: No  . Drug use: No    Review of Systems Constitutional: Negative for fever. Cardiovascular: Negative for chest pain. Respiratory: Negative for shortness of breath. Gastrointestinal: Describes abdominal bloating but no pain.  Positive for nausea, dry heaving and diarrhea Genitourinary: Negative for urinary compaints Musculoskeletal: Negative for musculoskeletal complaints Skin: Negative for skin complaints  Neurological: Negative for headache All other ROS negative  ____________________________________________   PHYSICAL EXAM:  VITAL SIGNS: ED Triage Vitals  Enc Vitals Group     BP 06/25/18 0737 (!) 153/66     Pulse Rate 06/25/18 0737 93     Resp 06/25/18 0737 18     Temp 06/25/18 0737 98 F (36.7 C)     Temp Source 06/25/18 0737 Oral     SpO2 06/25/18 0737 99 %     Weight 06/25/18 0738 232 lb (105.2 kg)     Height 06/25/18 0738 5\' 2"  (1.575 m)     Head Circumference --      Peak Flow --      Pain Score 06/25/18 0738 10     Pain Loc --      Pain Edu? --      Excl. in GC? --     Constitutional: Alert and oriented. Well appearing and in no distress. Eyes: Normal exam ENT   Head: Normocephalic and atraumatic.   Mouth/Throat: Mucous membranes are moist. Cardiovascular: Normal  rate, regular rhythm.  Respiratory: Normal respiratory effort without tachypnea nor retractions. Breath sounds are clear and equal bilaterally. No wheezes/rales/rhonchi. Gastrointestinal: Soft, no significant tenderness to palpation.  No rebound guarding.  Hyperactive bowel sounds. Musculoskeletal: Nontender with normal range of motion in all extremities. Neurologic:  Normal speech and language. No gross focal neurologic deficits Skin:  Skin is warm, dry and intact.   Psychiatric: Mood and affect are normal. Speech and behavior are normal.   ____________________________________________    EKG  EKG reviewed and interpreted by myself shows a normal sinus rhythm at 93 bpm with a narrow QRS, normal axis, normal intervals, nonspecific ST changes.  ____________________________________________   INITIAL IMPRESSION / ASSESSMENT AND PLAN / ED COURSE  Pertinent labs & imaging results that were available during my care of the patient were reviewed by me and considered in my medical decision making (see chart for details).  Patient presents to the emergency department for nausea vomiting diarrhea.  Patient was around her niece 2 days ago similar symptoms.  Son awoke this morning with similar symptoms.  History seems very consistent with gastroenteritis likely viral gastroenteritis such as norovirus.  We will check labs, treat with fluids, Zofran and loperamide and continue to closely monitor.  Patient's GI panel has resulted positive for norovirus which fits the patient's clinical presentation.  We will discharge with Zofran and loperamide as well as instructions for supportive care.  ____________________________________________   FINAL CLINICAL IMPRESSION(S) / ED DIAGNOSES  Gastroenteritis    Minna Antis, MD 06/25/18 1212

## 2018-06-25 NOTE — ED Notes (Signed)
This RN called pts sister and son listed in contacts in order to find a contact number for pts daughter-in-law. Both calls went to voicemail and a message was left for both son and sister to call Emergency department at their earliest convenience. PT updated.   PT verbalized there was a sheet brought with her by EMS that had the phone number on it. This RN is unable to find that number at this time.

## 2018-06-25 NOTE — ED Notes (Signed)
Pt verbalized understanding of discharge instructions. NAD at this time. 

## 2018-06-30 ENCOUNTER — Ambulatory Visit: Payer: Medicare Other | Admitting: Gastroenterology

## 2018-07-01 ENCOUNTER — Ambulatory Visit: Payer: Medicare Other

## 2018-07-14 ENCOUNTER — Telehealth: Payer: Self-pay | Admitting: Gastroenterology

## 2018-07-14 ENCOUNTER — Ambulatory Visit: Payer: Medicare Other | Attending: Gastroenterology

## 2018-07-14 NOTE — Telephone Encounter (Signed)
KIM  From [facility] u/s  Is calling to inform us pt did not show up for her u/s this morning

## 2018-08-02 ENCOUNTER — Observation Stay
Admission: EM | Admit: 2018-08-02 | Discharge: 2018-08-04 | Disposition: A | Discharge status: Home / Self Care | Payer: Medicare Other | Attending: Family Medicine | Admitting: Family Medicine

## 2018-08-02 ENCOUNTER — Encounter: Payer: Self-pay | Admitting: *Deleted

## 2018-08-02 ENCOUNTER — Other Ambulatory Visit: Payer: Self-pay

## 2018-08-02 ENCOUNTER — Emergency Department: Payer: Medicare Other

## 2018-08-02 DIAGNOSIS — I5022 Chronic systolic (congestive) heart failure: Secondary | ICD-10-CM | POA: Diagnosis present

## 2018-08-02 DIAGNOSIS — Z8249 Family history of ischemic heart disease and other diseases of the circulatory system: Secondary | ICD-10-CM | POA: Insufficient documentation

## 2018-08-02 DIAGNOSIS — I2510 Atherosclerotic heart disease of native coronary artery without angina pectoris: Secondary | ICD-10-CM | POA: Diagnosis not present

## 2018-08-02 DIAGNOSIS — E785 Hyperlipidemia, unspecified: Secondary | ICD-10-CM | POA: Diagnosis present

## 2018-08-02 DIAGNOSIS — I428 Other cardiomyopathies: Secondary | ICD-10-CM | POA: Insufficient documentation

## 2018-08-02 DIAGNOSIS — I130 Hypertensive heart and chronic kidney disease with heart failure and stage 1 through stage 4 chronic kidney disease, or unspecified chronic kidney disease: Secondary | ICD-10-CM | POA: Insufficient documentation

## 2018-08-02 DIAGNOSIS — Z96659 Presence of unspecified artificial knee joint: Secondary | ICD-10-CM | POA: Diagnosis not present

## 2018-08-02 DIAGNOSIS — N183 Chronic kidney disease, stage 3 unspecified: Secondary | ICD-10-CM | POA: Diagnosis present

## 2018-08-02 DIAGNOSIS — I5042 Chronic combined systolic (congestive) and diastolic (congestive) heart failure: Secondary | ICD-10-CM | POA: Diagnosis not present

## 2018-08-02 DIAGNOSIS — R000 Tachycardia, unspecified: Secondary | ICD-10-CM | POA: Insufficient documentation

## 2018-08-02 DIAGNOSIS — Z79899 Other long term (current) drug therapy: Secondary | ICD-10-CM | POA: Diagnosis not present

## 2018-08-02 DIAGNOSIS — Z7951 Long term (current) use of inhaled steroids: Secondary | ICD-10-CM | POA: Diagnosis not present

## 2018-08-02 DIAGNOSIS — Z882 Allergy status to sulfonamides status: Secondary | ICD-10-CM | POA: Diagnosis not present

## 2018-08-02 DIAGNOSIS — Z888 Allergy status to other drugs, medicaments and biological substances status: Secondary | ICD-10-CM | POA: Diagnosis not present

## 2018-08-02 DIAGNOSIS — Z87891 Personal history of nicotine dependence: Secondary | ICD-10-CM | POA: Insufficient documentation

## 2018-08-02 DIAGNOSIS — J441 Chronic obstructive pulmonary disease with (acute) exacerbation: Principal | ICD-10-CM | POA: Insufficient documentation

## 2018-08-02 DIAGNOSIS — R262 Difficulty in walking, not elsewhere classified: Secondary | ICD-10-CM | POA: Insufficient documentation

## 2018-08-02 DIAGNOSIS — I10 Essential (primary) hypertension: Secondary | ICD-10-CM | POA: Diagnosis present

## 2018-08-02 DIAGNOSIS — M6281 Muscle weakness (generalized): Secondary | ICD-10-CM | POA: Insufficient documentation

## 2018-08-02 DIAGNOSIS — Z7982 Long term (current) use of aspirin: Secondary | ICD-10-CM | POA: Diagnosis not present

## 2018-08-02 DIAGNOSIS — I4891 Unspecified atrial fibrillation: Secondary | ICD-10-CM | POA: Insufficient documentation

## 2018-08-02 DIAGNOSIS — R9431 Abnormal electrocardiogram [ECG] [EKG]: Secondary | ICD-10-CM | POA: Insufficient documentation

## 2018-08-02 DIAGNOSIS — I491 Atrial premature depolarization: Secondary | ICD-10-CM | POA: Diagnosis not present

## 2018-08-02 DIAGNOSIS — K219 Gastro-esophageal reflux disease without esophagitis: Secondary | ICD-10-CM | POA: Diagnosis present

## 2018-08-02 LAB — CBC
HCT: 38.5 % (ref 36.0–46.0)
Hemoglobin: 11.6 g/dL — ABNORMAL LOW (ref 12.0–15.0)
MCH: 28.5 pg (ref 26.0–34.0)
MCHC: 30.1 g/dL (ref 30.0–36.0)
MCV: 94.6 fL (ref 80.0–100.0)
Platelets: 205 10*3/uL (ref 150–400)
RBC: 4.07 MIL/uL (ref 3.87–5.11)
RDW: 14.6 % (ref 11.5–15.5)
WBC: 9.2 10*3/uL (ref 4.0–10.5)
nRBC: 0 % (ref 0.0–0.2)

## 2018-08-02 LAB — BASIC METABOLIC PANEL
Anion gap: 9 (ref 5–15)
BUN: 18 mg/dL (ref 8–23)
CHLORIDE: 103 mmol/L (ref 98–111)
CO2: 29 mmol/L (ref 22–32)
CREATININE: 1.14 mg/dL — AB (ref 0.44–1.00)
Calcium: 8.5 mg/dL — ABNORMAL LOW (ref 8.9–10.3)
GFR calc Af Amer: 55 mL/min — ABNORMAL LOW (ref >60)
GFR calc non Af Amer: 47 mL/min — ABNORMAL LOW (ref >60)
Glucose, Bld: 107 mg/dL — ABNORMAL HIGH (ref 70–99)
POTASSIUM: 3.9 mmol/L (ref 3.5–5.1)
SODIUM: 141 mmol/L (ref 135–145)

## 2018-08-02 LAB — TROPONIN I: Troponin I: <0.03 ng/mL (ref <0.03)

## 2018-08-02 MED ORDER — DILTIAZEM HCL 25 MG/5ML IV SOLN
5.0000 mg | Freq: Once | INTRAVENOUS | Status: AC
  Administered 2018-08-02: 5 mg via INTRAVENOUS
  Filled 2018-08-02: qty 5

## 2018-08-02 MED ORDER — DILTIAZEM HCL 60 MG PO TABS
30.0000 mg | ORAL_TABLET | Freq: Once | ORAL | Status: AC
  Administered 2018-08-02: 30 mg via ORAL
  Filled 2018-08-02: qty 1

## 2018-08-02 MED ORDER — PSEUDOEPHEDRINE HCL 30 MG PO TABS
30.0000 mg | ORAL_TABLET | Freq: Once | ORAL | Status: AC
  Administered 2018-08-02: 30 mg via ORAL
  Filled 2018-08-02: qty 1

## 2018-08-02 MED ORDER — GUAIFENESIN 100 MG/5ML PO SOLN
10.0000 mL | Freq: Once | ORAL | Status: AC
  Administered 2018-08-02: 200 mg via ORAL
  Filled 2018-08-02: qty 10

## 2018-08-02 NOTE — ED Notes (Signed)
Called lab about trop at this time. Stated it has to be added on. Provider made aware

## 2018-08-02 NOTE — ED Triage Notes (Signed)
Pt is here for SOB and feeling like her heart was racing. Pt was found by ems with HR of 150's and they attempted vagal maneuvers without success and then gave 6 adenosine followed by 12 adenosine without any effect per ems.  Pt reports intermittent Cp and sob with this.  No hx of rapid HR.  Pt appears sob on arrival, alert and oriented. Pt wearsO2 at home 4l [place].

## 2018-08-02 NOTE — ED Provider Notes (Signed)
[facility] Emergency Department Provider Note ____________________________________________   First MD Initiated Contact with Patient 08/02/18 1911     (approximate)  I have reviewed the triage vital signs and the nursing notes.   HISTORY  Chief Complaint Tachycardia    HPI [name] [name] is a 73 y.o. female with PMH as noted below who presents with palpitations, acute onset approximately 10 minutes prior to EMS arrival, common associated with chest tightness and shortness of breath.  The patient denies any prior history of very fast heart rate like this.  She denies chest pain, vomiting, or fever.  The patient was given adenosine by EMS apparently with no effect.  Past Medical History:  Diagnosis Date  . Asthma   . Chronic combined systolic (congestive) and diastolic (congestive) heart failure (HCC)    a. 02/2017 Echo: EF 25-30%, Gr2 DD.  Marland Kitchen. COPD (chronic obstructive pulmonary disease) (HCC)    a. On supplemental O2 @ home.  Marland Kitchen. Hypertension   . Lung nodule   . Narrow complex tachycardia (HCC)    a. 02/2017 - ? NSVT vs Afib-->placed on amio.  No OAC.  Marland Kitchen. NICM (nonischemic cardiomyopathy) (HCC)    a. 02/2017 Echo: Ef 25-30%, mild conc LVH, Gr2 DD, sev MR, mod dil LA;  b. 02/2016 Cath [facility]): nonobs dzs.  . Non-obstructive CAD    a. 02/2016 Cath [facility]): LM 30, LAD 20ost/p, 6136m, 30d, D1 50p, D2 30, D3 20, RI 20, LCX 40p, OM1 30, OM2 20, RCA 20 diffuse, RPL2 30, RPL3 30.  Marland Kitchen. Severe mitral regurgitation    a. 02/2017 Echo: Sev MR.    Patient Active Problem List   Diagnosis Date Noted  . Atrial fibrillation with RVR (HCC) 08/02/2018  . COPD exacerbation (HCC) 06/16/2018  . Severe mitral regurgitation 11/10/2017  . OA (osteoarthritis) of knee 11/10/2017  . CKD (chronic kidney disease) stage 3, GFR 30-59 ml/min (HCC) 08/20/2017  . Insomnia 07/22/2017  . Hyperlipidemia 07/01/2017  . DDD (degenerative disc disease), lumbar 04/30/2017  . B12 deficiency 04/30/2017    . Gait instability 03/31/2017  . Allergic rhinitis 03/31/2017  . Nausea 03/31/2017  . GERD (gastroesophageal reflux disease) 03/31/2017  . Chronic systolic heart failure (HCC) 03/24/2017  . HTN (hypertension) 03/24/2017  . COPD (chronic obstructive pulmonary disease) (HCC) 03/24/2017  . Chronic pain 03/24/2017  . MRSA carrier 03/18/2017  . History of ventricular tachycardia 03/18/2017  . Vitamin D deficiency 01/06/2017  . Chronic atrial fibrillation 06/15/2016  . Depression 06/15/2016  . Varicose veins of lower extremity with inflammation 09/07/2011    Past Surgical History:  Procedure Laterality Date  . REPLACEMENT TOTAL KNEE    . TYMPANOSTOMY TUBE PLACEMENT Bilateral 03/19/2017    Prior to Admission medications   Medication Sig Start Date End Date Taking? Authorizing Provider  acetaminophen (TYLENOL) 500 MG tablet Take 2 tablets (1,000 mg total) by mouth 3 (three) times daily. 08/02/17  Yes Plonk, Chrissie NoaWilliam, MD  albuterol (PROVENTIL HFA;VENTOLIN HFA) 108 (90 Base) MCG/ACT inhaler Inhale 2 puffs into the lungs every 6 (six) hours as needed for wheezing or shortness of breath. 12/07/17  Yes Duanne LimerickJones, Deanna C, MD  aspirin 81 MG chewable tablet Chew 81 mg by mouth daily.   Yes [provider]  atorvastatin (LIPITOR) 40 MG tablet Take 1 tablet (40 mg total) by mouth daily. 12/07/17  Yes Duanne LimerickJones, Deanna C, MD  budesonide-formoterol (SYMBICORT) 160-4.5 MCG/ACT inhaler Inhale 2 puffs into the lungs 2 (two) times daily. 12/07/17  Yes  Duanne Limerick, MD  carvedilol (COREG) 25 MG tablet Take 1 tablet (25 mg total) by mouth 2 (two) times daily. 12/07/17  Yes Duanne Limerick, MD  cholecalciferol (VITAMIN D3) 25 MCG (1000 UT) tablet Take 1,000 Units by mouth daily.   Yes [provider]  Cyanocobalamin (B-12) 1000 MCG TABS Take 1 tablet by mouth daily. 04/05/17  Yes Plonk, Chrissie Noa, MD  fluticasone (FLONASE) 50 MCG/ACT nasal spray Place 2 sprays into both nostrils daily. Patient taking  differently: Place 2 sprays into both nostrils 2 (two) times daily.  12/07/17  Yes Duanne Limerick, MD  gabapentin (NEURONTIN) 600 MG tablet Take 1,200 mg by mouth 2 (two) times daily. 04/28/18  Yes [provider]  guaiFENesin (MUCINEX) 600 MG 12 hr tablet Take 1 tablet (600 mg total) by mouth 2 (two) times daily. 06/19/18  Yes Salary, Montell D, MD  ipratropium-albuterol (DUONEB) 0.5-2.5 (3) MG/3ML SOLN Take 3 mLs by nebulization every 6 (six) hours as needed.   Yes [provider]  losartan (COZAAR) 25 MG tablet Take 1 tablet (25 mg total) by mouth daily. 12/07/17  Yes Duanne Limerick, MD  mirtazapine (REMERON) 30 MG tablet Take 30 mg by mouth at bedtime.  03/24/18  Yes [provider]  ondansetron (ZOFRAN ODT) 4 MG disintegrating tablet Take 1 tablet (4 mg total) by mouth every 8 (eight) hours as needed for nausea or vomiting. 06/25/18  Yes Minna Antis, MD  pantoprazole (PROTONIX) 40 MG tablet Take 40 mg by mouth 2 (two) times daily. 06/03/18  Yes [provider]  promethazine (PHENERGAN) 25 MG tablet Take 0.5 tablets (12.5 mg total) by mouth every 6 (six) hours as needed for nausea. 08/16/17  Yes Plonk, Chrissie Noa, MD  sertraline (ZOLOFT) 50 MG tablet Take 50 mg by mouth daily. 04/22/18  Yes [provider]  spironolactone (ALDACTONE) 25 MG tablet Take 1 tablet (25 mg total) by mouth daily. 12/07/17  Yes Duanne Limerick, MD  torsemide (DEMADEX) 20 MG tablet Take 20 mg by mouth daily. 04/22/18  Yes [provider]  loperamide (IMODIUM A-D) 2 MG tablet Take 1 tablet (2 mg total) by mouth 4 (four) times daily as needed for diarrhea or loose stools. Patient not taking: Reported on 08/02/2018 06/25/18   Minna Antis, MD  omeprazole (PRILOSEC) 40 MG capsule Take 1 capsule (40 mg total) by mouth daily. Patient not taking: Reported on 06/17/2018 12/07/17 12/07/18  Duanne Limerick, MD  OXYGEN Inhale 4 L into the lungs.     [provider]    predniSONE (DELTASONE) 50 MG tablet Take 1 tablet (50 mg total) by mouth daily with breakfast. 1 p.o. daily Patient not taking: Reported on 08/02/2018 06/19/18   Salary, Evelena Asa, MD  traMADol (ULTRAM) 50 MG tablet Take 50 mg by mouth 2 (two) times daily.  04/13/18   [provider]  traZODone (DESYREL) 50 MG tablet Take 1 tablet (50 mg total) by mouth at bedtime as needed for sleep. Patient not taking: Reported on 08/02/2018 12/13/17   Duanne Limerick, MD    Allergies Sulfa antibiotics; Cymbalta [duloxetine hcl]; and Tramadol hcl  Family History  Problem Relation Age of Onset  . Heart disease Mother   . Heart disease Sister   . Cancer Sister     Social History Social History   Tobacco Use  . Smoking status: Former Smoker    Packs/day: 3.00    Years: 55.00    Pack years: 165.00  Types: Cigarettes    Last attempt to quit: 2018    Years since quitting: 2.0  . Smokeless tobacco: Never Used  . Tobacco comment: smoking cessation materials not required  Substance Use Topics  . Alcohol use: No  . Drug use: No    Review of Systems  Constitutional: No fever. Eyes: No redness. ENT: No sore throat. Cardiovascular: Denies chest pain. Respiratory: Positive for shortness of breath. Gastrointestinal: No vomiting.  Genitourinary: Negative for flank pain.  Musculoskeletal: Negative for back pain. Skin: Negative for rash. Neurological: Negative for headache.   ____________________________________________   PHYSICAL EXAM:  VITAL SIGNS: ED Triage Vitals  Enc Vitals Group     BP 08/02/18 1910 (!) 109/95     Pulse Rate 08/02/18 1908 (!) 155     Resp 08/02/18 1908 (!) 27     Temp 08/02/18 1908 97.8 F (36.6 C)     Temp Source 08/02/18 1908 Oral     SpO2 08/02/18 1908 99 %     Weight 08/02/18 1911 238 lb (108 kg)     Height --      Head Circumference --      Peak Flow --      Pain Score 08/02/18 1910 10     Pain Loc --      Pain Edu? --      Excl. in GC? --      Constitutional: Alert and oriented.  Somewhat chronically ill-appearing but in no acute distress. Eyes: Conjunctivae are normal.  Head: Atraumatic. Nose: No congestion/rhinnorhea. Mouth/Throat: Mucous membranes are moist.   Neck: Normal range of motion.  Cardiovascular: Tachycardic, irregular rhythm. Grossly normal heart sounds.  Good peripheral circulation. Respiratory: Normal respiratory effort.  No retractions.  Decreased breath sounds bilaterally with no rales or rhonchi. Gastrointestinal: No distention.  Musculoskeletal: Trace bilateral lower extremity edema.  Extremities warm and well perfused.  Neurologic:  Normal speech and language. No gross focal neurologic deficits are appreciated.  Skin:  Skin is warm and dry. No rash noted. Psychiatric: Mood and affect are normal. Speech and behavior are normal.  ____________________________________________   LABS (all labs ordered are listed, but only abnormal results are displayed)  Labs Reviewed  BASIC METABOLIC PANEL - Abnormal; Notable for the following components:      Result Value   Glucose, Bld 107 (*)    Creatinine, Ser 1.14 (*)    Calcium 8.5 (*)    GFR calc non Af Amer 47 (*)    GFR calc Af Amer 55 (*)    All other components within normal limits  CBC - Abnormal; Notable for the following components:   Hemoglobin 11.6 (*)    All other components within normal limits  TROPONIN I   ____________________________________________  EKG  ED ECG REPORT I, Dionne BucySebastian Toluwanimi Radebaugh, the attending physician, personally viewed and interpreted this ECG.  Date: 08/02/2018 EKG Time: 1931 Rate: 99 Rhythm: normal sinus rhythm QRS Axis: normal Intervals: Prolonged QT ST/T Wave abnormalities: Nonspecific ST abnormality Narrative Interpretation: no evidence of acute ischemia  ____________________________________________  RADIOLOGY  CXR: Cardiomegaly, interstitial edema; stable from  prior  ____________________________________________   PROCEDURES  Procedure(s) performed: No  Procedures  Critical Care performed: Yes  CRITICAL CARE Performed by: Dionne BucySebastian Polette Nofsinger   Total critical care time: 20 minutes  Critical care time was exclusive of separately billable procedures and treating other patients.  Critical care was necessary to treat or prevent imminent or life-threatening deterioration.  Critical care was time spent personally by  me on the following activities: development of treatment plan with patient and/or surrogate as well as nursing, discussions with consultants, evaluation of patient's response to treatment, examination of patient, obtaining history from patient or surrogate, ordering and performing treatments and interventions, ordering and review of laboratory studies, ordering and review of radiographic studies, pulse oximetry and re-evaluation of patient's condition. ____________________________________________   INITIAL IMPRESSION / ASSESSMENT AND PLAN / ED COURSE  Pertinent labs & imaging results that were available during my care of the patient were reviewed by me and considered in my medical decision making (see chart for details).  73 year old female with PMH as noted above including history of CHF and COPD presents with palpitations, shortness of breath, chest tightness, and rapid heart rate acutely this evening.  The patient states that the symptoms were only present for around 10 minutes prior to EMS getting there.  She has no prior history of this happening.  On arrival the patient's heart rate was in the 150s and slightly irregular, with stable blood pressure, decreased breath sounds bilaterally, and normal mental status.  I reviewed the past medical records in Epic.  The patient was most recently seen in the ED about 1 month ago with GI symptoms.  She was admitted a week previously to that for COPD exacerbation.  I do not see a  documented history of atrial fibrillation or SVT.  An EKG was not captured with the patient's initial heart rate but on the monitor it was a narrow complex rhythm and appeared slightly irregular, consistent with atrial fibrillation.  Since it had not responded to adenosine I gave Cardizem IV and the patient's heart rate immediately went down to around 100.  I follow this with a p.o. dose of Cardizem.  She is now in sinus rhythm.  Is not totally clear whether the patient was in atrial fibrillation or SVT initially although I think atrial fibrillation is more likely.  We will obtain a chest x-ray to evaluate for fluid overload, lab work-up, and reassess.  ----------------------------------------- 11:06 PM on 08/02/2018 -----------------------------------------  The patient has remained stable in the ED although she still feels somewhat congested in her nose and somewhat short of breath.  She has had no further tachycardia.  However given that the rhythm appeared more consistent with atrial fibrillation rather than SVT, as well as the patient's age and comorbidities I think it would be safest to admit her overnight for observation to make sure she has no further runs of atrial fibrillation and arrange for any additional work-up that is necessary.  The patient agrees with this plan.  I signed the patient out to the hospitalist Dr. Anne Hahn at 11:05 PM. ____________________________________________   FINAL CLINICAL IMPRESSION(S) / ED DIAGNOSES  Final diagnoses:  Atrial fibrillation with RVR (HCC)      NEW MEDICATIONS STARTED DURING THIS VISIT:  New Prescriptions   No medications on file     Note:  This document was prepared using Dragon voice recognition software and may include unintentional dictation errors.    Dionne Bucy, MD 08/02/18 2356

## 2018-08-03 ENCOUNTER — Other Ambulatory Visit: Payer: Self-pay

## 2018-08-03 LAB — BASIC METABOLIC PANEL
Anion gap: 7 (ref 5–15)
BUN: 17 mg/dL (ref 8–23)
CO2: 30 mmol/L (ref 22–32)
Calcium: 8.5 mg/dL — ABNORMAL LOW (ref 8.9–10.3)
Chloride: 103 mmol/L (ref 98–111)
Creatinine, Ser: 1.07 mg/dL — ABNORMAL HIGH (ref 0.44–1.00)
GFR calc Af Amer: 59 mL/min — ABNORMAL LOW (ref >60)
GFR calc non Af Amer: 51 mL/min — ABNORMAL LOW (ref >60)
Glucose, Bld: 111 mg/dL — ABNORMAL HIGH (ref 70–99)
Potassium: 4.1 mmol/L (ref 3.5–5.1)
Sodium: 140 mmol/L (ref 135–145)

## 2018-08-03 LAB — CBC
HCT: 34.4 % — ABNORMAL LOW (ref 36.0–46.0)
Hemoglobin: 10.4 g/dL — ABNORMAL LOW (ref 12.0–15.0)
MCH: 28.3 pg (ref 26.0–34.0)
MCHC: 30.2 g/dL (ref 30.0–36.0)
MCV: 93.5 fL (ref 80.0–100.0)
Platelets: 178 10*3/uL (ref 150–400)
RBC: 3.68 MIL/uL — ABNORMAL LOW (ref 3.87–5.11)
RDW: 14.6 % (ref 11.5–15.5)
WBC: 8.4 10*3/uL (ref 4.0–10.5)
nRBC: 0 % (ref 0.0–0.2)

## 2018-08-03 MED ORDER — ASPIRIN 81 MG PO CHEW
81.0000 mg | CHEWABLE_TABLET | Freq: Every day | ORAL | Status: DC
  Administered 2018-08-03 – 2018-08-04 (×2): 81 mg via ORAL
  Filled 2018-08-03 (×2): qty 1

## 2018-08-03 MED ORDER — ONDANSETRON HCL 4 MG PO TABS
4.0000 mg | ORAL_TABLET | Freq: Four times a day (QID) | ORAL | Status: DC | PRN
  Administered 2018-08-03 – 2018-08-04 (×2): 4 mg via ORAL
  Filled 2018-08-03 (×2): qty 1

## 2018-08-03 MED ORDER — GUAIFENESIN-DM 100-10 MG/5ML PO SYRP
5.0000 mL | ORAL_SOLUTION | ORAL | Status: DC | PRN
  Administered 2018-08-03 (×3): 5 mL via ORAL
  Filled 2018-08-03 (×3): qty 5

## 2018-08-03 MED ORDER — CARVEDILOL 25 MG PO TABS
25.0000 mg | ORAL_TABLET | Freq: Two times a day (BID) | ORAL | Status: DC
  Administered 2018-08-03 – 2018-08-04 (×3): 25 mg via ORAL
  Filled 2018-08-03 (×3): qty 1

## 2018-08-03 MED ORDER — SERTRALINE HCL 50 MG PO TABS
50.0000 mg | ORAL_TABLET | Freq: Every day | ORAL | Status: DC
  Administered 2018-08-03 – 2018-08-04 (×2): 50 mg via ORAL
  Filled 2018-08-03 (×2): qty 1

## 2018-08-03 MED ORDER — ACETAMINOPHEN 325 MG PO TABS: 650.0000 mg | ORAL_TABLET | Freq: Four times a day (QID) | ORAL | Status: DC | PRN

## 2018-08-03 MED ORDER — OXYCODONE HCL 5 MG PO TABS
5.0000 mg | ORAL_TABLET | Freq: Four times a day (QID) | ORAL | Status: DC | PRN
  Administered 2018-08-03 – 2018-08-04 (×5): 5 mg via ORAL
  Filled 2018-08-03 (×5): qty 1

## 2018-08-03 MED ORDER — ATORVASTATIN CALCIUM 20 MG PO TABS
40.0000 mg | ORAL_TABLET | Freq: Every day | ORAL | Status: DC
  Administered 2018-08-03 – 2018-08-04 (×2): 40 mg via ORAL
  Filled 2018-08-03 (×2): qty 2

## 2018-08-03 MED ORDER — LOSARTAN POTASSIUM 50 MG PO TABS
25.0000 mg | ORAL_TABLET | Freq: Every day | ORAL | Status: DC
  Administered 2018-08-03 – 2018-08-04 (×2): 25 mg via ORAL
  Filled 2018-08-03 (×2): qty 1

## 2018-08-03 MED ORDER — ONDANSETRON HCL 4 MG/2ML IJ SOLN
4.0000 mg | Freq: Four times a day (QID) | INTRAMUSCULAR | Status: DC | PRN
  Administered 2018-08-03: 4 mg via INTRAVENOUS
  Filled 2018-08-03: qty 2

## 2018-08-03 MED ORDER — LEVALBUTEROL HCL 1.25 MG/0.5ML IN NEBU
1.2500 mg | INHALATION_SOLUTION | Freq: Four times a day (QID) | RESPIRATORY_TRACT | Status: DC | PRN
  Filled 2018-08-03: qty 0.5

## 2018-08-03 MED ORDER — MOMETASONE FURO-FORMOTEROL FUM 200-5 MCG/ACT IN AERO
2.0000 | INHALATION_SPRAY | Freq: Two times a day (BID) | RESPIRATORY_TRACT | Status: DC
  Administered 2018-08-03 – 2018-08-04 (×3): 2 via RESPIRATORY_TRACT
  Filled 2018-08-03: qty 8.8

## 2018-08-03 MED ORDER — PREDNISONE 20 MG PO TABS
40.0000 mg | ORAL_TABLET | Freq: Every day | ORAL | Status: DC
  Administered 2018-08-03 – 2018-08-04 (×2): 40 mg via ORAL
  Filled 2018-08-03 (×2): qty 2

## 2018-08-03 MED ORDER — SPIRONOLACTONE 25 MG PO TABS
25.0000 mg | ORAL_TABLET | Freq: Every day | ORAL | Status: DC
  Administered 2018-08-03 – 2018-08-04 (×2): 25 mg via ORAL
  Filled 2018-08-03 (×2): qty 1

## 2018-08-03 MED ORDER — PANTOPRAZOLE SODIUM 40 MG PO TBEC
40.0000 mg | DELAYED_RELEASE_TABLET | Freq: Two times a day (BID) | ORAL | Status: DC
  Administered 2018-08-03 – 2018-08-04 (×4): 40 mg via ORAL
  Filled 2018-08-03 (×4): qty 1

## 2018-08-03 MED ORDER — ENOXAPARIN SODIUM 40 MG/0.4ML ~~LOC~~ SOLN
40.0000 mg | SUBCUTANEOUS | Status: DC
  Administered 2018-08-03: 40 mg via SUBCUTANEOUS
  Filled 2018-08-03: qty 0.4

## 2018-08-03 MED ORDER — MIRTAZAPINE 15 MG PO TABS
30.0000 mg | ORAL_TABLET | Freq: Every day | ORAL | Status: DC
  Administered 2018-08-03 (×2): 30 mg via ORAL
  Filled 2018-08-03 (×2): qty 2

## 2018-08-03 MED ORDER — BENZONATATE 100 MG PO CAPS: 200.0000 mg | ORAL_CAPSULE | Freq: Three times a day (TID) | ORAL | Status: DC | PRN

## 2018-08-03 MED ORDER — GABAPENTIN 600 MG PO TABS
1200.0000 mg | ORAL_TABLET | Freq: Two times a day (BID) | ORAL | Status: DC
  Administered 2018-08-03 – 2018-08-04 (×3): 1200 mg via ORAL
  Filled 2018-08-03 (×4): qty 2

## 2018-08-03 MED ORDER — SODIUM CHLORIDE 0.9% FLUSH
10.0000 mL | Freq: Two times a day (BID) | INTRAVENOUS | Status: DC
  Administered 2018-08-03 – 2018-08-04 (×2): 10 mL via INTRAVENOUS

## 2018-08-03 MED ORDER — ACETAMINOPHEN 650 MG RE SUPP: 650.0000 mg | Freq: Four times a day (QID) | RECTAL | Status: DC | PRN

## 2018-08-03 MED ORDER — TORSEMIDE 20 MG PO TABS
20.0000 mg | ORAL_TABLET | Freq: Every day | ORAL | Status: DC
  Administered 2018-08-03 – 2018-08-04 (×2): 20 mg via ORAL
  Filled 2018-08-03 (×2): qty 1

## 2018-08-03 MED ORDER — AZITHROMYCIN 250 MG PO TABS
500.0000 mg | ORAL_TABLET | Freq: Every day | ORAL | Status: DC
  Administered 2018-08-03 – 2018-08-04 (×2): 500 mg via ORAL
  Filled 2018-08-03 (×2): qty 2

## 2018-08-03 NOTE — Plan of Care (Addendum)
Patient describes chronic pain 10/10 after pain medication administration. Patient ambulates to the bathroom without oxygen without distress. Patient has complaints of nausea and dizziness. Patient is eating fried food from [employer].

## 2018-08-03 NOTE — ED Notes (Signed)
Report was called and given at this time.  

## 2018-08-03 NOTE — H&P (Signed)
[facility] - [place] at [facility]   PATIENT NAME: [name] [name]    MR#:  [id number]  DATE OF BIRTH:  1946-06-13  DATE OF ADMISSION:  08/02/2018  PRIMARY CARE PHYSICIAN: Barbette Reichmann, MD   REQUESTING/REFERRING PHYSICIAN: Marisa Severin, MD  CHIEF COMPLAINT:   Chief Complaint  Patient presents with  . Tachycardia    HISTORY OF PRESENT ILLNESS:  [name] [name]  is a 73 y.o. female who presents with chief complaint as above.  Patient presents to the ED with a complaint of chest discomfort and palpitations.  She is found by EMS to be tachycardic but is felt to be A. fib with RVR.  On arrival here in the ED she was given 2 doses of Cardizem and her rate controlled and her rhythm corrected.  Patient states that she has been coughing for the past couple weeks, and wheezing.  She has been using a lot of nebulizer treatments at home.  She feels like she is having exacerbation of her COPD.  Hospitalist were called for admission and treatment  PAST MEDICAL HISTORY:   Past Medical History:  Diagnosis Date  . Asthma   . Chronic combined systolic (congestive) and diastolic (congestive) heart failure (HCC)    a. 02/2017 Echo: EF 25-30%, Gr2 DD.  Marland Kitchen COPD (chronic obstructive pulmonary disease) (HCC)    a. On supplemental O2 @ home.  Marland Kitchen Hypertension   . Lung nodule   . Narrow complex tachycardia (HCC)    a. 02/2017 - ? NSVT vs Afib-->placed on amio.  No OAC.  Marland Kitchen NICM (nonischemic cardiomyopathy) (HCC)    a. 02/2017 Echo: Ef 25-30%, mild conc LVH, Gr2 DD, sev MR, mod dil LA;  b. 02/2016 Cath [facility]): nonobs dzs.  . Non-obstructive CAD    a. 02/2016 Cath [facility]): LM 30, LAD 20ost/p, 34m, 30d, D1 50p, D2 30, D3 20, RI 20, LCX 40p, OM1 30, OM2 20, RCA 20 diffuse, RPL2 30, RPL3 30.  Marland Kitchen Severe mitral regurgitation    a. 02/2017 Echo: Sev MR.     PAST SURGICAL HISTORY:   Past Surgical History:  Procedure Laterality Date  . REPLACEMENT TOTAL KNEE    . TYMPANOSTOMY TUBE PLACEMENT Bilateral  03/19/2017     SOCIAL HISTORY:   Social History   Tobacco Use  . Smoking status: Former Smoker    Packs/day: 3.00    Years: 55.00    Pack years: 165.00    Types: Cigarettes    Last attempt to quit: 2018    Years since quitting: 2.0  . Smokeless tobacco: Never Used  . Tobacco comment: smoking cessation materials not required  Substance Use Topics  . Alcohol use: No     FAMILY HISTORY:   Family History  Problem Relation Age of Onset  . Heart disease Mother   . Heart disease Sister   . Cancer Sister      DRUG ALLERGIES:   Allergies  Allergen Reactions  . Sulfa Antibiotics Itching    Pt states she gets "itching inside and out"  . Cymbalta [Duloxetine Hcl] Nausea Only  . Tramadol Hcl Other (See Comments)    Falls, confusion    MEDICATIONS AT HOME:   Prior to Admission medications   Medication Sig Start Date End Date Taking? Authorizing Provider  acetaminophen (TYLENOL) 500 MG tablet Take 2 tablets (1,000 mg total) by mouth 3 (three) times daily. 08/02/17  Yes Plonk, Chrissie Noa, MD  albuterol (PROVENTIL HFA;VENTOLIN HFA) 108 (90 Base) MCG/ACT inhaler Inhale 2  puffs into the lungs every 6 (six) hours as needed for wheezing or shortness of breath. 12/07/17  Yes Duanne Limerick, MD  aspirin 81 MG chewable tablet Chew 81 mg by mouth daily.   Yes [provider]  atorvastatin (LIPITOR) 40 MG tablet Take 1 tablet (40 mg total) by mouth daily. 12/07/17  Yes Duanne Limerick, MD  budesonide-formoterol (SYMBICORT) 160-4.5 MCG/ACT inhaler Inhale 2 puffs into the lungs 2 (two) times daily. 12/07/17  Yes Duanne Limerick, MD  carvedilol (COREG) 25 MG tablet Take 1 tablet (25 mg total) by mouth 2 (two) times daily. 12/07/17  Yes Duanne Limerick, MD  cholecalciferol (VITAMIN D3) 25 MCG (1000 UT) tablet Take 1,000 Units by mouth daily.   Yes [provider]  Cyanocobalamin (B-12) 1000 MCG TABS Take 1 tablet by mouth daily. 04/05/17  Yes Plonk, Chrissie Noa, MD  fluticasone  (FLONASE) 50 MCG/ACT nasal spray Place 2 sprays into both nostrils daily. Patient taking differently: Place 2 sprays into both nostrils 2 (two) times daily.  12/07/17  Yes Duanne Limerick, MD  gabapentin (NEURONTIN) 600 MG tablet Take 1,200 mg by mouth 2 (two) times daily. 04/28/18  Yes [provider]  guaiFENesin (MUCINEX) 600 MG 12 hr tablet Take 1 tablet (600 mg total) by mouth 2 (two) times daily. 06/19/18  Yes Salary, Montell D, MD  ipratropium-albuterol (DUONEB) 0.5-2.5 (3) MG/3ML SOLN Take 3 mLs by nebulization every 6 (six) hours as needed.   Yes [provider]  losartan (COZAAR) 25 MG tablet Take 1 tablet (25 mg total) by mouth daily. 12/07/17  Yes Duanne Limerick, MD  mirtazapine (REMERON) 30 MG tablet Take 30 mg by mouth at bedtime.  03/24/18  Yes [provider]  ondansetron (ZOFRAN ODT) 4 MG disintegrating tablet Take 1 tablet (4 mg total) by mouth every 8 (eight) hours as needed for nausea or vomiting. 06/25/18  Yes Minna Antis, MD  pantoprazole (PROTONIX) 40 MG tablet Take 40 mg by mouth 2 (two) times daily. 06/03/18  Yes [provider]  promethazine (PHENERGAN) 25 MG tablet Take 0.5 tablets (12.5 mg total) by mouth every 6 (six) hours as needed for nausea. 08/16/17  Yes Plonk, Chrissie Noa, MD  sertraline (ZOLOFT) 50 MG tablet Take 50 mg by mouth daily. 04/22/18  Yes [provider]  spironolactone (ALDACTONE) 25 MG tablet Take 1 tablet (25 mg total) by mouth daily. 12/07/17  Yes Duanne Limerick, MD  torsemide (DEMADEX) 20 MG tablet Take 20 mg by mouth daily. 04/22/18  Yes [provider]  loperamide (IMODIUM A-D) 2 MG tablet Take 1 tablet (2 mg total) by mouth 4 (four) times daily as needed for diarrhea or loose stools. Patient not taking: Reported on 08/02/2018 06/25/18   Minna Antis, MD  omeprazole (PRILOSEC) 40 MG capsule Take 1 capsule (40 mg total) by mouth daily. Patient not taking: Reported on 06/17/2018 12/07/17 12/07/18   Duanne Limerick, MD  OXYGEN Inhale 4 L into the lungs.     [provider]  predniSONE (DELTASONE) 50 MG tablet Take 1 tablet (50 mg total) by mouth daily with breakfast. 1 p.o. daily Patient not taking: Reported on 08/02/2018 06/19/18   Salary, Evelena Asa, MD  traMADol (ULTRAM) 50 MG tablet Take 50 mg by mouth 2 (two) times daily.  04/13/18   [provider]  traZODone (DESYREL) 50 MG tablet Take 1 tablet (50 mg total) by mouth at bedtime as needed for sleep. Patient not taking: Reported  on 08/02/2018 12/13/17   Duanne LimerickJones, Deanna C, MD    REVIEW OF SYSTEMS:  Review of Systems  Constitutional: Negative for chills, fever, malaise/fatigue and weight loss.  HENT: Negative for ear pain, hearing loss and tinnitus.   Eyes: Negative for blurred vision, double vision, pain and redness.  Respiratory: Positive for cough, shortness of breath and wheezing. Negative for hemoptysis.   Cardiovascular: Positive for chest pain and palpitations. Negative for orthopnea and leg swelling.  Gastrointestinal: Negative for abdominal pain, constipation, diarrhea, nausea and vomiting.  Genitourinary: Negative for dysuria, frequency and hematuria.  Musculoskeletal: Negative for back pain, joint pain and neck pain.  Skin:       No acne, rash, or lesions  Neurological: Negative for dizziness, tremors, focal weakness and weakness.  Endo/Heme/Allergies: Negative for polydipsia. Does not bruise/bleed easily.  Psychiatric/Behavioral: Negative for depression. The patient is not nervous/anxious and does not have insomnia.      VITAL SIGNS:   Vitals:   08/02/18 1945 08/02/18 2000 08/02/18 2115 08/02/18 2205  BP:  116/73  131/63  Pulse:  (!) 104 84 60  Resp: (!) 25 19 19  (!) 27  Temp:      TempSrc:      SpO2:  95% 99% 98%  Weight:       Wt Readings from Last 3 Encounters:  08/02/18 108 kg  06/25/18 105.2 kg  06/16/18 105.2 kg    PHYSICAL EXAMINATION:  Physical Exam  Vitals reviewed. Constitutional:  She is oriented to person, place, and time. She appears well-developed and well-nourished. No distress.  HENT:  Head: Normocephalic and atraumatic.  Mouth/Throat: Oropharynx is clear and moist.  Eyes: Pupils are equal, round, and reactive to light. Conjunctivae and EOM are normal. No scleral icterus.  Neck: Normal range of motion. Neck supple. No JVD present. No thyromegaly present.  Cardiovascular: Regular rhythm and intact distal pulses. Exam reveals no gallop and no friction rub.  No murmur heard. Borderline tachycardic  Respiratory: Effort normal. No respiratory distress. She has wheezes. She has no rales.  GI: Soft. Bowel sounds are normal. She exhibits no distension. There is no abdominal tenderness.  Musculoskeletal: Normal range of motion.        General: No edema.     Comments: No arthritis, no gout  Lymphadenopathy:    She has no cervical adenopathy.  Neurological: She is alert and oriented to person, place, and time. No cranial nerve deficit.  No dysarthria, no aphasia  Skin: Skin is warm and dry. No rash noted. No erythema.  Psychiatric: She has a normal mood and affect. Her behavior is normal. Judgment and thought content normal.    LABORATORY PANEL:   CBC Recent Labs  Lab 08/02/18 1916  WBC 9.2  HGB 11.6*  HCT 38.5  PLT 205   ------------------------------------------------------------------------------------------------------------------  Chemistries  Recent Labs  Lab 08/02/18 1916  NA 141  K 3.9  CL 103  CO2 29  GLUCOSE 107*  BUN 18  CREATININE 1.14*  CALCIUM 8.5*   ------------------------------------------------------------------------------------------------------------------  Cardiac Enzymes Recent Labs  Lab 08/02/18 1916  TROPONINI <0.03   ------------------------------------------------------------------------------------------------------------------  RADIOLOGY:  Dg Chest 2 View  Result Date: 08/02/2018 CLINICAL DATA:  Chest pain,  shortness of breath, and tachycardia. EXAM: CHEST - 2 VIEW COMPARISON:  Chest x-ray dated June 16, 2018. FINDINGS: Stable cardiomegaly and mild interstitial pulmonary edema. Bibasilar atelectasis. No focal consolidation, pleural effusion, or pneumothorax. No acute osseous abnormality. Prior right mid clavicle ORIF. IMPRESSION: Stable cardiomegaly and mild interstitial  pulmonary edema. Electronically Signed   By: Obie Dredge M.D.   On: 08/02/2018 19:52    EKG:   Orders placed or performed during the hospital encounter of 08/02/18  . ED EKG within 10 minutes  . ED EKG within 10 minutes  . EKG 12-Lead  . EKG 12-Lead    IMPRESSION AND PLAN:  Principal Problem:   COPD exacerbation (HCC) -we will start the patient on prednisone, azithromycin, continue home inhalers, use adjunctive nebulizer treatments when needed, as well as PRN antitussive Active Problems:   Atrial fibrillation with RVR (HCC) -patient's rate is controlled and rhythm is currently sinus, patient states she has no prior diagnosis of A. fib with RVR, though per chart review she was treated by cardiology for heart failure.  At some point in the past she had physician visits which documented chronic A. fib for which she was on amiodarone, though this was stopped.  Patient remains in sinus rhythm and rate controlled she could likely follow-up with her cardiologist in outpatient setting, if her A. fib recurs cardiology consult could be considered   Chronic systolic heart failure (HCC) -continue home meds   HTN (hypertension) -home dose antihypertensives   GERD (gastroesophageal reflux disease) -home dose PPI   Hyperlipidemia -Home dose antilipid   CKD (chronic kidney disease) stage 3, GFR 30-59 ml/min (HCC) -at baseline, avoid nephrotoxins and monitor  Chart review performed and case discussed with ED provider. Labs, imaging and/or ECG reviewed by provider and discussed with patient/family. Management plans discussed with the  patient and/or family.  DVT PROPHYLAXIS: SubQ lovenox   GI PROPHYLAXIS:  PPI   ADMISSION STATUS: Observation  CODE STATUS: Full Code Status History    Date Active Date Inactive Code Status Order ID Comments User Context   06/16/2018 2214 06/19/2018 1855 Full Code [id number]  Ihor Austin, MD ED   05/29/2018 0208 05/29/2018 1534 Full Code [id number]  Oralia Manis, MD ED   03/13/2017 1205 03/18/2017 1809 Full Code [id number]  Gracelyn Nurse, MD Inpatient   06/03/2016 1045 06/05/2016 1630 Full Code [id number]  Shaune Pollack, MD Inpatient      TOTAL TIME TAKING CARE OF THIS PATIENT: 40 minutes.   Torre Pikus FIELDING 08/03/2018, 12:21 AM  [facility]  Office  [phone]  CC: Primary care physician; Barbette Reichmann, MD  Note:  This document was prepared using Dragon voice recognition software and may include unintentional dictation errors.

## 2018-08-03 NOTE — Plan of Care (Signed)
Patient admitted at 0100. Patient profile completed. Patient is complaining of generalized pain, notified Dr. Anne Hahn. Patient is unsteady on her feet. IV was inoperable, attempted 3 iv sticks but was unsuccessful. Patient requested for oxygen to be adjusted to 3 liters.

## 2018-08-03 NOTE — Care Management Obs Status (Signed)
MEDICARE OBSERVATION STATUS NOTIFICATION   Patient Details  Name: [name] [name] MRN: [id number] Date of Birth: 29-Sep-1945   Medicare Observation Status Notification Given:  Yes    Sherren Kerns, RN 08/03/2018, 2:11 PM

## 2018-08-03 NOTE — Progress Notes (Signed)
Principal Problem:   COPD exacerbation (HCC) -we will start the patient on prednisone, azithromycin, continue home inhalers, use adjunctive nebulizer treatments when needed, as well as PRN antitussive Active Problems:   Atrial fibrillation with RVR (HCC) -patient's rate is controlled and rhythm is currently sinus, patient states she has no prior diagnosis of A. fib with RVR, though per chart review she was treated by cardiology for heart failure.  At some point in the past she had physician visits which documented chronic A. fib for which she was on amiodarone, though this was stopped.  Patient remains in sinus rhythm and rate controlled she could likely follow-up with her cardiologist in outpatient setting, if her A. fib recurs cardiology consult could be considered   Chronic systolic heart failure (HCC) -continue home meds   HTN (hypertension) -home dose antihypertensives   GERD (gastroesophageal reflux disease) -home dose PPI   Hyperlipidemia -Home dose antilipid   CKD (chronic kidney disease) stage 3, GFR 30-59 ml/min (HCC) -at baseline, avoid nephrotoxins and monitor  Agree with above

## 2018-08-04 MED ORDER — BENZONATATE 200 MG PO CAPS: 200.0000 mg | ORAL_CAPSULE | Freq: Three times a day (TID) | ORAL | 0 refills | Status: AC | PRN

## 2018-08-04 MED ORDER — AZITHROMYCIN 250 MG PO TABS: ORAL_TABLET | ORAL | 0 refills | Status: DC

## 2018-08-04 MED ORDER — PREDNISONE 20 MG PO TABS: 40.0000 mg | ORAL_TABLET | Freq: Every day | ORAL | 0 refills | Status: AC

## 2018-08-04 NOTE — Progress Notes (Signed)
Lance Muss to be D/C'd Home per MD order. Patient given discharge teaching and paperwork regarding medications, diet, follow-up appointments and activity. Patient understanding verbalized. No questions or complaints at this time. Skin condition as charted. IV and telemetry removed prior to leaving.  No further needs by Care Management/Social Work. Prescriptions given to patient.  An After Visit Summary was printed and given to the patient.   Patient escorted via wheelchair by nurse tech to ride home with family.  Clayborne Dana

## 2018-08-04 NOTE — Evaluation (Signed)
Physical Therapy Evaluation Patient Details Name: [name] [name] MRN: [id number] DOB: [birth year] Today's Date: 08/04/2018   History of Present Illness  73 y/o female her with COPD exacerbation.  She arrived complaining of chest discomfort and palpitations.  She is found by EMS to be tachycardic but is felt to be A. fib with RVR.  On arrival here in the ED she was given 2 doses of Cardizem and her rate controlled and her rhythm corrected.  Patient states that she has been coughing for the past couple weeks, and wheezing.  Clinical Impression  Pt did relatively well with PT exam, though she was not willing to do much more than mobility, strength testing and and very limited in-room ambulation w/o AD.  She states she uses a rollator at home and refuses to use FWW (because it will "make me fall").  She reports she feels well enough to go home but is still a little weaker than her normal, she was currently getting HHPT and PT agrees that continuing HHPT is beneficial once discharged.  Overall safe for minimal in-home activity.2   Follow Up Recommendations Home health PT    Equipment Recommendations  None recommended by PT    Recommendations for Other Services       Precautions / Restrictions Precautions Precautions: Fall Restrictions Weight Bearing Restrictions: No      Mobility  Bed Mobility Overal bed mobility: Modified Independent             General bed mobility comments: Pt able to rise to sitting EOB w/o assist  Transfers Overall transfer level: Modified independent Equipment used: None             General transfer comment: Pt able to rise and maintain balance with good confidence  Ambulation/Gait Ambulation/Gait assistance: Supervision Gait Distance (Feet): 8 Feet Assistive device: None       General Gait Details: Pt refused to use FWW and reports that she is not going to go to far, "definitely not out of the room."  She was confident with limited in-room  ambulation, did not have significant fatigue and generally showed good safety despite unwillingness to do a whole   Stairs            Wheelchair Mobility    Modified Rankin (Stroke Patients Only)       Balance Overall balance assessment: Modified Independent                                           Pertinent Vitals/Pain Pain Assessment: (chronic general pain, reports R hip pain that is newer)    Home Living Family/patient expects to be discharged to:: Private residence Living Arrangements: Children     Home Access: Stairs to enter Entrance Stairs-Rails: Left Entrance Stairs-Number of Steps: 4 Home Layout: One level Home Equipment: Walker - 4 wheels      Prior Function Level of Independence: Independent         Comments: Pt reports she is rarely out of the home (uses w/c if she is) but is able to manage in the home with 4WW or at times no AD     Hand Dominance        Extremity/Trunk Assessment   Upper Extremity Assessment Upper Extremity Assessment: Generalized weakness;Overall [facility] for tasks assessed    Lower Extremity Assessment Lower Extremity Assessment: Generalized weakness;Overall [facility] for tasks assessed  Communication   Communication: No difficulties  Cognition Arousal/Alertness: Awake/alert Behavior During Therapy: Restless Overall Cognitive Status: Within Functional Limits for tasks assessed                                        General Comments      Exercises     Assessment/Plan    PT Assessment Patient needs continued PT services  PT Problem List Decreased strength;Decreased activity tolerance;Decreased knowledge of use of DME;Decreased safety awareness;Cardiopulmonary status limiting activity;Pain       PT Treatment Interventions DME instruction;Gait training;Stair training;Functional mobility training;Therapeutic activities;Therapeutic exercise;Balance training;Neuromuscular  re-education;Patient/family education    PT Goals (Current goals can be found in the Care Plan section)  Acute Rehab PT Goals Patient Stated Goal: go home this afternoon PT Goal Formulation: With patient Time For Goal Achievement: 08/18/18 Potential to Achieve Goals: Good    Frequency Min 2X/week   Barriers to discharge        Co-evaluation               AM-PAC PT "6 Clicks" Mobility  Outcome Measure Help needed turning from your back to your side while in a flat bed without using bedrails?: None Help needed moving from lying on your back to sitting on the side of a flat bed without using bedrails?: None Help needed moving to and from a bed to a chair (including a wheelchair)?: A Little Help needed standing up from a chair using your arms (e.g., wheelchair or bedside chair)?: None Help needed to walk in hospital room?: A Little Help needed climbing 3-5 steps with a railing? : A Lot 6 Click Score: 20    End of Session Equipment Utilized During Treatment: Gait belt;Oxygen(2.5 liters (sats in high 90s), reports she is on chronic 4L) Activity Tolerance: Patient tolerated treatment well Patient left: with chair alarm set;with call bell/phone within reach Nurse Communication: Mobility status PT Visit Diagnosis: Muscle weakness (generalized) (M62.81);Difficulty in walking, not elsewhere classified (R26.2)    Time: 6060-0459 PT Time Calculation (min) (ACUTE ONLY): 18 min   Charges:   PT Evaluation $PT Eval Low Complexity: 1 Low          Malachi Pro, DPT 08/04/2018, 10:39 AM

## 2018-08-04 NOTE — Care Management Note (Signed)
Case Management Note  Patient Details  Name: [name] [name] MRN: [id number] Date of Birth: 04-10-1946  Subjective/Objective:       Patient is discharging today with resumption of home health services through [facility].  Barbara Cower with [facility] is aware.               Action/Plan:   Expected Discharge Date:  08/04/18               Expected Discharge Plan:  Home w Home Health Services  In-House Referral:     Discharge planning Services  CM Consult  Post Acute Care Choice:  Home Health, Resumption of Svcs/PTA Provider Choice offered to:  Patient  DME Arranged:    DME Agency:     HH Arranged:  RN, PT HH Agency:  [facility]  Status of Service:  Completed, signed off  If discussed at Long Length of Stay Meetings, dates discussed:    Additional Comments:  Sherren Kerns, RN 08/04/2018, 12:22 PM

## 2018-08-04 NOTE — Discharge Summary (Signed)
[facility] [place] at [facility]   PATIENT NAME: [name] [name]    MR#:  [id number]  DATE OF BIRTH:  03-24-1946  DATE OF ADMISSION:  08/02/2018 ADMITTING PHYSICIAN: Oralia Manis, MD  DATE OF DISCHARGE: No discharge date for patient encounter.  PRIMARY CARE PHYSICIAN: Barbette Reichmann, MD    ADMISSION DIAGNOSIS:  Atrial fibrillation with RVR (HCC) [I48.91]  DISCHARGE DIAGNOSIS:  Principal Problem:   COPD exacerbation (HCC) Active Problems:   Chronic systolic heart failure (HCC)   HTN (hypertension)   GERD (gastroesophageal reflux disease)   Hyperlipidemia   CKD (chronic kidney disease) stage 3, GFR 30-59 ml/min (HCC)   Atrial fibrillation with RVR (HCC)   SECONDARY DIAGNOSIS:   Past Medical History:  Diagnosis Date  . Asthma   . Chronic combined systolic (congestive) and diastolic (congestive) heart failure (HCC)    a. 02/2017 Echo: EF 25-30%, Gr2 DD.  Marland Kitchen COPD (chronic obstructive pulmonary disease) (HCC)    a. On supplemental O2 @ home.  Marland Kitchen Hypertension   . Lung nodule   . Narrow complex tachycardia (HCC)    a. 02/2017 - ? NSVT vs Afib-->placed on amio.  No OAC.  Marland Kitchen NICM (nonischemic cardiomyopathy) (HCC)    a. 02/2017 Echo: Ef 25-30%, mild conc LVH, Gr2 DD, sev MR, mod dil LA;  b. 02/2016 Cath [facility]): nonobs dzs.  . Non-obstructive CAD    a. 02/2016 Cath [facility]): LM 30, LAD 20ost/p, 38m, 30d, D1 50p, D2 30, D3 20, RI 20, LCX 40p, OM1 30, OM2 20, RCA 20 diffuse, RPL2 30, RPL3 30.  Marland Kitchen Severe mitral regurgitation    a. 02/2017 Echo: Sev MR.    HOSPITAL COURSE:  *acute on COPD exacerbation Resolved Continue prednisone taper, empiric azithromycin, breathing treatments PRN, and continue close medical monitoring   *Chronic hx of atrial fibrillation rate controlled, currently in sinus, previously on amiodarone-was discontinued   *Chronic systolic heart failure without exacerbation  Continue home regiment   *Chronic hypertension Stable on current  regiment  *Chronic GERD  PPI daily   *Hyperlipidemia  Home dose antilipid  *CKD stage 3, GFR 30-59 ml/min (HCC)  at baseline avoided nephrotoxins  DISCHARGE CONDITIONS:  stable  CONSULTS OBTAINED:    DRUG ALLERGIES:   Allergies  Allergen Reactions  . Sulfa Antibiotics Itching    Pt states she gets "itching inside and out"  . Cymbalta [Duloxetine Hcl] Nausea Only  . Tramadol Hcl Other (See Comments)    Falls, confusion    DISCHARGE MEDICATIONS:   Allergies as of 08/04/2018      Reactions   Sulfa Antibiotics Itching   Pt states she gets "itching inside and out"   Cymbalta [duloxetine Hcl] Nausea Only   Tramadol Hcl Other (See Comments)   Falls, confusion      Medication List    TAKE these medications   acetaminophen 500 MG tablet Commonly known as:  TYLENOL Take 2 tablets (1,000 mg total) by mouth 3 (three) times daily.   albuterol 108 (90 Base) MCG/ACT inhaler Commonly known as:  PROVENTIL HFA;VENTOLIN HFA Inhale 2 puffs into the lungs every 6 (six) hours as needed for wheezing or shortness of breath.   aspirin 81 MG chewable tablet Chew 81 mg by mouth daily.   atorvastatin 40 MG tablet Commonly known as:  LIPITOR Take 1 tablet (40 mg total) by mouth daily.   azithromycin 250 MG tablet Commonly known as:  ZITHROMAX 1 po daily Start taking on:  August 05, 2018  B-12 1000 MCG Tabs Take 1 tablet by mouth daily.   benzonatate 200 MG capsule Commonly known as:  TESSALON Take 1 capsule (200 mg total) by mouth 3 (three) times daily as needed for cough.   budesonide-formoterol 160-4.5 MCG/ACT inhaler Commonly known as:  SYMBICORT Inhale 2 puffs into the lungs 2 (two) times daily.   carvedilol 25 MG tablet Commonly known as:  COREG Take 1 tablet (25 mg total) by mouth 2 (two) times daily.   cholecalciferol 25 MCG (1000 UT) tablet Commonly known as:  VITAMIN D3 Take 1,000 Units by mouth daily.   fluticasone 50 MCG/ACT nasal spray Commonly known  as:  FLONASE Place 2 sprays into both nostrils daily. What changed:  when to take this   gabapentin 600 MG tablet Commonly known as:  NEURONTIN Take 1,200 mg by mouth 2 (two) times daily.   guaiFENesin 600 MG 12 hr tablet Commonly known as:  MUCINEX Take 1 tablet (600 mg total) by mouth 2 (two) times daily.   ipratropium-albuterol 0.5-2.5 (3) MG/3ML Soln Commonly known as:  DUONEB Take 3 mLs by nebulization every 6 (six) hours as needed.   loperamide 2 MG tablet Commonly known as:  IMODIUM A-D Take 1 tablet (2 mg total) by mouth 4 (four) times daily as needed for diarrhea or loose stools.   losartan 25 MG tablet Commonly known as:  COZAAR Take 1 tablet (25 mg total) by mouth daily.   mirtazapine 30 MG tablet Commonly known as:  REMERON Take 30 mg by mouth at bedtime.   omeprazole 40 MG capsule Commonly known as:  PRILOSEC Take 1 capsule (40 mg total) by mouth daily.   ondansetron 4 MG disintegrating tablet Commonly known as:  ZOFRAN ODT Take 1 tablet (4 mg total) by mouth every 8 (eight) hours as needed for nausea or vomiting.   OXYGEN Inhale 4 L into the lungs.   pantoprazole 40 MG tablet Commonly known as:  PROTONIX Take 40 mg by mouth 2 (two) times daily.   predniSONE 20 MG tablet Commonly known as:  DELTASONE Take 2 tablets (40 mg total) by mouth daily with breakfast. Start taking on:  August 05, 2018 What changed:    medication strength  how much to take  additional instructions   promethazine 25 MG tablet Commonly known as:  PHENERGAN Take 0.5 tablets (12.5 mg total) by mouth every 6 (six) hours as needed for nausea.   sertraline 50 MG tablet Commonly known as:  ZOLOFT Take 50 mg by mouth daily.   spironolactone 25 MG tablet Commonly known as:  ALDACTONE Take 1 tablet (25 mg total) by mouth daily.   torsemide 20 MG tablet Commonly known as:  DEMADEX Take 20 mg by mouth daily.   traMADol 50 MG tablet Commonly known as:  ULTRAM Take 50 mg  by mouth 2 (two) times daily.   traZODone 50 MG tablet Commonly known as:  DESYREL Take 1 tablet (50 mg total) by mouth at bedtime as needed for sleep.        DISCHARGE INSTRUCTIONS:  If you experience worsening of your admission symptoms, develop shortness of breath, life threatening emergency, suicidal or homicidal thoughts you must seek medical attention immediately by calling 911 or calling your MD immediately  if symptoms less severe.  You Must read complete instructions/literature along with all the possible adverse reactions/side effects for all the Medicines you take and that have been prescribed to you. Take any new Medicines after you have completely understood  and accept all the possible adverse reactions/side effects.   Please note  You were cared for by a hospitalist during your hospital stay. If you have any questions about your discharge medications or the care you received while you were in the hospital after you are discharged, you can call the unit and asked to speak with the hospitalist on call if the hospitalist that took care of you is not available. Once you are discharged, your primary care physician will handle any further medical issues. Please note that NO REFILLS for any discharge medications will be authorized once you are discharged, as it is imperative that you return to your primary care physician (or establish a relationship with a primary care physician if you do not have one) for your aftercare needs so that they can reassess your need for medications and monitor your lab values.    Today   CHIEF COMPLAINT:   Chief Complaint  Patient presents with  . Tachycardia    HISTORY OF PRESENT ILLNESS:  73 y.o. female who presents with chief complaint as above.  Patient presents to the ED with a complaint of chest discomfort and palpitations.  She is found by EMS to be tachycardic but is felt to be A. fib with RVR.  On arrival here in the ED she was given 2  doses of Cardizem and her rate controlled and her rhythm corrected.  Patient states that she has been coughing for the past couple weeks, and wheezing.  She has been using a lot of nebulizer treatments at home.  She feels like she is having exacerbation of her COPD.  Hospitalist were called for admission and treatment VITAL SIGNS:  Blood pressure (!) 121/59, pulse 78, temperature (!) 97.4 F (36.3 C), resp. rate 16, height 5\' 2"  (1.575 m), weight 104.1 kg, SpO2 97 %.  I/O:    Intake/Output Summary (Last 24 hours) at 08/04/2018 1024 Last data filed at 08/04/2018 1014 Gross per 24 hour  Intake 960 ml  Output 750 ml  Net 210 ml    PHYSICAL EXAMINATION:  GENERAL:  73 y.o.-year-old patient lying in the bed with no acute distress.  EYES: Pupils equal, round, reactive to light and accommodation. No scleral icterus. Extraocular muscles intact.  HEENT: Head atraumatic, normocephalic. Oropharynx and nasopharynx clear.  NECK:  Supple, no jugular venous distention. No thyroid enlargement, no tenderness.  LUNGS: Normal breath sounds bilaterally, no wheezing, rales,rhonchi or crepitation. No use of accessory muscles of respiration.  CARDIOVASCULAR: S1, S2 normal. No murmurs, rubs, or gallops.  ABDOMEN: Soft, non-tender, non-distended. Bowel sounds present. No organomegaly or mass.  EXTREMITIES: No pedal edema, cyanosis, or clubbing.  NEUROLOGIC: Cranial nerves II through XII are intact. Muscle strength 5/5 in all extremities. Sensation intact. Gait not checked.  PSYCHIATRIC: The patient is alert and oriented x 3.  SKIN: No obvious rash, lesion, or ulcer.   DATA REVIEW:   CBC Recent Labs  Lab 08/03/18 0257  WBC 8.4  HGB 10.4*  HCT 34.4*  PLT 178    Chemistries  Recent Labs  Lab 08/03/18 0257  NA 140  K 4.1  CL 103  CO2 30  GLUCOSE 111*  BUN 17  CREATININE 1.07*  CALCIUM 8.5*    Cardiac Enzymes Recent Labs  Lab 08/02/18 1916  TROPONINI <0.03    Microbiology Results  Results  for orders placed or performed during the hospital encounter of 06/25/18  Gastrointestinal Panel by PCR , Stool     Status: Abnormal  Collection Time: 06/25/18  9:15 AM  Result Value Ref Range Status   Campylobacter species NOT DETECTED NOT DETECTED Final   Plesimonas shigelloides NOT DETECTED NOT DETECTED Final   Salmonella species NOT DETECTED NOT DETECTED Final   Yersinia enterocolitica NOT DETECTED NOT DETECTED Final   Vibrio species NOT DETECTED NOT DETECTED Final   Vibrio cholerae NOT DETECTED NOT DETECTED Final   Enteroaggregative E coli (EAEC) NOT DETECTED NOT DETECTED Final   Enteropathogenic E coli (EPEC) NOT DETECTED NOT DETECTED Final   Enterotoxigenic E coli (ETEC) NOT DETECTED NOT DETECTED Final   Shiga like toxin producing E coli (STEC) NOT DETECTED NOT DETECTED Final   Shigella/Enteroinvasive E coli (EIEC) NOT DETECTED NOT DETECTED Final   Cryptosporidium NOT DETECTED NOT DETECTED Final   Cyclospora cayetanensis NOT DETECTED NOT DETECTED Final   Entamoeba histolytica NOT DETECTED NOT DETECTED Final   Giardia lamblia NOT DETECTED NOT DETECTED Final   Adenovirus F40/41 NOT DETECTED NOT DETECTED Final   Astrovirus NOT DETECTED NOT DETECTED Final   Norovirus GI/GII DETECTED (A) NOT DETECTED Final    Comment: RESULT CALLED TO, READ BACK BY AND VERIFIED WITH: LORRIE LEMONS AT 1145 ON 06/25/18 BY SNJ.    Rotavirus A NOT DETECTED NOT DETECTED Final   Sapovirus (I, II, IV, and V) NOT DETECTED NOT DETECTED Final    Comment: Performed at [facility], [address] [place], [place] [address]    RADIOLOGY:  Dg Chest 2 View  Result Date: 08/02/2018 CLINICAL DATA:  Chest pain, shortness of breath, and tachycardia. EXAM: CHEST - 2 VIEW COMPARISON:  Chest x-ray dated June 16, 2018. FINDINGS: Stable cardiomegaly and mild interstitial pulmonary edema. Bibasilar atelectasis. No focal consolidation, pleural effusion, or pneumothorax. No acute osseous abnormality. Prior  right mid clavicle ORIF. IMPRESSION: Stable cardiomegaly and mild interstitial pulmonary edema. Electronically Signed   By: Obie Dredge M.D.   On: 08/02/2018 19:52    EKG:   Orders placed or performed during the hospital encounter of 08/02/18  . ED EKG within 10 minutes  . ED EKG within 10 minutes  . EKG 12-Lead  . EKG 12-Lead      Management plans discussed with the patient, family and they are in agreement.  CODE STATUS:     Code Status Orders  (From admission, onward)         Start     Ordered   08/03/18 0108  Full code  Continuous     08/03/18 0107        Code Status History    Date Active Date Inactive Code Status Order ID Comments User Context   06/16/2018 2214 06/19/2018 1855 Full Code [id number]  Ihor Austin, MD ED   05/29/2018 0208 05/29/2018 1534 Full Code [id number]  Oralia Manis, MD ED   03/13/2017 1205 03/18/2017 1809 Full Code [id number]  Gracelyn Nurse, MD Inpatient   06/03/2016 1045 06/05/2016 1630 Full Code [id number]  Shaune Pollack, MD Inpatient      TOTAL TIME TAKING CARE OF THIS PATIENT: 45 minutes.    Evelena Asa Alann Avey M.D on 08/04/2018 at 10:24 AM  Between 7am to 6pm - Pager - [phone]  After 6pm go to [email or web address] - password [email or web address]  [facility]  Office  [phone]  CC: Primary care physician; Barbette Reichmann, MD   Note: This dictation was prepared with Dragon dictation along with smaller phrase technology. Any transcriptional errors that result from this process are unintentional.

## 2018-09-05 ENCOUNTER — Other Ambulatory Visit: Payer: Self-pay | Admitting: Internal Medicine

## 2018-09-05 DIAGNOSIS — Z1231 Encounter for screening mammogram for malignant neoplasm of breast: Secondary | ICD-10-CM

## 2018-10-03 ENCOUNTER — Ambulatory Visit: Payer: Medicare Other

## 2018-11-05 ENCOUNTER — Other Ambulatory Visit: Payer: Self-pay

## 2018-11-05 ENCOUNTER — Inpatient Hospital Stay
Admission: EM | Admit: 2018-11-05 | Discharge: 2018-11-25 | DRG: 207 | Disposition: E | Payer: Medicare Other | Attending: Internal Medicine | Admitting: Internal Medicine

## 2018-11-05 ENCOUNTER — Emergency Department: Payer: Medicare Other

## 2018-11-05 DIAGNOSIS — Z6841 Body Mass Index (BMI) 40.0 and over, adult: Secondary | ICD-10-CM | POA: Diagnosis not present

## 2018-11-05 DIAGNOSIS — J181 Lobar pneumonia, unspecified organism: Secondary | ICD-10-CM

## 2018-11-05 DIAGNOSIS — Z87891 Personal history of nicotine dependence: Secondary | ICD-10-CM

## 2018-11-05 DIAGNOSIS — I2510 Atherosclerotic heart disease of native coronary artery without angina pectoris: Secondary | ICD-10-CM | POA: Diagnosis present

## 2018-11-05 DIAGNOSIS — E1165 Type 2 diabetes mellitus with hyperglycemia: Secondary | ICD-10-CM | POA: Diagnosis present

## 2018-11-05 DIAGNOSIS — J9600 Acute respiratory failure, unspecified whether with hypoxia or hypercapnia: Secondary | ICD-10-CM

## 2018-11-05 DIAGNOSIS — N183 Chronic kidney disease, stage 3 (moderate): Secondary | ICD-10-CM | POA: Diagnosis present

## 2018-11-05 DIAGNOSIS — N179 Acute kidney failure, unspecified: Secondary | ICD-10-CM | POA: Diagnosis present

## 2018-11-05 DIAGNOSIS — Z1612 Extended spectrum beta lactamase (ESBL) resistance: Secondary | ICD-10-CM | POA: Diagnosis not present

## 2018-11-05 DIAGNOSIS — Z9981 Dependence on supplemental oxygen: Secondary | ICD-10-CM

## 2018-11-05 DIAGNOSIS — I130 Hypertensive heart and chronic kidney disease with heart failure and stage 1 through stage 4 chronic kidney disease, or unspecified chronic kidney disease: Secondary | ICD-10-CM | POA: Diagnosis present

## 2018-11-05 DIAGNOSIS — Z7982 Long term (current) use of aspirin: Secondary | ICD-10-CM

## 2018-11-05 DIAGNOSIS — I420 Dilated cardiomyopathy: Secondary | ICD-10-CM | POA: Diagnosis not present

## 2018-11-05 DIAGNOSIS — E870 Hyperosmolality and hypernatremia: Secondary | ICD-10-CM | POA: Diagnosis not present

## 2018-11-05 DIAGNOSIS — Z96659 Presence of unspecified artificial knee joint: Secondary | ICD-10-CM | POA: Diagnosis present

## 2018-11-05 DIAGNOSIS — I5042 Chronic combined systolic (congestive) and diastolic (congestive) heart failure: Secondary | ICD-10-CM | POA: Diagnosis present

## 2018-11-05 DIAGNOSIS — E1122 Type 2 diabetes mellitus with diabetic chronic kidney disease: Secondary | ICD-10-CM | POA: Diagnosis present

## 2018-11-05 DIAGNOSIS — J9621 Acute and chronic respiratory failure with hypoxia: Secondary | ICD-10-CM | POA: Diagnosis not present

## 2018-11-05 DIAGNOSIS — Z7952 Long term (current) use of systemic steroids: Secondary | ICD-10-CM

## 2018-11-05 DIAGNOSIS — J9602 Acute respiratory failure with hypercapnia: Secondary | ICD-10-CM | POA: Diagnosis not present

## 2018-11-05 DIAGNOSIS — Z8249 Family history of ischemic heart disease and other diseases of the circulatory system: Secondary | ICD-10-CM

## 2018-11-05 DIAGNOSIS — Z66 Do not resuscitate: Secondary | ICD-10-CM | POA: Diagnosis present

## 2018-11-05 DIAGNOSIS — J9622 Acute and chronic respiratory failure with hypercapnia: Secondary | ICD-10-CM | POA: Diagnosis present

## 2018-11-05 DIAGNOSIS — Z7951 Long term (current) use of inhaled steroids: Secondary | ICD-10-CM

## 2018-11-05 DIAGNOSIS — I340 Nonrheumatic mitral (valve) insufficiency: Secondary | ICD-10-CM | POA: Diagnosis present

## 2018-11-05 DIAGNOSIS — Z882 Allergy status to sulfonamides status: Secondary | ICD-10-CM

## 2018-11-05 DIAGNOSIS — J441 Chronic obstructive pulmonary disease with (acute) exacerbation: Secondary | ICD-10-CM | POA: Diagnosis present

## 2018-11-05 DIAGNOSIS — I5023 Acute on chronic systolic (congestive) heart failure: Secondary | ICD-10-CM | POA: Diagnosis not present

## 2018-11-05 DIAGNOSIS — J440 Chronic obstructive pulmonary disease with acute lower respiratory infection: Secondary | ICD-10-CM | POA: Diagnosis present

## 2018-11-05 DIAGNOSIS — Z888 Allergy status to other drugs, medicaments and biological substances status: Secondary | ICD-10-CM

## 2018-11-05 DIAGNOSIS — J9690 Respiratory failure, unspecified, unspecified whether with hypoxia or hypercapnia: Secondary | ICD-10-CM

## 2018-11-05 DIAGNOSIS — J189 Pneumonia, unspecified organism: Principal | ICD-10-CM | POA: Diagnosis present

## 2018-11-05 DIAGNOSIS — J9601 Acute respiratory failure with hypoxia: Secondary | ICD-10-CM | POA: Diagnosis not present

## 2018-11-05 DIAGNOSIS — I472 Ventricular tachycardia: Secondary | ICD-10-CM | POA: Diagnosis present

## 2018-11-05 DIAGNOSIS — J156 Pneumonia due to other aerobic Gram-negative bacteria: Secondary | ICD-10-CM | POA: Diagnosis not present

## 2018-11-05 DIAGNOSIS — Z7189 Other specified counseling: Secondary | ICD-10-CM | POA: Diagnosis not present

## 2018-11-05 DIAGNOSIS — R7881 Bacteremia: Secondary | ICD-10-CM | POA: Diagnosis present

## 2018-11-05 DIAGNOSIS — F419 Anxiety disorder, unspecified: Secondary | ICD-10-CM | POA: Diagnosis present

## 2018-11-05 DIAGNOSIS — G9340 Encephalopathy, unspecified: Secondary | ICD-10-CM | POA: Diagnosis present

## 2018-11-05 DIAGNOSIS — D649 Anemia, unspecified: Secondary | ICD-10-CM | POA: Diagnosis not present

## 2018-11-05 DIAGNOSIS — D696 Thrombocytopenia, unspecified: Secondary | ICD-10-CM | POA: Diagnosis present

## 2018-11-05 DIAGNOSIS — J95859 Other complication of respirator [ventilator]: Secondary | ICD-10-CM | POA: Diagnosis not present

## 2018-11-05 DIAGNOSIS — Z20828 Contact with and (suspected) exposure to other viral communicable diseases: Secondary | ICD-10-CM | POA: Diagnosis present

## 2018-11-05 DIAGNOSIS — B9620 Unspecified Escherichia coli [E. coli] as the cause of diseases classified elsewhere: Secondary | ICD-10-CM | POA: Diagnosis present

## 2018-11-05 DIAGNOSIS — Z515 Encounter for palliative care: Secondary | ICD-10-CM | POA: Diagnosis present

## 2018-11-05 DIAGNOSIS — Z9911 Dependence on respirator [ventilator] status: Secondary | ICD-10-CM | POA: Diagnosis not present

## 2018-11-05 DIAGNOSIS — R6520 Severe sepsis without septic shock: Secondary | ICD-10-CM | POA: Diagnosis not present

## 2018-11-05 DIAGNOSIS — K219 Gastro-esophageal reflux disease without esophagitis: Secondary | ICD-10-CM | POA: Diagnosis present

## 2018-11-05 DIAGNOSIS — A499 Bacterial infection, unspecified: Secondary | ICD-10-CM | POA: Diagnosis not present

## 2018-11-05 DIAGNOSIS — J1569 Pneumonia due to other gram-negative bacteria: Secondary | ICD-10-CM

## 2018-11-05 DIAGNOSIS — A419 Sepsis, unspecified organism: Secondary | ICD-10-CM | POA: Diagnosis not present

## 2018-11-05 DIAGNOSIS — Z79899 Other long term (current) drug therapy: Secondary | ICD-10-CM

## 2018-11-05 LAB — CBC WITH DIFFERENTIAL/PLATELET
Abs Immature Granulocytes: 0.06 10*3/uL (ref 0.00–0.07)
Basophils Absolute: 0 10*3/uL (ref 0.0–0.1)
Basophils Relative: 0 %
Eosinophils Absolute: 0.1 10*3/uL (ref 0.0–0.5)
Eosinophils Relative: 1 %
HCT: 41.4 % (ref 36.0–46.0)
Hemoglobin: 12.2 g/dL (ref 12.0–15.0)
Immature Granulocytes: 1 %
Lymphocytes Relative: 11 %
Lymphs Abs: 1.2 10*3/uL (ref 0.7–4.0)
MCH: 28.3 pg (ref 26.0–34.0)
MCHC: 29.5 g/dL — ABNORMAL LOW (ref 30.0–36.0)
MCV: 96.1 fL (ref 80.0–100.0)
Monocytes Absolute: 0.7 10*3/uL (ref 0.1–1.0)
Monocytes Relative: 7 %
Neutro Abs: 8.7 10*3/uL — ABNORMAL HIGH (ref 1.7–7.7)
Neutrophils Relative %: 80 %
Platelets: 144 10*3/uL — ABNORMAL LOW (ref 150–400)
RBC: 4.31 MIL/uL (ref 3.87–5.11)
RDW: 16 % — ABNORMAL HIGH (ref 11.5–15.5)
WBC: 10.8 10*3/uL — ABNORMAL HIGH (ref 4.0–10.5)
nRBC: 0 % (ref 0.0–0.2)

## 2018-11-05 LAB — COMPREHENSIVE METABOLIC PANEL
ALT: 21 U/L (ref 0–44)
AST: 22 U/L (ref 15–41)
Albumin: 3.3 g/dL — ABNORMAL LOW (ref 3.5–5.0)
Alkaline Phosphatase: 60 U/L (ref 38–126)
Anion gap: 11 (ref 5–15)
BUN: 22 mg/dL (ref 8–23)
CO2: 30 mmol/L (ref 22–32)
Calcium: 8.2 mg/dL — ABNORMAL LOW (ref 8.9–10.3)
Chloride: 102 mmol/L (ref 98–111)
Creatinine, Ser: 1.27 mg/dL — ABNORMAL HIGH (ref 0.44–1.00)
GFR calc Af Amer: 48 mL/min — ABNORMAL LOW (ref >60)
GFR calc non Af Amer: 42 mL/min — ABNORMAL LOW (ref >60)
Glucose, Bld: 203 mg/dL — ABNORMAL HIGH (ref 70–99)
Potassium: 4.9 mmol/L (ref 3.5–5.1)
Sodium: 143 mmol/L (ref 135–145)
Total Bilirubin: 0.7 mg/dL (ref 0.3–1.2)
Total Protein: 6.7 g/dL (ref 6.5–8.1)

## 2018-11-05 LAB — BLOOD GAS, ARTERIAL
Acid-Base Excess: 4 mmol/L — ABNORMAL HIGH (ref 0.0–2.0)
Bicarbonate: 32.2 mmol/L — ABNORMAL HIGH (ref 20.0–28.0)
FIO2: 1
MECHVT: 500 mL
O2 Saturation: 99.9 %
PEEP: 5 cmH2O
Patient temperature: 37
RATE: 18 resp/min
pCO2 arterial: 64 mmHg — ABNORMAL HIGH (ref 32.0–48.0)
pH, Arterial: 7.31 — ABNORMAL LOW (ref 7.350–7.450)
pO2, Arterial: 302 mmHg — ABNORMAL HIGH (ref 83.0–108.0)

## 2018-11-05 LAB — URINALYSIS, COMPLETE (UACMP) WITH MICROSCOPIC
Bacteria, UA: NONE SEEN
Bilirubin Urine: NEGATIVE
Glucose, UA: NEGATIVE mg/dL
Ketones, ur: NEGATIVE mg/dL
Nitrite: NEGATIVE
Protein, ur: 100 mg/dL — AB
RBC / HPF: >50 RBC/hpf — ABNORMAL HIGH (ref 0–5)
Specific Gravity, Urine: 1.013 (ref 1.005–1.030)
WBC, UA: >50 WBC/hpf — ABNORMAL HIGH (ref 0–5)
pH: 6 (ref 5.0–8.0)

## 2018-11-05 LAB — BRAIN NATRIURETIC PEPTIDE: B Natriuretic Peptide: 545 pg/mL — ABNORMAL HIGH (ref 0.0–100.0)

## 2018-11-05 LAB — MRSA PCR SCREENING: MRSA by PCR: NEGATIVE

## 2018-11-05 LAB — LACTIC ACID, PLASMA
Lactic Acid, Venous: 3 mmol/L (ref 0.5–1.9)
Lactic Acid, Venous: 1.2 mmol/L (ref 0.5–1.9)

## 2018-11-05 LAB — GLUCOSE, CAPILLARY
Glucose-Capillary: 114 mg/dL — ABNORMAL HIGH (ref 70–99)
Glucose-Capillary: 144 mg/dL — ABNORMAL HIGH (ref 70–99)
Glucose-Capillary: 144 mg/dL — ABNORMAL HIGH (ref 70–99)
Glucose-Capillary: 154 mg/dL — ABNORMAL HIGH (ref 70–99)

## 2018-11-05 LAB — PROCALCITONIN: Procalcitonin: 0.54 ng/mL

## 2018-11-05 LAB — TRIGLYCERIDES: Triglycerides: 54 mg/dL (ref <150)

## 2018-11-05 LAB — MAGNESIUM: Magnesium: 1.9 mg/dL (ref 1.7–2.4)

## 2018-11-05 MED ORDER — IPRATROPIUM-ALBUTEROL 0.5-2.5 (3) MG/3ML IN SOLN
3.0000 mL | Freq: Four times a day (QID) | RESPIRATORY_TRACT | Status: DC
  Administered 2018-11-05 – 2018-11-08 (×14): 3 mL via RESPIRATORY_TRACT
  Filled 2018-11-05 (×13): qty 3

## 2018-11-05 MED ORDER — ALBUTEROL SULFATE HFA 108 (90 BASE) MCG/ACT IN AERS
1.0000 | INHALATION_SPRAY | RESPIRATORY_TRACT | Status: DC | PRN
  Filled 2018-11-05: qty 6.7

## 2018-11-05 MED ORDER — SODIUM CHLORIDE 0.9 % IV SOLN
1.0000 g | INTRAVENOUS | Status: DC
  Filled 2018-11-05: qty 10

## 2018-11-05 MED ORDER — SODIUM CHLORIDE 0.9 % IV SOLN
1.0000 g | Freq: Once | INTRAVENOUS | Status: AC
  Administered 2018-11-05: 1 g via INTRAVENOUS
  Filled 2018-11-05: qty 10

## 2018-11-05 MED ORDER — CHLORHEXIDINE GLUCONATE 0.12% ORAL RINSE (MEDLINE KIT)
15.0000 mL | Freq: Two times a day (BID) | OROMUCOSAL | Status: DC
  Administered 2018-11-05 – 2018-11-10 (×11): 15 mL via OROMUCOSAL

## 2018-11-05 MED ORDER — ORAL CARE MOUTH RINSE
15.0000 mL | OROMUCOSAL | Status: DC
  Administered 2018-11-05 – 2018-11-10 (×51): 15 mL via OROMUCOSAL

## 2018-11-05 MED ORDER — INSULIN ASPART 100 UNIT/ML ~~LOC~~ SOLN: 0.0000 [IU] | SUBCUTANEOUS | Status: DC

## 2018-11-05 MED ORDER — DEXMEDETOMIDINE HCL IN NACL 400 MCG/100ML IV SOLN: 0.4000 ug/kg/h | INTRAVENOUS | Status: DC

## 2018-11-05 MED ORDER — DEXMEDETOMIDINE HCL IN NACL 400 MCG/100ML IV SOLN
0.4000 ug/kg/h | INTRAVENOUS | Status: DC
  Administered 2018-11-05: 0.4 ug/kg/h via INTRAVENOUS
  Administered 2018-11-05: 0.6 ug/kg/h via INTRAVENOUS

## 2018-11-05 MED ORDER — ACETAMINOPHEN 325 MG PO TABS: 650.0000 mg | ORAL_TABLET | Freq: Four times a day (QID) | ORAL | Status: DC | PRN

## 2018-11-05 MED ORDER — SODIUM CHLORIDE 0.9 % IV BOLUS (SEPSIS)
1000.0000 mL | Freq: Once | INTRAVENOUS | Status: AC
  Administered 2018-11-05: 1000 mL via INTRAVENOUS

## 2018-11-05 MED ORDER — LORAZEPAM 2 MG/ML IJ SOLN
INTRAMUSCULAR | Status: AC
  Administered 2018-11-05: 09:00:00 via INTRAVENOUS
  Filled 2018-11-05: qty 1

## 2018-11-05 MED ORDER — ENOXAPARIN SODIUM 40 MG/0.4ML ~~LOC~~ SOLN
40.0000 mg | SUBCUTANEOUS | Status: DC
  Administered 2018-11-05 – 2018-11-10 (×6): 40 mg via SUBCUTANEOUS
  Filled 2018-11-05 (×6): qty 0.4

## 2018-11-05 MED ORDER — FAMOTIDINE IN NACL 20-0.9 MG/50ML-% IV SOLN
20.0000 mg | INTRAVENOUS | Status: DC
  Administered 2018-11-05 – 2018-11-07 (×3): 20 mg via INTRAVENOUS
  Filled 2018-11-05 (×3): qty 50

## 2018-11-05 MED ORDER — SUCCINYLCHOLINE CHLORIDE 20 MG/ML IJ SOLN
100.0000 mg | Freq: Once | INTRAMUSCULAR | Status: AC
  Administered 2018-11-05: 100 mg via INTRAVENOUS

## 2018-11-05 MED ORDER — PROPOFOL 1000 MG/100ML IV EMUL
0.0000 ug/kg/min | INTRAVENOUS | Status: DC
  Administered 2018-11-05: 50 ug/kg/min via INTRAVENOUS
  Administered 2018-11-05 – 2018-11-06 (×4): 40 ug/kg/min via INTRAVENOUS
  Administered 2018-11-06 (×2): 50 ug/kg/min via INTRAVENOUS
  Administered 2018-11-07: 45 ug/kg/min via INTRAVENOUS
  Administered 2018-11-07: 50 ug/kg/min via INTRAVENOUS
  Administered 2018-11-07: 35 ug/kg/min via INTRAVENOUS
  Administered 2018-11-07: 50 ug/kg/min via INTRAVENOUS
  Administered 2018-11-08 (×2): 40 ug/kg/min via INTRAVENOUS
  Administered 2018-11-08: 30.331 ug/kg/min via INTRAVENOUS
  Filled 2018-11-05 (×18): qty 100

## 2018-11-05 MED ORDER — SODIUM CHLORIDE 0.9 % IV BOLUS (SEPSIS): 1000.0000 mL | Freq: Once | INTRAVENOUS | Status: DC

## 2018-11-05 MED ORDER — SODIUM CHLORIDE 0.9 % IV BOLUS (SEPSIS): 500.0000 mL | Freq: Once | INTRAVENOUS | Status: DC

## 2018-11-05 MED ORDER — SODIUM CHLORIDE 0.9 % IV SOLN
500.0000 mg | INTRAVENOUS | Status: DC
  Filled 2018-11-05: qty 500

## 2018-11-05 MED ORDER — ALBUTEROL SULFATE (2.5 MG/3ML) 0.083% IN NEBU
2.5000 mg | INHALATION_SOLUTION | RESPIRATORY_TRACT | Status: DC | PRN
  Filled 2018-11-05: qty 3

## 2018-11-05 MED ORDER — FENTANYL CITRATE (PF) 100 MCG/2ML IJ SOLN
25.0000 ug | INTRAMUSCULAR | Status: DC | PRN
  Administered 2018-11-06: 50 ug via INTRAVENOUS
  Administered 2018-11-06: 25 ug via INTRAVENOUS
  Administered 2018-11-07 (×2): 50 ug via INTRAVENOUS
  Filled 2018-11-05 (×5): qty 2

## 2018-11-05 MED ORDER — FENTANYL CITRATE (PF) 100 MCG/2ML IJ SOLN
50.0000 ug | Freq: Once | INTRAMUSCULAR | Status: AC
  Administered 2018-11-05: 50 ug via INTRAVENOUS

## 2018-11-05 MED ORDER — ACETAMINOPHEN 650 MG RE SUPP: 650.0000 mg | Freq: Four times a day (QID) | RECTAL | Status: DC | PRN

## 2018-11-05 MED ORDER — METHYLPREDNISOLONE SODIUM SUCC 40 MG IJ SOLR
40.0000 mg | Freq: Two times a day (BID) | INTRAMUSCULAR | Status: DC
  Administered 2018-11-05 – 2018-11-07 (×4): 40 mg via INTRAVENOUS
  Filled 2018-11-05 (×4): qty 1

## 2018-11-05 MED ORDER — ALBUTEROL SULFATE (2.5 MG/3ML) 0.083% IN NEBU: 2.5000 mg | INHALATION_SOLUTION | RESPIRATORY_TRACT | Status: DC | PRN

## 2018-11-05 MED ORDER — SODIUM CHLORIDE 0.45 % IV SOLN
INTRAVENOUS | Status: DC
  Administered 2018-11-05: 17:00:00 via INTRAVENOUS

## 2018-11-05 MED ORDER — ONDANSETRON HCL 4 MG PO TABS: 4.0000 mg | ORAL_TABLET | Freq: Four times a day (QID) | ORAL | Status: DC | PRN

## 2018-11-05 MED ORDER — SODIUM CHLORIDE 0.9 % IV SOLN
1000.0000 mL | Freq: Once | INTRAVENOUS | Status: AC
  Administered 2018-11-05: 1000 mL via INTRAVENOUS

## 2018-11-05 MED ORDER — ONDANSETRON HCL 4 MG/2ML IJ SOLN: 4.0000 mg | Freq: Four times a day (QID) | INTRAMUSCULAR | Status: DC | PRN

## 2018-11-05 MED ORDER — ETOMIDATE 2 MG/ML IV SOLN
20.0000 mg | Freq: Once | INTRAVENOUS | Status: AC
  Administered 2018-11-05: 20 mg via INTRAVENOUS

## 2018-11-05 MED ORDER — INSULIN ASPART 100 UNIT/ML ~~LOC~~ SOLN
0.0000 [IU] | SUBCUTANEOUS | Status: DC
  Administered 2018-11-05 (×2): 2 [IU] via SUBCUTANEOUS
  Administered 2018-11-05 – 2018-11-06 (×2): 3 [IU] via SUBCUTANEOUS
  Administered 2018-11-06: 2 [IU] via SUBCUTANEOUS
  Administered 2018-11-06 (×4): 3 [IU] via SUBCUTANEOUS
  Administered 2018-11-07: 2 [IU] via SUBCUTANEOUS
  Administered 2018-11-07 (×4): 3 [IU] via SUBCUTANEOUS
  Administered 2018-11-08: 2 [IU] via SUBCUTANEOUS
  Administered 2018-11-08 (×2): 3 [IU] via SUBCUTANEOUS
  Administered 2018-11-08: 2 [IU] via SUBCUTANEOUS
  Administered 2018-11-08: 3 [IU] via SUBCUTANEOUS
  Administered 2018-11-08: 2 [IU] via SUBCUTANEOUS
  Administered 2018-11-08 – 2018-11-09 (×4): 3 [IU] via SUBCUTANEOUS
  Administered 2018-11-09: 08:00:00 2 [IU] via SUBCUTANEOUS
  Administered 2018-11-09 (×2): 3 [IU] via SUBCUTANEOUS
  Administered 2018-11-10 (×2): 2 [IU] via SUBCUTANEOUS
  Filled 2018-11-05 (×29): qty 1

## 2018-11-05 MED ORDER — SODIUM CHLORIDE 0.9 % IV SOLN: INTRAVENOUS | Status: DC

## 2018-11-05 MED ORDER — BUDESONIDE 0.25 MG/2ML IN SUSP
0.2500 mg | Freq: Four times a day (QID) | RESPIRATORY_TRACT | Status: DC
  Administered 2018-11-05 – 2018-11-11 (×23): 0.25 mg via RESPIRATORY_TRACT
  Filled 2018-11-05 (×23): qty 2

## 2018-11-05 MED ORDER — LORAZEPAM 2 MG/ML IJ SOLN
2.0000 mg | Freq: Once | INTRAMUSCULAR | Status: AC
  Administered 2018-11-05: 09:00:00 via INTRAVENOUS

## 2018-11-05 MED ORDER — SODIUM CHLORIDE 0.9 % IV SOLN
500.0000 mg | Freq: Once | INTRAVENOUS | Status: AC
  Administered 2018-11-05: 500 mg via INTRAVENOUS
  Filled 2018-11-05: qty 500

## 2018-11-05 NOTE — ED Notes (Addendum)
EDP called to bedside d/t pt HR. On monitor appeared to have sustained V-tach that cleared up on own after about 15 seconds. HR jumped from 80's t0 180's/ Pt is now back in NSR with few PVC's. Will continue to monitor.

## 2018-11-05 NOTE — ED Triage Notes (Addendum)
Pt came emergency traffic via [facility] to room 6 in resp distress. Hx COPD per EMS. States she was alert and talking in resp distress until EMS [place] off the highway and pt went unresponsive and was breathing about 5 times a minute. EMS began bagging pt. Pt continued to be bagged upon arrival. Pt began speaking a little bit upon arrival and was switched back to a non-rebreather at 15LMP. MD and RT at bedside with this RN to immediately intubate. Clinton Sawyer RN placed a PIV in R hand for this RN to push RIK medications. Unsure how long pt was having difficulty breathing. Unknown if pt wears home oxygen. Unsure if exposure to COVID. Unsure of fever at home.   All clothes were cut off pt. Pt had upper dentures, removed and placed in bag, as well as a pair of glasses.   Pt received 2 duonebs with EMS and 125mg  solumedrol IM R deltoid.

## 2018-11-05 NOTE — ED Notes (Signed)
Pt placed in slight trendelenburg position for hypotension.

## 2018-11-05 NOTE — Consult Note (Signed)
CODE SEPSIS - PHARMACY COMMUNICATION  **Broad Spectrum Antibiotics should be administered within 1 hour of Sepsis diagnosis**  Time Code Sepsis Called/Page Received: 1106 am  Antibiotics Ordered: ceftriaxone and azithromycin  Time of 1st antibiotic administration: 1030 am  Additional action taken by pharmacy: none required  If necessary, Name of Provider/Nurse Contacted: N/A    Lowella Bandy ,PharmD Clinical Pharmacist  10/27/2018  11:27 AM

## 2018-11-05 NOTE — ED Notes (Signed)
Called RT to transport pt to ICU room 6, stated she would be in ED in about 10 minutes.

## 2018-11-05 NOTE — ED Notes (Signed)
RT at bedside to change vent settings.

## 2018-11-05 NOTE — ED Notes (Signed)
Admitting MD at bedside.

## 2018-11-05 NOTE — Consult Note (Signed)
PCCM CONSULT NOTE  INITIAL PRESENTATION: 73 y.o. F with history of COPD, dilated cardiomyopathy (LVEF 25-30% by echo 2018), mitral regurgitation presented to [facility] ED via EMS with altered mental status and hypoxemic respiratory failure.  Intubated in ED  MAJOR EVENTS/TEST RESULTS: 04/11 admission as documented above  INDWELLING DEVICES:: ETT 04/11 >>    MICRO DATA: MRSA PCR 4/11>>  Urine 4/11>>  Resp 4/11>>  Blood 4/11>>  COVID-19 04/11 >>   PCT 04/11 0.54,   ANTIMICROBIALS:  Azithromycin 04/11 >>  Ceftriaxone 04/11 >>    HPI: Intubated.  No family at bedside.  Level 5 caveat  Past Medical History:  Diagnosis Date  . Asthma   . Chronic combined systolic (congestive) and diastolic (congestive) heart failure ([place])    a. 02/2017 Echo: EF 25-30%, Gr2 DD.  Marland Kitchen COPD (chronic obstructive pulmonary disease) ([place])    a. On supplemental O2 @ home.  Marland Kitchen Hypertension   . Lung nodule   . Narrow complex tachycardia ([place])    a. 02/2017 - ? NSVT vs Afib-->placed on amio.  No OAC.  Marland Kitchen NICM (nonischemic cardiomyopathy) ([place])    a. 02/2017 Echo: Ef 25-30%, mild conc LVH, Gr2 DD, sev MR, mod dil LA;  b. 02/2016 Cath [facility]): nonobs dzs.  . Non-obstructive CAD    a. 02/2016 Cath [facility]): LM 30, LAD 20ost/p, 36m 30d, D1 50p, D2 30, D3 20, RI 20, LCX 40p, OM1 30, OM2 20, RCA 20 diffuse, RPL2 30, RPL3 30.  .Marland KitchenSevere mitral regurgitation    a. 02/2017 Echo: Sev MR.   Social History   Socioeconomic History  . Marital status: Legally Separated    Spouse name: Not on file  . Number of children: 2  . Years of education: Not on file  . Highest education level: 10th grade  Occupational History  . Occupation: RETIRED  Social Needs  . Financial resource strain: Not hard at all  . Food insecurity:    Worry: Never true    Inability: Never true  . Transportation needs:    Medical: Yes    Non-medical: Yes  Tobacco Use  . Smoking status: Former Smoker    Packs/day: 3.00    Years: 55.00    Pack  years: 165.00    Types: Cigarettes    Last attempt to quit: 2018    Years since quitting: 2.2  . Smokeless tobacco: Never Used  . Tobacco comment: smoking cessation materials not required  Substance and Sexual Activity  . Alcohol use: No  . Drug use: No  . Sexual activity: Not Currently  Lifestyle  . Physical activity:    Days per week: 0 days    Minutes per session: 0 min  . Stress: Very much  Relationships  . Social connections:    Talks on phone: Patient refused    Gets together: Patient refused    Attends religious service: Patient refused    Active member of club or organization: Patient refused    Attends meetings of clubs or organizations: Patient refused    Relationship status: Separated  . Intimate partner violence:    Fear of current or ex partner: No    Emotionally abused: No    Physically abused: No    Forced sexual activity: No  Other Topics Concern  . Not on file  Social History Narrative  . Not on file   Family History  Problem Relation Age of Onset  . Heart disease Mother   . Heart disease Sister   .  Cancer Sister      SUBJ:    OBJ: Vitals:   11/07/2018 1536 11/03/2018 1600 11/12/2018 1700 11/09/2018 1725  BP:  125/64 (!) 104/52   Pulse:  80 62   Resp:  20 15   Temp:    98 F (36.7 C)  TempSrc:    Oral  SpO2: 97% 98% 96%   Weight:      Height:        Vent Mode: PRVC FiO2 (%):  [35 %-100 %] 35 % Set Rate:  [15 bmp-18 bmp] 15 bmp Vt Set:  [500 mL] 500 mL PEEP:  [5 cmH20] 5 cmH20 Plateau Pressure:  [20 cmH20] 20 cmH20   Intake/Output Summary (Last 24 hours) at 11/21/2018 1753 Last data filed at 11/19/2018 1726 Gross per 24 hour  Intake 283.36 ml  Output 375 ml  Net -91.64 ml     Gen: Very obese, intubated, minimally responsive, RAST negative for HEENT: NCAT, sclerae white Neck: No JVD noted Lungs: Very distant prolonged expiratory wheezes Cardiovascular: Regular, soft systolic murmur radiating to axilla Abdomen: Obese, soft, minutes  to BS, not overtly tender Ext: Warm, no edema Neuro: Cranial nerves grossly intact, moves all extremities Skin: Limited exam, no lesions noted   BMP Latest Ref Rng & Units 11/03/2018 08/03/2018 08/02/2018  Glucose 70 - 99 mg/dL 203(H) 111(H) 107(H)  BUN 8 - 23 mg/dL _0 Creatinine 0.44 - 1.00 mg/dL 1.27(H) 1.07(H) 1.14(H)  BUN/Creat Ratio 12 - 28 - - -  Sodium 135 - 145 mmol/L 143 140 141  Potassium 3.5 - 5.1 mmol/L 4.9 4.1 3.9  Chloride 98 - 111 mmol/L 102 103 103  CO2 22 - 32 mmol/L _1 Calcium 8.9 - 10.3 mg/dL 8.2(L) 8.5(L) 8.5(L)    CBC Latest Ref Rng & Units 11/02/2018 08/03/2018 08/02/2018  WBC 4.0 - 10.5 K/uL 10.8(H) 8.4 9.2  Hemoglobin 12.0 - 15.0 g/dL 12.2 10.4(L) 11.6(L)  Hematocrit 36.0 - 46.0 % 41.4 34.4(L) 38.5  Platelets 150 - 400 K/uL 144(L) 178 205    Hepatic Function Latest Ref Rng & Units 11/23/2018 06/25/2018 12/07/2017  Total Protein 6.5 - 8.1 g/dL 6.7 6.5 -  Albumin 3.5 - 5.0 g/dL 3.3(L) 3.5 4.1  AST 15 - 41 U/L 22 20 -  ALT 0 - 44 U/L 21 18 -  Alk Phosphatase 38 - 126 U/L 60 43 -  Total Bilirubin 0.3 - 1.2 mg/dL 0.7 0.8 -    Cardiac Panel (last 3 results) No results for input(s): CKTOTAL, CKMB, TROPONINI, RELINDX in the last 72 hours.  CXR: Very vague RLL opacity - atelectasis versus infiltrate    IMPRESSION/PLAN: PULMONARY: Acute/chronic hypoxemic/hypercapnic respiratory failure Former smoker with history of COPD RLL opacity, possible pneumonia COVID-19 rule out (low likelihood)  Vent settings established Vent bundle implemented Daily SBT as indicated Nebulized steroids and bronchodilators ordered Methylprednisolone at 40 mg every 12 hours  CARDIOVASCULAR: History of dilated cardiomyopathy History of severe MR  ICU hemodynamic monitoring MAP goal >65 mmHg  RENAL: CKD Mild AKI  Monitor BMET intermittently Monitor I/Os Correct electrolytes as indicated 1/2 NS @ 50 cc/hr  GI/NUTRITION: Severe obesity  SUP: IV  famotidine Consider TF protocol 4/12  INFECTIOUS DISEASE: Possible community-acquired pneumonia  Monitor temp, WBC count Micro and abx as above  ENDOCRINE: DM 2 with hyperglycemia  Moderate scale SSI every 4 hours  HEME: Very mild thrombocytopenia  DVT px: Enoxaparin Monitor CBC intermittently Transfuse per usual guidelines  NEURO: Acute encephalopathy  PAD protocol: Propofol infusion, intermittent fentanyl RASS goal -1, -2   CCM time: 45 mins The above time includes time spent in consultation with patient and/or family members and reviewing care plan on multidisciplinary rounds  Merton Border, MD PCCM service Mobile [phone] Pager [phone] 11/12/2018 5:53 PM

## 2018-11-05 NOTE — ED Provider Notes (Signed)
[facility] Emergency Department Provider Note   ____________________________________________    I have reviewed the triage vital signs and the nursing notes.   HISTORY  Chief Complaint Respiratory distress  Patient with altered mental status and severe respiratory distress  HPI [name] [name] is a 73 y.o. female with a history of COPD, CHF, atrial fibrillation who presents in respiratory distress.  EMS reports that initial oxygen saturations upon arrival were in the low 90s, patient decompensated in route and arrived being bagged by EMS for agonal respirations and altered mental status.  They gave Solu-Medrol  Past Medical History:  Diagnosis Date   Asthma    Chronic combined systolic (congestive) and diastolic (congestive) heart failure (HCC)    a. 02/2017 Echo: EF 25-30%, Gr2 DD.   COPD (chronic obstructive pulmonary disease) (HCC)    a. On supplemental O2 @ home.   Hypertension    Lung nodule    Narrow complex tachycardia (HCC)    a. 02/2017 - ? NSVT vs Afib-->placed on amio.  No OAC.   NICM (nonischemic cardiomyopathy) (HCC)    a. 02/2017 Echo: Ef 25-30%, mild conc LVH, Gr2 DD, sev MR, mod dil LA;  b. 02/2016 Cath [facility]): nonobs dzs.   Non-obstructive CAD    a. 02/2016 Cath [facility]): LM 30, LAD 20ost/p, 48m, 30d, D1 50p, D2 30, D3 20, RI 20, LCX 40p, OM1 30, OM2 20, RCA 20 diffuse, RPL2 30, RPL3 30.   Severe mitral regurgitation    a. 02/2017 Echo: Sev MR.    Patient Active Problem List   Diagnosis Date Noted   Atrial fibrillation with RVR (HCC) 08/02/2018   COPD exacerbation (HCC) 06/16/2018   Severe mitral regurgitation 11/10/2017   OA (osteoarthritis) of knee 11/10/2017   CKD (chronic kidney disease) stage 3, GFR 30-59 ml/min (HCC) 08/20/2017   Insomnia 07/22/2017   Hyperlipidemia 07/01/2017   DDD (degenerative disc disease), lumbar 04/30/2017   B12 deficiency 04/30/2017   Gait instability 03/31/2017   Allergic  rhinitis 03/31/2017   Nausea 03/31/2017   GERD (gastroesophageal reflux disease) 03/31/2017   Chronic systolic heart failure (HCC) 03/24/2017   HTN (hypertension) 03/24/2017   COPD (chronic obstructive pulmonary disease) (HCC) 03/24/2017   Chronic pain 03/24/2017   MRSA carrier 03/18/2017   History of ventricular tachycardia 03/18/2017   Vitamin D deficiency 01/06/2017   Chronic atrial fibrillation 06/15/2016   Depression 06/15/2016   Varicose veins of lower extremity with inflammation 09/07/2011    Past Surgical History:  Procedure Laterality Date   REPLACEMENT TOTAL KNEE     TYMPANOSTOMY TUBE PLACEMENT Bilateral 03/19/2017    Prior to Admission medications   Medication Sig Start Date End Date Taking? Authorizing Provider  acetaminophen (TYLENOL) 500 MG tablet Take 2 tablets (1,000 mg total) by mouth 3 (three) times daily. 08/02/17   Plonk, Chrissie Noa, MD  albuterol (PROVENTIL HFA;VENTOLIN HFA) 108 (90 Base) MCG/ACT inhaler Inhale 2 puffs into the lungs every 6 (six) hours as needed for wheezing or shortness of breath. 12/07/17   Duanne Limerick, MD  aspirin 81 MG chewable tablet Chew 81 mg by mouth daily.    [provider]  atorvastatin (LIPITOR) 40 MG tablet Take 1 tablet (40 mg total) by mouth daily. 12/07/17   Duanne Limerick, MD  azithromycin (ZITHROMAX) 250 MG tablet 1 po daily 08/05/18   Salary, Jetty Duhamel D, MD  benzonatate (TESSALON) 200 MG capsule Take 1 capsule (200 mg total) by mouth 3 (three) times daily  as needed for cough. 08/04/18   Salary, Evelena Asa, MD  budesonide-formoterol (SYMBICORT) 160-4.5 MCG/ACT inhaler Inhale 2 puffs into the lungs 2 (two) times daily. 12/07/17   Duanne Limerick, MD  carvedilol (COREG) 25 MG tablet Take 1 tablet (25 mg total) by mouth 2 (two) times daily. 12/07/17   Duanne Limerick, MD  cholecalciferol (VITAMIN D3) 25 MCG (1000 UT) tablet Take 1,000 Units by mouth daily.    [provider]  Cyanocobalamin (B-12) 1000 MCG  TABS Take 1 tablet by mouth daily. 04/05/17   Plonk, Chrissie Noa, MD  fluticasone (FLONASE) 50 MCG/ACT nasal spray Place 2 sprays into both nostrils daily. Patient taking differently: Place 2 sprays into both nostrils 2 (two) times daily.  12/07/17   Duanne Limerick, MD  gabapentin (NEURONTIN) 600 MG tablet Take 1,200 mg by mouth 2 (two) times daily. 04/28/18   [provider]  guaiFENesin (MUCINEX) 600 MG 12 hr tablet Take 1 tablet (600 mg total) by mouth 2 (two) times daily. 06/19/18   Salary, Jetty Duhamel D, MD  ipratropium-albuterol (DUONEB) 0.5-2.5 (3) MG/3ML SOLN Take 3 mLs by nebulization every 6 (six) hours as needed.    [provider]  loperamide (IMODIUM A-D) 2 MG tablet Take 1 tablet (2 mg total) by mouth 4 (four) times daily as needed for diarrhea or loose stools. Patient not taking: Reported on 08/02/2018 06/25/18   Minna Antis, MD  losartan (COZAAR) 25 MG tablet Take 1 tablet (25 mg total) by mouth daily. 12/07/17   Duanne Limerick, MD  mirtazapine (REMERON) 30 MG tablet Take 30 mg by mouth at bedtime.  03/24/18   [provider]  omeprazole (PRILOSEC) 40 MG capsule Take 1 capsule (40 mg total) by mouth daily. Patient not taking: Reported on 06/17/2018 12/07/17 12/07/18  Duanne Limerick, MD  ondansetron (ZOFRAN ODT) 4 MG disintegrating tablet Take 1 tablet (4 mg total) by mouth every 8 (eight) hours as needed for nausea or vomiting. 06/25/18   Minna Antis, MD  OXYGEN Inhale 4 L into the lungs.     [provider]  pantoprazole (PROTONIX) 40 MG tablet Take 40 mg by mouth 2 (two) times daily. 06/03/18   [provider]  predniSONE (DELTASONE) 20 MG tablet Take 2 tablets (40 mg total) by mouth daily with breakfast. 08/05/18   Salary, Evelena Asa, MD  promethazine (PHENERGAN) 25 MG tablet Take 0.5 tablets (12.5 mg total) by mouth every 6 (six) hours as needed for nausea. 08/16/17   Plonk, Chrissie Noa, MD  sertraline (ZOLOFT) 50 MG tablet Take 50 mg by mouth  daily. 04/22/18   [provider]  spironolactone (ALDACTONE) 25 MG tablet Take 1 tablet (25 mg total) by mouth daily. 12/07/17   Duanne Limerick, MD  torsemide (DEMADEX) 20 MG tablet Take 20 mg by mouth daily. 04/22/18   [provider]  traMADol (ULTRAM) 50 MG tablet Take 50 mg by mouth 2 (two) times daily.  04/13/18   [provider]  traZODone (DESYREL) 50 MG tablet Take 1 tablet (50 mg total) by mouth at bedtime as needed for sleep. Patient not taking: Reported on 08/02/2018 12/13/17   Duanne Limerick, MD     Allergies Sulfa antibiotics; Cymbalta [duloxetine hcl]; and Tramadol hcl  Family History  Problem Relation Age of Onset   Heart disease Mother    Heart disease Sister    Cancer Sister     Social History Social History   Tobacco Use  Smoking status: Former Smoker    Packs/day: 3.00    Years: 55.00    Pack years: 165.00    Types: Cigarettes    Last attempt to quit: 2018    Years since quitting: 2.2   Smokeless tobacco: Never Used   Tobacco comment: smoking cessation materials not required  Substance Use Topics   Alcohol use: No   Drug use: No    Review of Systems     ____________________________________________   PHYSICAL EXAM:  VITAL SIGNS: ED Triage Vitals  Enc Vitals Group     BP 11/20/2018 0901 (!) 120/97     Pulse Rate 11/23/2018 0901 (!) 132     Resp 11/14/2018 0901 19     Temp --      Temp src --      SpO2 10/26/2018 0901 100 %     Weight 11/14/2018 0936 105 kg (231 lb 7.7 oz)     Height --      Head Circumference --      Peak Flow --      Pain Score --      Pain Loc --      Pain Edu? --      Excl. in GC? --     Constitutional: Not responding to questions, severe distress  Head: Atraumatic. Cardiovascular: Tachycardia, regular rhythm grossly normal heart sounds.  Good peripheral circulation. Respiratory: Tachypnea, retractions, scattered wheezes and bibasilar Rales Gastrointestinal: Soft. No distention.    Musculoskeletal:   Warm and well perfused Neurologic: No gross focal neurologic deficits are appreciated.  Skin:  Skin is warm, dry and intact. No rash noted.   ____________________________________________   LABS (all labs ordered are listed, but only abnormal results are displayed)  Labs Reviewed  BLOOD GAS, ARTERIAL - Abnormal; Notable for the following components:      Result Value   pH, Arterial 7.31 (*)    pCO2 arterial 64 (*)    pO2, Arterial 302 (*)    Bicarbonate 32.2 (*)    Acid-Base Excess 4.0 (*)    All other components within normal limits  LACTIC ACID, PLASMA - Abnormal; Notable for the following components:   Lactic Acid, Venous 3.0 (*)    All other components within normal limits  COMPREHENSIVE METABOLIC PANEL - Abnormal; Notable for the following components:   Glucose, Bld 203 (*)    Creatinine, Ser 1.27 (*)    Calcium 8.2 (*)    Albumin 3.3 (*)    GFR calc non Af Amer 42 (*)    GFR calc Af Amer 48 (*)    All other components within normal limits  CBC WITH DIFFERENTIAL/PLATELET - Abnormal; Notable for the following components:   WBC 10.8 (*)    MCHC 29.5 (*)    RDW 16.0 (*)    Platelets 144 (*)    Neutro Abs 8.7 (*)    All other components within normal limits  BRAIN NATRIURETIC PEPTIDE - Abnormal; Notable for the following components:   B Natriuretic Peptide 545.0 (*)    All other components within normal limits  CULTURE, BLOOD (ROUTINE X 2)  CULTURE, BLOOD (ROUTINE X 2)  URINE CULTURE  NOVEL CORONAVIRUS, NAA (HOSPITAL ORDER, SEND-OUT TO REF LAB)  PROCALCITONIN  LACTIC ACID, PLASMA  URINALYSIS, COMPLETE (UACMP) WITH MICROSCOPIC   ____________________________________________  EKG  ED ECG REPORT I, Jene Every, the attending physician, personally viewed and interpreted this ECG.  Date: 11/15/2018 Rate: Tachycardia  QRS Axis: Abnormal Intervals: Abnormal ST/T Wave  abnormalities: Nonspecific changes Narrative Interpretation: no evidence  of acute ischemia  ____________________________________________  RADIOLOGY  Chest x-ray, ET tube good position, right lower lobe pneumonia ____________________________________________   PROCEDURES  Procedure(s) performed: yes  Procedure Name: Intubation Date/Time: 02/25/2019 9:40 AM Performed by: Jene EveryKinner, Erminio Nygard, MD Pre-anesthesia Checklist: Patient identified, Patient being monitored, Emergency Drugs available, Timeout performed and Suction available Oxygen Delivery Method: Non-rebreather mask Preoxygenation: Pre-oxygenation with 100% oxygen Induction Type: Rapid sequence Ventilation: Mask ventilation without difficulty Laryngoscope Size: 4 and Glidescope Grade View: Grade III Tube size: 7.5 mm Number of attempts: 2 Placement Confirmation: ETT inserted through vocal cords under direct vision,  Breath sounds checked- equal and bilateral and Positive ETCO2 Secured at: 23 cm Tube secured with: ETT holder Dental Injury: Teeth and Oropharynx as per pre-operative assessment  Difficulty Due To: Difficulty was anticipated        Critical Care performed: yes  CRITICAL CARE Performed by: Jene Everyobert Anber Mckiver   Total critical care time: 45 minutes  Critical care time was exclusive of separately billable procedures and treating other patients.  Critical care was necessary to treat or prevent imminent or life-threatening deterioration.  Critical care was time spent personally by me on the following activities: development of treatment plan with patient and/or surrogate as well as nursing, discussions with consultants, evaluation of patient's response to treatment, examination of patient, obtaining history from patient or surrogate, ordering and performing treatments and interventions, ordering and review of laboratory studies, ordering and review of radiographic studies, pulse oximetry and re-evaluation of patient's condition.  ____________________________________________   INITIAL  IMPRESSION / ASSESSMENT AND PLAN / ED COURSE  Pertinent labs & imaging results that were available during my care of the patient were reviewed by me and considered in my medical decision making (see chart for details).  Patient presents in respiratory distress, history is limited although EMS reports the patient has recently been treated for bronchitis type illness, she does have extensive past medical history as detailed above.  Wheezing on exam with altered mental status suspicious for hypercapnia due to COPD but novel coronavirus is also high on the differential, unclear whether the patient has been febrile.  Community-acquired pneumonia and CHF exacerbation are also on the differential.  Given patient arrived in extremis she required immediate intubation.  She has received Solu-Medrol, pending chest x-ray to confirm tube placement and evaluate for edema versus pneumonia.  We will send coronavirus PCR test.  Patient placed on Precedex drip, labs pending  Patient's white blood cell count is mildly elevated, lactic is elevated at 3.  The patient is febrile.  Suspicious for pneumonia right lower lobe will cover with IV Rocephin and IV azithromycin.  No hypotension, no indication for 30 mils per kilogram  Have discussed with Dr. Sherryll BurgerShah of the hospitalist service for admission    ____________________________________________   FINAL CLINICAL IMPRESSION(S) / ED DIAGNOSES  Final diagnoses:  Acute respiratory failure with hypoxia and hypercapnia (HCC)  Community acquired pneumonia of right lower lobe of lung (HCC)        Note:  This document was prepared using Dragon voice recognition software and may include unintentional dictation errors.   Jene EveryKinner, Rosilyn Coachman, MD 06/04/2019 1043

## 2018-11-05 NOTE — ED Notes (Signed)
ED TO INPATIENT HANDOFF REPORT  ED Nurse Name and Phone #:  Jae Dire [phone]  R Name/Age/Gender [name] [name] 73 y.o. female Room/Bed: ED06A/ED06A  Code Status   Code Status: Full Code  Home/SNF/Other Home Patient oriented to: self, place, time and situation Is this baseline? Yes   Triage Complete: Triage complete  Chief Complaint Resp Distress  Triage Note Pt came emergency traffic via [facility] to room 6 in resp distress. Hx COPD per EMS. States she was alert and talking in resp distress until EMS [place] off the highway and pt went unresponsive and was breathing about 5 times a minute. EMS began bagging pt. Pt continued to be bagged upon arrival. Pt began speaking a little bit upon arrival and was switched back to a non-rebreather at 15LMP. MD and RT at bedside with this RN to immediately intubate. Clinton Sawyer RN placed a PIV in R hand for this RN to push RIK medications. Unsure how long pt was having difficulty breathing. Unknown if pt wears home oxygen. Unsure if exposure to COVID. Unsure of fever at home.   All clothes were cut off pt. Pt had upper dentures, removed and placed in bag, as well as a pair of glasses.   Pt received 2 duonebs with EMS and  solumedrol IM R deltoid.    Allergies Allergies  Allergen Reactions  . Sulfa Antibiotics Itching    Pt states she gets "itching inside and out"  . Cymbalta [Duloxetine Hcl] Nausea Only    Not allergic  . Tramadol Hcl Other (See Comments)    Not allergic    Level of Care/Admitting Diagnosis ED Disposition    ED Disposition Condition Comment   Admit  Hospital Area: [facility] [[id number]]  Level of Care: ICU [6]  Diagnosis: Pneumonia [227785]  Admitting Physician: Milagros Loll [[id number]]  Attending Physician: Milagros Loll [[id number]]  Estimated length of stay: past midnight tomorrow  Certification:: I certify this patient will need inpatient services for at least 2 midnights  PT Class (Do Not Modify):  Inpatient [101]  PT Acc Code (Do Not Modify): Private [1]       B Medical/Surgery History Past Medical History:  Diagnosis Date  . Asthma   . Chronic combined systolic (congestive) and diastolic (congestive) heart failure (HCC)    a. 02/2017 Echo: EF 25-30%, Gr2 DD.  Marland Kitchen COPD (chronic obstructive pulmonary disease) (HCC)    a. On supplemental O2 @ home.  Marland Kitchen Hypertension   . Lung nodule   . Narrow complex tachycardia (HCC)    a. 02/2017 - ? NSVT vs Afib-->placed on amio.  No OAC.  Marland Kitchen NICM (nonischemic cardiomyopathy) (HCC)    a. 02/2017 Echo: Ef 25-30%, mild conc LVH, Gr2 DD, sev MR, mod dil LA;  b. 02/2016 Cath [facility]): nonobs dzs.  . Non-obstructive CAD    a. 02/2016 Cath [facility]): LM 30, LAD 20ost/p, 21m, 30d, D1 50p, D2 30, D3 20, RI 20, LCX 40p, OM1 30, OM2 20, RCA 20 diffuse, RPL2 30, RPL3 30.  Marland Kitchen Severe mitral regurgitation    a. 02/2017 Echo: Sev MR.   Past Surgical History:  Procedure Laterality Date  . REPLACEMENT TOTAL KNEE    . TYMPANOSTOMY TUBE PLACEMENT Bilateral 03/19/2017     A IV Location/Drains/Wounds Patient Lines/Drains/Airways Status   Active Line/Drains/Airways    Name:   Placement date:   Placement time:   Site:   Days:   Peripheral IV 11/23/2018 Left Arm   11/02/2018    0934  Arm   less than 1   Peripheral IV 01-28-2019 Right Arm   01-28-2019    0935    Arm   less than 1   Peripheral IV 01-28-2019 Right Hand   01-28-2019    0935    Hand   less than 1   NG/OG Tube Orogastric 16 Fr. Center mouth Xray;Aucultation Documented cm marking at nare/ corner of mouth 58 cm   01-28-2019    0938    Center mouth   less than 1   Urethral Catheter Jae DireKate RN Latex 16 Fr.   01-28-2019    0957    Latex   less than 1   Airway 7.5 mm   01-28-2019    0900     less than 1          Intake/Output Last 24 hours  Intake/Output Summary (Last 24 hours) at 2019/06/12 1154 Last data filed at 2019/06/12 1137 Gross per 24 hour  Intake 76.03 ml  Output -  Net 76.03 ml    Labs/Imaging Results for orders  placed or performed during the hospital encounter of 01-28-2019 (from the past 48 hour(s))  Blood gas, arterial     Status: Abnormal   Collection Time: 01-28-2019  9:13 AM  Result Value Ref Range   FIO2 1.00    Mode ASSIST CONTROL    VT 500 mL   LHR 18 resp/min   Peep/cpap 5.0 cm H20   pH, Arterial 7.31 (L) 7.350 - 7.450   pCO2 arterial 64 (H) 32.0 - 48.0 mmHg   pO2, Arterial 302 (H) 83.0 - 108.0 mmHg   Bicarbonate 32.2 (H) 20.0 - 28.0 mmol/L   Acid-Base Excess 4.0 (H) 0.0 - 2.0 mmol/L   O2 Saturation 99.9 %   Patient temperature 37.0    Collection site REVIEWED BY    Sample type ARTERIAL DRAW    Allens test (pass/fail) PASS PASS    Comment: Performed at [facility], [address] [place], [place] [address]  Lactic acid, plasma     Status: Abnormal   Collection Time: 01-28-2019  9:21 AM  Result Value Ref Range   Lactic Acid, Venous 3.0 (HH) 0.5 - 1.9 mmol/L    Comment: CRITICAL RESULT CALLED TO, READ BACK BY AND VERIFIED WITH BRANDY DAVIS AT 1021 2019/06/12.PMF Performed at [facility], [address] [place], [place] [address]   Comprehensive metabolic panel     Status: Abnormal   Collection Time: 01-28-2019  9:21 AM  Result Value Ref Range   Sodium 143 135 - 145 mmol/L   Potassium 4.9 3.5 - 5.1 mmol/L   Chloride 102 98 - 111 mmol/L   CO2 30 22 - 32 mmol/L   Glucose, Bld 203 (H) 70 - 99 mg/dL   BUN 22 8 - 23 mg/dL   Creatinine, Ser 8.291.27 (H) 0.44 - 1.00 mg/dL   Calcium 8.2 (L) 8.9 - 10.3 mg/dL   Total Protein 6.7 6.5 - 8.1 g/dL   Albumin 3.3 (L) 3.5 - 5.0 g/dL   AST 22 15 - 41 U/L   ALT 21 0 - 44 U/L   Alkaline Phosphatase 60 38 - 126 U/L   Total Bilirubin 0.7 0.3 - 1.2 mg/dL   GFR calc non Af Amer 42 (L) >60 mL/min   GFR calc Af Amer 48 (L) >60 mL/min   Anion gap 11 5 - 15    Comment: Performed at [facility], [address] [place], [place] [address]  CBC WITH DIFFERENTIAL     Status: Abnormal   Collection Time: Nov 30, 2018  9:21 AM  Result  Value Ref Range   WBC 10.8 (H) 4.0 - 10.5 K/uL   RBC 4.31 3.87 - 5.11 MIL/uL   Hemoglobin 12.2 12.0 - 15.0 g/dL   HCT 16.1 09.6 - 04.5 %   MCV 96.1 80.0 - 100.0 fL   MCH 28.3 26.0 - 34.0 pg   MCHC 29.5 (L) 30.0 - 36.0 g/dL   RDW 40.9 (H) 81.1 - 91.4 %   Platelets 144 (L) 150 - 400 K/uL   nRBC 0.0 0.0 - 0.2 %   Neutrophils Relative % 80 %   Neutro Abs 8.7 (H) 1.7 - 7.7 K/uL   Lymphocytes Relative 11 %   Lymphs Abs 1.2 0.7 - 4.0 K/uL   Monocytes Relative 7 %   Monocytes Absolute 0.7 0.1 - 1.0 K/uL   Eosinophils Relative 1 %   Eosinophils Absolute 0.1 0.0 - 0.5 K/uL   Basophils Relative 0 %   Basophils Absolute 0.0 0.0 - 0.1 K/uL   Immature Granulocytes 1 %   Abs Immature Granulocytes 0.06 0.00 - 0.07 K/uL    Comment: Performed at [facility], [address] [place], [place] [address]  Procalcitonin     Status: None   Collection Time: Nov 30, 2018  9:21 AM  Result Value Ref Range   Procalcitonin 0.54 ng/mL    Comment:        Interpretation: PCT > 0.5 ng/mL and <= 2 ng/mL: Systemic infection (sepsis) is possible, but other conditions are known to elevate PCT as well. (NOTE)       Sepsis PCT Algorithm           Lower Respiratory Tract                                      Infection PCT Algorithm    ----------------------------     ----------------------------         PCT < 0.25 ng/mL                PCT < 0.10 ng/mL         Strongly encourage             Strongly discourage   discontinuation of antibiotics    initiation of antibiotics    ----------------------------     -----------------------------       PCT 0.25 - 0.50 ng/mL            PCT 0.10 - 0.25 ng/mL               OR       >80% decrease in PCT            Discourage initiation of                                            antibiotics      Encourage discontinuation           of antibiotics    ----------------------------     -----------------------------         PCT >= 0.50 ng/mL              PCT 0.26 - 0.50  ng/mL  AND       <80% decrease in PCT             Encourage initiation of                                             antibiotics       Encourage continuation           of antibiotics    ----------------------------     -----------------------------        PCT >= 0.50 ng/mL                  PCT > 0.50 ng/mL               AND         increase in PCT                  Strongly encourage                                      initiation of antibiotics    Strongly encourage escalation           of antibiotics                                     -----------------------------                                           PCT <= 0.25 ng/mL                                                 OR                                        > 80% decrease in PCT                                     Discontinue / Do not initiate                                             antibiotics Performed at [facility], [address] [place], [place] [address]   Brain natriuretic peptide - IF patient is dyspneic     Status: Abnormal   Collection Time: 11/09/2018  9:21 AM  Result Value Ref Range   B Natriuretic Peptide 545.0 (H) 0.0 - 100.0 pg/mL    Comment: Performed at [facility], [address], [place], [place] [address]   Dg Abdomen 1 View  Result Date: 11/07/2018 CLINICAL DATA:  OG tube placement at bedside. EXAM: ABDOMEN - 1 VIEW COMPARISON:  None. FINDINGS: OG tube tip projects over the mid body  of the stomach. Visualized LEFT UPPER QUADRANT abdominal bowel gas pattern is normal. IMPRESSION: OG tube tip projects over the mid body of the stomach in satisfactory position. Electronically Signed   By: Hulan Saas M.D.   On: 11/15/18 10:08   Dg Chest Port 1 View  Result Date: Nov 15, 2018 CLINICAL DATA:  Intubation. Current history of oxygen-dependent COPD, asthma and combined systolic and diastolic CHF. EXAM: PORTABLE CHEST 1 VIEW COMPARISON:  08/02/2018 and earlier, including  CTA chest 05/28/2018. FINDINGS: Endotracheal tube tip in satisfactory position projecting approximately 3 cm above the carina. Gastric tube courses below the diaphragm into the stomach. Cardiac silhouette mildly to moderately enlarged, unchanged. Low lung volumes. Pulmonary venous hypertension and perhaps minimal interstitial pulmonary edema. Asymmetric airspace opacities at the RIGHT lung base. No visible pleural effusions. IMPRESSION: 1. Endotracheal tube tip in satisfactory position projecting approximately 3 cm above the carina. 2. Gastric tube courses below the diaphragm into the stomach. 3. Stable cardiomegaly. Pulmonary venous hypertension and perhaps minimal interstitial pulmonary edema. 4. Asymmetric airspace pulmonary edema versus pneumonia at the RIGHT lung base. Electronically Signed   By: Hulan Saas M.D.   On: November 15, 2018 10:07    Pending Labs Unresulted Labs (From admission, onward)    Start     Ordered   11/12/18 0500  Creatinine, serum  (enoxaparin (LOVENOX)    CrCl >/= 30 ml/min)  Weekly,   STAT    Comments:  while on enoxaparin therapy    11-15-18 1141   11/06/18 0500  Basic metabolic panel  Tomorrow morning,   STAT     Nov 15, 2018 1141   11/06/18 0500  CBC  Tomorrow morning,   STAT     15-Nov-2018 1141   11-15-2018 1144  Magnesium  Add-on,   AD     2018/11/15 1143   Nov 15, 2018 1140  Hemoglobin A1c  Add-on,   AD    Comments:  To assess prior glycemic control    2018/11/15 1141   Nov 15, 2018 0938  Novel Coronavirus, NAA (hospital order; send-out to ref lab)  (Novel Coronavirus, NAA [facility] Order; send-out to ref lab) with precautions panel)  Once,   STAT    Question Answer Comment  Current symptoms Fever and Shortness of breath   Excluded other viral illnesses No (testing not indicated)   Patient immune status Normal      2018/11/15 0937   11-15-2018 0929  Lactic acid, plasma  STAT Now then every 3 hours,   STAT     11/15/18 0929   11/15/2018 0929  Blood Culture (routine x 2)  BLOOD  CULTURE X 2,   STAT     11-15-18 0929   11-15-2018 0929  Urine culture  ONCE - STAT,   STAT     Nov 15, 2018 0929   15-Nov-2018 0929  Urinalysis, Complete w Microscopic  ONCE - STAT,   STAT     15-Nov-2018 0929          Vitals/Pain Today's Vitals   15-Nov-2018 1120 11/15/18 1130 15-Nov-2018 1140 2018-11-15 1150  BP: 95/62 (!)  Pulse: 82  77 76  Resp: 19 (!) 21 (!) 21 (!) 21  Temp:      TempSrc:      SpO2: 99%  100% 100%  Weight:      Height:      PainSc:        Isolation Precautions Droplet and Contact precautions  Medications Medications  dexmedetomidine (PRECEDEX) 400 MCG/100ML (4 mcg/mL) infusion (0.6 mcg/kg/hr  Intravenous Rate/Dose Verify 11/14/2018 1137)  azithromycin (ZITHROMAX) 500 mg in sodium chloride 0.9 % 250 mL IVPB ( Intravenous Rate/Dose Verify 11/11/2018 1100)  sodium chloride 0.9 % bolus 1,000 mL (1,000 mLs Intravenous New Bag/Given 11/12/2018 1118)    And  sodium chloride 0.9 % bolus 1,000 mL (has no administration in time range)  sodium chloride 0.9 % bolus 1,000 mL (has no administration in time range)    And  sodium chloride 0.9 % bolus 1,000 mL (has no administration in time range)    And  sodium chloride 0.9 % bolus 500 mL (has no administration in time range)  insulin aspart (novoLOG) injection 0-9 Units (has no administration in time range)  enoxaparin (LOVENOX) injection 40 mg (has no administration in time range)  acetaminophen (TYLENOL) tablet 650 mg (has no administration in time range)    Or  acetaminophen (TYLENOL) suppository 650 mg (has no administration in time range)  ondansetron (ZOFRAN) tablet 4 mg (has no administration in time range)    Or  ondansetron (ZOFRAN) injection 4 mg (has no administration in time range)  albuterol (PROVENTIL) (2.5 MG/3ML) 0.083% nebulizer solution 2.5 mg (has no administration in time range)  etomidate (AMIDATE) injection 20 mg (20 mg Intravenous Given 11/20/2018 0855)  succinylcholine (ANECTINE) injection 100 mg  (100 mg Intravenous Given 11/23/2018 0856)  fentaNYL (SUBLIMAZE) injection 50 mcg (50 mcg Intravenous Given 11/06/2018 0905)  LORazepam (ATIVAN) injection 2 mg ( Intravenous Given 11/02/2018 0900)  cefTRIAXone (ROCEPHIN) 1 g in sodium chloride 0.9 % 100 mL IVPB (0 g Intravenous Stopped 11/06/2018 1051)  0.9 %  sodium chloride infusion (0 mLs Intravenous Stopped 10/27/2018 1128)    Mobility walks High fall risk   Focused Assessments Cardiac Assessment Handoff:  Cardiac Rhythm: Normal sinus rhythm Lab Results  Component Value Date   CKTOTAL 57 10/23/2014   CKMB 1.8 10/23/2014   TROPONINI <0.03 08/02/2018   No results found for: DDIMER Does the Patient currently have chest pain? No     R Recommendations: See Admitting Provider Note  Report given to:   Additional Notes:

## 2018-11-05 NOTE — H&P (Signed)
[facility] - [place] [facility]   PATIENT NAME: [name] [name]    MR#:  [id number]  DATE OF BIRTH:  04/12/1946  DATE OF ADMISSION:  10/27/2018  PRIMARY CARE PHYSICIAN: Barbette ReichmannHande, Vishwanath, MD   REQUESTING/REFERRING PHYSICIAN: Dr. Cyril LoosenKinner  CHIEF COMPLAINT:   Chief Complaint  Patient presents with  . Respiratory Arrest    HISTORY OF PRESENT ILLNESS:  [name] [name]  is a 73 y.o. female with a known history of COPD, chronic systolic CHF with ejection fraction 25 to 30%, nonischemic cardiomyopathy, severe mitral regurgitation, hypertension, lung nodules presents to the hospital brought in by EMS due to worsening shortness of breath.  Patient had called EMS and by time they arrived on the scene patient was breathing 5 times a minute.  They continue to bag her brought to the emergency room where she was more awake.  But with severe shortness of breath and drowsiness patient was intubated.  Found to be febrile with bilateral infiltrates on the chest x-ray.  Patient's last blood pressure was 90/60.  She had one episode of nonsustained ventricular tachycardia in the emergency room.  Presently she is sedated with Precedex.  No recent hospitalizations.  Started on azithromycin and ceftriaxone.   PAST MEDICAL HISTORY:   Past Medical History:  Diagnosis Date  . Asthma   . Chronic combined systolic (congestive) and diastolic (congestive) heart failure (HCC)    a. 02/2017 Echo: EF 25-30%, Gr2 DD.  Marland Kitchen. COPD (chronic obstructive pulmonary disease) (HCC)    a. On supplemental O2 @ home.  Marland Kitchen. Hypertension   . Lung nodule   . Narrow complex tachycardia (HCC)    a. 02/2017 - ? NSVT vs Afib-->placed on amio.  No OAC.  Marland Kitchen. NICM (nonischemic cardiomyopathy) (HCC)    a. 02/2017 Echo: Ef 25-30%, mild conc LVH, Gr2 DD, sev MR, mod dil LA;  b. 02/2016 Cath [facility]): nonobs dzs.  . Non-obstructive CAD    a. 02/2016 Cath [facility]): LM 30, LAD 20ost/p, 1530m, 30d, D1 50p, D2 30, D3 20, RI 20, LCX 40p, OM1 30, OM2 20,  RCA 20 diffuse, RPL2 30, RPL3 30.  Marland Kitchen. Severe mitral regurgitation    a. 02/2017 Echo: Sev MR.    PAST SURGICAL HISTORY:   Past Surgical History:  Procedure Laterality Date  . REPLACEMENT TOTAL KNEE    . TYMPANOSTOMY TUBE PLACEMENT Bilateral 03/19/2017    SOCIAL HISTORY:   Social History   Tobacco Use  . Smoking status: Former Smoker    Packs/day: 3.00    Years: 55.00    Pack years: 165.00    Types: Cigarettes    Last attempt to quit: 2018    Years since quitting: 2.2  . Smokeless tobacco: Never Used  . Tobacco comment: smoking cessation materials not required  Substance Use Topics  . Alcohol use: No    FAMILY HISTORY:   Family History  Problem Relation Age of Onset  . Heart disease Mother   . Heart disease Sister   . Cancer Sister     DRUG ALLERGIES:   Allergies  Allergen Reactions  . Sulfa Antibiotics Itching    Pt states she gets "itching inside and out"  . Cymbalta [Duloxetine Hcl] Nausea Only    Not allergic  . Tramadol Hcl Other (See Comments)    Not allergic    REVIEW OF SYSTEMS:   Review of Systems  Unable to perform ROS: Intubated    MEDICATIONS AT HOME:   Prior to Admission medications  Medication Sig Start Date End Date Taking? Authorizing Provider  acetaminophen (TYLENOL) 500 MG tablet Take 2 tablets (1,000 mg total) by mouth 3 (three) times daily. 08/02/17  Yes Plonk, Chrissie Noa, MD  albuterol (PROVENTIL HFA;VENTOLIN HFA) 108 (90 Base) MCG/ACT inhaler Inhale 2 puffs into the lungs every 6 (six) hours as needed for wheezing or shortness of breath. 12/07/17  Yes Duanne Limerick, MD  apixaban (ELIQUIS) 5 MG TABS tablet Take 5 mg by mouth 2 (two) times daily. 11/01/18  Yes [provider]  atorvastatin (LIPITOR) 40 MG tablet Take 1 tablet (40 mg total) by mouth daily. 12/07/17  Yes Duanne Limerick, MD  B Complex Vitamins (B-COMPLEX/B-12) TABS Take 1 tablet by mouth daily.   Yes [provider]  benzonatate (TESSALON) 200 MG capsule  Take 1 capsule (200 mg total) by mouth 3 (three) times daily as needed for cough. 08/04/18  Yes Salary, Evelena Asa, MD  carvedilol (COREG) 25 MG tablet Take 1 tablet (25 mg total) by mouth 2 (two) times daily. Patient taking differently: Take 12.5 mg by mouth 2 (two) times daily.  12/07/17  Yes Duanne Limerick, MD  cholecalciferol (VITAMIN D3) 25 MCG (1000 UT) tablet Take 1,000 Units by mouth daily.   Yes [provider]  Cyanocobalamin (B-12) 1000 MCG TABS Take 1 tablet by mouth daily. 04/05/17  Yes Plonk, Chrissie Noa, MD  ferrous sulfate 325 (65 FE) MG tablet Take 325 mg by mouth daily with breakfast.   Yes [provider]  gabapentin (NEURONTIN) 600 MG tablet Take 1,200 mg by mouth 2 (two) times daily. 04/28/18  Yes [provider]  ipratropium-albuterol (DUONEB) 0.5-2.5 (3) MG/3ML SOLN Take 3 mLs by nebulization every 6 (six) hours as needed.   Yes [provider]  losartan (COZAAR) 25 MG tablet Take 1 tablet (25 mg total) by mouth daily. 12/07/17  Yes Duanne Limerick, MD  mirtazapine (REMERON) 30 MG tablet Take 30 mg by mouth at bedtime.  03/24/18  Yes [provider]  pantoprazole (PROTONIX) 40 MG tablet Take 40 mg by mouth 2 (two) times daily. 06/03/18  Yes [provider]  predniSONE (DELTASONE) 20 MG tablet Take 2 tablets (40 mg total) by mouth daily with breakfast. 08/05/18  Yes Salary, Montell D, MD  promethazine (PHENERGAN) 25 MG tablet Take 0.5 tablets (12.5 mg total) by mouth every 6 (six) hours as needed for nausea. 08/16/17  Yes Plonk, Chrissie Noa, MD  sertraline (ZOLOFT) 50 MG tablet Take 50 mg by mouth daily. 04/22/18  Yes [provider]  torsemide (DEMADEX) 20 MG tablet Take 20 mg by mouth daily. 04/22/18  Yes [provider]  traMADol (ULTRAM) 50 MG tablet Take 50 mg by mouth 2 (two) times daily.  04/13/18  Yes [provider]  umeclidinium-vilanterol (ANORO ELLIPTA) 62.5-25 MCG/INH AEPB Inhale 1 puff into the lungs  daily. 08/18/18  Yes [provider]     VITAL SIGNS:  Blood pressure 90/61, pulse 77, temperature (S) (!) 100.4 F (38 C), temperature source Axillary, resp. rate (!) 21, height 5\' 8"  (1.727 m), weight 105 kg, SpO2 100 %.  PHYSICAL EXAMINATION:  Physical Exam  GENERAL:  73 y.o.-year-old patient lying in the bed.  Intubated and looks critically ill EYES: Pupils equal, round, reactive to light and accommodation. No scleral icterus.  ET tube in place HEENT: Head atraumatic, normocephalic. Oropharynx and nasopharynx clear. No oropharyngeal erythema, moist oral mucosa  NECK:  Supple, no jugular venous distention. No thyroid enlargement, no  tenderness.  LUNGS: Normal breath sounds bilaterally, no wheezing, rales, CARDIOVASCULAR: S1, S2 normal. No murmurs, rubs, or gallops.  ABDOMEN: Soft,  nondistended. Bowel sounds present. No organomegaly or mass.  EXTREMITIES: No pedal edema, cyanosis, or clubbing. + 2 pedal & radial pulses b/l.   PSYCHIATRIC: The patient is sedated Obese  LABORATORY PANEL:   CBC Recent Labs  Lab 11/09/18 0921  WBC 10.8*  HGB 12.2  HCT 41.4  PLT 144*   ------------------------------------------------------------------------------------------------------------------  Chemistries  Recent Labs  Lab 11-09-2018 0921  NA 143  K 4.9  CL 102  CO2 30  GLUCOSE 203*  BUN 22  CREATININE 1.27*  CALCIUM 8.2*  AST 22  ALT 21  ALKPHOS 60  BILITOT 0.7   ------------------------------------------------------------------------------------------------------------------  Cardiac Enzymes No results for input(s): TROPONINI in the last 168 hours. ------------------------------------------------------------------------------------------------------------------  RADIOLOGY:  Dg Abdomen 1 View  Result Date: 2018/11/09 CLINICAL DATA:  OG tube placement at bedside. EXAM: ABDOMEN - 1 VIEW COMPARISON:  None. FINDINGS: OG tube tip projects over the mid body of the  stomach. Visualized LEFT UPPER QUADRANT abdominal bowel gas pattern is normal. IMPRESSION: OG tube tip projects over the mid body of the stomach in satisfactory position. Electronically Signed   By: Hulan Saas M.D.   On: Nov 09, 2018 10:08   Dg Chest Port 1 View  Result Date: 2018-11-09 CLINICAL DATA:  Intubation. Current history of oxygen-dependent COPD, asthma and combined systolic and diastolic CHF. EXAM: PORTABLE CHEST 1 VIEW COMPARISON:  08/02/2018 and earlier, including CTA chest 05/28/2018. FINDINGS: Endotracheal tube tip in satisfactory position projecting approximately 3 cm above the carina. Gastric tube courses below the diaphragm into the stomach. Cardiac silhouette mildly to moderately enlarged, unchanged. Low lung volumes. Pulmonary venous hypertension and perhaps minimal interstitial pulmonary edema. Asymmetric airspace opacities at the RIGHT lung base. No visible pleural effusions. IMPRESSION: 1. Endotracheal tube tip in satisfactory position projecting approximately 3 cm above the carina. 2. Gastric tube courses below the diaphragm into the stomach. 3. Stable cardiomegaly. Pulmonary venous hypertension and perhaps minimal interstitial pulmonary edema. 4. Asymmetric airspace pulmonary edema versus pneumonia at the RIGHT lung base. Electronically Signed   By: Hulan Saas M.D.   On: 11-09-18 10:07     IMPRESSION AND PLAN:   *Bilateral community-acquired pneumonia with acute hypoxic respiratory failure.  Patient is status post intubation and will need full ventilatory support.  Will admit to ICU.  Continue Precedex for sedation. Consult intensivist for further management in the ICU. At this time patient's blood pressure is stable and is getting fluid resuscitation.  May need pressors if blood pressure continues to get worse. COVID-19 test has been sent and pending  *Nonischemic cardiomyopathy with ejection fraction 25%.  High risk for fluid overload.  Monitor.  I have not  ordered any maintenance fluids after the sepsis resuscitation.  *Nonsustained ventricular tachycardia.  High risk for ventricular tachycardia with her cardiomyopathy.  Telemetry monitoring.  *CKD stage III is stable  DVT prophylaxis with Lovenox  All the records are reviewed and case discussed with ED provider. Management plans discussed with the patient, family and they are in agreement.  CODE STATUS: Full code  TOTAL CRITICAL CARE TIME TAKING CARE OF THIS PATIENT: 80 minutes.   Orie Fisherman M.D on November 09, 2018 at 11:45 AM  Between 7am to 6pm - Pager - [phone]  After 6pm go to [email or web address] - password [email or web address] [facility]  [facility]  Office  [phone]  CC: Primary care physician; Barbette Reichmann, MD  Note: This dictation was prepared with Dragon dictation along with smaller phrase technology. Any transcriptional errors that result from this process are unintentional.

## 2018-11-05 NOTE — ED Notes (Signed)
Second lactic drawn and sent to lab at this time.

## 2018-11-05 NOTE — ED Notes (Signed)
Date and time results received: 11/08/2018 1039   Test: lactic acid Critical Value: 3.0  Name of Provider Notified: Dr. Cyril Loosen

## 2018-11-05 NOTE — ED Notes (Signed)
Pt had soft brown BM PTA. Was cleaned with wipes prior to foley insertion. Attempted to get pt as clean as possible for sterile procedure.

## 2018-11-05 NOTE — ED Notes (Signed)
X-ray at bedside

## 2018-11-06 ENCOUNTER — Inpatient Hospital Stay: Payer: Medicare Other

## 2018-11-06 DIAGNOSIS — R7881 Bacteremia: Secondary | ICD-10-CM

## 2018-11-06 DIAGNOSIS — J441 Chronic obstructive pulmonary disease with (acute) exacerbation: Secondary | ICD-10-CM

## 2018-11-06 LAB — BLOOD CULTURE ID PANEL (REFLEXED)
Acinetobacter baumannii: NOT DETECTED
Acinetobacter baumannii: NOT DETECTED
Candida albicans: NOT DETECTED
Candida albicans: NOT DETECTED
Candida glabrata: NOT DETECTED
Candida glabrata: NOT DETECTED
Candida krusei: NOT DETECTED
Candida krusei: NOT DETECTED
Candida parapsilosis: NOT DETECTED
Candida parapsilosis: NOT DETECTED
Candida tropicalis: NOT DETECTED
Candida tropicalis: NOT DETECTED
Carbapenem resistance: NOT DETECTED
Enterobacter cloacae complex: NOT DETECTED
Enterobacter cloacae complex: NOT DETECTED
Enterobacteriaceae species: DETECTED — AB
Enterobacteriaceae species: NOT DETECTED
Enterococcus species: NOT DETECTED
Enterococcus species: NOT DETECTED
Escherichia coli: DETECTED — AB
Escherichia coli: NOT DETECTED
Haemophilus influenzae: NOT DETECTED
Haemophilus influenzae: NOT DETECTED
Klebsiella oxytoca: NOT DETECTED
Klebsiella oxytoca: NOT DETECTED
Klebsiella pneumoniae: NOT DETECTED
Klebsiella pneumoniae: NOT DETECTED
Listeria monocytogenes: NOT DETECTED
Listeria monocytogenes: NOT DETECTED
Methicillin resistance: DETECTED — AB
Neisseria meningitidis: NOT DETECTED
Neisseria meningitidis: NOT DETECTED
Proteus species: NOT DETECTED
Proteus species: NOT DETECTED
Pseudomonas aeruginosa: NOT DETECTED
Pseudomonas aeruginosa: NOT DETECTED
Serratia marcescens: NOT DETECTED
Serratia marcescens: NOT DETECTED
Staphylococcus aureus (BCID): NOT DETECTED
Staphylococcus aureus (BCID): NOT DETECTED
Staphylococcus species: NOT DETECTED
Staphylococcus species: DETECTED — AB
Streptococcus agalactiae: NOT DETECTED
Streptococcus agalactiae: NOT DETECTED
Streptococcus pneumoniae: NOT DETECTED
Streptococcus pneumoniae: NOT DETECTED
Streptococcus pyogenes: NOT DETECTED
Streptococcus pyogenes: NOT DETECTED
Streptococcus species: NOT DETECTED
Streptococcus species: NOT DETECTED

## 2018-11-06 LAB — URINE CULTURE: Culture: NO GROWTH

## 2018-11-06 LAB — CBC
HCT: 33.2 % — ABNORMAL LOW (ref 36.0–46.0)
Hemoglobin: 10 g/dL — ABNORMAL LOW (ref 12.0–15.0)
MCH: 28.7 pg (ref 26.0–34.0)
MCHC: 30.1 g/dL (ref 30.0–36.0)
MCV: 95.4 fL (ref 80.0–100.0)
Platelets: 108 10*3/uL — ABNORMAL LOW (ref 150–400)
RBC: 3.48 MIL/uL — ABNORMAL LOW (ref 3.87–5.11)
RDW: 15.9 % — ABNORMAL HIGH (ref 11.5–15.5)
WBC: 10.6 10*3/uL — ABNORMAL HIGH (ref 4.0–10.5)
nRBC: 0 % (ref 0.0–0.2)

## 2018-11-06 LAB — BASIC METABOLIC PANEL
Anion gap: 11 (ref 5–15)
BUN: 20 mg/dL (ref 8–23)
CO2: 24 mmol/L (ref 22–32)
Calcium: 7.2 mg/dL — ABNORMAL LOW (ref 8.9–10.3)
Chloride: 110 mmol/L (ref 98–111)
Creatinine, Ser: 0.89 mg/dL (ref 0.44–1.00)
GFR calc Af Amer: >60 mL/min (ref >60)
GFR calc non Af Amer: >60 mL/min (ref >60)
Glucose, Bld: 149 mg/dL — ABNORMAL HIGH (ref 70–99)
Potassium: 4.4 mmol/L (ref 3.5–5.1)
Sodium: 145 mmol/L (ref 135–145)

## 2018-11-06 LAB — GLUCOSE, CAPILLARY
Glucose-Capillary: 144 mg/dL — ABNORMAL HIGH (ref 70–99)
Glucose-Capillary: 154 mg/dL — ABNORMAL HIGH (ref 70–99)
Glucose-Capillary: 155 mg/dL — ABNORMAL HIGH (ref 70–99)
Glucose-Capillary: 155 mg/dL — ABNORMAL HIGH (ref 70–99)
Glucose-Capillary: 173 mg/dL — ABNORMAL HIGH (ref 70–99)
Glucose-Capillary: 194 mg/dL — ABNORMAL HIGH (ref 70–99)

## 2018-11-06 LAB — TROPONIN I: Troponin I: 0.08 ng/mL (ref <0.03)

## 2018-11-06 LAB — BRAIN NATRIURETIC PEPTIDE: B Natriuretic Peptide: 570 pg/mL — ABNORMAL HIGH (ref 0.0–100.0)

## 2018-11-06 MED ORDER — MIDAZOLAM HCL 2 MG/2ML IJ SOLN
1.0000 mg | INTRAMUSCULAR | Status: DC | PRN
  Administered 2018-11-06 – 2018-11-08 (×7): 2 mg via INTRAVENOUS
  Filled 2018-11-06 (×6): qty 2

## 2018-11-06 MED ORDER — SODIUM CHLORIDE 0.9 % IV SOLN
1.0000 g | Freq: Two times a day (BID) | INTRAVENOUS | Status: DC
  Administered 2018-11-06: 1 g via INTRAVENOUS
  Filled 2018-11-06 (×2): qty 1

## 2018-11-06 MED ORDER — VANCOMYCIN HCL 10 G IV SOLR
2000.0000 mg | Freq: Once | INTRAVENOUS | Status: AC
  Administered 2018-11-06: 2000 mg via INTRAVENOUS
  Filled 2018-11-06: qty 2000

## 2018-11-06 MED ORDER — SODIUM CHLORIDE 0.9 % IV SOLN
2.0000 g | INTRAVENOUS | Status: DC
  Administered 2018-11-06: 12:00:00 2 g via INTRAVENOUS
  Filled 2018-11-06: qty 20
  Filled 2018-11-06: qty 2

## 2018-11-06 MED ORDER — MIDAZOLAM HCL 2 MG/2ML IJ SOLN: 2.0000 mg | Freq: Once | INTRAMUSCULAR | Status: DC

## 2018-11-06 MED ORDER — SODIUM CHLORIDE 0.9 % IV SOLN
1.0000 g | Freq: Three times a day (TID) | INTRAVENOUS | Status: DC
  Administered 2018-11-06 – 2018-11-11 (×15): 1 g via INTRAVENOUS
  Filled 2018-11-06 (×17): qty 1

## 2018-11-06 MED ORDER — SODIUM CHLORIDE 0.9 % IV SOLN
1.0000 g | INTRAVENOUS | Status: DC
  Filled 2018-11-06: qty 10

## 2018-11-06 MED ORDER — STERILE WATER FOR INJECTION IJ SOLN
INTRAMUSCULAR | Status: AC
  Administered 2018-11-06: 08:00:00
  Filled 2018-11-06: qty 10

## 2018-11-06 MED ORDER — VANCOMYCIN HCL 1000 MG IV SOLR
1000.0000 mg | Freq: Two times a day (BID) | INTRAVENOUS | Status: DC
  Filled 2018-11-06 (×2): qty 1000

## 2018-11-06 MED ORDER — DEXTROSE 5 % IV SOLN
INTRAVENOUS | Status: DC
  Administered 2018-11-06: 10:00:00 via INTRAVENOUS

## 2018-11-06 MED ORDER — MIDAZOLAM HCL 2 MG/2ML IJ SOLN
INTRAMUSCULAR | Status: AC
  Administered 2018-11-06: 2 mg via INTRAVENOUS
  Filled 2018-11-06: qty 2

## 2018-11-06 MED ORDER — VANCOMYCIN HCL IN DEXTROSE 1-5 GM/200ML-% IV SOLN
1000.0000 mg | Freq: Two times a day (BID) | INTRAVENOUS | Status: DC
  Filled 2018-11-06 (×2): qty 200

## 2018-11-06 NOTE — Progress Notes (Signed)
PHARMACY - PHYSICIAN COMMUNICATION CRITICAL VALUE ALERT - BLOOD CULTURE IDENTIFICATION (BCID)  [name] [name] is an 73 y.o. female who presented to [facility] on 2018/11/18 with a chief complaint of respiratory arrest s/p intubation  Assessment:  Tmax 101.9, WBC 10.8, LA 3.0, CXR cardiomegaly, 1/4 GNR BCID Enterobacteriacaea E. Coli KPC -; Patient is now growing 1/4 GPC BCID Staph species MecA+ (MRSA)  Name of physician (or Provider) Contacted: Sonda Rumble  Current antibiotics: azithromycin/meropenem  Changes to prescribed antibiotics recommended:  Recommendations accepted by provider -- will add vancomycin per pharmacy to dose: Will load w/ vanc 2g IV  Vancomycin 1000 mg IV Q 12 hrs. Goal AUC 400-550. Expected AUC: 537.1 SCr used: 1.27 Cssmin: 15.7  Results for orders placed or performed during the hospital encounter of 11-18-2018  Blood Culture ID Panel (Reflexed) (Collected: 2018-11-18  9:34 AM)  Result Value Ref Range   Enterococcus species NOT DETECTED NOT DETECTED   Listeria monocytogenes NOT DETECTED NOT DETECTED   Staphylococcus species DETECTED (A) NOT DETECTED   Staphylococcus aureus (BCID) NOT DETECTED NOT DETECTED   Methicillin resistance DETECTED (A) NOT DETECTED   Streptococcus species NOT DETECTED NOT DETECTED   Streptococcus agalactiae NOT DETECTED NOT DETECTED   Streptococcus pneumoniae NOT DETECTED NOT DETECTED   Streptococcus pyogenes NOT DETECTED NOT DETECTED   Acinetobacter baumannii NOT DETECTED NOT DETECTED   Enterobacteriaceae species NOT DETECTED NOT DETECTED   Enterobacter cloacae complex NOT DETECTED NOT DETECTED   Escherichia coli NOT DETECTED NOT DETECTED   Klebsiella oxytoca NOT DETECTED NOT DETECTED   Klebsiella pneumoniae NOT DETECTED NOT DETECTED   Proteus species NOT DETECTED NOT DETECTED   Serratia marcescens NOT DETECTED NOT DETECTED   Haemophilus influenzae NOT DETECTED NOT DETECTED   Neisseria meningitidis NOT DETECTED NOT DETECTED   Pseudomonas aeruginosa NOT DETECTED NOT DETECTED   Candida albicans NOT DETECTED NOT DETECTED   Candida glabrata NOT DETECTED NOT DETECTED   Candida krusei NOT DETECTED NOT DETECTED   Candida parapsilosis NOT DETECTED NOT DETECTED   Candida tropicalis NOT DETECTED NOT DETECTED   Thomasene Ripple, PharmD, BCPS Clinical Pharmacist 11/06/2018

## 2018-11-06 NOTE — Plan of Care (Signed)
Patient remains on ventilator.  Tolerating suction and care. Continues on propofol at this time.  No acute changes.  Fair urine output.  Will continue to monitor.

## 2018-11-06 NOTE — Progress Notes (Signed)
Pulled ETT back 2cm per MD order

## 2018-11-06 NOTE — Progress Notes (Signed)
Pharmacy Antibiotic Note  [name] [name] is a 73 y.o. female admitted on 11/02/2018 with bacteremia.  Pharmacy has been consulted for Meropenem dosing.  Plan: Meropenem 1g Q 8 hours as the Patient's renal function has improved, SCr is now 0.89 mg/dL and [name]=63.1 ml/min  Height: 5\' 5"  (165.1 cm) Weight: 248 lb 14.4 oz (112.9 kg) IBW/kg (Calculated) : 57  Temp (24hrs), Avg:98.2 F (36.8 C), Min:97.9 F (36.6 C), Max:98.7 F (37.1 C)  Recent Labs  Lab 11/24/2018 0921 11/17/2018 1210 11/06/18 0451  WBC 10.8*  --  10.6*  CREATININE 1.27*  --  0.89  LATICACIDVEN 3.0* 1.2  --     Estimated Creatinine Clearance: 69.5 mL/min (by C-G formula based on SCr of 0.89 mg/dL).    Allergies  Allergen Reactions  . Sulfa Antibiotics Itching    Pt states she gets "itching inside and out"  . Cymbalta [Duloxetine Hcl] Nausea Only    Not allergic  . Tramadol Hcl Other (See Comments)    Not allergic    Antimicrobials this admission: Meropenem 4/12 >>  Vancomycin 4/12 x 1 dose Rocephin 4/11 x 1 dose Azithromycin 4/11 x 1 dose  Dose adjustments this admission: None to note.  Microbiology results: 4/11 BCx: E.coli(+),MRSA(+) 4/11 UCx: negative   04/11 MRSA PCR: negative  Thank you for allowing pharmacy to be a part of this patient's care.  [name] [name] 11/06/2018 4:36 PM

## 2018-11-06 NOTE — Progress Notes (Signed)
PHARMACY - PHYSICIAN COMMUNICATION CRITICAL VALUE ALERT - BLOOD CULTURE IDENTIFICATION (BCID)  [name] [name] is an 73 y.o. female who presented to [facility] on 11/04/2018 with a chief complaint of respiratory arrest s/p intubation  Assessment:  Tmax 101.9, WBC 10.8, LA 3.0, CXR cardiomegaly, 1/4 GNR BCID Enterobacteriacaea E. Coli KPC -  Name of physician (or Provider) Contacted: Sonda Rumble  Current antibiotics: azithromycin/ceftriaxone  Changes to prescribed antibiotics recommended:  Recommendations accepted by provider -- will discontinue ceftriaxone and start meropenem 1g IV q12h per CrCl < 50 ml/min  Results for orders placed or performed during the hospital encounter of 10/27/2018  Blood Culture ID Panel (Reflexed) (Collected: 11/17/2018  9:22 AM)  Result Value Ref Range   Enterococcus species NOT DETECTED NOT DETECTED   Listeria monocytogenes NOT DETECTED NOT DETECTED   Staphylococcus species NOT DETECTED NOT DETECTED   Staphylococcus aureus (BCID) NOT DETECTED NOT DETECTED   Streptococcus species NOT DETECTED NOT DETECTED   Streptococcus agalactiae NOT DETECTED NOT DETECTED   Streptococcus pneumoniae NOT DETECTED NOT DETECTED   Streptococcus pyogenes NOT DETECTED NOT DETECTED   Acinetobacter baumannii NOT DETECTED NOT DETECTED   Enterobacteriaceae species DETECTED (A) NOT DETECTED   Enterobacter cloacae complex NOT DETECTED NOT DETECTED   Escherichia coli DETECTED (A) NOT DETECTED   Klebsiella oxytoca NOT DETECTED NOT DETECTED   Klebsiella pneumoniae NOT DETECTED NOT DETECTED   Proteus species NOT DETECTED NOT DETECTED   Serratia marcescens NOT DETECTED NOT DETECTED   Carbapenem resistance NOT DETECTED NOT DETECTED   Haemophilus influenzae NOT DETECTED NOT DETECTED   Neisseria meningitidis NOT DETECTED NOT DETECTED   Pseudomonas aeruginosa NOT DETECTED NOT DETECTED   Candida albicans NOT DETECTED NOT DETECTED   Candida glabrata NOT DETECTED NOT DETECTED   Candida  krusei NOT DETECTED NOT DETECTED   Candida parapsilosis NOT DETECTED NOT DETECTED   Candida tropicalis NOT DETECTED NOT DETECTED   Thomasene Ripple, PharmD, BCPS Clinical Pharmacist 11/06/2018

## 2018-11-06 NOTE — Consult Note (Signed)
PCCM ROUNDING NOTE  INITIAL PRESENTATION: 73 y.o. F with history of COPD, dilated cardiomyopathy (LVEF 25-30% by echo 2018), mitral regurgitation presented to [facility] ED via EMS with altered mental status and hypoxemic respiratory failure.  Intubated in ED  MAJOR EVENTS/TEST RESULTS: 04/11 admission as documented above 04/12 failed SBT  INDWELLING DEVICES:: ETT 04/11 >>    MICRO DATA: MRSA PCR 4/11>> negative Urine 4/11 >> NEG Resp 4/11>>  Blood 4/11>> E coli in one bottle (likely real) and coag neg staph in one bottle (likely contaminant)  COVID-19 04/11 >>   PCT 04/11 0.54,   ANTIMICROBIALS:  Azithromycin 04/11 >> 04/12 Ceftriaxone 04/11 >> 04/12 Vancomycin 04/12 X 1 Meropenem 04/12 >>     SUBJ: RASS -4, intubated, sedated.  Synchronous with vent.  Not following commands.  Failed attempt at SBT   OBJ: Vitals:   11/06/18 1000 11/06/18 1100 11/06/18 1200 11/06/18 1346  BP: (!) 122/58 (!) 139/115    Pulse: 70 88  97  Resp: 18 16  (!) 21  Temp:   98.7 F (37.1 C)   TempSrc:      SpO2: 99% 96%  99%  Weight:      Height:        Vent Mode: PRVC FiO2 (%):  [30 %-35 %] 30 % Set Rate:  [15 bmp] 15 bmp Vt Set:  [500 mL] 500 mL PEEP:  [5 cmH20] 5 cmH20 Plateau Pressure:  [19 cmH20-23 cmH20] 23 cmH20   Intake/Output Summary (Last 24 hours) at 11/06/2018 1522 Last data filed at 11/06/2018 1212 Gross per 24 hour  Intake 1199.45 ml  Output 1095 ml  Net 104.45 ml     Gen: RASS -4, intubated, synchronous HEENT: NCAT, sclerae white Neck: No JVD noted Lungs: No wheezes noted anteriorly Cardiovascular: Regular, soft systolic murmur Abdomen: Obese, soft, NT, + BS Ext: Warm, no edema Neuro: Cranial nerves grossly intact, minimal spontaneous movement Skin: Limited exam, no lesions noted   BMP Latest Ref Rng & Units 11/06/2018 10/30/2018 08/03/2018  Glucose 70 - 99 mg/dL 149(H) 203(H) 111(H)  BUN 8 - 23 mg/dL _0 Creatinine 0.44 - 1.00 mg/dL 0.89 1.27(H) 1.07(H)   BUN/Creat Ratio 12 - 28 - - -  Sodium 135 - 145 mmol/L 145 143 140  Potassium 3.5 - 5.1 mmol/L 4.4 4.9 4.1  Chloride 98 - 111 mmol/L 110 102 103  CO2 22 - 32 mmol/L _1 Calcium 8.9 - 10.3 mg/dL 7.2(L) 8.2(L) 8.5(L)    CBC Latest Ref Rng & Units 11/06/2018 11/04/2018 08/03/2018  WBC 4.0 - 10.5 K/uL 10.6(H) 10.8(H) 8.4  Hemoglobin 12.0 - 15.0 g/dL 10.0(L) 12.2 10.4(L)  Hematocrit 36.0 - 46.0 % 33.2(L) 41.4 34.4(L)  Platelets 150 - 400 K/uL 108(L) 144(L) 178    Hepatic Function Latest Ref Rng & Units 11/18/2018 06/25/2018 12/07/2017  Total Protein 6.5 - 8.1 g/dL 6.7 6.5 -  Albumin 3.5 - 5.0 g/dL 3.3(L) 3.5 4.1  AST 15 - 41 U/L 22 20 -  ALT 0 - 44 U/L 21 18 -  Alk Phosphatase 38 - 126 U/L 60 43 -  Total Bilirubin 0.3 - 1.2 mg/dL 0.7 0.8 -    Cardiac Panel (last 3 results) Recent Labs    11/06/18 0451  TROPONINI 0.08*    CXR: No significant change    IMPRESSION/PLAN: PULMONARY: Acute/chronic hypoxemic/hypercapnic respiratory failure Former smoker with history of COPD Vague RLL opacity, possible pneumonia COVID-19 rule out (low likelihood)  Cont full vent  support - settings reviewed and/or adjusted Cont vent bundle Daily SBT if/when meets criteria Continue nebulized steroids and bronchodilators ordered Continue methylprednisolone at 40 mg every 12 hours  CARDIOVASCULAR: History of dilated cardiomyopathy History of severe MR  Continue ICU hemodynamic monitoring MAP goal >65 mmHg  RENAL: CKD Mild AKI, resolved Mild hypernatremia  Monitor BMET intermittently Monitor I/Os Correct electrolytes as indicated D5W ordered  GI/NUTRITION: Severe obesity  SUP: IV famotidine Consider TF protocol 4/13  INFECTIOUS DISEASE: E coli bacteremia BC + for coag negative Staphylococcus (likely contaminant) Possible community-acquired pneumonia Pyuria with negative urine culture  Monitor temp, WBC count Micro and abx as above  ENDOCRINE: DM 2 with h mild  yperglycemia  Continue moderate scale SSI every 4 hours  HEME: Mild thrombocytopenia  DVT px: Enoxaparin Monitor CBC intermittently Transfuse per usual guidelines  NEURO: Acute encephalopathy  PAD protocol: Propofol infusion, intermittent fentanyl RASS goal -1, -2   CCM time: 35 mins The above time includes time spent in consultation with patient and/or family members and reviewing care plan on multidisciplinary rounds  Merton Border, MD PCCM service Mobile [phone] Pager [phone] 11/06/2018 3:22 PM

## 2018-11-07 ENCOUNTER — Inpatient Hospital Stay: Payer: Medicare Other

## 2018-11-07 DIAGNOSIS — R6520 Severe sepsis without septic shock: Secondary | ICD-10-CM

## 2018-11-07 DIAGNOSIS — I420 Dilated cardiomyopathy: Secondary | ICD-10-CM

## 2018-11-07 DIAGNOSIS — A419 Sepsis, unspecified organism: Secondary | ICD-10-CM

## 2018-11-07 LAB — CBC
HCT: 35.8 % — ABNORMAL LOW (ref 36.0–46.0)
Hemoglobin: 10.6 g/dL — ABNORMAL LOW (ref 12.0–15.0)
MCH: 28.5 pg (ref 26.0–34.0)
MCHC: 29.6 g/dL — ABNORMAL LOW (ref 30.0–36.0)
MCV: 96.2 fL (ref 80.0–100.0)
Platelets: 118 10*3/uL — ABNORMAL LOW (ref 150–400)
RBC: 3.72 MIL/uL — ABNORMAL LOW (ref 3.87–5.11)
RDW: 16 % — ABNORMAL HIGH (ref 11.5–15.5)
WBC: 8.9 10*3/uL (ref 4.0–10.5)
nRBC: 0.2 % (ref 0.0–0.2)

## 2018-11-07 LAB — GLUCOSE, CAPILLARY
Glucose-Capillary: 129 mg/dL — ABNORMAL HIGH (ref 70–99)
Glucose-Capillary: 147 mg/dL — ABNORMAL HIGH (ref 70–99)
Glucose-Capillary: 160 mg/dL — ABNORMAL HIGH (ref 70–99)
Glucose-Capillary: 160 mg/dL — ABNORMAL HIGH (ref 70–99)
Glucose-Capillary: 164 mg/dL — ABNORMAL HIGH (ref 70–99)
Glucose-Capillary: 165 mg/dL — ABNORMAL HIGH (ref 70–99)
Glucose-Capillary: 181 mg/dL — ABNORMAL HIGH (ref 70–99)

## 2018-11-07 LAB — NOVEL CORONAVIRUS, NAA (HOSP ORDER, SEND-OUT TO REF LAB; TAT 18-24 HRS): SARS-CoV-2, NAA: NOT DETECTED

## 2018-11-07 LAB — BASIC METABOLIC PANEL
Anion gap: 7 (ref 5–15)
BUN: 18 mg/dL (ref 8–23)
CO2: 24 mmol/L (ref 22–32)
Calcium: 7.1 mg/dL — ABNORMAL LOW (ref 8.9–10.3)
Chloride: 115 mmol/L — ABNORMAL HIGH (ref 98–111)
Creatinine, Ser: 0.82 mg/dL (ref 0.44–1.00)
GFR calc Af Amer: >60 mL/min (ref >60)
GFR calc non Af Amer: >60 mL/min (ref >60)
Glucose, Bld: 172 mg/dL — ABNORMAL HIGH (ref 70–99)
Potassium: 4 mmol/L (ref 3.5–5.1)
Sodium: 146 mmol/L — ABNORMAL HIGH (ref 135–145)

## 2018-11-07 LAB — MAGNESIUM: Magnesium: 2.4 mg/dL (ref 1.7–2.4)

## 2018-11-07 LAB — HEMOGLOBIN A1C
Hgb A1c MFr Bld: 6.1 % — ABNORMAL HIGH (ref 4.8–5.6)
Mean Plasma Glucose: 128 mg/dL

## 2018-11-07 LAB — PROCALCITONIN: Procalcitonin: 27.1 ng/mL

## 2018-11-07 LAB — BRAIN NATRIURETIC PEPTIDE: B Natriuretic Peptide: 662 pg/mL — ABNORMAL HIGH (ref 0.0–100.0)

## 2018-11-07 LAB — PHOSPHORUS: Phosphorus: 2.4 mg/dL — ABNORMAL LOW (ref 2.5–4.6)

## 2018-11-07 MED ORDER — FENTANYL CITRATE (PF) 100 MCG/2ML IJ SOLN
50.0000 ug | Freq: Once | INTRAMUSCULAR | Status: AC
  Administered 2018-11-07: 50 ug via INTRAVENOUS

## 2018-11-07 MED ORDER — PRO-STAT SUGAR FREE PO LIQD
90.0000 mL | Freq: Two times a day (BID) | ORAL | Status: DC
  Administered 2018-11-07 – 2018-11-09 (×5): 90 mL

## 2018-11-07 MED ORDER — FUROSEMIDE 10 MG/ML IJ SOLN
20.0000 mg | Freq: Once | INTRAMUSCULAR | Status: AC
  Administered 2018-11-07: 20 mg via INTRAVENOUS
  Filled 2018-11-07: qty 2

## 2018-11-07 MED ORDER — VITAL HIGH PROTEIN PO LIQD
1000.0000 mL | ORAL | Status: DC
  Administered 2018-11-07 – 2018-11-09 (×3): 1000 mL

## 2018-11-07 MED ORDER — MIDAZOLAM HCL 2 MG/2ML IJ SOLN
2.0000 mg | Freq: Once | INTRAMUSCULAR | Status: AC
  Administered 2018-11-07: 2 mg via INTRAVENOUS

## 2018-11-07 MED ORDER — FENTANYL 2500MCG IN NS 250ML (10MCG/ML) PREMIX INFUSION
0.0000 ug/h | INTRAVENOUS | Status: DC
  Administered 2018-11-07: 100 ug/h via INTRAVENOUS
  Administered 2018-11-08: 22:00:00 200 ug/h via INTRAVENOUS
  Administered 2018-11-08: 07:00:00 250 ug/h via INTRAVENOUS
  Administered 2018-11-09: 10:00:00 200 ug/h via INTRAVENOUS
  Filled 2018-11-07 (×4): qty 250

## 2018-11-07 MED ORDER — METHYLPREDNISOLONE SODIUM SUCC 40 MG IJ SOLR: 40.0000 mg | Freq: Every day | INTRAMUSCULAR | Status: DC

## 2018-11-07 MED ORDER — FAMOTIDINE 20 MG PO TABS: 20.0000 mg | ORAL_TABLET | Freq: Every day | ORAL | Status: DC

## 2018-11-07 MED ORDER — PRO-STAT SUGAR FREE PO LIQD: 30.0000 mL | Freq: Two times a day (BID) | ORAL | Status: DC

## 2018-11-07 MED ORDER — VITAL HIGH PROTEIN PO LIQD: 1000.0000 mL | ORAL | Status: DC

## 2018-11-07 MED ORDER — VITAL HIGH PROTEIN PO LIQD
1000.0000 mL | ORAL | Status: DC
  Administered 2018-11-07: 1000 mL

## 2018-11-07 MED ORDER — METHYLPREDNISOLONE SODIUM SUCC 40 MG IJ SOLR
40.0000 mg | Freq: Every day | INTRAMUSCULAR | Status: DC
  Administered 2018-11-07 – 2018-11-08 (×2): 40 mg via INTRAVENOUS
  Filled 2018-11-07 (×2): qty 1

## 2018-11-07 MED ORDER — FAMOTIDINE 20 MG PO TABS
20.0000 mg | ORAL_TABLET | Freq: Every day | ORAL | Status: DC
  Administered 2018-11-08 – 2018-11-10 (×3): 20 mg
  Filled 2018-11-07 (×3): qty 1

## 2018-11-07 NOTE — Progress Notes (Signed)
Pharmacy Antibiotic Note  [name] [name] is a 73 y.o. female admitted on 30-Nov-2018 with bacteremia. With possible PNA as source. Patient is currently intubated. AKI has resolved; Scr baseline ~1.07, current is 0.82. Pharmacy has been consulted for Meropenem dosing.  Plan: Continue Meropenem 1g Q 8 hours. Per CCM discussion, will de-escalate pending susceptibilities.  Height: 5\' 5"  (165.1 cm) Weight: 248 lb 14.4 oz (112.9 kg) IBW/kg (Calculated) : 57  Temp (24hrs), Avg:98.6 F (37 C), Min:97.8 F (36.6 C), Max:99.1 F (37.3 C)  Recent Labs  Lab 11-30-18 0921 30-Nov-2018 1210 11/06/18 0451 11/07/18 0408  WBC 10.8*  --  10.6* 8.9  CREATININE 1.27*  --  0.89 0.82  LATICACIDVEN 3.0* 1.2  --   --     Estimated Creatinine Clearance: 75.4 mL/min (by C-G formula based on SCr of 0.82 mg/dL).    Allergies  Allergen Reactions  . Sulfa Antibiotics Itching    Pt states she gets "itching inside and out"  . Cymbalta [Duloxetine Hcl] Nausea Only    Not allergic  . Tramadol Hcl Other (See Comments)    Not allergic    Antimicrobials this admission: Meropenem 4/12 >>  Vancomycin 4/12 x 1 dose Rocephin 4/11 x 1 dose Azithromycin 4/11 x 1 dose  Dose adjustments this admission: None to note.  Microbiology results: 4/11 BCx: E.coli(+), CoNs (likely contaminant)\ 4/11 COVID (-) 4/11 Resp Cx few GPCs, few GNRs 4/11 UCx: negative   04/11 MRSA PCR: negative  Thank you for allowing pharmacy to be a part of this patient's care.  Mauri Reading 11/07/2018 11:56 AM

## 2018-11-07 NOTE — Progress Notes (Signed)
Consult received for goals of care. Patient remains intubated on low sedation. Unable to follow commands. Assessed at bedside.   Voicemail left with son, Jorja Loa x2 on today.   Detailed note and recommendations once GOC has been completed with family.    Thank you for allowing Palliative Medicine team to be involved in patient's care.   Willette Alma, AGPCNP-BC Palliative Medicine Team  Phone: [phone] Pager: [phone] Amion: Thea Alken   NO Charge

## 2018-11-07 NOTE — Progress Notes (Signed)
Initial Nutrition Assessment  RD working remotely.  DOCUMENTATION CODES:   Morbid obesity  INTERVENTION:  Initiate Vital High Protein at 25 mL/hr (600 mL goal daily volume) + Pro-Stat 90 mL BID per tube. Provides 1200 kcal, 143 grams of protein, 504 mL H2O daily. With current propofol rate provides 1810 kcal daily.  Provide liquid MVI daily per tube.  Provide minimum free water flush of 20-30 mL Q4hrs to maintain tube patency.  NUTRITION DIAGNOSIS:   Inadequate oral intake related to inability to eat as evidenced by NPO status.  GOAL:   Provide needs based on ASPEN/SCCM guidelines  MONITOR:   Vent status, Labs, Weight trends, TF tolerance, I & O's  REASON FOR ASSESSMENT:   Ventilator, Consult Enteral/tube feeding initiation and management  ASSESSMENT:   73 year old female with PMHx of COPD, HTN, chronic combined systolic and diastolic CHF, severe mitral regurgitation, CAD, asthma admitted with AMS and hypoxemic respiratory requiring intubation on 4/11, found to be negative for COVID-19.   Patient intubated and sedated. On PRVC mode with FiO2 35% and PEEP 5 cmH2O. Abdomen distended but soft per RN assessment. Last BM unknown/PTA. Weight in chart has been increasing over the pats year. She was 92-95 kg in 215. She was 104.1 kg on 08/04/2018. Will use weight of 109.9 kg from 4/11 to estimate needs as weight from 4/12 is +3 kg likely from fluid.  Enteral Access: 16 Fr. OGT placed 4/11; terminates in mid body of stomach per abdominal x-ray 4/11; 58 cm at corner of mouth  MAP: 90-129 mmHg  Patient is currently intubated on ventilator support Ve: 11.7 L/min Temp (24hrs), Avg:98.7 F (37.1 C), Min:97.8 F (36.6 C), Max:99.1 F (37.3 C)  Propofol: 23.1 mL/hr (610 kcal daily)  Medications reviewed and include: famotidine, Novolog 0-15 units Q4hrs, Solu-Medrol 40 mg daily IV, meropenem, propofol gtt.  Labs reviewed: CBG 160-181, BNP 662, Sodium 146, Chloride 115, Phosphorus  2.4.  I/O: 1575 mL UOP yesterday (0.6 mL/kg/hr)  NUTRITION - FOCUSED PHYSICAL EXAM:  Unable to complete at this time.  Diet Order:   Diet Order    None     EDUCATION NEEDS:   No education needs have been identified at this time  Skin:  Skin Assessment: Reviewed RN Assessment  Last BM:  Unknown/PTA  Height:   Ht Readings from Last 1 Encounters:  10/31/2018 5\' 5"  (1.651 m)   Weight:   Wt Readings from Last 1 Encounters:  11/06/18 112.9 kg   Ideal Body Weight:  56.8 kg  BMI:  Body mass index is 41.42 kg/m.  Estimated Nutritional Needs:   Kcal:  7096-2836 (11-14 kcal/kg)  Protein:  142 grams (2.5 grams/kg IBW)  Fluid:  1.4 L/day (25 mL/kg IBW)  Helane Rima, MS, RD, LDN Office: [phone] Pager: [phone] After Hours/Weekend Pager: [phone]

## 2018-11-07 NOTE — Progress Notes (Signed)
PCCM ROUNDING NOTE  INITIAL PRESENTATION: 73 y.o. F with history of COPD, dilated cardiomyopathy (LVEF 25-30% by echo 2018), mitral regurgitation presented to [facility] ED via EMS with altered mental status and hypoxemic respiratory failure.  Intubated in ED  MAJOR EVENTS/TEST RESULTS: 04/11 admission as documented above 04/12 failed SBT 04/13 Failed brief SBT. PSV weaning initiated  INDWELLING DEVICES:: ETT 04/11 >>    MICRO DATA: MRSA PCR 4/11>> negative Urine 4/11 >> NEG Resp 4/11>> consistent with normal respiratory flora Blood 4/11>> E coli in one bottle (likely real) and coag neg staph in one bottle (likely contaminant)  COVID-19 04/11 >> negative  PCT 04/11 0.54, 04/13: 27.10,   ANTIMICROBIALS:  Azithromycin 04/11 >> 04/12 Ceftriaxone 04/11 >> 04/12 Vancomycin 04/12 X 1 Meropenem 04/12 >>     SUBJ: Remains intubated and sedated.  Synchronous with ventilator.  Did not tolerate SBT.  PSV weaning initiated.   OBJ: Vitals:   11/07/18 0500 11/07/18 0600 11/07/18 0800 11/07/18 0900  BP: 127/72 123/84 (!) 152/118 (!) 155/78  Pulse: 87 78 84 98  Resp: (!) 21 17 (!) 22 (!) 25  Temp:    98.8 F (37.1 C)  TempSrc:    Oral  SpO2: 99% 98% 100% 96%  Weight:      Height:        Vent Mode: PRVC FiO2 (%):  [24 %-35 %] 24 % Set Rate:  [15 bmp] 15 bmp Vt Set:  [500 mL] 500 mL PEEP:  [5 cmH20] 5 cmH20 Plateau Pressure:  [20 cmH20] 20 cmH20   Intake/Output Summary (Last 24 hours) at 11/07/2018 1132 Last data filed at 11/07/2018 0600 Gross per 24 hour  Intake 617.87 ml  Output 1445 ml  Net -827.13 ml     Gen: RASS -4, intubated, synchronous HEENT: NCAT, sclerae white Neck: No JVD noted Lungs: Distant and expiratory wheezes Cardiovascular: Regular, soft systolic murmur Abdomen: Obese, soft, NT, + BS Ext: Warm, no edema Neuro: Cranial nerves grossly intact, moves all extremities spontaneously Skin: Limited exam, no lesions noted   BMP Latest Ref Rng & Units  11/07/2018 11/06/2018 11/23/2018  Glucose 70 - 99 mg/dL 172(H) 149(H) 203(H)  BUN 8 - 23 mg/dL 18 20 22  Creatinine 0.44 - 1.00 mg/dL 0.82 0.89 1.27(H)  BUN/Creat Ratio 12 - 28 - - -  Sodium 135 - 145 mmol/L 146(H) 145 143  Potassium 3.5 - 5.1 mmol/L 4.0 4.4 4.9  Chloride 98 - 111 mmol/L 115(H) 110 102  CO2 22 - 32 mmol/L 24 24 30  Calcium 8.9 - 10.3 mg/dL 7.1(L) 7.2(L) 8.2(L)    CBC Latest Ref Rng & Units 11/07/2018 11/06/2018 11/15/2018  WBC 4.0 - 10.5 K/uL 8.9 10.6(H) 10.8(H)  Hemoglobin 12.0 - 15.0 g/dL 10.6(L) 10.0(L) 12.2  Hematocrit 36.0 - 46.0 % 35.8(L) 33.2(L) 41.4  Platelets 150 - 400 K/uL 118(L) 108(L) 144(L)    Hepatic Function Latest Ref Rng & Units 11/12/2018 06/25/2018 12/07/2017  Total Protein 6.5 - 8.1 g/dL 6.7 6.5 -  Albumin 3.5 - 5.0 g/dL 3.3(L) 3.5 4.1  AST 15 - 41 U/L 22 20 -  ALT 0 - 44 U/L 21 18 -  Alk Phosphatase 38 - 126 U/L 60 43 -  Total Bilirubin 0.3 - 1.2 mg/dL 0.7 0.8 -    Cardiac Panel (last 3 results) Recent Labs    11/06/18 0451  TROPONINI 0.08*    CXR: No new film    IMPRESSION/PLAN: PULMONARY: Acute/chronic hypoxemic/hypercapnic respiratory failure Former smoker with history of   COPD Vague RLL opacity, possible pneumonia COVID-19 rule out (low likelihood)  Cont vent support - settings reviewed and/or adjusted Wean in PSV mode as tolerated Cont vent bundle Daily SBT if/when meets criteria Continue nebulized steroids and bronchodilators ordered Decrease methylprednisolone to 40 mg daily 4/13  CARDIOVASCULAR: History of dilated cardiomyopathy History of severe MR  Continue ICU hemodynamic monitoring MAP goal >65 mmHg  RENAL: CKD Mild AKI, resolved Mild hypernatremia  Monitor BMET intermittently Monitor I/Os Correct electrolytes as indicated Continue free water repletion  GI/NUTRITION: Severe obesity  SUP: Enteral famotidine TF protocol initiated 4/13  INFECTIOUS DISEASE: E coli bacteremia BC + for coag negative  Staphylococcus (likely contaminant) Possible community-acquired pneumonia Pyuria with negative urine culture Elevated PCT  Monitor temp, WBC count Micro and abx as above Recheck PCT 4/14  ENDOCRINE: DM 2 with mild hyperglycemia  Continue moderate scale SSI every 4 hours  HEME: Mild thrombocytopenia  DVT px: Enoxaparin Monitor CBC intermittently Transfuse per usual guidelines  NEURO: Acute encephalopathy  PAD protocol: Propofol infusion, intermittent fentanyl RASS goal -1, -2   CCM time: 35 mins The above time includes time spent in consultation with patient and/or family members and reviewing care plan on multidisciplinary rounds   , MD PCCM service Mobile [phone] Pager [phone] 11/07/2018 11:32 AM  

## 2018-11-07 NOTE — Progress Notes (Signed)
[employer] [facility] [place] at [facility]   PATIENT NAME: [name] [name]    MR#:  [id number]  DATE OF BIRTH:  April 12, 1946  SUBJECTIVE:   Patient remains intubated and sedated.  No overnight events.  Not tolerating SBT this morning.  REVIEW OF SYSTEMS:  ROS-unable to obtain due to sedation  DRUG ALLERGIES:   Allergies  Allergen Reactions  . Sulfa Antibiotics Itching    Pt states she gets "itching inside and out"  . Cymbalta [Duloxetine Hcl] Nausea Only    Not allergic  . Tramadol Hcl Other (See Comments)    Not allergic   VITALS:  Blood pressure (!) 154/90, pulse (!) 109, temperature 99 F (37.2 C), resp. rate (!) 27, height 5\' 5"  (1.651 m), weight 112.9 kg, SpO2 95 %. PHYSICAL EXAMINATION:  Physical Exam  GENERAL:  Laying in the bed with no acute distress, sedated HEENT: Head atraumatic, normocephalic. Pupils equal, round, reactive to light and accommodation. No scleral icterus. Extraocular muscles intact. Oropharynx and nasopharynx clear.  NECK:  Supple, no jugular venous distention. No thyroid enlargement. LUNGS: Lungs are clear to auscultation bilaterally. No wheezes, crackles, rhonchi. No use of accessory muscles of respiration.  CARDIOVASCULAR: S1, S2 normal. No rubs, or gallops. 2/6 systolic murmur present ABDOMEN: Soft, nontender, nondistended. Bowel sounds present.  EXTREMITIES: No pedal edema, cyanosis, or clubbing.  NEUROLOGIC:  Unable to assess PSYCHIATRIC: Unable to assess SKIN: No obvious rash, lesion, or ulcer.  LABORATORY PANEL:  Female CBC Recent Labs  Lab 11/07/18 0408  WBC 8.9  HGB 10.6*  HCT 35.8*  PLT 118*   ------------------------------------------------------------------------------------------------------------------ Chemistries  Recent Labs  Lab 12-Nov-2018 0921  11/07/18 0408 11/07/18 0431  NA 143   < > 146*  --   K 4.9   < > 4.0  --   CL 102   < > 115*  --   CO2 30   < > 24  --   GLUCOSE 203*   < > 172*  --   BUN 22   < > 18   --   CREATININE 1.27*   < > 0.82  --   CALCIUM 8.2*   < > 7.1*  --   MG  --    < >  --  2.4  AST 22  --   --   --   ALT 21  --   --   --   ALKPHOS 60  --   --   --   BILITOT 0.7  --   --   --    < > = values in this interval not displayed.   RADIOLOGY:  No results found. ASSESSMENT AND PLAN:   E. coli bacteremia- pneumonia may be the source.  Urine culture with no growth. -Continue meropenem -Follow-up blood culture susceptibilities  Acute on chronic hypoxemic hypercapnic respiratory failure-likely due to COPD exacerbation and possible pneumonia -Vent management per CCM -COVID testing negative -Continue steroids, nebulizers, bronchodilators  Type 2 diabetes-blood sugars have been mildly elevated -Continue SSI  History of severe mitral regurgitation and non-ischemic cardiomyopathy -Monitor fluid status  All the records are reviewed and case discussed with Care Management/Social Worker. Management plans discussed with the patient, family and they are in agreement.  CODE STATUS: Full Code  TOTAL TIME TAKING CARE OF THIS PATIENT: 45 minutes.   More than 50% of the time was spent in counseling/coordination of care: YES  Discharge is unknown.  Jinny Blossom Newell Frater M.D on 11/07/2018 at 5:18  PM  Between 7am to 6pm - Pager - [phone]  After 6pm go to [email or web address] - Social research officer, government  [facility] [place] [facility]  Office  [phone]  CC: Primary care physician; Barbette Reichmann, MD  Note: This dictation was prepared with Dragon dictation along with smaller phrase technology. Any transcriptional errors that result from this process are unintentional.

## 2018-11-08 DIAGNOSIS — Z1612 Extended spectrum beta lactamase (ESBL) resistance: Secondary | ICD-10-CM

## 2018-11-08 DIAGNOSIS — A499 Bacterial infection, unspecified: Secondary | ICD-10-CM

## 2018-11-08 LAB — CULTURE, BLOOD (ROUTINE X 2)

## 2018-11-08 LAB — BLOOD GAS, ARTERIAL
Acid-base deficit: 1.4 mmol/L (ref 0.0–2.0)
Bicarbonate: 28.5 mmol/L — ABNORMAL HIGH (ref 20.0–28.0)
FIO2: 0.6
MECHVT: 500 mL
Mechanical Rate: 15
O2 Saturation: 99.6 %
PEEP: 5 cmH2O
Patient temperature: 37
pCO2 arterial: 73 mmHg (ref 32.0–48.0)
pH, Arterial: 7.2 — ABNORMAL LOW (ref 7.350–7.450)
pO2, Arterial: 201 mmHg — ABNORMAL HIGH (ref 83.0–108.0)

## 2018-11-08 LAB — CULTURE, RESPIRATORY W GRAM STAIN: Culture: NORMAL

## 2018-11-08 LAB — COMPREHENSIVE METABOLIC PANEL
ALT: 21 U/L (ref 0–44)
AST: 14 U/L — ABNORMAL LOW (ref 15–41)
Albumin: 2.5 g/dL — ABNORMAL LOW (ref 3.5–5.0)
Alkaline Phosphatase: 43 U/L (ref 38–126)
Anion gap: 9 (ref 5–15)
BUN: 31 mg/dL — ABNORMAL HIGH (ref 8–23)
CO2: 25 mmol/L (ref 22–32)
Calcium: 7.4 mg/dL — ABNORMAL LOW (ref 8.9–10.3)
Chloride: 111 mmol/L (ref 98–111)
Creatinine, Ser: 1.09 mg/dL — ABNORMAL HIGH (ref 0.44–1.00)
GFR calc Af Amer: 58 mL/min — ABNORMAL LOW (ref >60)
GFR calc non Af Amer: 50 mL/min — ABNORMAL LOW (ref >60)
Glucose, Bld: 172 mg/dL — ABNORMAL HIGH (ref 70–99)
Potassium: 4.5 mmol/L (ref 3.5–5.1)
Sodium: 145 mmol/L (ref 135–145)
Total Bilirubin: 0.5 mg/dL (ref 0.3–1.2)
Total Protein: 5.3 g/dL — ABNORMAL LOW (ref 6.5–8.1)

## 2018-11-08 LAB — TRIGLYCERIDES
Triglycerides: 145 mg/dL (ref <150)
Triglycerides: 98 mg/dL (ref <150)

## 2018-11-08 LAB — GLUCOSE, CAPILLARY
Glucose-Capillary: 139 mg/dL — ABNORMAL HIGH (ref 70–99)
Glucose-Capillary: 144 mg/dL — ABNORMAL HIGH (ref 70–99)
Glucose-Capillary: 156 mg/dL — ABNORMAL HIGH (ref 70–99)
Glucose-Capillary: 163 mg/dL — ABNORMAL HIGH (ref 70–99)
Glucose-Capillary: 163 mg/dL — ABNORMAL HIGH (ref 70–99)
Glucose-Capillary: 166 mg/dL — ABNORMAL HIGH (ref 70–99)

## 2018-11-08 LAB — PROCALCITONIN: Procalcitonin: 19.35 ng/mL

## 2018-11-08 LAB — CBC
HCT: 31.7 % — ABNORMAL LOW (ref 36.0–46.0)
Hemoglobin: 9.3 g/dL — ABNORMAL LOW (ref 12.0–15.0)
MCH: 28.6 pg (ref 26.0–34.0)
MCHC: 29.3 g/dL — ABNORMAL LOW (ref 30.0–36.0)
MCV: 97.5 fL (ref 80.0–100.0)
Platelets: 134 10*3/uL — ABNORMAL LOW (ref 150–400)
RBC: 3.25 MIL/uL — ABNORMAL LOW (ref 3.87–5.11)
RDW: 16.2 % — ABNORMAL HIGH (ref 11.5–15.5)
WBC: 7.6 10*3/uL (ref 4.0–10.5)
nRBC: 0.5 % — ABNORMAL HIGH (ref 0.0–0.2)

## 2018-11-08 LAB — BRAIN NATRIURETIC PEPTIDE: B Natriuretic Peptide: 591 pg/mL — ABNORMAL HIGH (ref 0.0–100.0)

## 2018-11-08 LAB — PHOSPHORUS: Phosphorus: 4.7 mg/dL — ABNORMAL HIGH (ref 2.5–4.6)

## 2018-11-08 LAB — MAGNESIUM: Magnesium: 2.6 mg/dL — ABNORMAL HIGH (ref 1.7–2.4)

## 2018-11-08 MED ORDER — METHYLPREDNISOLONE SODIUM SUCC 40 MG IJ SOLR
40.0000 mg | Freq: Two times a day (BID) | INTRAMUSCULAR | Status: DC
  Administered 2018-11-09: 40 mg via INTRAVENOUS
  Filled 2018-11-08: qty 1

## 2018-11-08 MED ORDER — METHYLPREDNISOLONE SODIUM SUCC 125 MG IJ SOLR
INTRAMUSCULAR | Status: AC
  Administered 2018-11-08: 60 mg via INTRAVENOUS
  Filled 2018-11-08: qty 2

## 2018-11-08 MED ORDER — INSULIN GLARGINE 100 UNIT/ML ~~LOC~~ SOLN
10.0000 [IU] | Freq: Every day | SUBCUTANEOUS | Status: DC
  Administered 2018-11-08 – 2018-11-10 (×3): 10 [IU] via SUBCUTANEOUS
  Filled 2018-11-08 (×4): qty 0.1

## 2018-11-08 MED ORDER — METHYLPREDNISOLONE SODIUM SUCC 125 MG IJ SOLR
60.0000 mg | INTRAMUSCULAR | Status: AC
  Administered 2018-11-08: 60 mg via INTRAVENOUS

## 2018-11-08 MED ORDER — VECURONIUM BROMIDE 10 MG IV SOLR
INTRAVENOUS | Status: AC
  Administered 2018-11-08: 10 mg via INTRAVENOUS
  Filled 2018-11-08: qty 10

## 2018-11-08 MED ORDER — VECURONIUM BROMIDE 10 MG IV SOLR
10.0000 mg | Freq: Once | INTRAVENOUS | Status: AC
  Administered 2018-11-08: 22:00:00 10 mg via INTRAVENOUS
  Filled 2018-11-08: qty 10

## 2018-11-08 MED ORDER — FREE WATER
200.0000 mL | Freq: Three times a day (TID) | Status: DC
  Administered 2018-11-08 (×2): 200 mL

## 2018-11-08 MED ORDER — IPRATROPIUM-ALBUTEROL 0.5-2.5 (3) MG/3ML IN SOLN
3.0000 mL | RESPIRATORY_TRACT | Status: DC
  Administered 2018-11-09 (×3): 3 mL via RESPIRATORY_TRACT
  Filled 2018-11-08 (×3): qty 3

## 2018-11-08 MED ORDER — METHYLPREDNISOLONE SODIUM SUCC 125 MG IJ SOLR
80.0000 mg | Freq: Once | INTRAMUSCULAR | Status: AC
  Administered 2018-11-08: 80 mg via INTRAVENOUS
  Filled 2018-11-08: qty 2

## 2018-11-08 MED ORDER — VECURONIUM BROMIDE 10 MG IV SOLR
10.0000 mg | Freq: Once | INTRAVENOUS | Status: AC
  Administered 2018-11-08: 10 mg via INTRAVENOUS

## 2018-11-08 MED ORDER — PROPOFOL 1000 MG/100ML IV EMUL
5.0000 ug/kg/min | INTRAVENOUS | Status: DC
  Administered 2018-11-08: 30 ug/kg/min via INTRAVENOUS
  Administered 2018-11-08: 20 ug/kg/min via INTRAVENOUS
  Administered 2018-11-09: 30 ug/kg/min via INTRAVENOUS
  Filled 2018-11-08 (×3): qty 100

## 2018-11-08 NOTE — Progress Notes (Addendum)
Pharmacy Antibiotic Note  [name] [name] is a 73 y.o. female admitted on 11/23/2018 with bacteremia. With possible PNA as source. Patient is currently intubated. Will continue monitor renal function as patient was in AKI upon admission and Scr has bumped.. Pharmacy has been consulted for Meropenem dosing.  Plan: Continuee Meropenem to 1g Q 8 hours. D3 of appropriate therapy.  Height: 5\' 5"  (165.1 cm) Weight: 248 lb 14.4 oz (112.9 kg) IBW/kg (Calculated) : 57  Temp (24hrs), Avg:98.3 F (36.8 C), Min:97.7 F (36.5 C), Max:99.2 F (37.3 C)  Recent Labs  Lab 11/17/2018 0921 11/04/2018 1210 11/06/18 0451 11/07/18 0408 11/08/18 0513  WBC 10.8*  --  10.6* 8.9 7.6  CREATININE 1.27*  --  0.89 0.82 1.09*  LATICACIDVEN 3.0* 1.2  --   --   --     Estimated Creatinine Clearance: 56.8 mL/min (A) (by C-G formula based on SCr of 1.09 mg/dL (H)).    Allergies  Allergen Reactions  . Sulfa Antibiotics Itching    Pt states she gets "itching inside and out"  . Cymbalta [Duloxetine Hcl] Nausea Only    Not allergic  . Tramadol Hcl Other (See Comments)    Not allergic    Antimicrobials this admission: Meropenem 4/12 >>  Vancomycin 4/12 x 1 dose Rocephin 4/11 x 1 dose Azithromycin 4/11 x 1 dose  Dose adjustments this admission: N/A  Microbiology results: 4/11 BCx: E.coli(+) and ESBL Resistance, CoNs (likely contaminant)\ 4/11 COVID (-) 4/11 Resp Cx few GPCs, few GNRs 4/11 UCx: negative   04/11 MRSA PCR: negative  Thank you for allowing pharmacy to be a part of this patient's care.  Mauri Reading 11/08/2018 1:21 PM

## 2018-11-08 NOTE — Progress Notes (Signed)
Patient has been tachycardic and hypertensive since decreasing sedation this morning per MD order.  MD aware, will continue to monitor.

## 2018-11-08 NOTE — Progress Notes (Signed)
[employer] - [place] at [facility]   PATIENT NAME: [name] [name]    MR#:  [id number]  DATE OF BIRTH:  Oct 26, 1945  SUBJECTIVE:   Patient remains intubated and sedated.  No overnight events.   REVIEW OF SYSTEMS:  ROS-unable to obtain due to sedation  DRUG ALLERGIES:   Allergies  Allergen Reactions  . Sulfa Antibiotics Itching    Pt states she gets "itching inside and out"  . Cymbalta [Duloxetine Hcl] Nausea Only    Not allergic  . Tramadol Hcl Other (See Comments)    Not allergic   VITALS:  Blood pressure (!) 172/100, pulse (!) 108, temperature 99.2 F (37.3 C), temperature source Axillary, resp. rate 12, height 5\' 5"  (1.651 m), weight 112.9 kg, SpO2 94 %. PHYSICAL EXAMINATION:  Physical Exam  GENERAL:  Laying in the bed with no acute distress, sedated HEENT: Head atraumatic, normocephalic. Pupils equal, round, reactive to light and accommodation. No scleral icterus. Extraocular muscles intact. Oropharynx and nasopharynx clear.  NECK:  Supple, no jugular venous distention. No thyroid enlargement. LUNGS: Lungs are clear to auscultation bilaterally. No wheezes, crackles, rhonchi. No use of accessory muscles of respiration.  CARDIOVASCULAR: Tachycardic, regular rhythm, S1, S2 normal. No rubs, or gallops. 2/6 systolic murmur present ABDOMEN: Soft, nontender, nondistended. Bowel sounds present.  EXTREMITIES: No pedal edema, cyanosis, or clubbing.  NEUROLOGIC:  Unable to assess PSYCHIATRIC: Unable to assess SKIN: No obvious rash, lesion, or ulcer.  LABORATORY PANEL:  Female CBC Recent Labs  Lab 11/08/18 0513  WBC 7.6  HGB 9.3*  HCT 31.7*  PLT 134*   ------------------------------------------------------------------------------------------------------------------ Chemistries  Recent Labs  Lab 11/08/18 0513  NA 145  K 4.5  CL 111  CO2 25  GLUCOSE 172*  BUN 31*  CREATININE 1.09*  CALCIUM 7.4*  MG 2.6*  AST 14*  ALT 21  ALKPHOS 43  BILITOT 0.5   RADIOLOGY:  Dg Chest Port 1 View  Result Date: 11/07/2018 CLINICAL DATA:  73 year old female with acute respiratory failure EXAM: PORTABLE CHEST 1 VIEW COMPARISON:  11/06/2018, November 22, 2018 FINDINGS: Cardiomediastinal silhouette unchanged in size and contour. Low lung volumes. Coarsened interstitial markings without confluent airspace disease. Endotracheal tube appears slightly withdrawn, now terminating 3.4 cm above the carina. Unchanged gastric tube. No pneumothorax or large pleural effusion. Surgical changes of the right clavicle. IMPRESSION: There appears to have been interval slight withdrawal of the endotracheal tube, now terminating 3.4 cm above the carina. Unchanged appearance of low lung volumes and mild interstitial opacities. Unchanged gastric tube. Electronically Signed   By: Gilmer Mor D.O.   On: 11/07/2018 20:47   ASSESSMENT AND PLAN:   ESBL E. coli bacteremia- pneumonia may be the source.  Urine culture with no growth. -Continue meropenem -Palliative care consulted  Acute on chronic hypoxemic hypercapnic respiratory failure-likely due to COPD exacerbation and possible pneumonia -Vent management per CCM -COVID testing negative -Continue steroids, nebulizers, bronchodilators -On meropenem  Type 2 diabetes-blood sugars have been mildly elevated -Continue SSI  History of severe mitral regurgitation and non-ischemic cardiomyopathy -Monitor fluid status  All the records are reviewed and case discussed with Care Management/Social Worker. Management plans discussed with the patient, family and they are in agreement.  CODE STATUS: Full Code  TOTAL TIME TAKING CARE OF THIS PATIENT: 35 minutes.   More than 50% of the time was spent in counseling/coordination of care: YES  Discharge is unknown.  Jinny Blossom Tadashi Burkel M.D on 11/08/2018 at 1:15 PM  Between 7am to 6pm -  Pager - [phone]  After 6pm go to [email or web address] - Social research officer, government  [facility] [place] [facility]   Office  [phone]  CC: Primary care physician; Barbette Reichmann, MD  Note: This dictation was prepared with Dragon dictation along with smaller phrase technology. Any transcriptional errors that result from this process are unintentional.

## 2018-11-08 NOTE — Progress Notes (Signed)
PCCM ROUNDING NOTE  INITIAL PRESENTATION: 73 y.o. F with history of COPD, dilated cardiomyopathy (LVEF 25-30% by echo 2018), mitral regurgitation presented to [facility] ED via EMS with altered mental status and hypoxemic respiratory failure.  Intubated in ED  MAJOR EVENTS/TEST RESULTS: 04/11 admission as documented above 04/12 failed SBT 04/13 Failed brief SBT. PSV weaning initiated  INDWELLING DEVICES:: ETT 04/11 >>    MICRO DATA: MRSA PCR 4/11>> negative Urine 4/11 >> NEG Resp 4/11>> consistent with normal respiratory flora Blood 4/11>> 1/2 E coli (ESBL), 1/2 coag neg staph (contaminant) COVID-19 04/11 >> negative  PCT 04/11 0.54, 04/13: 27.10,  04/14: 19.35  ANTIMICROBIALS:  Azithromycin 04/11 >> 04/12 Ceftriaxone 04/11 >> 04/12 Vancomycin 04/12 X 1 Meropenem 04/12 >>     SUBJ: Remains intubated and sedated.  Mild dyssynchrony with ventilator due to prolonged expiratory effort.  Did not tolerate SBT.  Continuing to wean in PSV mode   OBJ: Vitals:   11/08/18 1000 11/08/18 1054 11/08/18 1100 11/08/18 1200  BP: (!) 170/86  (!) 185/110 (!) 177/105  Pulse: 100  (!) 119 (!) 116  Resp: '12  15 13  ' Temp:    99.2 F (37.3 C)  TempSrc:    Axillary  SpO2: 96% 98% 97% 97%  Weight:      Height:        Vent Mode: PSV FiO2 (%):  [24 %-40 %] 40 % Set Rate:  [15 bmp-22 bmp] 22 bmp Vt Set:  [500 mL] 500 mL PEEP:  [5 cmH20] 5 cmH20 Pressure Support:  [8 cmH20] 8 cmH20 Plateau Pressure:  [17 cmH20-18 cmH20] 18 cmH20   Intake/Output Summary (Last 24 hours) at 11/08/2018 1310 Last data filed at 11/08/2018 1202 Gross per 24 hour  Intake 1631.92 ml  Output 1560 ml  Net 71.92 ml     Gen: RASS -4, intubated, synchronous HEENT: NCAT, sclerae white Neck: No JVD noted Lungs: Distant end expiratory wheezes Cardiovascular: Regular, soft systolic murmur Abdomen: Obese, soft, NT, + BS Ext: Warm, no edema Neuro: Cranial nerves grossly intact, moves all extremities  spontaneously Skin: Limited exam, no lesions noted   BMP Latest Ref Rng & Units 11/08/2018 11/07/2018 11/06/2018  Glucose 70 - 99 mg/dL 172(H) 172(H) 149(H)  BUN 8 - 23 mg/dL 31(H) 18 20  Creatinine 0.44 - 1.00 mg/dL 1.09(H) 0.82 0.89  BUN/Creat Ratio 12 - 28 - - -  Sodium 135 - 145 mmol/L 145 146(H) 145  Potassium 3.5 - 5.1 mmol/L 4.5 4.0 4.4  Chloride 98 - 111 mmol/L 111 115(H) 110  CO2 22 - 32 mmol/L '25 24 24  ' Calcium 8.9 - 10.3 mg/dL 7.4(L) 7.1(L) 7.2(L)    CBC Latest Ref Rng & Units 11/08/2018 11/07/2018 11/06/2018  WBC 4.0 - 10.5 K/uL 7.6 8.9 10.6(H)  Hemoglobin 12.0 - 15.0 g/dL 9.3(L) 10.6(L) 10.0(L)  Hematocrit 36.0 - 46.0 % 31.7(L) 35.8(L) 33.2(L)  Platelets 150 - 400 K/uL 134(L) 118(L) 108(L)    Hepatic Function Latest Ref Rng & Units 11/08/2018 11/16/2018 06/25/2018  Total Protein 6.5 - 8.1 g/dL 5.3(L) 6.7 6.5  Albumin 3.5 - 5.0 g/dL 2.5(L) 3.3(L) 3.5  AST 15 - 41 U/L 14(L) 22 20  ALT 0 - 44 U/L '21 21 18  ' Alk Phosphatase 38 - 126 U/L 43 60 43  Total Bilirubin 0.3 - 1.2 mg/dL 0.5 0.7 0.8    Cardiac Panel (last 3 results) Recent Labs    11/06/18 0451  TROPONINI 0.08*    CXR: No new film  IMPRESSION/PLAN: PULMONARY: Acute/chronic hypoxemic/hypercapnic respiratory failure Former smoker with history of COPD Vague RLL opacity, possible pneumonia COVID-19 ruled out   Cont vent support - settings reviewed and/or adjusted Wean in PSV mode as tolerated Cont vent bundle Daily SBT if/when meets criteria Continue nebulized steroids and bronchodilators ordered Continue methylprednisolone at 40 mg daily  CARDIOVASCULAR: History of dilated cardiomyopathy History of severe MR  Continue ICU hemodynamic monitoring MAP goal >65 mmHg  RENAL: CKD Mild AKI Mild hypernatremia  Monitor BMET intermittently Monitor I/Os Correct electrolytes as indicated Continue free water repletion  GI/NUTRITION: Severe obesity  SUP: Enteral famotidine Continue TF protocol  initiated 4/13  INFECTIOUS DISEASE: E coli (ESBL) bacteremia BC + for coag negative Staph (likely contaminant) Possible community-acquired pneumonia Pyuria with negative urine culture Elevated PCT  Monitor temp, WBC count Micro and abx as above   ENDOCRINE: DM 2 with mild hyperglycemia  Continue moderate scale SSI every 4 hours  HEME: Mild thrombocytopenia  DVT px: Enoxaparin Monitor CBC intermittently Transfuse per usual guidelines  NEURO: Acute encephalopathy  PAD protocol: Fentanyl infusion RASS goal -1, -2 Propofol discontinued 4/14   CCM time: 40 mins The above time includes time spent in consultation with patient and/or family members and reviewing care plan on multidisciplinary rounds  Merton Border, MD PCCM service Mobile [phone] Pager [phone] 11/08/2018 1:10 PM

## 2018-11-09 ENCOUNTER — Inpatient Hospital Stay: Payer: Medicare Other

## 2018-11-09 DIAGNOSIS — J95859 Other complication of respirator [ventilator]: Secondary | ICD-10-CM

## 2018-11-09 DIAGNOSIS — G9340 Encephalopathy, unspecified: Secondary | ICD-10-CM

## 2018-11-09 LAB — GLUCOSE, CAPILLARY
Glucose-Capillary: 148 mg/dL — ABNORMAL HIGH (ref 70–99)
Glucose-Capillary: 167 mg/dL — ABNORMAL HIGH (ref 70–99)
Glucose-Capillary: 168 mg/dL — ABNORMAL HIGH (ref 70–99)
Glucose-Capillary: 172 mg/dL — ABNORMAL HIGH (ref 70–99)
Glucose-Capillary: 181 mg/dL — ABNORMAL HIGH (ref 70–99)
Glucose-Capillary: 198 mg/dL — ABNORMAL HIGH (ref 70–99)

## 2018-11-09 LAB — BLOOD GAS, ARTERIAL
Acid-Base Excess: 1.6 mmol/L (ref 0.0–2.0)
Bicarbonate: 29.2 mmol/L — ABNORMAL HIGH (ref 20.0–28.0)
FIO2: 0.4
MECHVT: 500 mL
Mechanical Rate: 15
O2 Saturation: 97.6 %
PEEP: 5 cmH2O
Patient temperature: 37
pCO2 arterial: 58 mmHg — ABNORMAL HIGH (ref 32.0–48.0)
pH, Arterial: 7.31 — ABNORMAL LOW (ref 7.350–7.450)
pO2, Arterial: 106 mmHg (ref 83.0–108.0)

## 2018-11-09 LAB — COMPREHENSIVE METABOLIC PANEL
ALT: 25 U/L (ref 0–44)
AST: 17 U/L (ref 15–41)
Albumin: 2.9 g/dL — ABNORMAL LOW (ref 3.5–5.0)
Alkaline Phosphatase: 41 U/L (ref 38–126)
Anion gap: 9 (ref 5–15)
BUN: 53 mg/dL — ABNORMAL HIGH (ref 8–23)
CO2: 27 mmol/L (ref 22–32)
Calcium: 8.2 mg/dL — ABNORMAL LOW (ref 8.9–10.3)
Chloride: 112 mmol/L — ABNORMAL HIGH (ref 98–111)
Creatinine, Ser: 0.88 mg/dL (ref 0.44–1.00)
GFR calc Af Amer: >60 mL/min (ref >60)
GFR calc non Af Amer: >60 mL/min (ref >60)
Glucose, Bld: 173 mg/dL — ABNORMAL HIGH (ref 70–99)
Potassium: 5 mmol/L (ref 3.5–5.1)
Sodium: 148 mmol/L — ABNORMAL HIGH (ref 135–145)
Total Bilirubin: 0.4 mg/dL (ref 0.3–1.2)
Total Protein: 5.6 g/dL — ABNORMAL LOW (ref 6.5–8.1)

## 2018-11-09 LAB — CBC
HCT: 37.7 % (ref 36.0–46.0)
Hemoglobin: 10.8 g/dL — ABNORMAL LOW (ref 12.0–15.0)
MCH: 28.2 pg (ref 26.0–34.0)
MCHC: 28.6 g/dL — ABNORMAL LOW (ref 30.0–36.0)
MCV: 98.4 fL (ref 80.0–100.0)
Platelets: 141 10*3/uL — ABNORMAL LOW (ref 150–400)
RBC: 3.83 MIL/uL — ABNORMAL LOW (ref 3.87–5.11)
RDW: 16.3 % — ABNORMAL HIGH (ref 11.5–15.5)
WBC: 8.5 10*3/uL (ref 4.0–10.5)
nRBC: 0.6 % — ABNORMAL HIGH (ref 0.0–0.2)

## 2018-11-09 LAB — BRAIN NATRIURETIC PEPTIDE: B Natriuretic Peptide: 543 pg/mL — ABNORMAL HIGH (ref 0.0–100.0)

## 2018-11-09 MED ORDER — METOPROLOL TARTRATE 50 MG PO TABS
50.0000 mg | ORAL_TABLET | Freq: Two times a day (BID) | ORAL | Status: DC
  Administered 2018-11-09 – 2018-11-10 (×3): 50 mg
  Filled 2018-11-09 (×3): qty 1

## 2018-11-09 MED ORDER — MIDAZOLAM HCL 2 MG/2ML IJ SOLN
INTRAMUSCULAR | Status: AC
  Administered 2018-11-09: 4 mg
  Filled 2018-11-09: qty 4

## 2018-11-09 MED ORDER — DEXTROSE 5 % IV SOLN
INTRAVENOUS | Status: DC
  Administered 2018-11-09: 10:00:00 50 mL/h via INTRAVENOUS

## 2018-11-09 MED ORDER — IPRATROPIUM-ALBUTEROL 0.5-2.5 (3) MG/3ML IN SOLN
3.0000 mL | Freq: Four times a day (QID) | RESPIRATORY_TRACT | Status: DC
  Administered 2018-11-09 – 2018-11-11 (×8): 3 mL via RESPIRATORY_TRACT
  Filled 2018-11-09 (×8): qty 3

## 2018-11-09 MED ORDER — METOPROLOL TARTRATE 5 MG/5ML IV SOLN
2.5000 mg | INTRAVENOUS | Status: DC | PRN
  Administered 2018-11-10 (×2): 2.5 mg via INTRAVENOUS
  Administered 2018-11-11: 5 mg via INTRAVENOUS
  Filled 2018-11-09 (×2): qty 5

## 2018-11-09 MED ORDER — METOPROLOL TARTRATE 5 MG/5ML IV SOLN
INTRAVENOUS | Status: AC
  Filled 2018-11-09: qty 5

## 2018-11-09 MED ORDER — VECURONIUM BROMIDE 10 MG IV SOLR
INTRAVENOUS | Status: AC
  Filled 2018-11-09: qty 10

## 2018-11-09 MED ORDER — FUROSEMIDE 10 MG/ML IJ SOLN
40.0000 mg | Freq: Once | INTRAMUSCULAR | Status: AC
  Administered 2018-11-09: 40 mg via INTRAVENOUS
  Filled 2018-11-09: qty 4

## 2018-11-09 MED ORDER — MIDAZOLAM HCL 2 MG/2ML IJ SOLN: 4.0000 mg | Freq: Once | INTRAMUSCULAR | Status: AC

## 2018-11-09 MED ORDER — FREE WATER
250.0000 mL | Status: DC
  Administered 2018-11-09 – 2018-11-10 (×6): 250 mL

## 2018-11-09 MED ORDER — STERILE WATER FOR INJECTION IJ SOLN
INTRAMUSCULAR | Status: AC
  Administered 2018-11-09: 10 mL
  Filled 2018-11-09: qty 10

## 2018-11-09 MED ORDER — DEXMEDETOMIDINE HCL IN NACL 400 MCG/100ML IV SOLN
0.5000 ug/kg/h | INTRAVENOUS | Status: DC
  Administered 2018-11-09 – 2018-11-10 (×6): 1 ug/kg/h via INTRAVENOUS
  Filled 2018-11-09 (×8): qty 100

## 2018-11-09 MED ORDER — VECURONIUM BROMIDE 10 MG IV SOLR
10.0000 mg | Freq: Once | INTRAVENOUS | Status: AC
  Administered 2018-11-09: 10 mg via INTRAVENOUS
  Filled 2018-11-09: qty 10

## 2018-11-09 MED ORDER — VECURONIUM BROMIDE 10 MG IV SOLR
8.0000 mg | Freq: Once | INTRAVENOUS | Status: AC
  Administered 2018-11-09: 09:00:00 8 mg via INTRAVENOUS

## 2018-11-09 MED ORDER — DOCUSATE SODIUM 50 MG/5ML PO LIQD
50.0000 mg | Freq: Two times a day (BID) | ORAL | Status: DC
  Administered 2018-11-09 – 2018-11-10 (×2): 50 mg
  Filled 2018-11-09 (×2): qty 10

## 2018-11-09 MED ORDER — METOPROLOL TARTRATE 5 MG/5ML IV SOLN
5.0000 mg | Freq: Once | INTRAVENOUS | Status: AC
  Administered 2018-11-09: 5 mg via INTRAVENOUS

## 2018-11-09 MED ORDER — SENNOSIDES 8.8 MG/5ML PO SYRP
5.0000 mL | ORAL_SOLUTION | Freq: Two times a day (BID) | ORAL | Status: DC
  Administered 2018-11-09 – 2018-11-10 (×2): 5 mL
  Filled 2018-11-09 (×3): qty 5

## 2018-11-09 MED ORDER — FREE WATER
250.0000 mL | Freq: Three times a day (TID) | Status: DC
  Administered 2018-11-09: 250 mL

## 2018-11-09 NOTE — Progress Notes (Signed)
Consult noted for goals of care. Patient remains intubated on sedation. Unable to follow commands. Assessed at bedside.   Voicemail left with son, Jorja Loa  today.   Detailed note and recommendations once GOC has been completed with family.    Thank you for allowing Palliative Medicine team to be involved in patient's care.   NO Charge

## 2018-11-09 NOTE — TOC Initial Note (Signed)
Transition of Care [facility]) - Initial/Assessment Note    Patient Details  Name: [name] [name] MRN: [id number] Date of Birth: Nov 13, 1945  Transition of [facility]) CM/SW Contact:    Chapman Fitch, RN Phone Number: 11/09/2018, 3:22 PM  Clinical Narrative:                    Patient currently intubated and sedated.  Patient was admitted for COPD and questionable PNA.  Message left for Son Pricilla Handler to complete assessment.   Per chart review PCP Hande.  Patient lives at home with children and has RW.  Patient was previously open with [facility].  Was closed for services 3/24.   Palliative consult pending.      Patient Goals and CMS Choice        Expected Discharge Plan and Services                                    Prior Living Arrangements/Services                       Activities of Daily Living      Permission Sought/Granted                  Emotional Assessment              Admission diagnosis:  Acute respiratory failure with hypoxia and hypercapnia (HCC) [J96.01, J96.02] Community acquired pneumonia of right lower lobe of lung (HCC) [J18.1] Pneumonia [J18.9] Patient Active Problem List   Diagnosis Date Noted  . Pneumonia 11-14-2018  . Atrial fibrillation with RVR (HCC) 08/02/2018  . COPD exacerbation (HCC) 06/16/2018  . Severe mitral regurgitation 11/10/2017  . OA (osteoarthritis) of knee 11/10/2017  . CKD (chronic kidney disease) stage 3, GFR 30-59 ml/min (HCC) 08/20/2017  . Insomnia 07/22/2017  . Hyperlipidemia 07/01/2017  . DDD (degenerative disc disease), lumbar 04/30/2017  . B12 deficiency 04/30/2017  . Gait instability 03/31/2017  . Allergic rhinitis 03/31/2017  . Nausea 03/31/2017  . GERD (gastroesophageal reflux disease) 03/31/2017  . Chronic systolic heart failure (HCC) 03/24/2017  . HTN (hypertension) 03/24/2017  . COPD (chronic obstructive pulmonary disease) (HCC) 03/24/2017  . Chronic pain  03/24/2017  . MRSA carrier 03/18/2017  . History of ventricular tachycardia 03/18/2017  . Vitamin D deficiency 01/06/2017  . Chronic atrial fibrillation 06/15/2016  . Depression 06/15/2016  . Varicose veins of lower extremity with inflammation 09/07/2011   PCP:  Barbette Reichmann, MD Pharmacy:   [facility] [phone] - [place], [place] - [address] AT [facility] OF [address] [place] [address] Phone: [phone] Fax: [phone]  [facility] #[id number] - [place] Ditto,  - [address] AT [facility] OF [address] [place] [address] [place] [address] Phone: [phone] Fax: [phone]  [facility] [place], [place] - [address] [place] [address] Phone: [phone] Fax: [phone]     Social Determinants of Health (SDOH) Interventions    Readmission Risk Interventions Readmission Risk Prevention Plan 06/19/2018  Transportation Screening Complete  PCP or Specialist Appt within 3-5 Days Complete  Home Care Screening Complete  HRI or Home Care Consult Complete  Social Work Consult for Recovery Care Planning/Counseling Not Complete  Palliative Care Screening Patient refused  Medication Review (RN  Care Manager) Complete  Some recent data might be hidden

## 2018-11-09 NOTE — Progress Notes (Signed)
PCCM ROUNDING NOTE  INITIAL PRESENTATION: 73 y.o. F with history of COPD, dilated cardiomyopathy (LVEF 25-30% by echo 2018), mitral regurgitation presented to [facility] ED via EMS with altered mental status and hypoxemic respiratory failure.  Intubated in ED  MAJOR EVENTS/TEST RESULTS: 04/11 admission as documented above 04/12 failed SBT 04/13 Failed brief SBT. PSV weaning initiated  INDWELLING DEVICES:: ETT 04/11 >>    MICRO DATA: MRSA PCR 4/11>> NEG Urine 4/11 >> NEG Resp 4/11>> consistent with normal respiratory flora Blood 4/11>> 1/2 E coli (ESBL), 1/2 coag neg staph (contaminant) COVID-19 04/11 >> negative  PCT 04/11 0.54, 04/13: 27.10,  04/14: 19.35  ANTIMICROBIALS:  Azithromycin 04/11 >> 04/12 Ceftriaxone 04/11 >> 04/12 Vancomycin 04/12 X 1 Meropenem 04/12 >>     SUBJ: Very dyssynchronous with ventilator this morning with extremely prolonged, Valsalva-like exhalation prohibiting mechanical ventilation breaths from being delivered.  This has now improved with discontinuation of propofol and transition to dexmedetomidine.  Presently synchronous, RASS -4  OBJ: Vitals:   11/09/18 1014 11/09/18 1200 11/09/18 1300 11/09/18 1357  BP: (!) 166/90 (!) 152/75 (!) 158/84   Pulse: 95 79 77 66  Resp:  15 17   Temp:  98.4 F (36.9 C)    TempSrc:  Oral    SpO2:  98% 94%   Weight:      Height:        Vent Mode: PRVC FiO2 (%):  [40 %] 40 % Set Rate:  [15 bmp] 15 bmp Vt Set:  [500 mL] 500 mL PEEP:  [5 cmH20] 5 cmH20 Pressure Support:  [8 cmH20] 8 cmH20 Plateau Pressure:  [10 cmH20] 10 cmH20   Intake/Output Summary (Last 24 hours) at 11/09/2018 1413 Last data filed at 11/09/2018 1302 Gross per 24 hour  Intake 1121.76 ml  Output 2420 ml  Net -1298.24 ml     Gen: RASS -4, intubated, synchronous HEENT: NCAT, sclerae white Neck: No JVD noted Lungs: Distant breath sounds without wheezes or other adventitious sounds Cardiovascular: Regular, soft systolic M Abdomen:  Obese, soft, NT, + BS Ext: Warm, no edema Neuro: Cranial nerves grossly intact, moves all extremities spontaneously Skin: Limited exam, no lesions noted   BMP Latest Ref Rng & Units 11/09/2018 11/08/2018 11/07/2018  Glucose 70 - 99 mg/dL 173(H) 172(H) 172(H)  BUN 8 - 23 mg/dL 53(H) 31(H) 18  Creatinine 0.44 - 1.00 mg/dL 0.88 1.09(H) 0.82  BUN/Creat Ratio 12 - 28 - - -  Sodium 135 - 145 mmol/L 148(H) 145 146(H)  Potassium 3.5 - 5.1 mmol/L 5.0 4.5 4.0  Chloride 98 - 111 mmol/L 112(H) 111 115(H)  CO2 22 - 32 mmol/L _0 Calcium 8.9 - 10.3 mg/dL 8.2(L) 7.4(L) 7.1(L)    CBC Latest Ref Rng & Units 11/09/2018 11/08/2018 11/07/2018  WBC 4.0 - 10.5 K/uL 8.5 7.6 8.9  Hemoglobin 12.0 - 15.0 g/dL 10.8(L) 9.3(L) 10.6(L)  Hematocrit 36.0 - 46.0 % 37.7 31.7(L) 35.8(L)  Platelets 150 - 400 K/uL 141(L) 134(L) 118(L)    Hepatic Function Latest Ref Rng & Units 11/09/2018 11/08/2018 10/29/2018  Total Protein 6.5 - 8.1 g/dL 5.6(L) 5.3(L) 6.7  Albumin 3.5 - 5.0 g/dL 2.9(L) 2.5(L) 3.3(L)  AST 15 - 41 U/L 17 14(L) 22  ALT 0 - 44 U/L _1 Alk Phosphatase 38 - 126 U/L 41 43 60  Total Bilirubin 0.3 - 1.2 mg/dL 0.4 0.5 0.7    Cardiac Panel (last 3 results) No results for input(s): CKTOTAL, CKMB, TROPONINI, RELINDX in the  last 72 hours.  CXR: Cardiomegaly, very mild interstitial prominence/edema    IMPRESSION/PLAN: PULMONARY: Acute/chronic hypoxemic/hypercapnic respiratory failure Former smoker with history of COPD Vague RLL opacity, possible pneumonia COVID-19 ruled out   Cont vent support - settings reviewed and/or adjusted Continue to wean in PSV mode as tolerated Cont vent bundle Daily SBT if/when meets criteria Continue nebulized steroids and bronchodilators ordered Discontinue methylprednisolone as it might be contributing to agitation  CARDIOVASCULAR: History of dilated cardiomyopathy History of severe MR  Continue ICU hemodynamic monitoring MAP goal >65  mmHg  RENAL: CKD Mild AKI Mild hypernatremia  Monitor BMET intermittently Monitor I/Os Correct electrolytes as indicated Continue free water repletion per tube D5W ordered 4/15  GI/NUTRITION: Severe obesity  SUP: Enteral famotidine Continue TF protocol initiated 4/13  INFECTIOUS DISEASE: E coli (ESBL) bacteremia BC + for coag neg staph (likely contaminant) Possible community-acquired pneumonia Pyuria with negative urine culture Elevated PCT  Monitor temp, WBC count Micro and abx as above   ENDOCRINE: DM 2 with mild hyperglycemia  Continue moderate scale SSI every 4 hours  HEME: Mild thrombocytopenia, resolved  DVT px: Enoxaparin Monitor CBC intermittently Transfuse per usual guidelines  NEURO: Acute encephalopathy  PAD protocol: Dexmedetomidine, fentanyl infusions RASS goal -1, -2   CCM time: 45 mins The above time includes time spent in consultation with patient and/or family members and reviewing care plan on multidisciplinary rounds  Merton Border, MD PCCM service Mobile [phone] Pager [phone] 11/09/2018 2:13 PM

## 2018-11-09 NOTE — Progress Notes (Signed)
Patient appeared at start of shift with labored breathing and tachycardia.  MD at bedside.  Orders received.  Vec, Metoprolol and lasix given to patient. EKG completed. Precedex drip started.  Heart rate went into the 150s and is now in the mid 80s. Will continue to monitor.

## 2018-11-09 NOTE — Progress Notes (Signed)
Pharmacy Antibiotic Note  [name] [name] is a 73 y.o. female admitted on 10/27/2018 with bacteremia. With possible PNA as source. Patient is currently intubated. Will continue monitor renal function as patient was in AKI upon admission. Scr baseline appears to be ~0.9. Pharmacy has been consulted for Meropenem dosing.  Plan: Continue Meropenem to 1g Q 8 hours. D4 of appropriate therapy.  Height: 5\' 5"  (165.1 cm) Weight: 248 lb 14.4 oz (112.9 kg) IBW/kg (Calculated) : 57  Temp (24hrs), Avg:98.6 F (37 C), Min:98 F (36.7 C), Max:99.5 F (37.5 C)  Recent Labs  Lab 11/20/2018 0921 10/28/2018 1210 11/06/18 0451 11/07/18 0408 11/08/18 0513 11/09/18 0318  WBC 10.8*  --  10.6* 8.9 7.6 8.5  CREATININE 1.27*  --  0.89 0.82 1.09* 0.88  LATICACIDVEN 3.0* 1.2  --   --   --   --     Estimated Creatinine Clearance: 70.3 mL/min (by C-G formula based on SCr of 0.88 mg/dL).    Allergies  Allergen Reactions  . Sulfa Antibiotics Itching    Pt states she gets "itching inside and out"  . Cymbalta [Duloxetine Hcl] Nausea Only    Not allergic  . Tramadol Hcl Other (See Comments)    Not allergic    Antimicrobials this admission: Meropenem 4/12 >>  Vancomycin 4/12 x 1 dose Rocephin 4/11 x 1 dose Azithromycin 4/11 x 1 dose  Dose adjustments this admission: N/A  Microbiology results: 4/11 BCx: E.coli(+) and ESBL Resistance, CoNs (likely contaminant)\ 4/11 COVID (-) 4/11 Resp Cx few GPCs, few GNRs 4/11 UCx: negative   04/11 MRSA PCR: negative  Thank you for allowing pharmacy to be a part of this patient's care.  Mauri Reading 11/09/2018 1:55 PM

## 2018-11-09 NOTE — Progress Notes (Signed)
[facility] [place] at [facility]   PATIENT NAME: [name] [name]    MR#:  [id number]  DATE OF BIRTH:  [birth year]  SUBJECTIVE:   Patient remains intubated and sedated.    Did not tolerate being off sedation  Afebrile  REVIEW OF SYSTEMS:  ROS-unable to obtain due to sedation  DRUG ALLERGIES:   Allergies  Allergen Reactions  . Sulfa Antibiotics Itching    Pt states she gets "itching inside and out"  . Cymbalta [Duloxetine Hcl] Nausea Only    Not allergic  . Tramadol Hcl Other (See Comments)    Not allergic   VITALS:  Blood pressure (!) 158/84, pulse 66, temperature 98.4 F (36.9 C), temperature source Oral, resp. rate 17, height 5\' 5"  (1.651 m), weight 112.9 kg, SpO2 94 %. PHYSICAL EXAMINATION:  Physical Exam  GENERAL:  Laying in the bed with no acute distress, sedated HEENT: Head atraumatic, normocephalic. Pupils equal, round, reactive to light and accommodation. No scleral icterus. Extraocular muscles intact. Oropharynx and nasopharynx clear.  NECK:  Supple, no jugular venous distention. No thyroid enlargement. LUNGS: Lungs are clear to auscultation bilaterally. No wheezes, crackles, rhonchi. No use of accessory muscles of respiration.  CARDIOVASCULAR: Tachycardic, regular rhythm, S1, S2 normal. No rubs, or gallops. 2/6 systolic murmur present. ABDOMEN: Soft, nontender, nondistended. Bowel sounds present.  EXTREMITIES: No pedal edema, cyanosis, or clubbing.  NEUROLOGIC:  Unable to assess PSYCHIATRIC: Unable to assess SKIN: No obvious rash, lesion, or ulcer.  LABORATORY PANEL:  Female CBC Recent Labs  Lab 11/09/18 0318  WBC 8.5  HGB 10.8*  HCT 37.7  PLT 141*   ------------------------------------------------------------------------------------------------------------------ Chemistries  Recent Labs  Lab 11/08/18 0513 11/09/18 0318  NA 145 148*  K 4.5 5.0  CL 111 112*  CO2 25 27  GLUCOSE 172* 173*  BUN 31* 53*  CREATININE 1.09* 0.88   CALCIUM 7.4* 8.2*  MG 2.6*  --   AST 14* 17  ALT 21 25  ALKPHOS 43 41  BILITOT 0.5 0.4   RADIOLOGY:  Dg Chest Port 1 View  Result Date: 11/09/2018 CLINICAL DATA:  73 year old female with history of respiratory failure. Follow-up study. EXAM: PORTABLE CHEST 1 VIEW COMPARISON:  Chest x-ray 11/07/2018. FINDINGS: An endotracheal tube is in place with tip 5.0 cm above the carina. A nasogastric tube is seen extending into the stomach, however, the tip of the nasogastric tube extends below the lower margin of the image. Lung volumes are low. Areas of mild interstitial prominence are again noted throughout the mid to lower lungs bilaterally, similar to the recent prior examination. No confluent consolidative airspace disease. No definite pleural effusions. Mild cephalization of the pulmonary vasculature. Mild cardiomegaly. The patient is rotated to the right on today's exam, resulting in distortion of the mediastinal contours and reduced diagnostic sensitivity and specificity for mediastinal pathology. Aortic atherosclerosis. Orthopedic fixation hardware in the right clavicle. IMPRESSION: 1. Unchanged radiographic appearance the chest, as above. Interstitial opacities throughout the mid to lower lungs bilaterally are favored to reflect a background of mild interstitial pulmonary edema given the patient's cardiomegaly. 2. Aortic atherosclerosis. Electronically Signed   By: Trudie Reed M.D.   On: 11/09/2018 07:26   ASSESSMENT AND PLAN:   ESBL E. coli bacteremia- pneumonia is likely source.  Urine culture with no growth. -Continue meropenem -Palliative care consulted  Acute on chronic hypoxemic hypercapnic respiratory failure-likely due to COPD exacerbation and possible pneumonia -Vent management per CCM -COVID testing negative -Continue steroids, nebulizers, bronchodilators -On  meropenem  Type 2 diabetes-blood sugars have been mildly elevated -Continue SSI  History of severe mitral  regurgitation and non-ischemic cardiomyopathy -Monitor fluid status  All the records are reviewed and case discussed with Care Management/Social Worker. Management plans discussed with the patient, family and they are in agreement.  CODE STATUS: Full Code  TOTAL TIME TAKING CARE OF THIS PATIENT: 35 minutes.   Orie Fisherman M.D on 11/09/2018 at 2:44 PM  Between 7am to 6pm - Pager - [phone]  After 6pm go to [email or web address] - Social research officer, government  [facility] [place] [facility]  Office  [phone]  CC: Primary care physician; Barbette Reichmann, MD  Note: This dictation was prepared with Dragon dictation along with smaller phrase technology. Any transcriptional errors that result from this process are unintentional.

## 2018-11-10 ENCOUNTER — Inpatient Hospital Stay: Payer: Medicare Other

## 2018-11-10 DIAGNOSIS — Z515 Encounter for palliative care: Secondary | ICD-10-CM

## 2018-11-10 DIAGNOSIS — Z7189 Other specified counseling: Secondary | ICD-10-CM

## 2018-11-10 LAB — CBC
HCT: 37.2 % (ref 36.0–46.0)
Hemoglobin: 11.2 g/dL — ABNORMAL LOW (ref 12.0–15.0)
MCH: 28.3 pg (ref 26.0–34.0)
MCHC: 30.1 g/dL (ref 30.0–36.0)
MCV: 93.9 fL (ref 80.0–100.0)
Platelets: 130 10*3/uL — ABNORMAL LOW (ref 150–400)
RBC: 3.96 MIL/uL (ref 3.87–5.11)
RDW: 15.2 % (ref 11.5–15.5)
WBC: 8.9 10*3/uL (ref 4.0–10.5)
nRBC: 0.7 % — ABNORMAL HIGH (ref 0.0–0.2)

## 2018-11-10 LAB — COMPREHENSIVE METABOLIC PANEL
ALT: 71 U/L — ABNORMAL HIGH (ref 0–44)
AST: 50 U/L — ABNORMAL HIGH (ref 15–41)
Albumin: 2.9 g/dL — ABNORMAL LOW (ref 3.5–5.0)
Alkaline Phosphatase: 45 U/L (ref 38–126)
Anion gap: 9 (ref 5–15)
BUN: 57 mg/dL — ABNORMAL HIGH (ref 8–23)
CO2: 29 mmol/L (ref 22–32)
Calcium: 8.3 mg/dL — ABNORMAL LOW (ref 8.9–10.3)
Chloride: 104 mmol/L (ref 98–111)
Creatinine, Ser: 0.84 mg/dL (ref 0.44–1.00)
GFR calc Af Amer: >60 mL/min (ref >60)
GFR calc non Af Amer: >60 mL/min (ref >60)
Glucose, Bld: 162 mg/dL — ABNORMAL HIGH (ref 70–99)
Potassium: 4.9 mmol/L (ref 3.5–5.1)
Sodium: 142 mmol/L (ref 135–145)
Total Bilirubin: 0.6 mg/dL (ref 0.3–1.2)
Total Protein: 5.6 g/dL — ABNORMAL LOW (ref 6.5–8.1)

## 2018-11-10 LAB — GLUCOSE, CAPILLARY
Glucose-Capillary: 134 mg/dL — ABNORMAL HIGH (ref 70–99)
Glucose-Capillary: 142 mg/dL — ABNORMAL HIGH (ref 70–99)
Glucose-Capillary: 109 mg/dL — ABNORMAL HIGH (ref 70–99)
Glucose-Capillary: 93 mg/dL (ref 70–99)
Glucose-Capillary: 99 mg/dL (ref 70–99)
Glucose-Capillary: 83 mg/dL (ref 70–99)

## 2018-11-10 MED ORDER — OSMOLITE 1.5 CAL PO LIQD: 1000.0000 mL | ORAL | Status: DC

## 2018-11-10 MED ORDER — ORAL CARE MOUTH RINSE
15.0000 mL | Freq: Two times a day (BID) | OROMUCOSAL | Status: DC
  Administered 2018-11-10 – 2018-11-11 (×2): 15 mL via OROMUCOSAL

## 2018-11-10 MED ORDER — FUROSEMIDE 10 MG/ML IJ SOLN
40.0000 mg | Freq: Once | INTRAMUSCULAR | Status: AC
  Administered 2018-11-10: 09:00:00 40 mg via INTRAVENOUS

## 2018-11-10 MED ORDER — PRO-STAT SUGAR FREE PO LIQD: 60.0000 mL | Freq: Four times a day (QID) | ORAL | Status: DC

## 2018-11-10 MED ORDER — FUROSEMIDE 10 MG/ML IJ SOLN
INTRAMUSCULAR | Status: AC
  Filled 2018-11-10: qty 4

## 2018-11-10 NOTE — Progress Notes (Signed)
Pharmacy Antibiotic Note  Shaivi Deboe is a 73 y.o. female admitted on 11-26-2018 with bacteremia. With possible PNA as source. Patient is currently intubated. Will continue monitor renal function as patient was in AKI upon admission. Scr baseline appears to be ~0.9. Pharmacy has been consulted for Meropenem dosing.  Plan: Continue Meropenem to 1g Q 8 hours. D5 of appropriate therapy.  Height: 5\' 5"  (165.1 cm) Weight: 248 lb 14.4 oz (112.9 kg) IBW/kg (Calculated) : 57  Temp (24hrs), Avg:98.1 F (36.7 C), Min:97.6 F (36.4 C), Max:98.4 F (36.9 C)  Recent Labs  Lab 11-26-18 0921 2018-11-26 1210 11/06/18 0451 11/07/18 0408 11/08/18 0513 11/09/18 0318 11/10/18 0302  WBC 10.8*  --  10.6* 8.9 7.6 8.5 8.9  CREATININE 1.27*  --  0.89 0.82 1.09* 0.88 0.84  LATICACIDVEN 3.0* 1.2  --   --   --   --   --     Estimated Creatinine Clearance: 73.6 mL/min (by C-G formula based on SCr of 0.84 mg/dL).    Allergies  Allergen Reactions  . Sulfa Antibiotics Itching    Pt states she gets "itching inside and out"  . Cymbalta [Duloxetine Hcl] Nausea Only    Not allergic  . Tramadol Hcl Other (See Comments)    Not allergic    Antimicrobials this admission: Meropenem 4/12 >>  Vancomycin 4/12 x 1 dose Rocephin 4/11 x 1 dose Azithromycin 4/11 x 1 dose  Dose adjustments this admission: N/A  Microbiology results: 4/11 BCx: E.coli(+) and ESBL Resistance, CoNs (likely contaminant)\ 4/11 COVID (-) 4/11 Resp Cx few GPCs, few GNRs 4/11 UCx: negative   04/11 MRSA PCR: negative  Thank you for allowing pharmacy to be a part of this patient's care.   Mauri Reading, PharmD Pharmacy Resident  11/10/2018 11:02 AM

## 2018-11-10 NOTE — Progress Notes (Signed)
PCCM ROUNDING NOTE  INITIAL PRESENTATION: 73 y.o. F with history of COPD, dilated cardiomyopathy (LVEF 25-30% by echo 2018), mitral regurgitation presented to [facility] ED via EMS with altered mental status and hypoxemic respiratory failure.  Intubated in ED  MAJOR EVENTS/TEST RESULTS: 04/11 admission as documented above 04/12 failed SBT 04/13 Failed brief SBT. PSV weaning initiated 04/16 Passed SBT. Remains somnolent. Possible extubation once cognition permits  INDWELLING DEVICES:: ETT 04/11 >>    MICRO DATA: MRSA PCR 4/11>> NEG Urine 4/11 >> NEG Resp 4/11>> consistent with normal respiratory flora Blood 4/11>> 1/2 E coli (ESBL), 1/2 coag neg staph (contaminant) COVID-19 04/11 >> negative  PCT 04/11 0.54, 04/13: 27.10,  04/14: 19.35, 04/17:   ANTIMICROBIALS:  Azithromycin 04/11 >> 04/12 Ceftriaxone 04/11 >> 04/12 Vancomycin 04/12 X 1 Meropenem 04/12 >>     SUBJ: RASS -3, -4. Tolerates PS 5 cm H2O. Presently, cognition prohibits extubation. All sedative infusions stopped  OBJ: Vitals:   11/10/18 0900 11/10/18 1000 11/10/18 1100 11/10/18 1105  BP: 116/60 (!) 113/45 (!) 126/57   Pulse: 85 76 87   Resp: (!) 23 (!) 25 (!) 24   Temp:      TempSrc:      SpO2: 96% 97% 96% 96%  Weight:      Height:        Vent Mode: PSV FiO2 (%):  [28 %-40 %] 28 % Set Rate:  [15 bmp] 15 bmp Vt Set:  [500 mL] 500 mL PEEP:  [5 cmH20] 5 cmH20 Pressure Support:  [5 cmH20] 5 cmH20 Plateau Pressure:  [18 cmH20-22 cmH20] 22 cmH20   Intake/Output Summary (Last 24 hours) at 11/10/2018 1231 Last data filed at 11/10/2018 1000 Gross per 24 hour  Intake 1720.4 ml  Output 4120 ml  Net -2399.6 ml     Gen: RASS -3, -4, intubated, Tolerates PS 5 cm H2O with Vt 450 cc and RR  HEENT: NCAT, sclerae white Neck: No JVD noted Lungs: Distant breath sounds without wheezes or other adventitious sounds Cardiovascular: Regular, soft systolic M Abdomen: Obese, soft, NT, + BS Ext: Warm, no edema Neuro:  Cranial nerves grossly intact, moves all extremities spontaneously Skin: Limited exam, no lesions noted   BMP Latest Ref Rng & Units 11/10/2018 11/09/2018 11/08/2018  Glucose 70 - 99 mg/dL 162(H) 173(H) 172(H)  BUN 8 - 23 mg/dL 57(H) 53(H) 31(H)  Creatinine 0.44 - 1.00 mg/dL 0.84 0.88 1.09(H)  BUN/Creat Ratio 12 - 28 - - -  Sodium 135 - 145 mmol/L 142 148(H) 145  Potassium 3.5 - 5.1 mmol/L 4.9 5.0 4.5  Chloride 98 - 111 mmol/L 104 112(H) 111  CO2 22 - 32 mmol/L _0 Calcium 8.9 - 10.3 mg/dL 8.3(L) 8.2(L) 7.4(L)    CBC Latest Ref Rng & Units 11/10/2018 11/09/2018 11/08/2018  WBC 4.0 - 10.5 K/uL 8.9 8.5 7.6  Hemoglobin 12.0 - 15.0 g/dL 11.2(L) 10.8(L) 9.3(L)  Hematocrit 36.0 - 46.0 % 37.2 37.7 31.7(L)  Platelets 150 - 400 K/uL 130(L) 141(L) 134(L)    Hepatic Function Latest Ref Rng & Units 11/10/2018 11/09/2018 11/08/2018  Total Protein 6.5 - 8.1 g/dL 5.6(L) 5.6(L) 5.3(L)  Albumin 3.5 - 5.0 g/dL 2.9(L) 2.9(L) 2.5(L)  AST 15 - 41 U/L 50(H) 17 14(L)  ALT 0 - 44 U/L 71(H) 25 21  Alk Phosphatase 38 - 126 U/L 45 41 43  Total Bilirubin 0.3 - 1.2 mg/dL 0.6 0.4 0.5    Cardiac Panel (last 3 results) No results for input(s): CKTOTAL,  CKMB, TROPONINI, RELINDX in the last 72 hours.  CXR: Cardiomegaly, very mild interstitial prominence/edema    IMPRESSION/PLAN: PULMONARY: Acute/chronic hypoxemic/hypercapnic respiratory failure Former smoker with history of COPD Vague RLL opacity, possible pneumonia COVID-19 ruled out   One way extubation today Supplemental O2 as needed BiPAP as needed Continue nebulized steroids and bronchodilators ordered  CARDIOVASCULAR: History of dilated cardiomyopathy (LVEF 30%) History of severe MR  Continue ICU hemodynamic monitoring MAP goal >65 mmHg  RENAL: CKD Mild AKI, resolved Mild hypernatremia, resolved  Monitor BMET intermittently Monitor I/Os Correct electrolytes as indicated Cont D5W - initiated 4/15  GI/NUTRITION: Severe  obesity  SUP: Enteral famotidine NPO post extubation until cognition permits oral intake  INFECTIOUS DISEASE: E coli (ESBL) bacteremia BC + for coag neg staph (likely contaminant) Possible community-acquired pneumonia Pyuria with negative urine culture Elevated PCT  Monitor temp, WBC count Micro and abx as above Recheck PCT AM 04/17  ENDOCRINE: DM 2 with mild hyperglycemia - controlled  Continue moderate scale SSI every 4 hours  HEME: Mild thrombocytopenia, resolved Mild ICU acquired anemia without acute blood loss  DVT px: Enoxaparin Monitor CBC intermittently Transfuse per usual guidelines  NEURO: Acute encephalopathy - likely due to sedative infusions  DC all sedatives RASS goal 0  FAMILY: Spoke with son, Octavia Bruckner. One way extubation today. Will transition to [place] if she does poorly   CCM time: 45 mins The above time includes time spent in consultation with patient and/or family members and reviewing care plan on multidisciplinary rounds  Merton Border, MD PCCM service Mobile [phone] Pager [phone] 11/10/2018 12:31 PM

## 2018-11-10 NOTE — Progress Notes (Signed)
Pt was suctioned prior to extubation fora small amout of frothy tan secretions. She was extubated and placed on a 3L nasal cannula.

## 2018-11-10 NOTE — Progress Notes (Addendum)
Consultation Note Date: 11/10/2018   Patient Name: [name] [name]  DOB: 1946/07/18  MRN: [id number]  Age / Sex: 73 y.o., female  PCP: Tracie Harrier, MD Referring Physician: Hillary Bow, MD  Reason for Consultation: Establishing goals of care  HPI/Patient Profile: [name] [name]  is a 73 y.o. female with a known history of COPD, chronic systolic CHF with ejection fraction 25 to 30%, nonischemic cardiomyopathy, severe mitral regurgitation, hypertension, lung nodules presents to the hospital brought in by EMS due to worsening shortness of breath.   Clinical Assessment and Goals of Care: Patient is resting in bed on ventilator. Spoke with son via phone. Patient lives with son and his wife. She is able to go to the bathroom herself. She feeds herself. She has help with other things like cooking and bathing.    She was admitted to [facility] 2 years ago and was placed on the ventilator 38 days and the family was advised that she would die, but she did not. She was on hospice at discharge, but she was too sedated and was not herself,and was eventually taken off hospice care. Son states she has been "coded" 3 times in the past.     We discussed her diagnosis, prognosis, GOC, EOL wishes disposition and options.  A detailed discussion was had today regarding advanced directives.  Concepts specific to code status, artifical feeding and hydration, IV antibiotics and rehospitalization were discussed.  The difference between an aggressive medical intervention path and a comfort care path was discussed.  Values and goals of care important to patient and family were attempted to be elicited. Discussed limitations of medical interventions to prolong quality of life for this patient at this time in this situation and discussed the concept of human mortality.  He states his mother's quality of life has been poor over the past few  years. She has voiced worrying that her son would be okay without her. He states she has been worried about her mortality.   He states she has suffered and does not want her to suffer any further.   He would like to proceed with 1 way extubation when medically optimized, and would like her supported medically if she does well, and transitioned to comfort care if she does not. If she does not do well but has time, he would like her discharged home with hospice. Discussed QOL, he would like her transitioned to DNR status. He does not want chest compressions,shocks, or a breathing tube for cardio/pulmonary resutation.   Dr. Alva Garnet spoke with son during my conversation, and updated him on status and plans. He confirmed DNR status and plan for 1 way extubation with him. DNR status witnessed by [facility] palliative NP.         SUMMARY OF RECOMMENDATIONS   1 way extubation when medically appropriate.  Code Status/Advance Care Planning:  DNR   Prognosis:   Unable to determine  Discharge Planning: To Be Determined      Primary Diagnoses: Present on Admission: . Pneumonia  I have reviewed the medical record, interviewed the patient and family, and examined the patient. The following aspects are pertinent.  Past Medical History:  Diagnosis Date  . Asthma   . Chronic combined systolic (congestive) and diastolic (congestive) heart failure ([place])    a. 02/2017 Echo: EF 25-30%, Gr2 DD.  Marland Kitchen COPD (chronic obstructive pulmonary disease) ([place])    a. On supplemental O2 @ home.  Marland Kitchen Hypertension   . Lung nodule   . Narrow complex tachycardia ([place])    a. 02/2017 - ? NSVT vs Afib-->placed on amio.  No OAC.  Marland Kitchen NICM (nonischemic cardiomyopathy) ([place])    a. 02/2017 Echo: Ef 25-30%, mild conc LVH, Gr2 DD, sev MR, mod dil LA;  b. 02/2016 Cath [facility]): nonobs dzs.  . Non-obstructive CAD    a. 02/2016 Cath [facility]): LM 30, LAD 20ost/p, 17m 30d, D1 50p, D2 30, D3 20, RI 20, LCX 40p, OM1 30, OM2 20, RCA 20 diffuse,  RPL2 30, RPL3 30.  .Marland KitchenSevere mitral regurgitation    a. 02/2017 Echo: Sev MR.   Social History   Socioeconomic History  . Marital status: Legally Separated    Spouse name: Not on file  . Number of children: 2  . Years of education: Not on file  . Highest education level: 10th grade  Occupational History  . Occupation: RETIRED  Social Needs  . Financial resource strain: Not hard at all  . Food insecurity:    Worry: Never true    Inability: Never true  . Transportation needs:    Medical: Yes    Non-medical: Yes  Tobacco Use  . Smoking status: Former Smoker    Packs/day: 3.00    Years: 55.00    Pack years: 165.00    Types: Cigarettes    Last attempt to quit: 2018    Years since quitting: 2.2  . Smokeless tobacco: Never Used  . Tobacco comment: smoking cessation materials not required  Substance and Sexual Activity  . Alcohol use: No  . Drug use: No  . Sexual activity: Not Currently  Lifestyle  . Physical activity:    Days per week: 0 days    Minutes per session: 0 min  . Stress: Very much  Relationships  . Social connections:    Talks on phone: Patient refused    Gets together: Patient refused    Attends religious service: Patient refused    Active member of club or organization: Patient refused    Attends meetings of clubs or organizations: Patient refused    Relationship status: Separated  Other Topics Concern  . Not on file  Social History Narrative  . Not on file   Family History  Problem Relation Age of Onset  . Heart disease Mother   . Heart disease Sister   . Cancer Sister    Scheduled Meds: . budesonide (PULMICORT) nebulizer solution  0.25 mg Nebulization Q6H  . chlorhexidine gluconate (MEDLINE KIT)  15 mL Mouth Rinse BID  . docusate  50 mg Per Tube BID  . enoxaparin (LOVENOX) injection  40 mg Subcutaneous Q24H  . famotidine  20 mg Per Tube Daily  . feeding supplement (OSMOLITE 1.5 CAL)  1,000 mL Per Tube Q24H  . feeding supplement (PRO-STAT SUGAR  FREE 64)  60 mL Per Tube QID  . free water  250 mL Per Tube Q4H  . insulin aspart  0-15 Units Subcutaneous Q4H  . insulin glargine  10 Units Subcutaneous Daily  . ipratropium-albuterol  3  mL Nebulization Q6H  . mouth rinse  15 mL Mouth Rinse 10 times per day  . metoprolol tartrate  50 mg Per Tube BID  . sennosides  5 mL Per Tube BID   Continuous Infusions: . dexmedetomidine (PRECEDEX) IV infusion 0.5 mcg/kg/hr (11/10/18 1026)  . dextrose 30 mL/hr at 11/10/18 1126  . fentaNYL infusion INTRAVENOUS 25 mcg/hr (11/10/18 0600)  . meropenem (MERREM) IV Stopped (11/10/18 0553)   PRN Meds:.acetaminophen **OR** acetaminophen, albuterol, fentaNYL (SUBLIMAZE) injection, metoprolol tartrate, midazolam, [DISCONTINUED] ondansetron **OR** ondansetron (ZOFRAN) IV Medications Prior to Admission:  Prior to Admission medications   Medication Sig Start Date End Date Taking? Authorizing Provider  acetaminophen (TYLENOL) 500 MG tablet Take 2 tablets (1,000 mg total) by mouth 3 (three) times daily. 08/02/17  Yes Plonk, Gwyndolyn Saxon, MD  albuterol (PROVENTIL HFA;VENTOLIN HFA) 108 (90 Base) MCG/ACT inhaler Inhale 2 puffs into the lungs every 6 (six) hours as needed for wheezing or shortness of breath. 12/07/17  Yes Juline Patch, MD  apixaban (ELIQUIS) 5 MG TABS tablet Take 5 mg by mouth 2 (two) times daily. 11/01/18  Yes [provider]  atorvastatin (LIPITOR) 40 MG tablet Take 1 tablet (40 mg total) by mouth daily. 12/07/17  Yes Juline Patch, MD  B Complex Vitamins (B-COMPLEX/B-12) TABS Take 1 tablet by mouth daily.   Yes [provider]  benzonatate (TESSALON) 200 MG capsule Take 1 capsule (200 mg total) by mouth 3 (three) times daily as needed for cough. 08/04/18  Yes Salary, Avel Peace, MD  carvedilol (COREG) 25 MG tablet Take 1 tablet (25 mg total) by mouth 2 (two) times daily. Patient taking differently: Take 12.5 mg by mouth 2 (two) times daily.  12/07/17  Yes Juline Patch, MD  cholecalciferol  (VITAMIN D3) 25 MCG (1000 UT) tablet Take 1,000 Units by mouth daily.   Yes [provider]  Cyanocobalamin (B-12) 1000 MCG TABS Take 1 tablet by mouth daily. 04/05/17  Yes Plonk, Gwyndolyn Saxon, MD  ferrous sulfate 325 (65 FE) MG tablet Take 325 mg by mouth daily with breakfast.   Yes [provider]  gabapentin (NEURONTIN) 600 MG tablet Take 1,200 mg by mouth 2 (two) times daily. 04/28/18  Yes [provider]  ipratropium-albuterol (DUONEB) 0.5-2.5 (3) MG/3ML SOLN Take 3 mLs by nebulization every 6 (six) hours as needed.   Yes [provider]  losartan (COZAAR) 25 MG tablet Take 1 tablet (25 mg total) by mouth daily. 12/07/17  Yes Juline Patch, MD  mirtazapine (REMERON) 30 MG tablet Take 30 mg by mouth at bedtime.  03/24/18  Yes [provider]  pantoprazole (PROTONIX) 40 MG tablet Take 40 mg by mouth 2 (two) times daily. 06/03/18  Yes [provider]  predniSONE (DELTASONE) 20 MG tablet Take 2 tablets (40 mg total) by mouth daily with breakfast. 08/05/18  Yes Salary, Montell D, MD  promethazine (PHENERGAN) 25 MG tablet Take 0.5 tablets (12.5 mg total) by mouth every 6 (six) hours as needed for nausea. 08/16/17  Yes Plonk, Gwyndolyn Saxon, MD  sertraline (ZOLOFT) 50 MG tablet Take 50 mg by mouth daily. 04/22/18  Yes [provider]  torsemide (DEMADEX) 20 MG tablet Take 20 mg by mouth daily. 04/22/18  Yes [provider]  traMADol (ULTRAM) 50 MG tablet Take 50 mg by mouth 2 (two) times daily.  04/13/18  Yes [provider]  umeclidinium-vilanterol (ANORO ELLIPTA) 62.5-25 MCG/INH AEPB Inhale 1 puff into the lungs daily. 08/18/18  Yes [provider]  Allergies  Allergen Reactions  . Sulfa Antibiotics Itching    Pt states she gets "itching inside and out"  . Cymbalta [Duloxetine Hcl] Nausea Only    Not allergic  . Tramadol Hcl Other (See Comments)    Not allergic   Review of Systems  Unable to perform ROS   Physical Exam  Constitutional:      Comments: On vent  Pulmonary:     Comments: Vent    Vital Signs: BP (!) 113/45   Pulse 76   Temp 98.1 F (36.7 C) (Oral)   Resp (!) 25   Ht _0  (1.651 m)   Wt 112.9 kg   SpO2 96%   BMI 41.42 kg/m  Pain Scale: CPOT POSS *See Group Information*: S-Acceptable,Sleep, easy to arouse Pain Score: 0-No pain   SpO2: SpO2: 96 % O2 Device:SpO2: 96 % O2 Flow Rate: .   IO: Intake/output summary:   Intake/Output Summary (Last 24 hours) at 11/10/2018 1142 Last data filed at 11/10/2018 1000 Gross per 24 hour  Intake 1720.4 ml  Output 5280 ml  Net -3559.6 ml    LBM: Last BM Date: (PTA) Baseline Weight: Weight: 105 kg Most recent weight: Weight: 112.9 kg     Palliative Assessment/Data:   Flowsheet Rows     Most Recent Value  Intake Tab  Referral Department  Critical care  Unit at Time of Referral  ICU  Palliative Care Primary Diagnosis  Pulmonary  Date Notified  11/07/18  Palliative Care Type  New Palliative care  Reason for referral  Clarify Goals of Care  Date of Admission  11/03/2018  # of days IP prior to Palliative referral  2  Clinical Assessment  Psychosocial & Spiritual Assessment  Palliative Care Outcomes      Time In: 12:05 Time Out: 1:15 Time Total: 70 min Greater than 50%  of this time was spent counseling and coordinating care related to the above assessment and plan.  Signed by: Asencion Gowda, NP   Please contact Palliative Medicine Team phone at [phone] for questions and concerns.  For individual provider: See Shea Evans

## 2018-11-10 NOTE — Progress Notes (Signed)
Nutrition Follow-up  RD working remotely.  DOCUMENTATION CODES:   Morbid obesity  INTERVENTION:  Initiate new goal TF regimen of Osmolite 1.5 at 20 mL/hr (480 mL goal daily volume) + Pro-Stat 60 mL QID per tube. Provides 1520 kcal, 150 grams of protein, 365 mL H2O daily.  Continue liquid MVI daily per tube.  With free water flush of 250 mL Q4hrs patient receiving a total of 1865 mL H2O daily including water in tube feeding.  NUTRITION DIAGNOSIS:   Inadequate oral intake related to inability to eat as evidenced by NPO status.  Ongoing - addressing with TF regimen.  GOAL:   Provide needs based on ASPEN/SCCM guidelines  Met with TF regimen.  MONITOR:   Vent status, Labs, Weight trends, TF tolerance, I & O's  REASON FOR ASSESSMENT:   Ventilator, Consult Enteral/tube feeding initiation and management  ASSESSMENT:   73 year old female with PMHx of COPD, HTN, chronic combined systolic and diastolic CHF, severe mitral regurgitation, CAD, asthma admitted with AMS and hypoxemic respiratory requiring intubation on 4/11, found to be negative for COVID-19.  Patient remains intubated and sedated. On PRVC mode with FiO2 40% and PEEP 5 cmH2O. Abdomen distended and taut per RN assessment. Patient has still not had a BM this admission.  Enteral Access: 16 Fr. OGT placed 4/11; terminates in mid body of stomach per abdominal x-ray 4/11; 58 cm at corner of mouth  MAP: 91-99 mmHg  TF: pt tolerating VHP @ 25 mL/hr + Pro-Stat 90 mL BID + FWF 250 mL Q4hrs  Patient is currently intubated on ventilator support Ve: 11 L/min Temp (24hrs), Avg:98.1 F (36.7 C), Min:97.6 F (36.4 C), Max:98.4 F (36.9 C)  Propofol: N/A  Medications reviewed and include: Colace 50 mg BID per tube, famotidine, Novolog 0-15 units Q4hrs, Lantus 10 units daily, sennosides 5 mL BID, Precedex gtt, D5W @ 50 mL/hr (60 grams dextrose, 204 kcal daily), fentanyl gtt, meropenem.  Labs reviewed: CBG 134-181, Sodium 142,  BUN 57.  I/O: 3490 mL UOP yesterday (1.3 mL/kg/hr)  Diet Order:   Diet Order    None     EDUCATION NEEDS:   No education needs have been identified at this time  Skin:  Skin Assessment: Reviewed RN Assessment  Last BM:  Unknown/PTA  Height:   Ht Readings from Last 1 Encounters:  11/01/2018 _0  (1.651 m)   Weight:   Wt Readings from Last 1 Encounters:  11/06/18 112.9 kg   Ideal Body Weight:  56.8 kg  BMI:  Body mass index is 41.42 kg/m.  Estimated Nutritional Needs:   Kcal:  1599-6895 (11-14 kcal/kg)  Protein:  142 grams (2.5 grams/kg IBW)  Fluid:  1.4 L/day (25 mL/kg IBW)  Willey Blade, MS, RD, LDN Office: [phone] Pager: [phone] After Hours/Weekend Pager: [phone]

## 2018-11-10 NOTE — Progress Notes (Signed)
[facility] [place] [facility]   PATIENT NAME: [name] [name]    MR#:  [id number]  DATE OF BIRTH:  March 29, 1946  SUBJECTIVE:   Patient remains intubated and sedated.    Did not tolerate being off sedation  Afebrile  REVIEW OF SYSTEMS:  ROS-unable to obtain due to sedation  DRUG ALLERGIES:   Allergies  Allergen Reactions  . Sulfa Antibiotics Itching    Pt states she gets "itching inside and out"  . Cymbalta [Duloxetine Hcl] Nausea Only    Not allergic  . Tramadol Hcl Other (See Comments)    Not allergic   VITALS:  Blood pressure (!) 126/57, pulse 87, temperature 98.1 F (36.7 C), temperature source Oral, resp. rate (!) 24, height 5\' 5"  (1.651 m), weight 112.9 kg, SpO2 96 %. PHYSICAL EXAMINATION:  Physical Exam  GENERAL:  Laying in the bed with no acute distress, sedated HEENT: Head atraumatic, normocephalic. Pupils equal, round, reactive to light and accommodation. No scleral icterus. Extraocular muscles intact. Oropharynx and nasopharynx clear.  NECK:  Supple, no jugular venous distention. No thyroid enlargement. LUNGS: Lungs are clear to auscultation bilaterally. No wheezes, crackles, rhonchi. No use of accessory muscles of respiration.  CARDIOVASCULAR: Tachycardic, regular rhythm, S1, S2 normal. No rubs, or gallops. 2/6 systolic murmur present. ABDOMEN: Soft, nontender, nondistended. Bowel sounds present.  EXTREMITIES: No pedal edema, cyanosis, or clubbing.  NEUROLOGIC:  Unable to assess PSYCHIATRIC: Unable to assess SKIN: No obvious rash, lesion, or ulcer.  LABORATORY PANEL:  Female CBC Recent Labs  Lab 11/10/18 0302  WBC 8.9  HGB 11.2*  HCT 37.2  PLT 130*   ------------------------------------------------------------------------------------------------------------------ Chemistries  Recent Labs  Lab 11/08/18 0513  11/10/18 0302  NA 145   < > 142  K 4.5   < > 4.9  CL 111   < > 104  CO2 25   < > 29  GLUCOSE 172*   < > 162*  BUN 31*    < > 57*  CREATININE 1.09*   < > 0.84  CALCIUM 7.4*   < > 8.3*  MG 2.6*  --   --   AST 14*   < > 50*  ALT 21   < > 71*  ALKPHOS 43   < > 45  BILITOT 0.5   < > 0.6   < > = values in this interval not displayed.   RADIOLOGY:  Dg Chest Port 1 View  Result Date: 11/10/2018 CLINICAL DATA:  Respiratory failure. EXAM: PORTABLE CHEST 1 VIEW COMPARISON:  One-view chest x-ray 11/09/2018 FINDINGS: Heart size is exaggerated by low lung volumes. Endotracheal tube terminates 4 cm above the carina, in satisfactory position. Interstitial prominence is stable. IMPRESSION: 1. Stable bilateral interstitial disease. 2. Support apparatus stable. Electronically Signed   By: Marin Roberts M.D.   On: 11/10/2018 06:29   ASSESSMENT AND PLAN:   ESBL E. coli bacteremia- pneumonia is likely source.  Urine culture with no growth. -Continue meropenem -Palliative care consulted  Acute on chronic hypoxemic hypercapnic respiratory failure-likely due to COPD exacerbation and possible pneumonia -Vent management per CCM -COVID testing negative -Continue steroids, nebulizers, bronchodilators -On IV abx  Plan is to extubated later and not reintubate.  Type 2 diabetes-blood sugars have been mildly elevated -Continue SSI  History of severe mitral regurgitation and non-ischemic cardiomyopathy -Monitor fluid status  All the records are reviewed and case discussed with Care Management/Social Worker. Management plans discussed with the patient, family and they are in agreement.  CODE STATUS: Full Code  TOTAL TIME TAKING CARE OF THIS PATIENT: 35 minutes.   Orie Fisherman M.D on 11/10/2018 at 12:47 PM  Between 7am to 6pm - Pager - [phone]  After 6pm go to [email or web address] - Social research officer, government  [facility] [place] [facility]  Office  [phone]  CC: Primary care physician; Barbette Reichmann, MD  Note: This dictation was prepared with Dragon dictation along with smaller phrase technology. Any  transcriptional errors that result from this process are unintentional.

## 2018-11-11 DIAGNOSIS — J9602 Acute respiratory failure with hypercapnia: Secondary | ICD-10-CM

## 2018-11-11 DIAGNOSIS — J9601 Acute respiratory failure with hypoxia: Secondary | ICD-10-CM

## 2018-11-11 DIAGNOSIS — J156 Pneumonia due to other aerobic Gram-negative bacteria: Secondary | ICD-10-CM

## 2018-11-11 DIAGNOSIS — I5023 Acute on chronic systolic (congestive) heart failure: Secondary | ICD-10-CM

## 2018-11-11 LAB — CBC
HCT: 40.9 % (ref 36.0–46.0)
Hemoglobin: 13.2 g/dL (ref 12.0–15.0)
MCH: 28.9 pg (ref 26.0–34.0)
MCHC: 32.3 g/dL (ref 30.0–36.0)
MCV: 89.5 fL (ref 80.0–100.0)
Platelets: 160 10*3/uL (ref 150–400)
RBC: 4.57 MIL/uL (ref 3.87–5.11)
RDW: 15.8 % — ABNORMAL HIGH (ref 11.5–15.5)
WBC: 10.1 10*3/uL (ref 4.0–10.5)
nRBC: 0.4 % — ABNORMAL HIGH (ref 0.0–0.2)

## 2018-11-11 LAB — GLUCOSE, CAPILLARY
Glucose-Capillary: 97 mg/dL (ref 70–99)
Glucose-Capillary: 112 mg/dL — ABNORMAL HIGH (ref 70–99)
Glucose-Capillary: 104 mg/dL — ABNORMAL HIGH (ref 70–99)

## 2018-11-11 LAB — BASIC METABOLIC PANEL
Anion gap: 12 (ref 5–15)
BUN: 40 mg/dL — ABNORMAL HIGH (ref 8–23)
CO2: 27 mmol/L (ref 22–32)
Calcium: 8 mg/dL — ABNORMAL LOW (ref 8.9–10.3)
Chloride: 102 mmol/L (ref 98–111)
Creatinine, Ser: 0.8 mg/dL (ref 0.44–1.00)
GFR calc Af Amer: >60 mL/min (ref >60)
GFR calc non Af Amer: >60 mL/min (ref >60)
Glucose, Bld: 127 mg/dL — ABNORMAL HIGH (ref 70–99)
Potassium: 4.8 mmol/L (ref 3.5–5.1)
Sodium: 141 mmol/L (ref 135–145)

## 2018-11-11 LAB — PROCALCITONIN: Procalcitonin: 1.88 ng/mL

## 2018-11-11 MED ORDER — GLYCOPYRROLATE 1 MG PO TABS: 1.0000 mg | ORAL_TABLET | ORAL | Status: DC | PRN

## 2018-11-11 MED ORDER — AMIODARONE IV BOLUS ONLY 150 MG/100ML
150.0000 mg | Freq: Once | INTRAVENOUS | Status: AC
  Administered 2018-11-11: 06:00:00 150 mg via INTRAVENOUS

## 2018-11-11 MED ORDER — GLYCOPYRROLATE 0.2 MG/ML IJ SOLN
0.3000 mg | INTRAMUSCULAR | Status: DC | PRN
  Administered 2018-11-12 – 2018-11-13 (×2): 0.3 mg via INTRAVENOUS
  Filled 2018-11-11 (×3): qty 1.5

## 2018-11-11 MED ORDER — LORAZEPAM 2 MG/ML IJ SOLN
1.0000 mg | INTRAMUSCULAR | Status: DC | PRN
  Administered 2018-11-13: 06:00:00 1 mg via INTRAVENOUS
  Filled 2018-11-11: qty 1

## 2018-11-11 MED ORDER — POLYVINYL ALCOHOL 1.4 % OP SOLN
1.0000 [drp] | Freq: Four times a day (QID) | OPHTHALMIC | Status: DC | PRN
  Filled 2018-11-11: qty 15

## 2018-11-11 MED ORDER — MORPHINE BOLUS VIA INFUSION
2.0000 mg | INTRAVENOUS | Status: DC | PRN
  Administered 2018-11-11: 2 mg via INTRAVENOUS
  Administered 2018-11-13: 3 mg via INTRAVENOUS
  Administered 2018-11-13: 4 mg via INTRAVENOUS
  Filled 2018-11-11: qty 5

## 2018-11-11 MED ORDER — HALOPERIDOL LACTATE 5 MG/ML IJ SOLN: 0.5000 mg | INTRAMUSCULAR | Status: DC | PRN

## 2018-11-11 MED ORDER — GLYCOPYRROLATE 0.2 MG/ML IJ SOLN
0.2000 mg | INTRAMUSCULAR | Status: DC | PRN
  Filled 2018-11-11: qty 1

## 2018-11-11 MED ORDER — ACETAMINOPHEN 325 MG PO TABS: 650.0000 mg | ORAL_TABLET | Freq: Four times a day (QID) | ORAL | Status: DC | PRN

## 2018-11-11 MED ORDER — BIOTENE DRY MOUTH MT LIQD: 15.0000 mL | OROMUCOSAL | Status: DC | PRN

## 2018-11-11 MED ORDER — HALOPERIDOL 0.5 MG PO TABS: 0.5000 mg | ORAL_TABLET | ORAL | Status: DC | PRN

## 2018-11-11 MED ORDER — DILTIAZEM HCL 100 MG IV SOLR: 5.0000 mg/h | INTRAVENOUS | Status: DC

## 2018-11-11 MED ORDER — MORPHINE 100MG IN NS 100ML (1MG/ML) PREMIX INFUSION
1.0000 mg/h | INTRAVENOUS | Status: DC
  Administered 2018-11-11: 2 mg/h via INTRAVENOUS
  Administered 2018-11-12 – 2018-11-13 (×2): 5 mg/h via INTRAVENOUS
  Filled 2018-11-11 (×3): qty 100

## 2018-11-11 MED ORDER — AMIODARONE IV BOLUS ONLY 150 MG/100ML
INTRAVENOUS | Status: AC
  Administered 2018-11-11: 150 mg via INTRAVENOUS
  Filled 2018-11-11: qty 100

## 2018-11-11 MED ORDER — GLYCOPYRROLATE 0.2 MG/ML IJ SOLN: 0.2000 mg | INTRAMUSCULAR | Status: DC | PRN

## 2018-11-11 MED ORDER — LORAZEPAM 1 MG PO TABS: 1.0000 mg | ORAL_TABLET | ORAL | Status: DC | PRN

## 2018-11-11 MED ORDER — LORAZEPAM 2 MG/ML PO CONC: 1.0000 mg | ORAL | Status: DC | PRN

## 2018-11-11 MED ORDER — ACETAMINOPHEN 650 MG RE SUPP: 650.0000 mg | Freq: Four times a day (QID) | RECTAL | Status: DC | PRN

## 2018-11-11 MED ORDER — ONDANSETRON HCL 4 MG/2ML IJ SOLN: 4.0000 mg | Freq: Four times a day (QID) | INTRAMUSCULAR | Status: DC | PRN

## 2018-11-11 MED ORDER — ONDANSETRON 4 MG PO TBDP
4.0000 mg | ORAL_TABLET | Freq: Four times a day (QID) | ORAL | Status: DC | PRN
  Filled 2018-11-11: qty 1

## 2018-11-11 MED ORDER — DILTIAZEM HCL 25 MG/5ML IV SOLN
10.0000 mg | Freq: Once | INTRAVENOUS | Status: AC
  Administered 2018-11-11: 10 mg via INTRAVENOUS
  Filled 2018-11-11: qty 5

## 2018-11-11 MED ORDER — HALOPERIDOL LACTATE 2 MG/ML PO CONC: 0.5000 mg | ORAL | Status: DC | PRN

## 2018-11-11 NOTE — Progress Notes (Signed)
PCCM ROUNDING NOTE  INITIAL PRESENTATION: 73 y.o. F with history of COPD, dilated cardiomyopathy (LVEF 25-30% by echo 2018), mitral regurgitation presented to [facility] ED via EMS with altered mental status and hypoxemic respiratory failure.  Intubated in ED  MAJOR EVENTS/TEST RESULTS: 04/11 admission as documented above 04/12 failed SBT 04/13 Failed brief SBT. PSV weaning initiated 04/16 Passed SBT. Remains somnolent. Extubated with transition to comfort if decompensates  INDWELLING DEVICES:: ETT 04/11 >> 04/16   MICRO DATA: MRSA PCR 4/11>> NEG Urine 4/11 >> NEG Resp 4/11>> consistent with normal respiratory flora Blood 4/11>> 1/2 E coli (ESBL), 1/2 coag neg staph (contaminant) COVID-19 04/11 >> negative  PCT 04/11 0.54, 04/13: 27.10,  04/14: 19.35, 04/17:   ANTIMICROBIALS:  Azithromycin 04/11 >> 04/12 Ceftriaxone 04/11 >> 04/12 Vancomycin 04/12 X 1 Meropenem 04/12 >> 04/16    SUBJ: Lethargic/obtunded does not track.  Labored respirations.  Seems to go in and out of atrial for with RVR occasional ventricular tachyarrhythmias noted nonsustained.  OBJ: Vitals:   11/11/18 0500 11/11/18 0600 11/11/18 0700 11/11/18 0800  BP: (!) 138/115 131/74 (!) 142/82 (!) 147/84  Pulse: (!) 43 60 61 (!) 36  Resp: (!) 32 17 (!) 27 (!) 27  Temp:    98.4 F (36.9 C)  TempSrc:    Axillary  SpO2: 98% 98% 94% 100%  Weight:      Height:        FiO2 (%):  [32 %] 32 %   Intake/Output Summary (Last 24 hours) at 11/11/2018 1650 Last data filed at 11/11/2018 0800 Gross per 24 hour  Intake 790.85 ml  Output 1370 ml  Net -579.15 ml     Gen: Obese, acute on chronically ill-appearing, tachypneic, obtunded HEENT: NCAT, sclerae nonicteric  Neck: Thick, trachea midline unable to discern JVD due to thick neck Lungs: Distant breath sounds without wheezes, coarse breath sounds throughout, rhonchi in the upper lung zones. Cardiovascular: Irregularly irregular, initially cardiac now with episodes  of bradycardia, soft systolic M grade 2/6 Abdomen: Obese, soft, NT, + BS Ext: Warm, no edema Neuro: Obtunded Skin: Limited exam, perioral fever blisters  BMP Latest Ref Rng & Units 11/11/2018 11/10/2018 11/09/2018  Glucose 70 - 99 mg/dL 127(H) 162(H) 173(H)  BUN 8 - 23 mg/dL 40(H) 57(H) 53(H)  Creatinine 0.44 - 1.00 mg/dL 0.80 0.84 0.88  BUN/Creat Ratio 12 - 28 - - -  Sodium 135 - 145 mmol/L 141 142 148(H)  Potassium 3.5 - 5.1 mmol/L 4.8 4.9 5.0  Chloride 98 - 111 mmol/L 102 104 112(H)  CO2 22 - 32 mmol/L '27 29 27  ' Calcium 8.9 - 10.3 mg/dL 8.0(L) 8.3(L) 8.2(L)    CBC Latest Ref Rng & Units 11/11/2018 11/10/2018 11/09/2018  WBC 4.0 - 10.5 K/uL 10.1 8.9 8.5  Hemoglobin 12.0 - 15.0 g/dL 13.2 11.2(L) 10.8(L)  Hematocrit 36.0 - 46.0 % 40.9 37.2 37.7  Platelets 150 - 400 K/uL 160 130(L) 141(L)    Hepatic Function Latest Ref Rng & Units 11/10/2018 11/09/2018 11/08/2018  Total Protein 6.5 - 8.1 g/dL 5.6(L) 5.6(L) 5.3(L)  Albumin 3.5 - 5.0 g/dL 2.9(L) 2.9(L) 2.5(L)  AST 15 - 41 U/L 50(H) 17 14(L)  ALT 0 - 44 U/L 71(H) 25 21  Alk Phosphatase 38 - 126 U/L 45 41 43  Total Bilirubin 0.3 - 1.2 mg/dL 0.6 0.4 0.5    Cardiac Panel (last 3 results) No results for input(s): CKTOTAL, CKMB, TROPONINI, RELINDX in the last 72 hours.  CXR: No films today  IMPRESSION/PLAN: PULMONARY: Acute/chronic hypoxemic/hypercapnic respiratory failure Former smoker with history of COPD Vague RLL opacity, possible pneumonia COVID-19 ruled out  One way extubation performed yesterday, she is being transitioned to [place] today Transition to full comfort care Morphine infusion for comfort.  Discussed with palliative care  CARDIOVASCULAR: History of dilated cardiomyopathy (LVEF 25 %) History of severe MR She has shown poor capacity for recovery.  Transitioning to comfort care   RENAL: CKD Mild AKI, resolved Mild hypernatremia, resolved Requirement for dialysis noted.  GI/NUTRITION: Severe  obesity This issue added complexity to her management. SUP: Discontinued as being transferred to comfort measures NPO post extubation she is obtunded and unable to take p.o.  INFECTIOUS DISEASE: E coli (ESBL) bacteremia BC + for coag neg staph (likely contaminant) Possible community-acquired pneumonia Pyuria with negative urine culture Elevated PCT She has been transitioned to comfort care so all antibiotics have been discontinued   ENDOCRINE: DM 2 with mild hyperglycemia - controlled Noted no intervention as transitioning to comfort  HEME: Mild thrombocytopenia, resolved Mild ICU acquired anemia without acute blood loss   NEURO: Acute encephalopathy - likely due to severity of illness, worsening likely due to low cardiac output    FAMILY: Transitioning to comfort care.  Discussed with family.  Discussed with palliative care.   CCM time: 35 mins The above time includes time spent in consultation with patient and/or family members and reviewing care plan on multidisciplinary rounds  C.Laura Patsey Berthold, MD [place] P[facility]  11/11/2018 4:50 PM

## 2018-11-11 NOTE — Plan of Care (Signed)
  Problem: Respiratory: Goal: Ability to maintain a clear airway and adequate ventilation will improve Outcome: Progressing   Problem: Clinical Measurements: Goal: Will remain free from infection Outcome: Progressing Goal: Diagnostic test results will improve Outcome: Progressing Goal: Respiratory complications will improve Outcome: Progressing Goal: Cardiovascular complication will be avoided Outcome: Progressing   Problem: Activity: Goal: Risk for activity intolerance will decrease Outcome: Progressing   Problem: Coping: Goal: Level of anxiety will decrease Outcome: Progressing   Problem: Elimination: Goal: Will not experience complications related to bowel motility Outcome: Progressing Goal: Will not experience complications related to urinary retention Outcome: Progressing   Problem: Pain Managment: Goal: General experience of comfort will improve Outcome: Progressing   Problem: Safety: Goal: Ability to remain free from injury will improve Outcome: Progressing   Problem: Skin Integrity: Goal: Risk for impaired skin integrity will decrease Outcome: Progressing   Problem: Activity: Goal: Ability to tolerate increased activity will improve Outcome: Not Progressing   Problem: Education: Goal: Knowledge of General Education information will improve Description Including pain rating scale, medication(s)/side effects and non-pharmacologic comfort measures Outcome: Not Progressing   Problem: Health Behavior/Discharge Planning: Goal: Ability to manage health-related needs will improve Outcome: Not Progressing   Problem: Clinical Measurements: Goal: Ability to maintain clinical measurements within normal limits will improve Outcome: Not Progressing   Problem: Nutrition: Goal: Adequate nutrition will be maintained Outcome: Not Progressing

## 2018-11-11 NOTE — Progress Notes (Signed)
Confirmed with patient's son, Jorja Loa, and Crystal, Palliative NP over the telephone to go ahead and initiate comfrt measures only for patient.  Son is going to leave his home now to come to patient's bedside but wishes [place] to go ahead and make her comfortable as he "doesn't want her to suffer".  Order entered for morphine infusion.  Will administer as soon as available from Pharmacy.

## 2018-11-11 NOTE — Progress Notes (Deleted)
Daily Progress Note   Patient Name: [name] [name]       Date: 11/11/2018 DOB: 10/26/1945  Age: 73 y.o. MRN#: [id number] Attending Physician: Altamese Dilling, * Primary Care Physician: Barbette Reichmann, MD Admit Date: 09-Nov-2018  Reason for Consultation/Follow-up: Establishing goals of care and Terminal Care  Subjective: Patient is resting in bed. She is tachypneic and appears uncomfortable.  Her skin is pale in color. She has a fixed gaze on the ceiling. Her gaze remains fixed with verbal and tactile stimulus. Per nursing, she has had irregular cardiac rhythms overnight and this morning. Anticipated hospital death.  Spoke with son to update him. He and his family are returning to the hospital asap. He would like to proceed with comfort care. This was witnessed by primary RN Staci.   Morphine infusion with bolus in place for pain and dyspnea. Ativan for anxiety Haldol for agitation Robinul for excessive secretions.    Length of Stay: 6  Current Medications: Scheduled Meds:  . budesonide (PULMICORT) nebulizer solution  0.25 mg Nebulization Q6H  . ipratropium-albuterol  3 mL Nebulization Q6H  . mouth rinse  15 mL Mouth Rinse BID    Continuous Infusions: . diltiazem (CARDIZEM) infusion    . morphine      PRN Meds: albuterol, antiseptic oral rinse, [DISCONTINUED] glycopyrrolate **OR** [DISCONTINUED] glycopyrrolate **OR** glycopyrrolate, [DISCONTINUED] haloperidol **OR** [DISCONTINUED] haloperidol **OR** haloperidol lactate, [DISCONTINUED] LORazepam **OR** [DISCONTINUED] LORazepam **OR** LORazepam, metoprolol tartrate, morphine, ondansetron **OR** ondansetron (ZOFRAN) IV, polyvinyl alcohol  Physical Exam Constitutional:      Comments: Fixed gaze.   Pulmonary:     Comments:  Tachypneic            Vital Signs: BP (!) 147/84 (BP Location: Right Wrist)   Pulse (!) 36   Temp 98.4 F (36.9 C) (Axillary)   Resp (!) 27   Ht 5\' 5"  (1.651 m)   Wt 112.9 kg   SpO2 100%   BMI 41.42 kg/m  SpO2: SpO2: 100 % O2 Device: O2 Device: Nasal Cannula O2 Flow Rate: O2 Flow Rate (L/min): 2 L/min  Intake/output summary:   Intake/Output Summary (Last 24 hours) at 11/11/2018 1005 Last data filed at 11/11/2018 0800 Gross per 24 hour  Intake 1488.61 ml  Output 3170 ml  Net -1681.39 ml   LBM: Last BM Date: 11/11/18 Baseline  Weight: Weight: 105 kg Most recent weight: Weight: 112.9 kg       Palliative Assessment/Data:    Flowsheet Rows     Most Recent Value  Intake Tab  Referral Department  Critical care  Unit at Time of Referral  ICU  Palliative Care Primary Diagnosis  Pulmonary  Date Notified  11/07/18  Palliative Care Type  New Palliative care  Reason for referral  Clarify Goals of Care  Date of Admission  20-Nov-2018  # of days IP prior to Palliative referral  2  Clinical Assessment  Psychosocial & Spiritual Assessment  Palliative Care Outcomes      Patient Active Problem List   Diagnosis Date Noted  . Pneumonia 11/20/18  . Atrial fibrillation with RVR (HCC) 08/02/2018  . COPD exacerbation (HCC) 06/16/2018  . Severe mitral regurgitation 11/10/2017  . OA (osteoarthritis) of knee 11/10/2017  . CKD (chronic kidney disease) stage 3, GFR 30-59 ml/min (HCC) 08/20/2017  . Insomnia 07/22/2017  . Hyperlipidemia 07/01/2017  . DDD (degenerative disc disease), lumbar 04/30/2017  . B12 deficiency 04/30/2017  . Gait instability 03/31/2017  . Allergic rhinitis 03/31/2017  . Nausea 03/31/2017  . GERD (gastroesophageal reflux disease) 03/31/2017  . Chronic systolic heart failure (HCC) 03/24/2017  . HTN (hypertension) 03/24/2017  . COPD (chronic obstructive pulmonary disease) (HCC) 03/24/2017  . Chronic pain 03/24/2017  . MRSA carrier 03/18/2017  . History of  ventricular tachycardia 03/18/2017  . Vitamin D deficiency 01/06/2017  . Chronic atrial fibrillation 06/15/2016  . Depression 06/15/2016  . Varicose veins of lower extremity with inflammation 09/07/2011    Palliative Care Assessment & Plan   Recommendations/Plan:  Anticipated hospital death. Orders for comfort in place. Family returning to hospital.   Code Status:    Code Status Orders  (From admission, onward)         Start     Ordered   11/11/18 0952  Do not attempt resuscitation (DNR)  Continuous    Question Answer Comment  In the event of cardiac or respiratory ARREST Do not call a "code blue"   In the event of cardiac or respiratory ARREST Do not perform Intubation, CPR, defibrillation or ACLS   In the event of cardiac or respiratory ARREST Use medication by any route, position, wound care, and other measures to relive pain and suffering. May use oxygen, suction and manual treatment of airway obstruction as needed for comfort.   Comments Continue current vent until 1 way extubation. Witnessed by Dr. Sung Amabile.      11/11/18 0957        Code Status History    Date Active Date Inactive Code Status Order ID Comments User Context   11/10/2018 1315 11/11/2018 0957 DNR [id number]  Morton Stall, NP Inpatient   November 20, 2018 1141 11/10/2018 1315 Full Code [id number]  Milagros Loll, MD ED   08/03/2018 0107 08/04/2018 1649 Full Code [id number]  Oralia Manis, MD ED   06/16/2018 2214 06/19/2018 1855 Full Code [id number]  Ihor Austin, MD ED   05/29/2018 0208 05/29/2018 1534 Full Code [id number]  Oralia Manis, MD ED   03/13/2017 1205 03/18/2017 1809 Full Code [id number]  Gracelyn Nurse, MD Inpatient   06/03/2016 1045 06/05/2016 1630 Full Code [id number]  Shaune Pollack, MD Inpatient       Prognosis:   Hours - Days  Discharge Planning:  Anticipated Hospital Death  Care plan was discussed with CCM, RN  Thank you for allowing the Palliative Medicine Team to assist in the care of this  patient.   Time In: 8:50 Time Out: 10:00 Total Time 70 min Prolonged Time Billed  yes       Greater than 50%  of this time was spent counseling and coordinating care related to the above assessment and plan.  Morton Stall, NP  Please contact Palliative Medicine Team phone at [phone] for questions and concerns.

## 2018-11-11 NOTE — Progress Notes (Addendum)
Daily Progress Note   Patient Name: [name] [name]       Date: 11/11/2018 DOB: April 17, 1946  Age: 73 y.o. MRN#: [id number] Attending Physician: Altamese Dilling, * Primary Care Physician: Barbette Reichmann, MD Admit Date: 11-09-2018  Reason for Consultation/Follow-up: Establishing goals of care and Terminal Care  Subjective: Patient is resting in bed. She is tachypneic and appears uncomfortable.  Her skin is pale in color. She has a fixed gaze on the ceiling. Her gaze remains fixed with verbal and tactile stimulus. Per nursing, she has had irregular cardiac rhythms overnight and this morning. Anticipated hospital death.  Spoke with son to update him. He and his family are returning to the hospital asap. He would like to proceed with comfort care. This was witnessed by primary RN Staci.   Morphine infusion with bolus in place for pain and dyspnea. Ativan for anxiety Haldol for agitation Robinul for excessive secretions.   Addendum: Patient resting in bed with upward gaze. Morphine infusion in place.  No signs of distress noted, tachypnea subsided.  Patient's son and sisters are at bedside at this time. Questions answered. Discussed the dying process and possible time frame of hours to days, and anticipated hospital death.    Length of Stay: 6  Current Medications: Scheduled Meds:    Continuous Infusions: . morphine 4 mg/hr (11/11/18 1123)    PRN Meds: antiseptic oral rinse, [DISCONTINUED] glycopyrrolate **OR** [DISCONTINUED] glycopyrrolate **OR** glycopyrrolate, [DISCONTINUED] haloperidol **OR** [DISCONTINUED] haloperidol **OR** haloperidol lactate, [DISCONTINUED] LORazepam **OR** [DISCONTINUED] LORazepam **OR** LORazepam, morphine, ondansetron **OR** ondansetron (ZOFRAN) IV,  polyvinyl alcohol  Physical Exam Constitutional:      Comments: Fixed gaze.   Pulmonary:     Comments: Tachypneic            Vital Signs: BP (!) 147/84 (BP Location: Right Wrist)   Pulse (!) 36   Temp 98.4 F (36.9 C) (Axillary)   Resp (!) 27   Ht 5\' 5"  (1.651 m)   Wt 112.9 kg   SpO2 100%   BMI 41.42 kg/m  SpO2: SpO2: 100 % O2 Device: O2 Device: Nasal Cannula O2 Flow Rate: O2 Flow Rate (L/min): 2 L/min  Intake/output summary:   Intake/Output Summary (Last 24 hours) at 11/11/2018 1353 Last data filed at 11/11/2018 0800 Gross per 24 hour  Intake 1488.61 ml  Output  3170 ml  Net -1681.39 ml   LBM: Last BM Date: 11/11/18 Baseline Weight: Weight: 105 kg Most recent weight: Weight: 112.9 kg       Palliative Assessment/Data:    Flowsheet Rows     Most Recent Value  Intake Tab  Referral Department  Critical care  Unit at Time of Referral  ICU  Palliative Care Primary Diagnosis  Pulmonary  Date Notified  11/07/18  Palliative Care Type  New Palliative care  Reason for referral  Clarify Goals of Care  Date of Admission  03-Nov-2018  # of days IP prior to Palliative referral  2  Clinical Assessment  Psychosocial & Spiritual Assessment  Palliative Care Outcomes      Patient Active Problem List   Diagnosis Date Noted  . Pneumonia 09-Apr-202020  . Atrial fibrillation with RVR (HCC) 08/02/2018  . COPD exacerbation (HCC) 06/16/2018  . Severe mitral regurgitation 11/10/2017  . OA (osteoarthritis) of knee 11/10/2017  . CKD (chronic kidney disease) stage 3, GFR 30-59 ml/min (HCC) 08/20/2017  . Insomnia 07/22/2017  . Hyperlipidemia 07/01/2017  . DDD (degenerative disc disease), lumbar 04/30/2017  . B12 deficiency 04/30/2017  . Gait instability 03/31/2017  . Allergic rhinitis 03/31/2017  . Nausea 03/31/2017  . GERD (gastroesophageal reflux disease) 03/31/2017  . Chronic systolic heart failure (HCC) 03/24/2017  . HTN (hypertension) 03/24/2017  . COPD (chronic obstructive  pulmonary disease) (HCC) 03/24/2017  . Chronic pain 03/24/2017  . MRSA carrier 03/18/2017  . History of ventricular tachycardia 03/18/2017  . Vitamin D deficiency 01/06/2017  . Chronic atrial fibrillation 06/15/2016  . Depression 06/15/2016  . Varicose veins of lower extremity with inflammation 09/07/2011    Palliative Care Assessment & Plan   Recommendations/Plan:  Anticipated hospital death. Orders for comfort in place. Family returning to hospital.   Code Status:    Code Status Orders  (From admission, onward)         Start     Ordered   11/11/18 0952  Do not attempt resuscitation (DNR)  Continuous    Question Answer Comment  In the event of cardiac or respiratory ARREST Do not call a "code blue"   In the event of cardiac or respiratory ARREST Do not perform Intubation, CPR, defibrillation or ACLS   In the event of cardiac or respiratory ARREST Use medication by any route, position, wound care, and other measures to relive pain and suffering. May use oxygen, suction and manual treatment of airway obstruction as needed for comfort.   Comments Continue current vent until 1 way extubation. Witnessed by Dr. Sung AmabileSimonds.      11/11/18 0957        Code Status History    Date Active Date Inactive Code Status Order ID Comments User Context   11/10/2018 1315 11/11/2018 0957 DNR [id number]  Morton StallGriffin, Lilyrose Tanney, NP Inpatient   Dec 14, 2018 1141 11/10/2018 1315 Full Code [id number]  Milagros LollSudini, Srikar, MD ED   08/03/2018 0107 08/04/2018 1649 Full Code [id number]  Oralia ManisWillis, David, MD ED   06/16/2018 2214 06/19/2018 1855 Full Code [id number]  Ihor AustinPyreddy, Pavan, MD ED   05/29/2018 0208 05/29/2018 1534 Full Code [id number]  Oralia ManisWillis, David, MD ED   03/13/2017 1205 03/18/2017 1809 Full Code [id number]  Gracelyn NurseJohnston, John D, MD Inpatient   06/03/2016 1045 06/05/2016 1630 Full Code [id number]  Shaune Pollackhen, Qing, MD Inpatient       Prognosis:   Hours - Days  Discharge Planning:  Anticipated Hospital Death  Care plan was  discussed with CCM, RN  Thank you for allowing the Palliative Medicine Team to assist in the care of this patient.   Time In: 8:50 Time Out: 10:00 Total Time 70 min Prolonged Time Billed  yes       Greater than 50%  of this time was spent counseling and coordinating care related to the above assessment and plan.  Morton Stall, NP  Please contact Palliative Medicine Team phone at [phone] for questions and concerns.

## 2018-11-11 NOTE — Progress Notes (Signed)
[facility] - [place] at [facility]   PATIENT NAME: [name] [name]    MR#:  [id number]  DATE OF BIRTH:  04-07-1946  SUBJECTIVE:   Patient remains intubated and sedated.    Did not tolerate being off sedation  Afebrile Extubated and on comfort care.  Was looking in some distress with tachypnea.  REVIEW OF SYSTEMS:  ROS-unable to obtain due to comfort care and drowsiness.  DRUG ALLERGIES:   Allergies  Allergen Reactions  . Sulfa Antibiotics Itching    Pt states she gets "itching inside and out"  . Cymbalta [Duloxetine Hcl] Nausea Only    Not allergic  . Tramadol Hcl Other (See Comments)    Not allergic   VITALS:  Blood pressure (!) 147/84, pulse (!) 36, temperature 98.4 F (36.9 C), temperature source Axillary, resp. rate (!) 27, height 5\' 5"  (1.651 m), weight 112.9 kg, SpO2 100 %. PHYSICAL EXAMINATION:  Physical Exam  GENERAL:  Laying in the bed with some tachycardia and tachypnea. HEENT: Head atraumatic, normocephalic. Pupils equal, round, reactive to light , No scleral icterus.  Oropharynx and nasopharynx clear.  NECK:  Supple, no jugular venous distention. No thyroid enlargement. LUNGS: Lungs are clear to auscultation bilaterally. No wheezes, crackles, rhonchi.  Positive for use of accessory muscles of respiration.  Fast respiratory rate. CARDIOVASCULAR: Tachycardic, regular rhythm, S1, S2 normal. No rubs, or gallops. 2/6 systolic murmur present. ABDOMEN: Soft, nontender, nondistended. Bowel sounds present.  EXTREMITIES: No pedal edema, cyanosis, or clubbing.  NEUROLOGIC:  Unable to assess, drowsy. PSYCHIATRIC: Unable to assess SKIN: No obvious rash, lesion, or ulcer.  LABORATORY PANEL:  Female CBC Recent Labs  Lab 11/11/18 0630  WBC 10.1  HGB 13.2  HCT 40.9  PLT 160   ------------------------------------------------------------------------------------------------------------------ Chemistries  Recent Labs  Lab 11/08/18 0513  11/10/18 0302  11/11/18 0630  NA 145   < > 142 141  K 4.5   < > 4.9 4.8  CL 111   < > 104 102  CO2 25   < > 29 27  GLUCOSE 172*   < > 162* 127*  BUN 31*   < > 57* 40*  CREATININE 1.09*   < > 0.84 0.80  CALCIUM 7.4*   < > 8.3* 8.0*  MG 2.6*  --   --   --   AST 14*   < > 50*  --   ALT 21   < > 71*  --   ALKPHOS 43   < > 45  --   BILITOT 0.5   < > 0.6  --    < > = values in this interval not displayed.   RADIOLOGY:  No results found. ASSESSMENT AND PLAN:   ESBL E. coli bacteremia- pneumonia is likely source.  Urine culture with no growth. -Continue meropenem -Palliative care consulted  Acute on chronic hypoxemic hypercapnic respiratory failure-likely due to COPD exacerbation and possible pneumonia -Vent management per CCM -COVID testing negative -Continue steroids, nebulizers, bronchodilators -On IV abx  After having palliative care meeting patient was extubated yesterday and on comfort care now.  Type 2 diabetes-blood sugars have been mildly elevated -Continue SSI  History of severe mitral regurgitation and non-ischemic cardiomyopathy -Monitor fluid status Comfort care now.  All the records are reviewed and case discussed with Care Management/Social Worker. Management plans discussed with the patient, family and they are in agreement.  CODE STATUS: DNR  TOTAL TIME TAKING CARE OF THIS PATIENT: 25 minutes.   Altamese Dilling  M.D on 11/11/2018 at 3:31 PM  Between 7am to 6pm - Pager - [phone]  After 6pm go to [email or web address] - Social research officer, government  [facility]  Office  [phone]  CC: Primary care physician; Barbette Reichmann, MD  Note: This dictation was prepared with Dragon dictation along with smaller phrase technology. Any transcriptional errors that result from this process are unintentional.

## 2018-11-11 NOTE — TOC Progression Note (Signed)
Transition of Care [facility]) - Progression Note    Patient Details  Name: [name] [name] MRN: [id number] Date of Birth: 1946/03/31  Transition of Care [facility]) CM/SW Contact  Virgel Manifold, RN Phone Number: 11/11/2018, 11:23 AM  Clinical Narrative:   TOC team was able to discuss plan of care with primary RN and rounding Physician who had just spoke to patients son, Jorja Loa regarding disposition. TOC team staff had previously been unable to complete assessment with Tim. After a conversations with palliative medicine and ICU staff Jorja Loa has agreed to make his mother comfort care. Unfortunately, it was expressed that patient may have a hospital death and Jorja Loa would like to pursue home with hospice if his mother becomes stable enough but at this point she has been deemed "actively dying" so a transport home would not be appropriate. If a home with hospice discharge becomes a possibility TOC will assist in coordination of this.      Expected Discharge Plan: Hospice Medical Facility Barriers to Discharge: Other (comment)  Expected Discharge Plan and Services Expected Discharge Plan: Hospice Medical Facility                                   Social Determinants of Health (SDOH) Interventions    Readmission Risk Interventions Readmission Risk Prevention Plan 11/10/2018 06/19/2018  Transportation Screening - Complete  PCP or Specialist Appt within 3-5 Days - Complete  Home Care Screening - Complete  HRI or Home Care Consult - Complete  Social Work Consult for Recovery Care Planning/Counseling - Not Complete  Palliative Care Screening - Patient refused  Medication Review Oceanographer) Complete Complete  Palliative Care Screening Complete -  Some recent data might be hidden

## 2018-11-12 NOTE — Progress Notes (Signed)
   [facility] - [place] at [facility]   PATIENT NAME: [name] [name]    MR#:  [id number]  DATE OF BIRTH:  02/17/1946  SUBJECTIVE:    ON morphine drip. Not in dtstress  REVIEW OF SYSTEMS:  Review of Systems  Unable to perform ROS: Mental status change    DRUG ALLERGIES:   Allergies  Allergen Reactions  . Sulfa Antibiotics Itching    Pt states she gets "itching inside and out"  . Cymbalta [Duloxetine Hcl] Nausea Only    Not allergic  . Tramadol Hcl Other (See Comments)    Not allergic   VITALS:  Blood pressure (!) 76/57, pulse 75, temperature 98.4 F (36.9 C), temperature source Axillary, resp. rate (!) 27, height 5\' 5"  (1.651 m), weight 112.9 kg, SpO2 92 %. PHYSICAL EXAMINATION:  Physical Exam  GENERAL:  Laying in the bed with shallow breathing  LABORATORY PANEL:  Female CBC Recent Labs  Lab 11/11/18 0630  WBC 10.1  HGB 13.2  HCT 40.9  PLT 160   ------------------------------------------------------------------------------------------------------------------ Chemistries  Recent Labs  Lab 11/08/18 0513  11/10/18 0302 11/11/18 0630  NA 145   < > 142 141  K 4.5   < > 4.9 4.8  CL 111   < > 104 102  CO2 25   < > 29 27  GLUCOSE 172*   < > 162* 127*  BUN 31*   < > 57* 40*  CREATININE 1.09*   < > 0.84 0.80  CALCIUM 7.4*   < > 8.3* 8.0*  MG 2.6*  --   --   --   AST 14*   < > 50*  --   ALT 21   < > 71*  --   ALKPHOS 43   < > 45  --   BILITOT 0.5   < > 0.6  --    < > = values in this interval not displayed.   RADIOLOGY:  No results found. ASSESSMENT AND PLAN:   ESBL E. coli bacteremia- pneumonia is likely source.  Urine culture with no growth. Acute on chronic hypoxemic hypercapnic respiratory failure-likely due to COPD exacerbation and pneumonia -COVID testing negative Type 2 diabetes History of severe mitral regurgitation and non-ischemic cardiomyopathy  On c omfort measures  All the records are reviewed and case discussed with Care  Management/Social Worker. Management plans discussed with the patient, family and they are in agreement.  CODE STATUS: DNR  TOTAL TIME TAKING CARE OF THIS PATIENT: 25 minutes.   Orie Fisherman M.D on 11/12/2018 at 12:29 PM  Between 7am to 6pm - Pager - [phone]  After 6pm go to [email or web address] - Social research officer, government  [facility]  Office  [phone]  CC: Primary care physician; Barbette Reichmann, MD  Note: This dictation was prepared with Dragon dictation along with smaller phrase technology. Any transcriptional errors that result from this process are unintentional.

## 2018-11-12 NOTE — Progress Notes (Signed)
Ch visited pt that had recently been placed on comfort care. Family were already present and welcomed the visit from a ch. Family reflected and shared about their experiences growing up together. Pt was not responsive but responded to touch from family. Ch anointed pt w/ oil and read the litany for the dying from the book of prayer.        11/12/18 1405  Clinical Encounter Type  Visited With Patient and family together  Visit Type Psychological support;Spiritual support;Social support;Death  Referral From Family  Consult/Referral To Chaplain  Spiritual Encounters  Spiritual Needs Prayer;Sacred text;Ritual;Emotional;Grief support  Stress Factors  Patient Stress Factors Health changes;Major life changes  Family Stress Factors Loss

## 2018-11-25 NOTE — Death Summary Note (Signed)
DEATH SUMMARY   Patient Details  Name: [name] [name] MRN: [id number] DOB: 31-Jul-1945  Admission/Discharge Information   Admit Date:  11/30/18  Date of Death: Date of Death: 12-08-2018  Time of Death: Time of Death: 0910  Length of Stay: 8  Referring Physician: Barbette Reichmann, MD   Reason(s) for Hospitalization  Respiratory failure  Diagnoses  Preliminary cause of death:   ESBL E. coli bacteremia- pneumonia is likely source.  Urine culture with no growth. Acute on chronic hypoxemic hypercapnic respiratory failure-likely due to COPD exacerbation and pneumonia -COVID testing negative Type 2 diabetes History of severe mitral regurgitation and non-ischemic cardiomyopathy Secondary Diagnoses (including complications and co-morbidities):  Active Problems:   Pneumonia   Brief Hospital Course (including significant findings, care, treatment, and services provided and events leading to death)  ESBL E. coli bacteremia- pneumonia is likely source.  Urine culture with no growth. Acute on chronic hypoxemic hypercapnic respiratory failure-likely due to COPD exacerbation and pneumonia -COVID testing negative Type 2 diabetes History of severe mitral regurgitation and non-ischemic cardiomyopathy  Patient was admitted to ICU and had to be intubated with full ventilatory support.  Patient was started on broad-spectrum antibiotics which was later narrowed down to ESBL E. coli bacteremia.  She was given nebulizer treatments.  Followed by intensivist in the ICU.  Patient did not improve well and palliative care was consulted.  After multiple discussions family decided to terminally extubate the patient to see how she does.  Patient did not do well and was transitioned to comfort care measures on morphine drip.  She was transferred to medical floor and has been pronounced dead on 08-Dec-2018 at 9:10 AM.   Pertinent Labs and Studies  Significant Diagnostic Studies Dg Abdomen 1 View  Result Date:  11/30/2018 CLINICAL DATA:  OG tube placement at bedside. EXAM: ABDOMEN - 1 VIEW COMPARISON:  None. FINDINGS: OG tube tip projects over the mid body of the stomach. Visualized LEFT UPPER QUADRANT abdominal bowel gas pattern is normal. IMPRESSION: OG tube tip projects over the mid body of the stomach in satisfactory position. Electronically Signed   By: Hulan Saas M.D.   On: 11/30/2018 10:08   Dg Chest Port 1 View  Result Date: 11/10/2018 CLINICAL DATA:  Respiratory failure. EXAM: PORTABLE CHEST 1 VIEW COMPARISON:  One-view chest x-ray 11/09/2018 FINDINGS: Heart size is exaggerated by low lung volumes. Endotracheal tube terminates 4 cm above the carina, in satisfactory position. Interstitial prominence is stable. IMPRESSION: 1. Stable bilateral interstitial disease. 2. Support apparatus stable. Electronically Signed   By: Marin Roberts M.D.   On: 11/10/2018 06:29   Dg Chest Port 1 View  Result Date: 11/09/2018 CLINICAL DATA:  73 year old female with history of respiratory failure. Follow-up study. EXAM: PORTABLE CHEST 1 VIEW COMPARISON:  Chest x-ray 11/07/2018. FINDINGS: An endotracheal tube is in place with tip 5.0 cm above the carina. A nasogastric tube is seen extending into the stomach, however, the tip of the nasogastric tube extends below the lower margin of the image. Lung volumes are low. Areas of mild interstitial prominence are again noted throughout the mid to lower lungs bilaterally, similar to the recent prior examination. No confluent consolidative airspace disease. No definite pleural effusions. Mild cephalization of the pulmonary vasculature. Mild cardiomegaly. The patient is rotated to the right on today's exam, resulting in distortion of the mediastinal contours and reduced diagnostic sensitivity and specificity for mediastinal pathology. Aortic atherosclerosis. Orthopedic fixation hardware in the right clavicle. IMPRESSION: 1. Unchanged radiographic appearance  the chest, as  above. Interstitial opacities throughout the mid to lower lungs bilaterally are favored to reflect a background of mild interstitial pulmonary edema given the patient's cardiomegaly. 2. Aortic atherosclerosis. Electronically Signed   By: Trudie Reed M.D.   On: 11/09/2018 07:26   Dg Chest Port 1 View  Result Date: 11/07/2018 CLINICAL DATA:  73 year old female with acute respiratory failure EXAM: PORTABLE CHEST 1 VIEW COMPARISON:  11/06/2018, 10/27/2018 FINDINGS: Cardiomediastinal silhouette unchanged in size and contour. Low lung volumes. Coarsened interstitial markings without confluent airspace disease. Endotracheal tube appears slightly withdrawn, now terminating 3.4 cm above the carina. Unchanged gastric tube. No pneumothorax or large pleural effusion. Surgical changes of the right clavicle. IMPRESSION: There appears to have been interval slight withdrawal of the endotracheal tube, now terminating 3.4 cm above the carina. Unchanged appearance of low lung volumes and mild interstitial opacities. Unchanged gastric tube. Electronically Signed   By: Gilmer Mor D.O.   On: 11/07/2018 20:47   Dg Chest Port 1 View  Result Date: 11/06/2018 CLINICAL DATA:  Respiratory arrest, intubated EXAM: PORTABLE CHEST 1 VIEW COMPARISON:  Chest radiograph from one day prior. FINDINGS: Endotracheal tube tip is 1.9 cm above the carina. Enteric tube enters stomach with the tip not seen on this image. Stable cardiomediastinal silhouette with mild cardiomegaly. No pneumothorax. Possible stable trace bilateral pleural effusions. Mild pulmonary edema, slightly improved. Left basilar atelectasis, stable. IMPRESSION: 1.  Endotracheal tube tip is 1.9 cm above the carina. 2. Mild congestive heart failure, slightly improved. 3. Possible stable trace bilateral pleural effusions. 4. Stable left basilar atelectasis. Electronically Signed   By: Delbert Phenix M.D.   On: 11/06/2018 07:24   Dg Chest Port 1 View  Result Date:  11/09/2018 CLINICAL DATA:  Intubation. Current history of oxygen-dependent COPD, asthma and combined systolic and diastolic CHF. EXAM: PORTABLE CHEST 1 VIEW COMPARISON:  08/02/2018 and earlier, including CTA chest 05/28/2018. FINDINGS: Endotracheal tube tip in satisfactory position projecting approximately 3 cm above the carina. Gastric tube courses below the diaphragm into the stomach. Cardiac silhouette mildly to moderately enlarged, unchanged. Low lung volumes. Pulmonary venous hypertension and perhaps minimal interstitial pulmonary edema. Asymmetric airspace opacities at the RIGHT lung base. No visible pleural effusions. IMPRESSION: 1. Endotracheal tube tip in satisfactory position projecting approximately 3 cm above the carina. 2. Gastric tube courses below the diaphragm into the stomach. 3. Stable cardiomegaly. Pulmonary venous hypertension and perhaps minimal interstitial pulmonary edema. 4. Asymmetric airspace pulmonary edema versus pneumonia at the RIGHT lung base. Electronically Signed   By: Hulan Saas M.D.   On: 11/16/2018 10:07    Microbiology Recent Results (from the past 240 hour(s))  Blood Culture (routine x 2)     Status: Abnormal   Collection Time: 10/31/2018  9:22 AM  Result Value Ref Range Status   Specimen Description   Final    BLOOD LEFT ARM Performed at [facility], [address], [place], [place] [address]    Special Requests   Final    BOTTLES DRAWN AEROBIC AND ANAEROBIC Blood Culture results may not be optimal due to an excessive volume of blood received in culture bottles Performed at [facility], [address] [place], [place] [address]    Culture  Setup Time   Final    IN BOTH AEROBIC AND ANAEROBIC BOTTLES GRAM NEGATIVE RODS CRITICAL RESULT CALLED TO, READ BACK BY AND VERIFIED WITH: DAVID BESANTI AT 0330 ON 11/06/18 RWW Performed at [facility],  [address] [place], [place] [address]    Culture (A)  Final    ESCHERICHIA  COLI Confirmed Extended Spectrum Beta-Lactamase Producer (ESBL).  In bloodstream infections from ESBL organisms, carbapenems are preferred over piperacillin/tazobactam. They are shown to have a lower risk of mortality.    Report Status 11/08/2018 FINAL  Final   Organism ID, Bacteria ESCHERICHIA COLI  Final      Susceptibility   Escherichia coli - MIC*    AMPICILLIN >=32 RESISTANT Resistant     CEFAZOLIN >=64 RESISTANT Resistant     CEFEPIME RESISTANT Resistant     CEFTAZIDIME RESISTANT Resistant     CEFTRIAXONE RESISTANT Resistant     CIPROFLOXACIN >=4 RESISTANT Resistant     GENTAMICIN <=1 SENSITIVE Sensitive     IMIPENEM <=0.25 SENSITIVE Sensitive     TRIMETH/SULFA >=320 RESISTANT Resistant     AMPICILLIN/SULBACTAM 8 SENSITIVE Sensitive     PIP/TAZO <=4 SENSITIVE Sensitive     Extended ESBL POSITIVE Resistant     * ESCHERICHIA COLI  Blood Culture (routine x 2)     Status: Abnormal   Collection Time: 05-16-19  9:22 AM  Result Value Ref Range Status   Specimen Description   Final    BLOOD RIGHT ARM Performed at [facility], [address] [place], [place] [address]    Special Requests   Final    BOTTLES DRAWN AEROBIC AND ANAEROBIC Blood Culture results may not be optimal due to an excessive volume of blood received in culture bottles Performed at [facility], [address] [place], [place] [address]    Culture  Setup Time   Final    GRAM POSITIVE COCCI IN BOTH AEROBIC AND ANAEROBIC BOTTLES CRITICAL RESULT CALLED TO, READ BACK BY AND VERIFIED WITH: DAVID BESANTI AT [id number] 11/06/2018.PMF    Culture (A)  Final    STAPHYLOCOCCUS SPECIES (COAGULASE NEGATIVE) THE SIGNIFICANCE OF ISOLATING THIS ORGANISM FROM A SINGLE SET OF BLOOD CULTURES WHEN MULTIPLE SETS ARE DRAWN IS UNCERTAIN. PLEASE NOTIFY THE MICROBIOLOGY DEPARTMENT WITHIN ONE WEEK IF SPECIATION AND SENSITIVITIES ARE REQUIRED. Performed at [facility], [address] [place], [place] [address]     Report Status 11/08/2018 FINAL  Final  Blood Culture ID Panel (Reflexed)     Status: Abnormal   Collection Time: 05-16-19  9:22 AM  Result Value Ref Range Status   Enterococcus species NOT DETECTED NOT DETECTED Final   Listeria monocytogenes NOT DETECTED NOT DETECTED Final   Staphylococcus species NOT DETECTED NOT DETECTED Final   Staphylococcus aureus (BCID) NOT DETECTED NOT DETECTED Final   Streptococcus species NOT DETECTED NOT DETECTED Final   Streptococcus agalactiae NOT DETECTED NOT DETECTED Final   Streptococcus pneumoniae NOT DETECTED NOT DETECTED Final   Streptococcus pyogenes NOT DETECTED NOT DETECTED Final   Acinetobacter baumannii NOT DETECTED NOT DETECTED Final   Enterobacteriaceae species DETECTED (A) NOT DETECTED Final    Comment: Enterobacteriaceae represent a large family of gram-negative bacteria, not a single organism. CRITICAL RESULT CALLED TO, READ BACK BY AND VERIFIED WITH: DAVID BESANTI AT 0330 ON 11/06/18 RWW    Enterobacter cloacae complex NOT DETECTED NOT DETECTED Final   Escherichia coli DETECTED (A) NOT DETECTED Final    Comment: CRITICAL RESULT CALLED TO, READ BACK BY AND VERIFIED WITH: DAVID BESANTI AT 0330 ON 11/06/18 RWW    Klebsiella oxytoca NOT DETECTED NOT DETECTED Final   Klebsiella pneumoniae NOT DETECTED NOT DETECTED Final   Proteus species NOT DETECTED NOT  DETECTED Final   Serratia marcescens NOT DETECTED NOT DETECTED Final   Carbapenem resistance NOT DETECTED NOT DETECTED Final   Haemophilus influenzae NOT DETECTED NOT DETECTED Final   Neisseria meningitidis NOT DETECTED NOT DETECTED Final   Pseudomonas aeruginosa NOT DETECTED NOT DETECTED Final   Candida albicans NOT DETECTED NOT DETECTED Final   Candida glabrata NOT DETECTED NOT DETECTED Final   Candida krusei NOT DETECTED NOT DETECTED Final   Candida parapsilosis NOT DETECTED NOT DETECTED Final   Candida tropicalis NOT DETECTED NOT DETECTED Final    Comment: Performed at [facility], [address] [place], [place] [address]  Novel Coronavirus, NAA (hospital order; send-out to ref lab)     Status: None   Collection Time: 11/12/2018  9:25 AM  Result Value Ref Range Status   SARS-CoV-2, NAA NOT DETECTED NOT DETECTED Final    Comment: Negative (Not Detected) results do not exclude infection caused by SARS CoV 2 and should not be used as the sole basis for treatment or other patient management decisions. Optimum specimen types and timing for peak viral levels during infections caused  by SARS CoV 2 have not been determined. Collection of multiple specimens (types and time points) from the same patient may be necessary to detect the virus. Improper specimen collection and handling, sequence variability underlying assay primers and or probes, or the presence of organisms in  quantities less than the limit of detection of the assay may lead to false negative results. Positive and negative predictive values of testing are highly dependent on prevalence. False negative results are more likely when prevalence of disease is high. Performed at [facility] (NOTE) The expected result is Negative (Not Detected). The SARS CoV 2 test is intended for the presumptive qualitative  detection of nucleic acid from SARS C oV 2 in upper and lower  respiratory specimens. Testing methodology is real time RT PCR. Test results must be correlated with clinical presentation and  evaluated in the context of other laboratory and epidemiologic data.  Test performance can be affected because the epidemiology and  clinical spectrum of infection caused by SARS CoV 2 is not fully  known. For example, the optimum types of specimens to collect and  when during the course of infection these specimens are most likely  to contain detectable viral RNA may not be known. This test has not been Food and Drug Administration (FDA) cleared or  approved and has been authorized by FDA under an Emergency Use   Authorization (EUA). The test is only authorized for the duration of  the declaration that circumstances exist justifying the authorization  of emergency use of in vitro diagnostic tests for detection and or  diagnosis of SARS CoV 2 under Section 564(b)(1) of the Act, 21 U.S.C.  section [phone] 3(b)(1), unless th e authorization is terminated or  revoked sooner. [facility] is certified under the  Clinical Laboratory Improvement Amendments of 1988 (CLIA), 42 U.S.C.  section [phone], to perform high complexity tests. Performed at [facility] CLIA [id number] [address], [place], [place] [address] Laboratory Director: Turner Daniels, MD Fact Sheet for Healthcare Providers  https://pope.com/ Fact Sheet for Patients  [email or web address] Performed at [facility], [address], [place], [place] [address]    Coronavirus Source NASOPHARYNGEAL  Final    Comment: Performed at [facility], [address], [place], [place] [address]  Blood Culture ID Panel (Reflexed)  Status: Abnormal   Collection Time: 11/16/2018  9:34 AM  Result Value Ref Range Status   Enterococcus species NOT DETECTED NOT DETECTED Final   Listeria monocytogenes NOT DETECTED NOT DETECTED Final   Staphylococcus species DETECTED (A) NOT DETECTED Final    Comment: Methicillin (oxacillin) resistant coagulase negative staphylococcus. Possible blood culture contaminant (unless isolated from more than one blood culture draw or clinical case suggests pathogenicity). No antibiotic treatment is indicated for blood  culture contaminants. CRITICAL RESULT CALLED TO, READ BACK BY AND VERIFIED WITH: DAVID BESANTI AT 6578 11/06/2018.PMF    Staphylococcus aureus (BCID) NOT DETECTED NOT DETECTED Final   Methicillin resistance DETECTED (A) NOT DETECTED Final    Comment: CRITICAL RESULT CALLED TO, READ BACK BY AND VERIFIED  WITH: DAVID BESANTI AT 4696 11/06/2018.PMF    Streptococcus species NOT DETECTED NOT DETECTED Final   Streptococcus agalactiae NOT DETECTED NOT DETECTED Final   Streptococcus pneumoniae NOT DETECTED NOT DETECTED Final   Streptococcus pyogenes NOT DETECTED NOT DETECTED Final   Acinetobacter baumannii NOT DETECTED NOT DETECTED Final   Enterobacteriaceae species NOT DETECTED NOT DETECTED Final   Enterobacter cloacae complex NOT DETECTED NOT DETECTED Final   Escherichia coli NOT DETECTED NOT DETECTED Final   Klebsiella oxytoca NOT DETECTED NOT DETECTED Final   Klebsiella pneumoniae NOT DETECTED NOT DETECTED Final   Proteus species NOT DETECTED NOT DETECTED Final   Serratia marcescens NOT DETECTED NOT DETECTED Final   Haemophilus influenzae NOT DETECTED NOT DETECTED Final   Neisseria meningitidis NOT DETECTED NOT DETECTED Final   Pseudomonas aeruginosa NOT DETECTED NOT DETECTED Final   Candida albicans NOT DETECTED NOT DETECTED Final   Candida glabrata NOT DETECTED NOT DETECTED Final   Candida krusei NOT DETECTED NOT DETECTED Final   Candida parapsilosis NOT DETECTED NOT DETECTED Final   Candida tropicalis NOT DETECTED NOT DETECTED Final    Comment: Performed at [facility], [address] [place], [place] [address]  Urine culture     Status: None   Collection Time: 11/21/2018 10:10 AM  Result Value Ref Range Status   Specimen Description   Final    URINE, RANDOM Performed at [facility], [address] [place], [place] [address]    Special Requests   Final    NONE Performed at [facility], [address], [place], [place] [address]    Culture   Final    NO GROWTH Performed at [facility], [address] [place], [place] [address]    Report Status 11/06/2018 FINAL  Final  MRSA PCR Screening     Status: None   Collection Time: 11/07/2018  1:06 PM  Result Value Ref Range Status   MRSA by PCR NEGATIVE NEGATIVE Final    Comment:        The  GeneXpert MRSA Assay (FDA approved for NASAL specimens only), is one component of a comprehensive MRSA colonization surveillance program. It is not intended to diagnose MRSA infection nor to guide or monitor treatment for MRSA infections. Performed at [facility], [address] [place], [place] [address]   Culture, respiratory (non-expectorated)     Status: None   Collection Time: 11/04/2018  3:48 PM  Result Value Ref Range Status   Specimen Description   Final    TRACHEAL ASPIRATE Performed at [facility], [address] [place] [address], [place], [place] [address]    Special Requests   Final    NONE Performed at [facility], [address] [place], [place] [address]    Gram Stain   Final    RARE WBC PRESENT, PREDOMINANTLY MONONUCLEAR FEW GRAM POSITIVE COCCI IN CLUSTERS FEW GRAM POSITIVE RODS RARE YEAST    Culture   Final    FEW Consistent with normal respiratory flora. Performed at [facility], [address] [place], [place] [address]    Report Status 11/08/2018 FINAL  Final    Lab Basic Metabolic Panel: Recent Labs  Lab 11/07/18 0408 11/07/18 0431 11/08/18 0513 11/09/18 0318 11/10/18 0302 11/11/18 0630  NA 146*  --  145 148* 142 141  K 4.0  --  4.5 5.0 4.9 4.8  CL 115*  --  111 112* 104 102  CO2 24  --  25 27 29 27   GLUCOSE 172*  --  172* 173* 162* 127*  BUN 18  --  31* 53* 57* 40*  CREATININE 0.82  --  1.09* 0.88 0.84 0.80  CALCIUM 7.1*  --  7.4* 8.2* 8.3* 8.0*  MG  --  2.4 2.6*  --   --   --   PHOS  --  2.4* 4.7*  --   --   --    Liver Function Tests: Recent Labs  Lab 11/08/18 0513 11/09/18 0318 11/10/18 0302  AST 14* 17 50*  ALT 21 25 71*  ALKPHOS 43 41 45  BILITOT 0.5 0.4 0.6  PROT 5.3* 5.6* 5.6*  ALBUMIN 2.5* 2.9* 2.9*   No results for input(s): LIPASE, AMYLASE in the last 168 hours. No results for input(s): AMMONIA in the last 168 hours. CBC: Recent Labs  Lab 11/07/18 0408 11/08/18 0513 11/09/18 0318  11/10/18 0302 11/11/18 0630  WBC 8.9 7.6 8.5 8.9 10.1  HGB 10.6* 9.3* 10.8* 11.2* 13.2  HCT 35.8* 31.7* 37.7 37.2 40.9  MCV 96.2 97.5 98.4 93.9 89.5  PLT 118* 134* 141* 130* 160   Cardiac Enzymes: No results for input(s): CKTOTAL, CKMB, CKMBINDEX, TROPONINI in the last 168 hours. Sepsis Labs: Recent Labs  Lab 11/07/18 0408 11/08/18 0513 11/09/18 0318 11/10/18 0302 11/11/18 0630  PROCALCITON 27.10 19.35  --   --  1.88  WBC 8.9 7.6 8.5 8.9 10.1    Procedures/Operations  Intubation, central line   Traniya Prichett R Javell Blackburn 10/26/2018, 10:28 AM

## 2018-11-25 NOTE — Progress Notes (Signed)
   [facility] [place] at [facility]   PATIENT NAME: [name] [name]    MR#:  [id number]  DATE OF BIRTH:  July 17, 1946  SUBJECTIVE:    On morphine drip. IN some resp distress  REVIEW OF SYSTEMS:  Review of Systems  Unable to perform ROS: Mental status change    DRUG ALLERGIES:   Allergies  Allergen Reactions  . Sulfa Antibiotics Itching    Pt states she gets "itching inside and out"  . Cymbalta [Duloxetine Hcl] Nausea Only    Not allergic  . Tramadol Hcl Other (See Comments)    Not allergic   VITALS:  Blood pressure (!) 76/57, pulse 75, temperature 98.4 F (36.9 C), temperature source Axillary, resp. rate (!) 27, height 5\' 5"  (1.651 m), weight 112.9 kg, SpO2 92 %. PHYSICAL EXAMINATION:  Physical Exam  GENERAL:  Laying in the bed with shallow breathing  LABORATORY PANEL:  Female CBC Recent Labs  Lab 11/11/18 0630  WBC 10.1  HGB 13.2  HCT 40.9  PLT 160   ------------------------------------------------------------------------------------------------------------------ Chemistries  Recent Labs  Lab 11/08/18 0513  11/10/18 0302 11/11/18 0630  NA 145   < > 142 141  K 4.5   < > 4.9 4.8  CL 111   < > 104 102  CO2 25   < > 29 27  GLUCOSE 172*   < > 162* 127*  BUN 31*   < > 57* 40*  CREATININE 1.09*   < > 0.84 0.80  CALCIUM 7.4*   < > 8.3* 8.0*  MG 2.6*  --   --   --   AST 14*   < > 50*  --   ALT 21   < > 71*  --   ALKPHOS 43   < > 45  --   BILITOT 0.5   < > 0.6  --    < > = values in this interval not displayed.   RADIOLOGY:  No results found. ASSESSMENT AND PLAN:   ESBL E. coli bacteremia- pneumonia is likely source.  Urine culture with no growth. Acute on chronic hypoxemic hypercapnic respiratory failure-likely due to COPD exacerbation and pneumonia -COVID testing negative Type 2 diabetes History of severe mitral regurgitation and non-ischemic cardiomyopathy  On comfort measures. Will need extra morphine and ativan.  All the records  are reviewed and case discussed with Care Management/Social Worker. Management plans discussed with the patient, family and they are in agreement.  CODE STATUS: DNR  TOTAL TIME TAKING CARE OF THIS PATIENT: 25 minutes.   Orie Fisherman M.D on 11-25-18 at 10:27 AM  Between 7am to 6pm - Pager - [phone]  After 6pm go to [email or web address] - Social research officer, government  [facility] [place] [facility]  Office  [phone]  CC: Primary care physician; Barbette Reichmann, MD  Note: This dictation was prepared with Dragon dictation along with smaller phrase technology. Any transcriptional errors that result from this process are unintentional.

## 2018-11-25 NOTE — Progress Notes (Signed)
Pt has become increasingly awake, breathing distress has increased. Pt has very loud mucus sounding breaths at 30/min Pt is sweating and appears to be struggling to breathe. Boluses of Morphine given as well as ativan and glycopyrrolate to help decrease distress. Pt breathing has decreased to 26/hr with less distress noted. Will continue to monitor.

## 2018-11-25 NOTE — Progress Notes (Addendum)
Katie CNA called RN to room. NO BP, P, R. Pt pronouced expired at 0910. Dr. Elpidio Anis and Elnita Maxwell, house supervisor notified. Erlinda Hong RN was second Training and development officer of expiration. TC with Rolin Barry, family contact and notified of pt death. Family coming to view body. Post mortem care performed with body ready to view. Will await family arrival to complete care.

## 2018-11-25 NOTE — Progress Notes (Signed)
Family and ready for body to be released. Funeral home form completed. Support given to son and other family member. Declined chaplain need. Body ready to transport to morgue.

## 2018-11-25 DEATH — deceased

## 2018-12-12 IMAGING — CR DG CHEST 2V
2 series · 2 of 2 positions shown · non-contrast
Comparison: [id number] [id number]

CLINICAL DATA: Cough for 2-3 days

EXAM:
CHEST  2 VIEW

[chest pa]
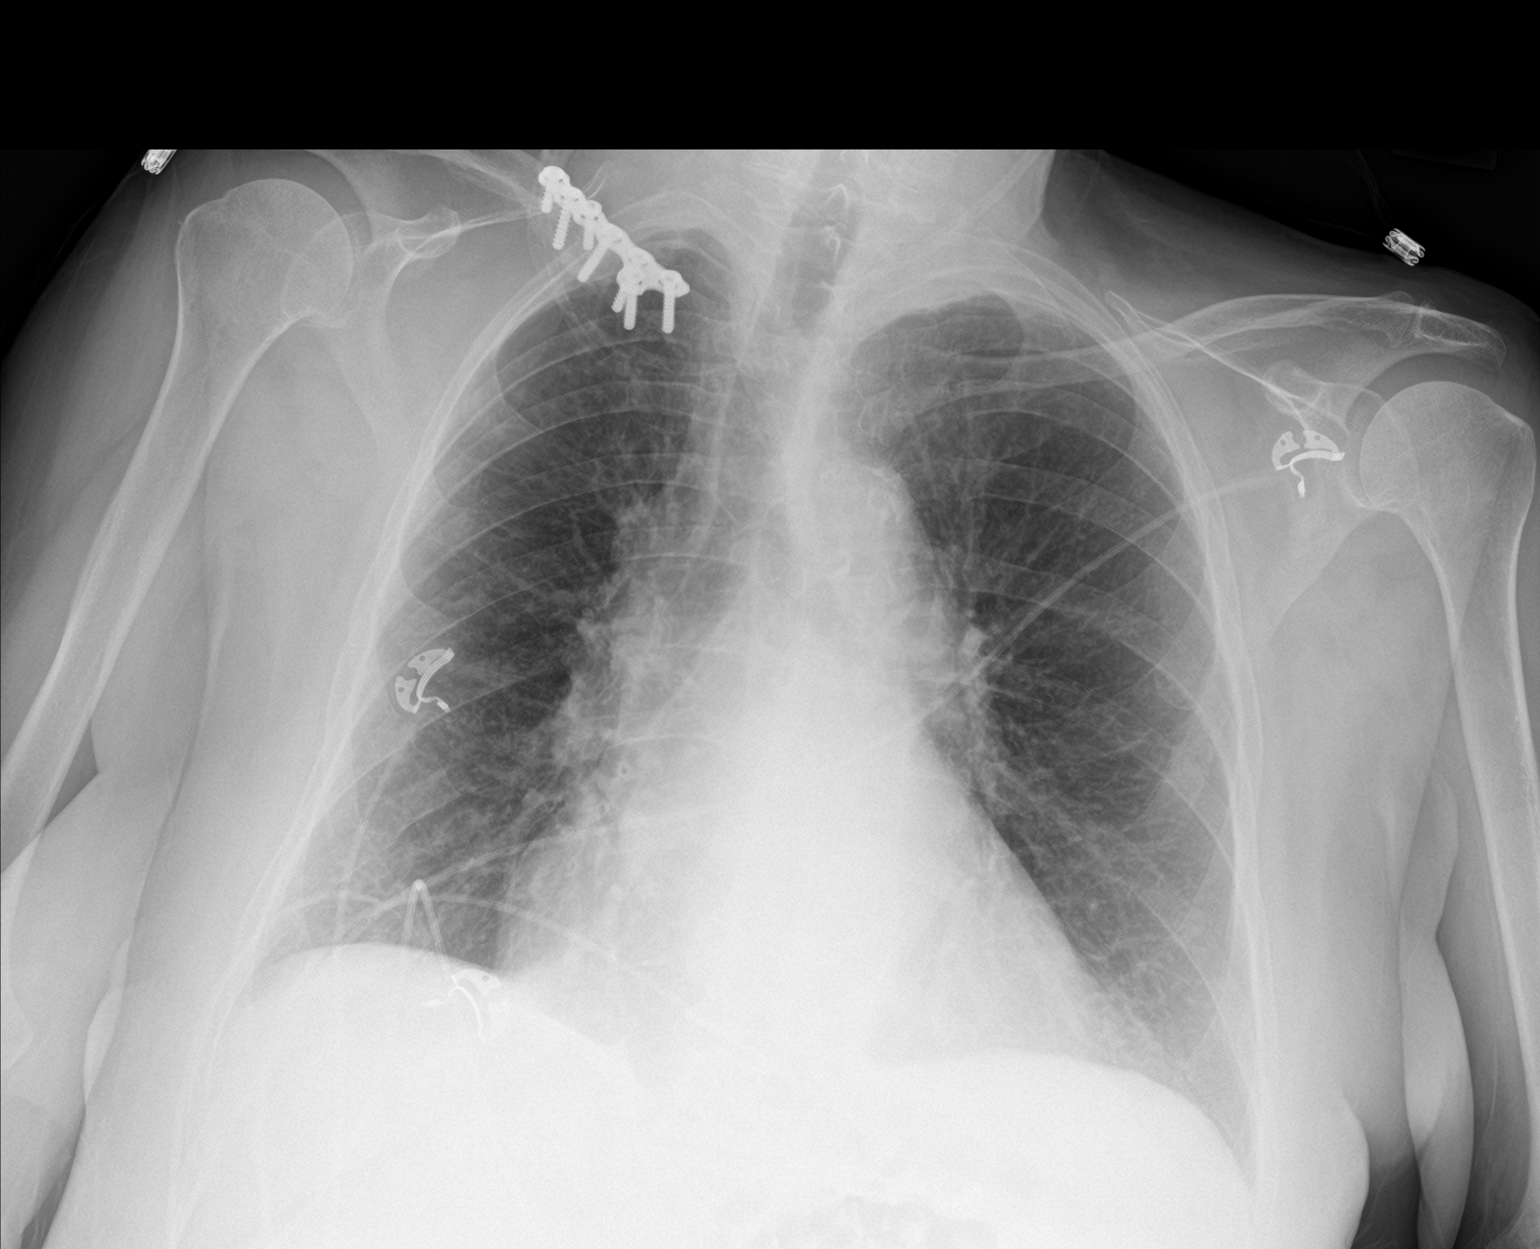

[chest lat]
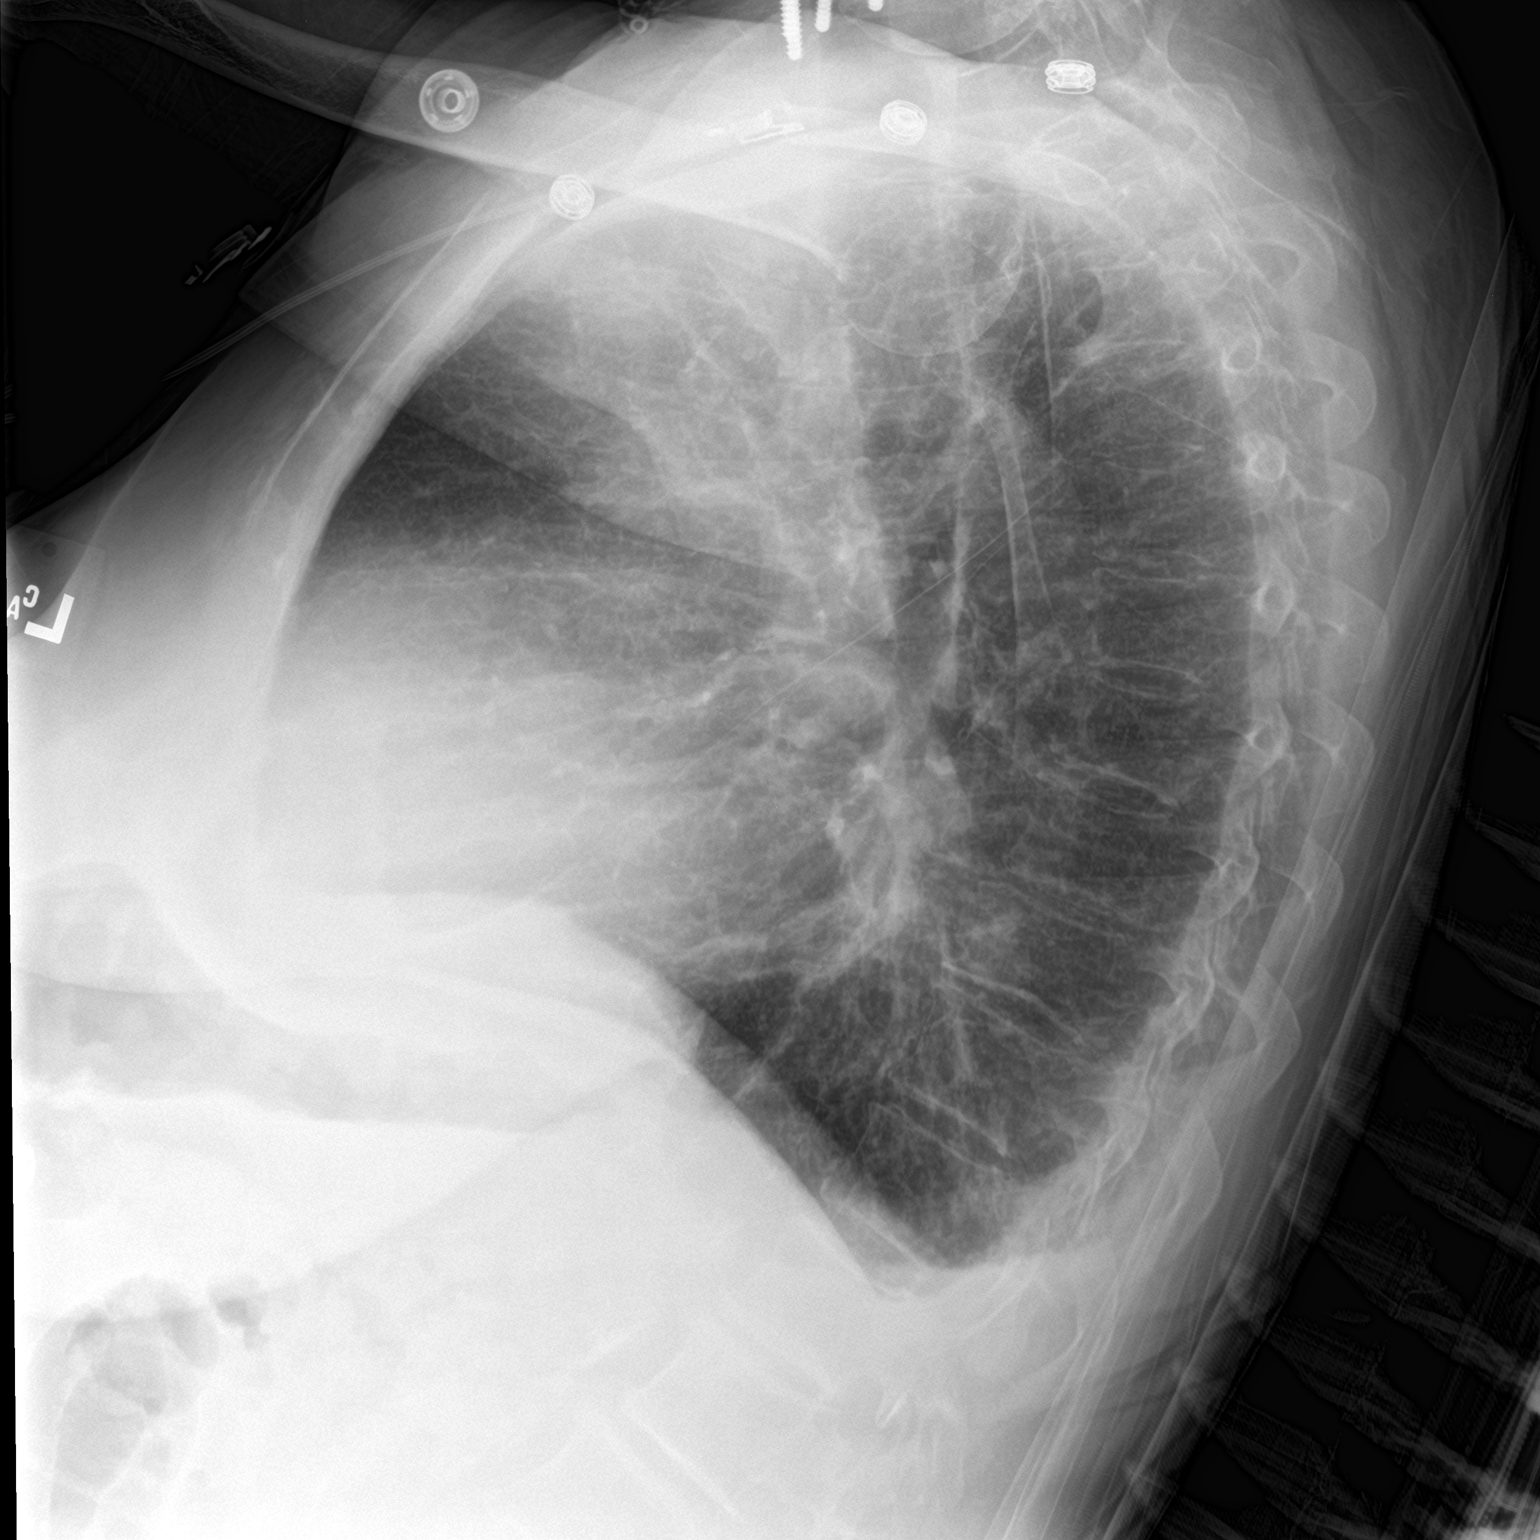

[2 of 2 positions shown; findings below may reference images not displayed]

FINDINGS: Mild cardiomegaly and vascular congestion. Small bilateral
effusions. No overt edema or confluent airspace opacities. No acute
bony abnormality.
IMPRESSION: Cardiomegaly, vascular congestion.

Small bilateral effusions.

## 2019-04-10 IMAGING — CR DG SHOULDER 2+V*L*
4 series · 4 of 4 positions shown · non-contrast
Comparison: None.

CLINICAL DATA: Left shoulder pain

EXAM:
LEFT SHOULDER - 2+ VIEW

[shoulder grashey]
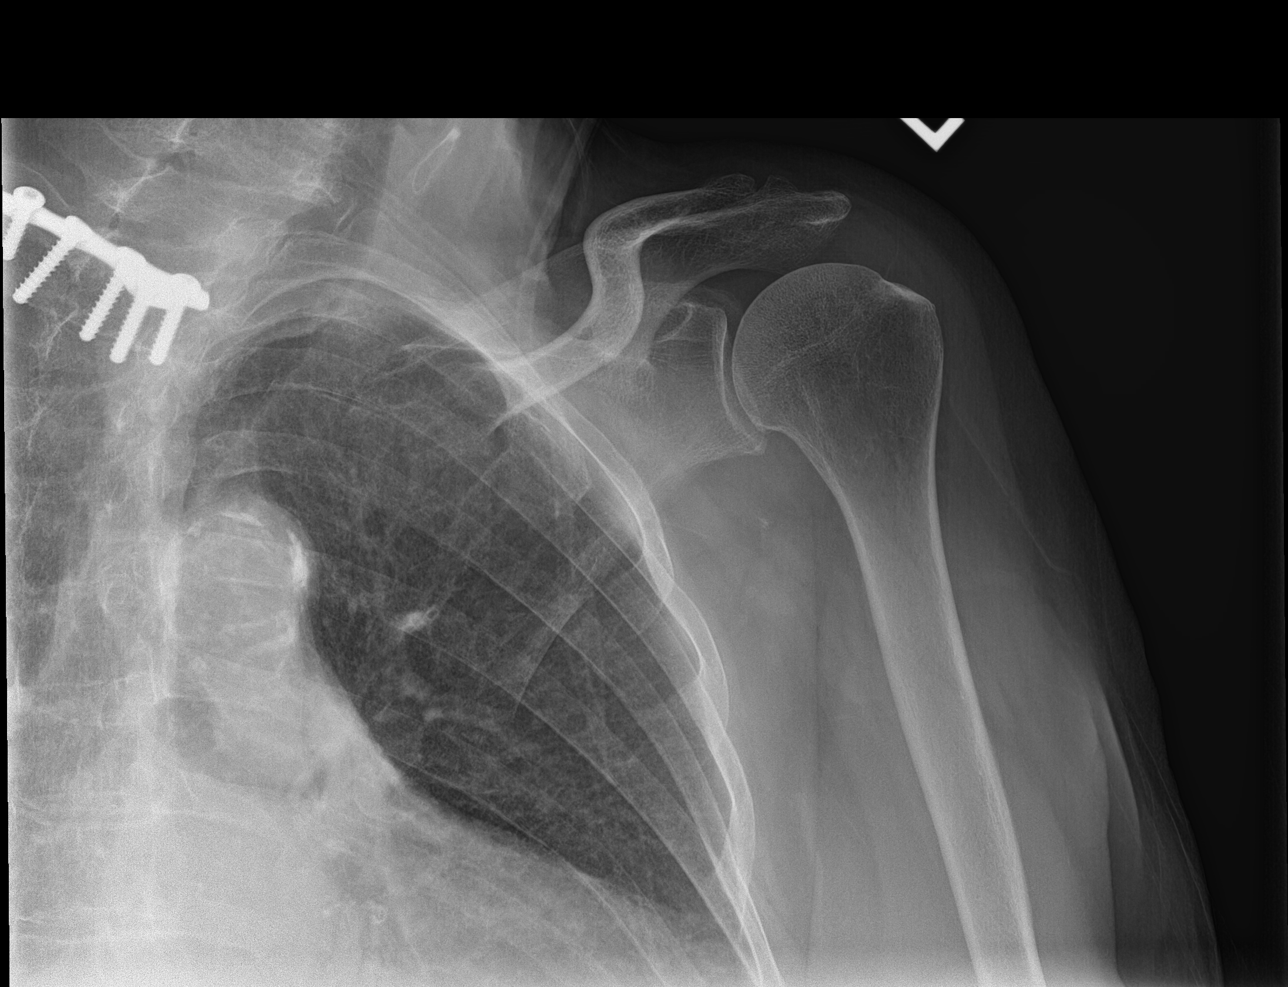

[shoulder y view]
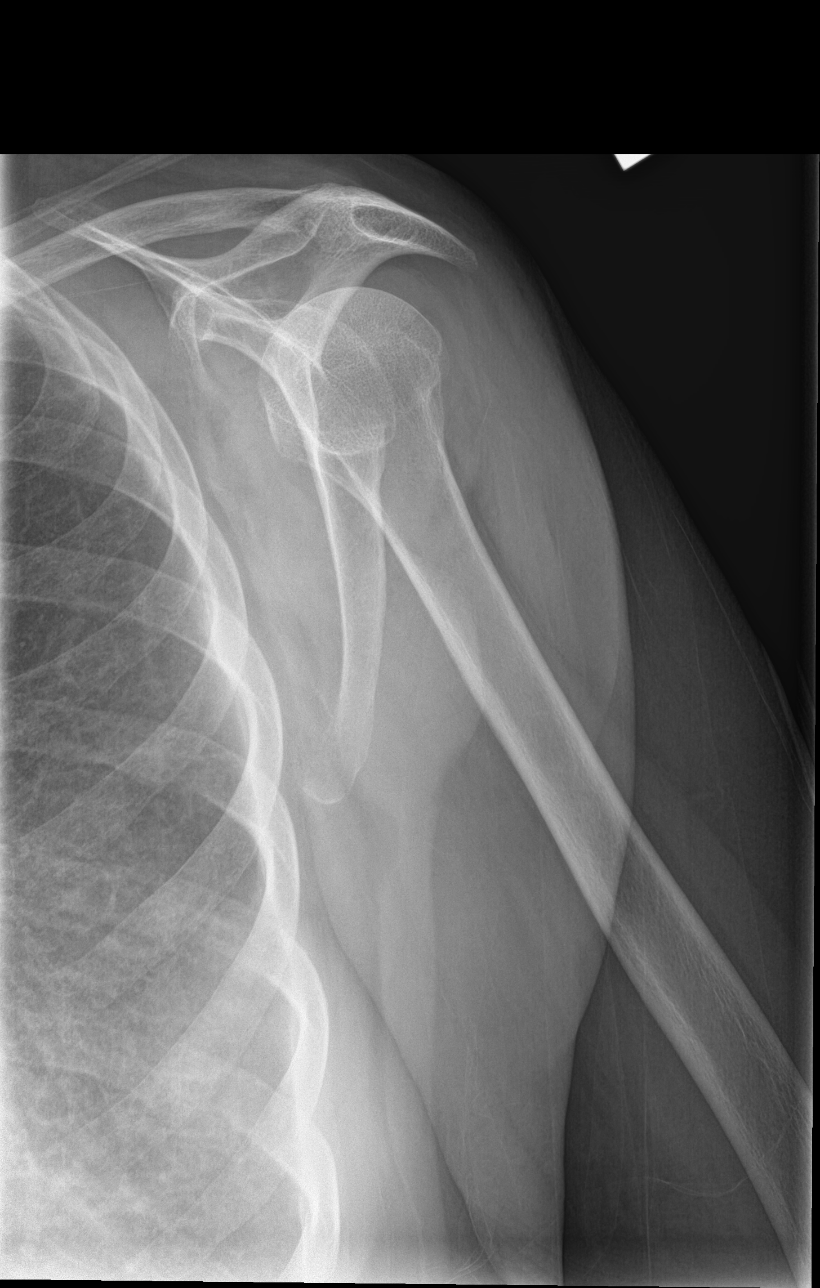

[shoulder axial (1 of 2)]
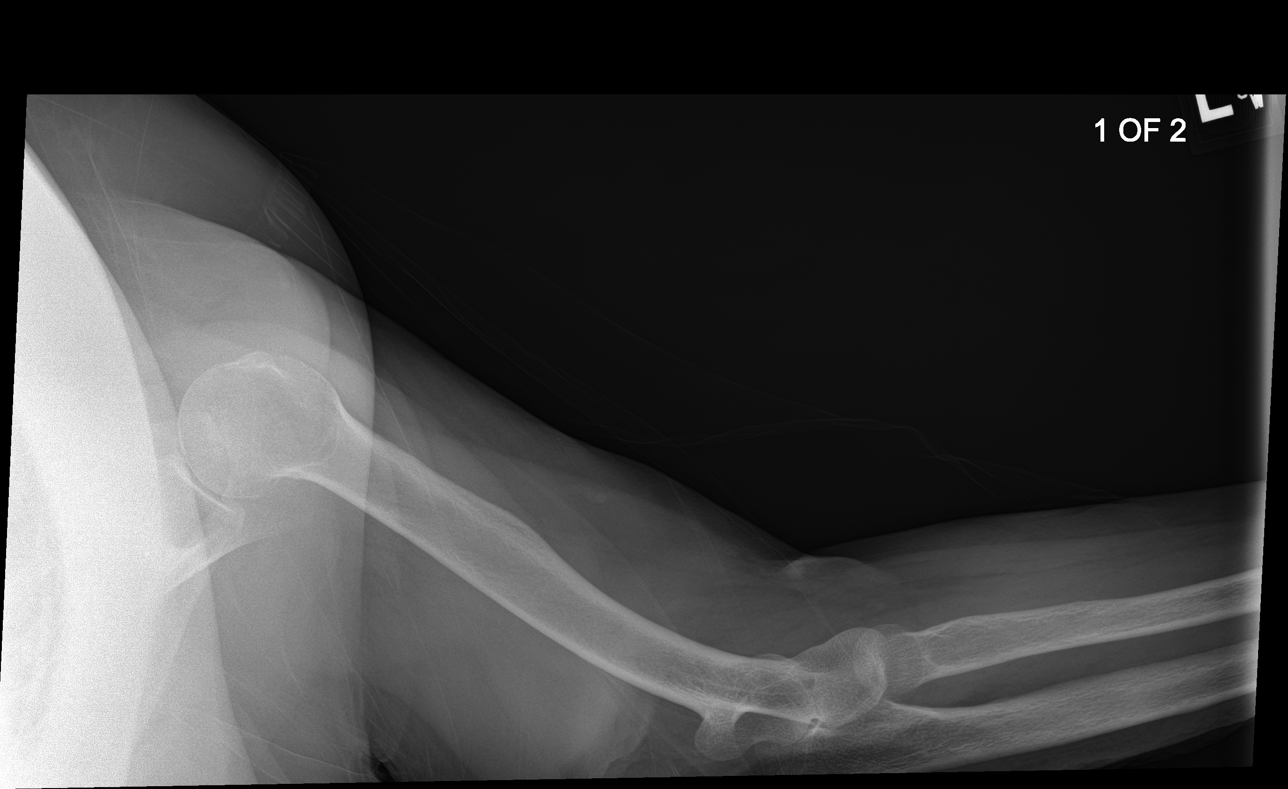

[shoulder axial (2 of 2)]
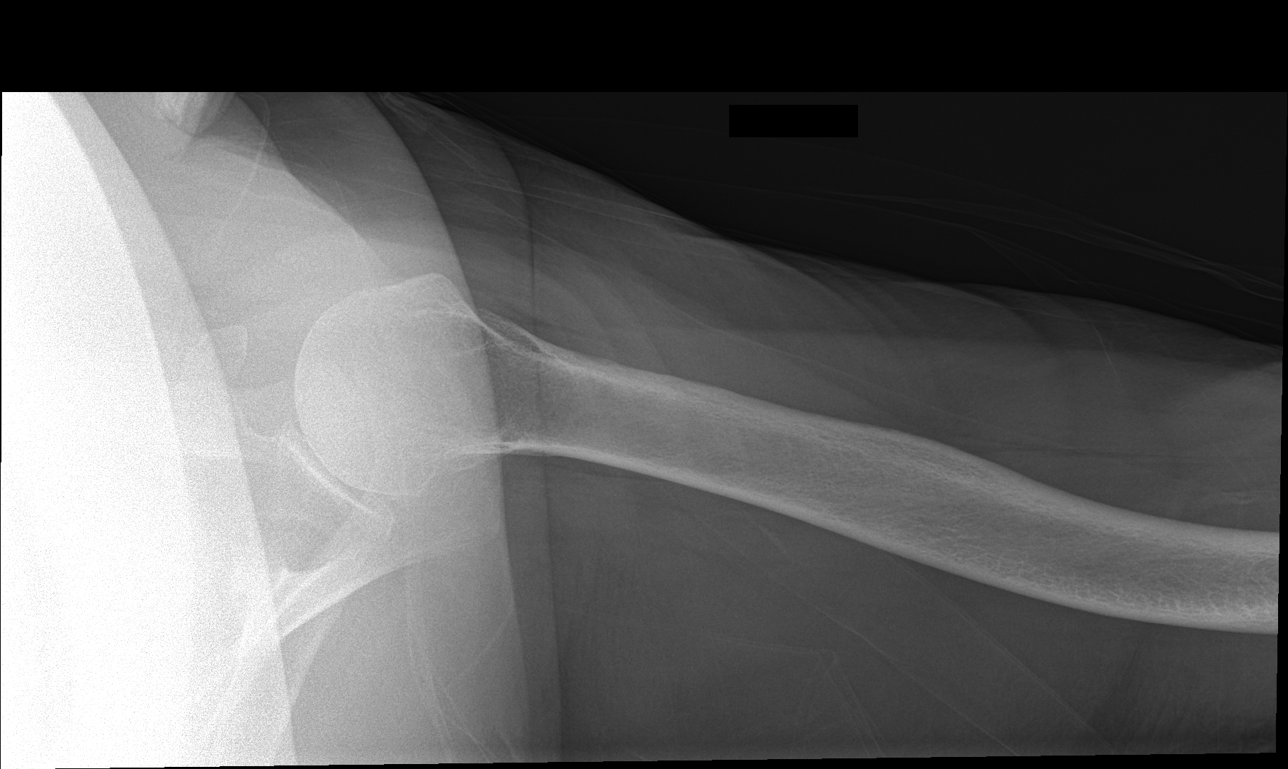

[4 of 4 positions shown; findings below may reference images not displayed]

FINDINGS: Degenerative changes in the AC joint with joint space narrowing and
spurring. Glenohumeral joint is maintained. No acute bony
abnormality. Specifically, no fracture, subluxation, or dislocation.
Soft tissues are intact.
IMPRESSION: Degenerate changes in the left AC joint.  No acute bony abnormality.

## 2019-12-29 IMAGING — CR DG CHEST 2V
1 series · 3 of 3 positions shown · non-contrast
Comparison: 12/29/2017

CLINICAL DATA: Shortness of breath

EXAM:
CHEST - 2 VIEW

[Series 1: dg chest 2 view · 0.14mm/px · 3 of 3 slices shown]
[im 1/3]
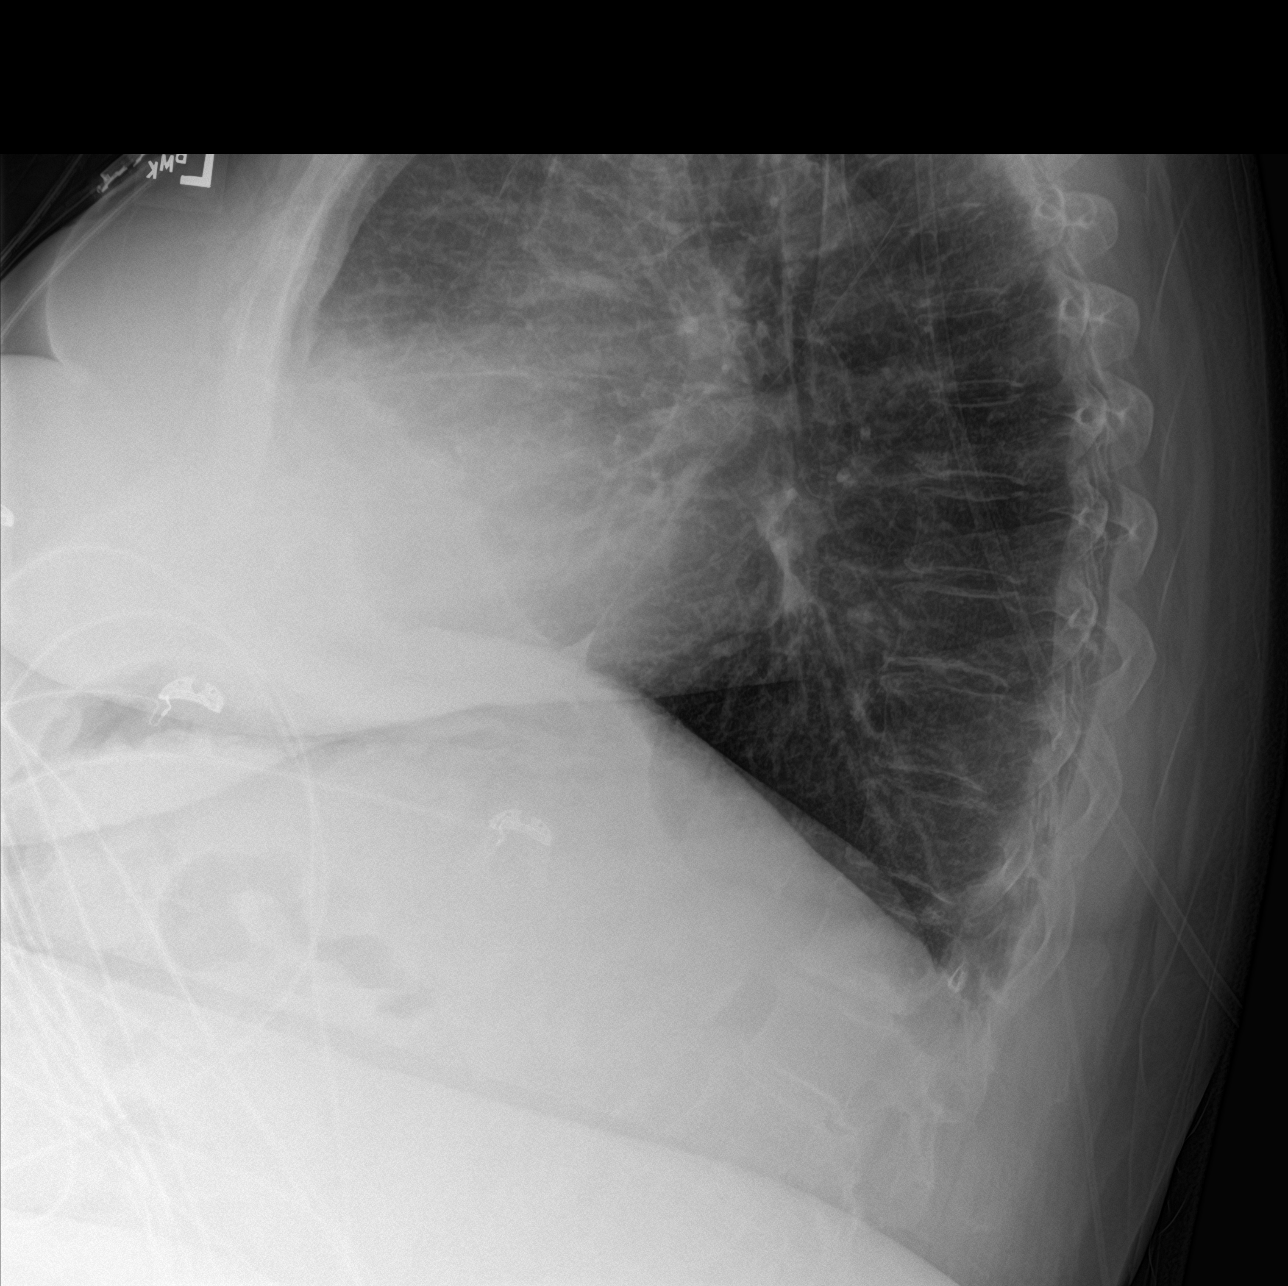
[im 2/3]
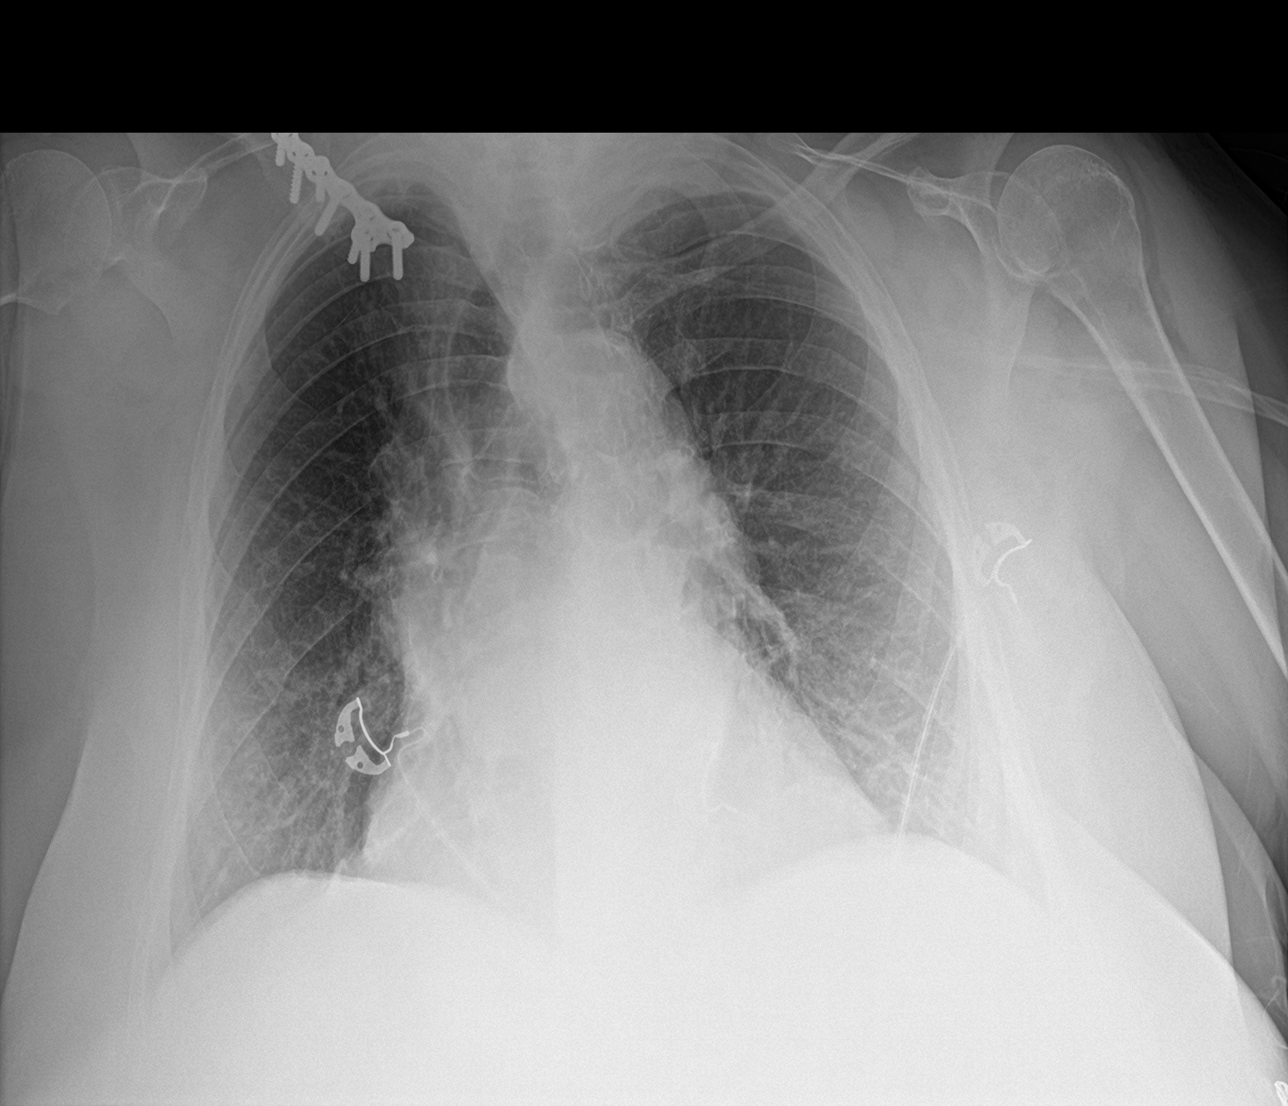
[im 3/3]
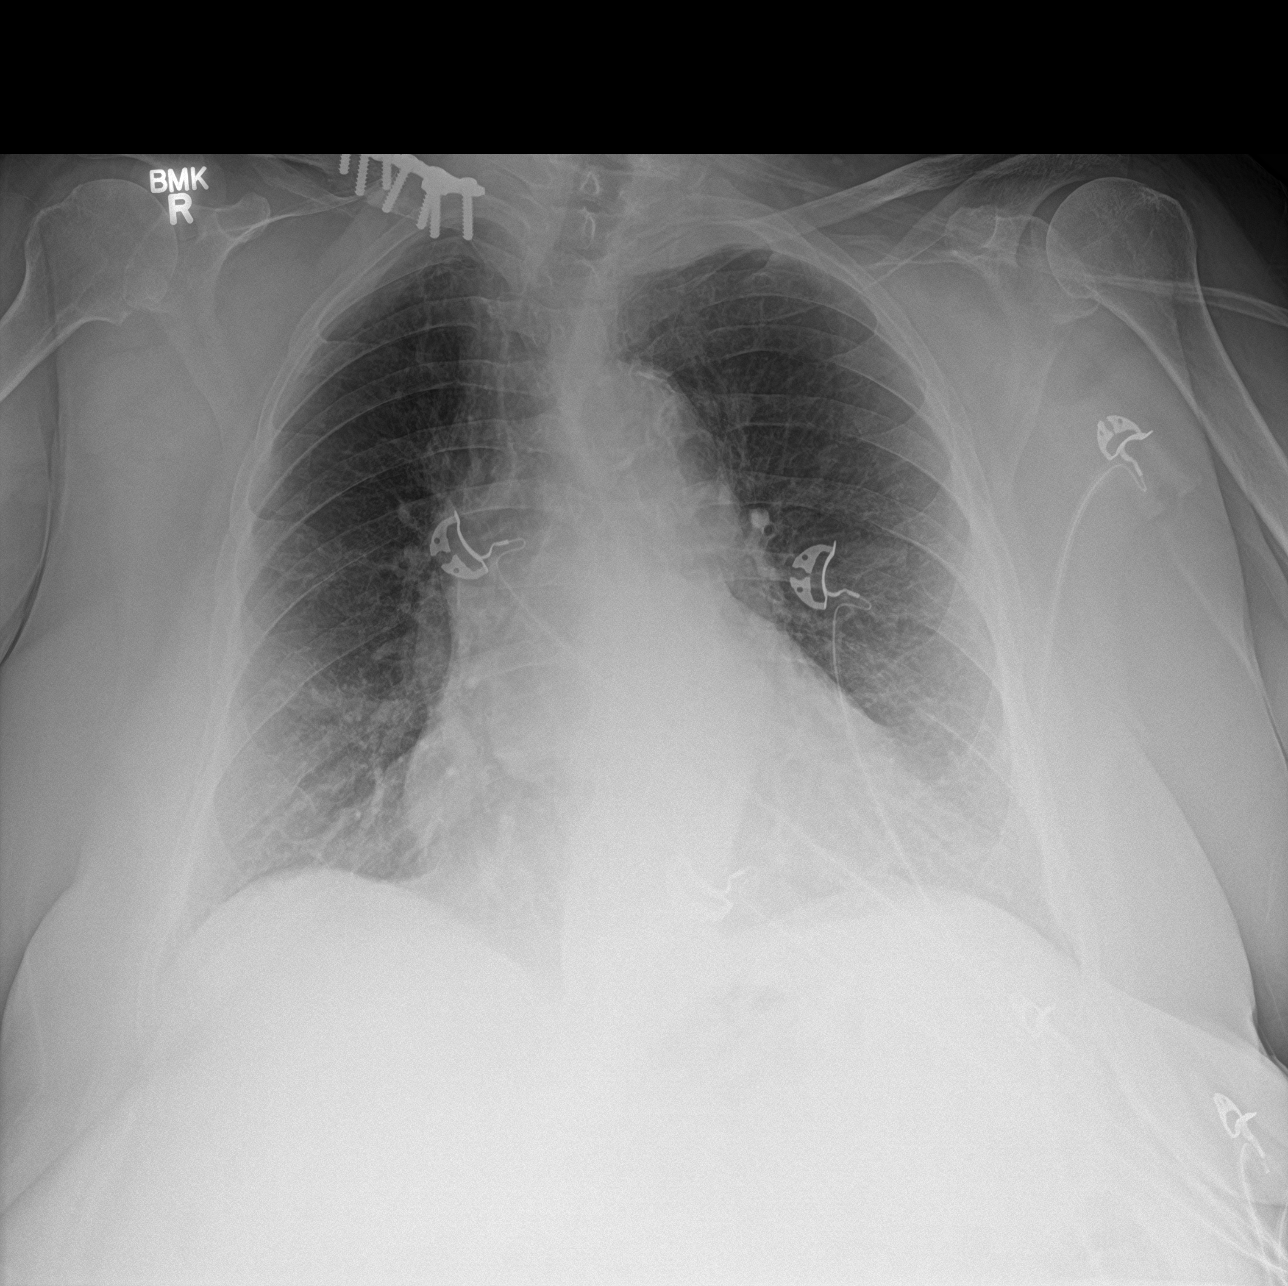

[3 of 3 positions shown; findings below may reference images not displayed]

FINDINGS: Surgical plate and screw fixation of the right clavicle. No focal
airspace disease. No pleural effusion. Mild cardiomegaly with aortic
atherosclerosis. No pneumothorax.
IMPRESSION: No active cardiopulmonary disease.  Mild cardiomegaly.
